# Patient Record
Sex: Male | Born: 1955 | Race: White | Hispanic: No | Marital: Married | Smoking: Current every day smoker
Health system: Southern US, Community
[De-identification: ages and names within clinical notes are randomized; demographics above are authoritative.]

## PROBLEM LIST (undated history)

## (undated) DIAGNOSIS — Z86010 Personal history of colonic polyps: Secondary | ICD-10-CM

## (undated) DIAGNOSIS — G4700 Insomnia, unspecified: Secondary | ICD-10-CM

## (undated) DIAGNOSIS — N138 Other obstructive and reflux uropathy: Secondary | ICD-10-CM

## (undated) DIAGNOSIS — R011 Cardiac murmur, unspecified: Secondary | ICD-10-CM

## (undated) DIAGNOSIS — R9389 Abnormal findings on diagnostic imaging of other specified body structures: Secondary | ICD-10-CM

## (undated) DIAGNOSIS — G8929 Other chronic pain: Secondary | ICD-10-CM

## (undated) DIAGNOSIS — E538 Deficiency of other specified B group vitamins: Secondary | ICD-10-CM

## (undated) DIAGNOSIS — R030 Elevated blood-pressure reading, without diagnosis of hypertension: Secondary | ICD-10-CM

## (undated) DIAGNOSIS — M47816 Spondylosis without myelopathy or radiculopathy, lumbar region: Secondary | ICD-10-CM

## (undated) DIAGNOSIS — R7303 Prediabetes: Secondary | ICD-10-CM

## (undated) DIAGNOSIS — Z720 Tobacco use: Secondary | ICD-10-CM

## (undated) DIAGNOSIS — K5792 Diverticulitis of intestine, part unspecified, without perforation or abscess without bleeding: Secondary | ICD-10-CM

## (undated) DIAGNOSIS — I77810 Thoracic aortic ectasia: Secondary | ICD-10-CM

## (undated) DIAGNOSIS — M1990 Unspecified osteoarthritis, unspecified site: Secondary | ICD-10-CM

## (undated) DIAGNOSIS — T7840XA Allergy, unspecified, initial encounter: Secondary | ICD-10-CM

## (undated) DIAGNOSIS — C4490 Unspecified malignant neoplasm of skin, unspecified: Secondary | ICD-10-CM

## (undated) DIAGNOSIS — E782 Mixed hyperlipidemia: Secondary | ICD-10-CM

## (undated) DIAGNOSIS — I350 Nonrheumatic aortic (valve) stenosis: Secondary | ICD-10-CM

## (undated) DIAGNOSIS — Q231 Congenital insufficiency of aortic valve: Secondary | ICD-10-CM

## (undated) DIAGNOSIS — J449 Chronic obstructive pulmonary disease, unspecified: Secondary | ICD-10-CM

## (undated) DIAGNOSIS — E7800 Pure hypercholesterolemia, unspecified: Secondary | ICD-10-CM

## (undated) DIAGNOSIS — N529 Male erectile dysfunction, unspecified: Secondary | ICD-10-CM

## (undated) DIAGNOSIS — Q2381 Bicuspid aortic valve: Secondary | ICD-10-CM

## (undated) HISTORY — DX: Spondylosis without myelopathy or radiculopathy, lumbar region: M47.816

## (undated) HISTORY — DX: Thoracic aortic ectasia: I77.810

## (undated) HISTORY — DX: Other obstructive and reflux uropathy: N13.8

## (undated) HISTORY — DX: Prediabetes: R73.03

## (undated) HISTORY — DX: Benign prostatic hyperplasia with lower urinary tract symptoms: N13.8

## (undated) HISTORY — DX: Bicuspid aortic valve: Q23.81

## (undated) HISTORY — PX: TONSILLECTOMY: SUR1361

## (undated) HISTORY — DX: Cardiac murmur, unspecified: R01.1

## (undated) HISTORY — DX: Diverticulitis of intestine, part unspecified, without perforation or abscess without bleeding: K57.92

## (undated) HISTORY — DX: Male erectile dysfunction, unspecified: N52.9

## (undated) HISTORY — DX: Abnormal findings on diagnostic imaging of other specified body structures: R93.89

## (undated) HISTORY — DX: Pure hypercholesterolemia, unspecified: E78.00

## (undated) HISTORY — DX: Tobacco use: Z72.0

## (undated) HISTORY — DX: Mixed hyperlipidemia: E78.2

## (undated) HISTORY — DX: Elevated blood-pressure reading, without diagnosis of hypertension: R03.0

## (undated) HISTORY — DX: Nonrheumatic aortic (valve) stenosis: I35.0

## (undated) HISTORY — DX: Other chronic pain: G89.29

## (undated) HISTORY — DX: Allergy, unspecified, initial encounter: T78.40XA

## (undated) HISTORY — DX: Deficiency of other specified B group vitamins: E53.8

## (undated) HISTORY — DX: Personal history of colonic polyps: Z86.010

## (undated) HISTORY — DX: Unspecified osteoarthritis, unspecified site: M19.90

## (undated) HISTORY — PX: HEMORROIDECTOMY: SUR656

## (undated) HISTORY — DX: Chronic obstructive pulmonary disease, unspecified: J44.9

## (undated) HISTORY — DX: Congenital insufficiency of aortic valve: Q23.1

## (undated) HISTORY — PX: RESECTION DISTAL CLAVICAL: SHX5053

## (undated) HISTORY — DX: Unspecified malignant neoplasm of skin, unspecified: C44.90

## (undated) HISTORY — DX: Insomnia, unspecified: G47.00

---

## 1999-01-25 ENCOUNTER — Encounter: Payer: Self-pay | Admitting: Gastroenterology

## 1999-01-25 ENCOUNTER — Ambulatory Visit (HOSPITAL_COMMUNITY): Admission: RE | Admit: 1999-01-25 | Discharge: 1999-01-25 | Payer: Self-pay | Admitting: Gastroenterology

## 1999-04-12 ENCOUNTER — Ambulatory Visit (HOSPITAL_COMMUNITY): Admission: RE | Admit: 1999-04-12 | Discharge: 1999-04-12 | Payer: Self-pay | Admitting: Gastroenterology

## 1999-07-26 ENCOUNTER — Ambulatory Visit (HOSPITAL_COMMUNITY): Admission: RE | Admit: 1999-07-26 | Discharge: 1999-07-26 | Payer: Self-pay | Admitting: *Deleted

## 2001-02-05 ENCOUNTER — Ambulatory Visit (HOSPITAL_COMMUNITY): Admission: RE | Admit: 2001-02-05 | Discharge: 2001-02-05 | Payer: Self-pay | Admitting: Internal Medicine

## 2001-02-05 ENCOUNTER — Encounter: Payer: Self-pay | Admitting: Internal Medicine

## 2002-02-11 ENCOUNTER — Encounter: Payer: Self-pay | Admitting: Internal Medicine

## 2002-02-11 ENCOUNTER — Ambulatory Visit (HOSPITAL_COMMUNITY): Admission: RE | Admit: 2002-02-11 | Discharge: 2002-02-11 | Payer: Self-pay | Admitting: Internal Medicine

## 2003-02-17 ENCOUNTER — Encounter: Payer: Self-pay | Admitting: Internal Medicine

## 2003-02-17 ENCOUNTER — Ambulatory Visit (HOSPITAL_COMMUNITY): Admission: RE | Admit: 2003-02-17 | Discharge: 2003-02-17 | Payer: Self-pay | Admitting: Internal Medicine

## 2003-02-27 ENCOUNTER — Ambulatory Visit (HOSPITAL_BASED_OUTPATIENT_CLINIC_OR_DEPARTMENT_OTHER): Admission: RE | Admit: 2003-02-27 | Discharge: 2003-02-27 | Payer: Self-pay | Admitting: General Surgery

## 2003-02-27 ENCOUNTER — Emergency Department (HOSPITAL_COMMUNITY): Admission: EM | Admit: 2003-02-27 | Discharge: 2003-02-27 | Payer: Self-pay | Admitting: Emergency Medicine

## 2003-02-27 ENCOUNTER — Encounter (INDEPENDENT_AMBULATORY_CARE_PROVIDER_SITE_OTHER): Payer: Self-pay | Admitting: Specialist

## 2004-03-01 ENCOUNTER — Ambulatory Visit (HOSPITAL_COMMUNITY): Admission: RE | Admit: 2004-03-01 | Discharge: 2004-03-01 | Payer: Self-pay | Admitting: Internal Medicine

## 2005-03-16 ENCOUNTER — Ambulatory Visit (HOSPITAL_COMMUNITY): Admission: RE | Admit: 2005-03-16 | Discharge: 2005-03-16 | Payer: Self-pay | Admitting: Internal Medicine

## 2005-12-19 LAB — HM COLONOSCOPY

## 2006-12-06 ENCOUNTER — Encounter: Admission: RE | Admit: 2006-12-06 | Discharge: 2006-12-06 | Payer: Self-pay | Admitting: Internal Medicine

## 2007-08-21 ENCOUNTER — Ambulatory Visit: Payer: Self-pay

## 2007-08-21 ENCOUNTER — Ambulatory Visit: Payer: Self-pay | Admitting: Cardiology

## 2008-04-04 ENCOUNTER — Encounter: Payer: Self-pay | Admitting: Cardiology

## 2010-03-11 ENCOUNTER — Encounter: Admission: RE | Admit: 2010-03-11 | Discharge: 2010-03-11 | Payer: Self-pay | Admitting: Gastroenterology

## 2010-04-22 ENCOUNTER — Encounter: Admission: RE | Admit: 2010-04-22 | Discharge: 2010-04-22 | Payer: Self-pay | Admitting: Gastroenterology

## 2010-05-10 ENCOUNTER — Ambulatory Visit (HOSPITAL_COMMUNITY): Admission: RE | Admit: 2010-05-10 | Discharge: 2010-05-10 | Payer: Self-pay | Admitting: Surgery

## 2010-12-03 ENCOUNTER — Ambulatory Visit (HOSPITAL_COMMUNITY)
Admission: RE | Admit: 2010-12-03 | Discharge: 2010-12-03 | Payer: Self-pay | Source: Home / Self Care | Attending: Internal Medicine | Admitting: Internal Medicine

## 2010-12-19 HISTORY — PX: COLON SURGERY: SHX602

## 2010-12-23 LAB — PROTIME-INR
INR: 0.96 (ref 0.00–1.49)
Prothrombin Time: 13 seconds (ref 11.6–15.2)

## 2010-12-23 LAB — URINALYSIS, ROUTINE W REFLEX MICROSCOPIC
Bilirubin Urine: NEGATIVE
Hemoglobin, Urine: NEGATIVE
Ketones, ur: NEGATIVE mg/dL
Nitrite: NEGATIVE
Protein, ur: NEGATIVE mg/dL
Specific Gravity, Urine: 1.027 (ref 1.005–1.030)
Urine Glucose, Fasting: NEGATIVE mg/dL
Urobilinogen, UA: 0.2 mg/dL (ref 0.0–1.0)
pH: 5.5 (ref 5.0–8.0)

## 2010-12-23 LAB — CBC
HCT: 46.6 % (ref 39.0–52.0)
Hemoglobin: 16.3 g/dL (ref 13.0–17.0)
MCH: 32.7 pg (ref 26.0–34.0)
MCHC: 35 g/dL (ref 30.0–36.0)
MCV: 93.6 fL (ref 78.0–100.0)
Platelets: 168 10*3/uL (ref 150–400)
RBC: 4.98 MIL/uL (ref 4.22–5.81)
RDW: 12.4 % (ref 11.5–15.5)
WBC: 5.3 10*3/uL (ref 4.0–10.5)

## 2010-12-23 LAB — COMPREHENSIVE METABOLIC PANEL
ALT: 23 U/L (ref 0–53)
AST: 20 U/L (ref 0–37)
Albumin: 3.9 g/dL (ref 3.5–5.2)
Alkaline Phosphatase: 61 U/L (ref 39–117)
BUN: 15 mg/dL (ref 6–23)
CO2: 30 mEq/L (ref 19–32)
Calcium: 9.7 mg/dL (ref 8.4–10.5)
Chloride: 105 mEq/L (ref 96–112)
Creatinine, Ser: 1.41 mg/dL (ref 0.4–1.5)
GFR calc Af Amer: >60 mL/min (ref >60)
GFR calc non Af Amer: 52 mL/min — ABNORMAL LOW (ref >60)
Glucose, Bld: 106 mg/dL — ABNORMAL HIGH (ref 70–99)
Potassium: 4.7 mEq/L (ref 3.5–5.1)
Sodium: 140 mEq/L (ref 135–145)
Total Bilirubin: 0.6 mg/dL (ref 0.3–1.2)
Total Protein: 6.7 g/dL (ref 6.0–8.3)

## 2010-12-23 LAB — DIFFERENTIAL
Basophils Absolute: 0 10*3/uL (ref 0.0–0.1)
Basophils Relative: 0 % (ref 0–1)
Eosinophils Absolute: 0.2 10*3/uL (ref 0.0–0.7)
Eosinophils Relative: 3 % (ref 0–5)
Lymphocytes Relative: 19 % (ref 12–46)
Lymphs Abs: 1 10*3/uL (ref 0.7–4.0)
Monocytes Absolute: 0.7 10*3/uL (ref 0.1–1.0)
Monocytes Relative: 13 % — ABNORMAL HIGH (ref 3–12)
Neutro Abs: 3.4 10*3/uL (ref 1.7–7.7)
Neutrophils Relative %: 64 % (ref 43–77)

## 2010-12-23 LAB — URINE MICROSCOPIC-ADD ON

## 2010-12-30 ENCOUNTER — Inpatient Hospital Stay (HOSPITAL_COMMUNITY)
Admission: RE | Admit: 2010-12-30 | Discharge: 2011-01-04 | Payer: Self-pay | Source: Home / Self Care | Attending: Surgery | Admitting: Surgery

## 2010-12-30 ENCOUNTER — Encounter (INDEPENDENT_AMBULATORY_CARE_PROVIDER_SITE_OTHER): Payer: Self-pay | Admitting: Surgery

## 2011-01-03 LAB — CBC
HCT: 42.9 % (ref 39.0–52.0)
Hemoglobin: 14.8 g/dL (ref 13.0–17.0)
MCH: 31.9 pg (ref 26.0–34.0)
MCHC: 34.5 g/dL (ref 30.0–36.0)
MCV: 92.5 fL (ref 78.0–100.0)
Platelets: 156 10*3/uL (ref 150–400)
RBC: 4.64 MIL/uL (ref 4.22–5.81)
RDW: 12.2 % (ref 11.5–15.5)
WBC: 9.7 10*3/uL (ref 4.0–10.5)

## 2011-01-03 LAB — BASIC METABOLIC PANEL
BUN: 15 mg/dL (ref 6–23)
CO2: 27 mEq/L (ref 19–32)
Calcium: 8.6 mg/dL (ref 8.4–10.5)
Chloride: 105 mEq/L (ref 96–112)
Creatinine, Ser: 1.31 mg/dL (ref 0.4–1.5)
GFR calc Af Amer: >60 mL/min (ref >60)
GFR calc non Af Amer: 57 mL/min — ABNORMAL LOW (ref >60)
Glucose, Bld: 163 mg/dL — ABNORMAL HIGH (ref 70–99)
Potassium: 4.6 mEq/L (ref 3.5–5.1)
Sodium: 138 mEq/L (ref 135–145)

## 2011-01-03 LAB — TYPE AND SCREEN
ABO/RH(D): O POS
Antibody Screen: NEGATIVE

## 2011-01-03 LAB — ABO/RH: ABO/RH(D): O POS

## 2011-01-09 ENCOUNTER — Encounter: Payer: Self-pay | Admitting: Gastroenterology

## 2011-01-09 NOTE — Op Note (Signed)
[name], [name] NO.:  [id number]  MEDICAL RECORD NO.:  [id number]          PATIENT TYPE:  INP  LOCATION:  0001                         FACILITY:  [facility]  PHYSICIAN:  Velora Heckler, MD      DATE OF BIRTH:  04-21-1956  DATE OF PROCEDURE:  12/30/2010 DATE OF DISCHARGE:                              OPERATIVE REPORT   PREOPERATIVE DIAGNOSES: 1. Sigmoid diverticular disease. 2. Abdominal pain.  POSTOPERATIVE DIAGNOSES: 1. Sigmoid diverticular disease. 2. Abdominal pain.  PROCEDURE:  Sigmoid colectomy.  SURGEON:  Velora Heckler, M.D.  ASSISTANT:  Karie Soda, M.D.  ANESTHESIA:  General per Dr. Eilene Ghazi.  ESTIMATED BLOOD LOSS:  Minimal.  PREPARATION:  Chlor-prep.  COMPLICATIONS:  None.  INDICATIONS:  Patient is a 55 year old white male referred by Dr. Ritta Slot for chronic intermittent sigmoid diverticular disease.  Patient has had multiple episodes of diverticulitis treated successfully with antibiotics and bowel rest.  He has had no complications.  However, patient now presents for sigmoid colectomy for management of sigmoid diverticular disease.  BODY OF REPORT:  Procedure is done in OR #1 at the [facility] - .  Patient is brought to the operating room, placed in a supine position on the operating room table.  Following administration of general anesthesia, the patient is positioned and then prepped and draped in the usual strict aseptic fashion.  After ascertaining that an adequate level of anesthesia had been achieved, a midline abdominal incision was made with a #10 blade.  Dissection is carried down through subcutaneous tissues.  Hemostasis was obtained with electrocautery. Fascia is incised in the midline and the peritoneal cavity is entered cautiously.  Abdomen is explored.  There is a focal segment of diverticular disease in the distal half of the sigmoid colon extending to near the peritoneal reflection.  This  portion of the bowel is adhered and tethered at the pelvic brim.  It appears to be narrowed and partially obstructive.  There does not appear to be any complications such as perforation.  Remainder of the colon appears grossly normal. Rectum appears normal.  Small bowel is normal.  Exploration in the upper abdomen shows a normal stomach, and an orogastric tube is positioned within the stomach.  Antrum and pylorus appear grossly normal.  Spleen is normal.  Liver appears normal.  There are a few small clear cysts measuring less than 1 cm palpable in the right hepatic lobe. Gallbladder appears normal.  There are a few adhesions of lesser omentum to the undersurface of the gallbladder.  There are no stones on palpation.  Balfour retractor is placed for exposure.  Sigmoid colon is mobilized from its lateral peritoneal attachments using the electrocautery. Descending colon is mobilized along the lateral white line up to the splenic flexure.  Splenic flexure is not mobilized.  A point at the junction of the descending colon and proximal sigmoid colon is selected and transected with a GIA stapler.  Mesentery is divided using the Ethicon Enseal.  Dissection is carried distally to the pelvic peritoneal reflection.  Stay sutures are placed on either side of the proximal rectum.  Remaining mesentery is divided.  Bowel is then divided between bowel clamps and the specimen is passed off the field and submitted to pathology for review.  Next, the bowel is prepared for end-to-end anastomosis.  Previous staple line in the distal descending colon is resected.  An end-to-end anastomosis is created with a single layer of interrupted 3-0 silk sutures.  There is no tension on the anastomosis.  Tissues appear healthy and viable.  Good hemostasis is noted.  All bowel clamps are removed.  Mesenteric defect is closed with interrupted 3-0 silk sutures.  Abdomen is irrigated copiously with warm saline.  All  packs are removed. Retractors are removed.  Midline incision is closed with interrupted #1 Vicryl simple sutures.  Subcutaneous tissues are irrigated.  Skin is closed with stainless steel staples.  Wound is washed and dried and sterile dressings are applied.  The patient is awakened from anesthesia and brought to the recovery room.  The patient tolerated the procedure well.     Velora Heckler, MD     TMG/MEDQ  D:  12/30/2010  T:  12/30/2010  Job:  [id number]  cc:   Lovenia Kim, D.O. Fax: [phone]  Griffith Citron, M.D. Fax: [phone]  Velora Heckler, MD [phone] [address] [place] [address]  Electronically Signed by Darnell Level MD on 01/09/2011 10:01:22 AM

## 2011-01-13 LAB — SURGICAL PCR SCREEN
MRSA, PCR: NEGATIVE
Staphylococcus aureus: NEGATIVE

## 2011-01-17 NOTE — Discharge Summary (Signed)
Anthony Paul, Anthony Paul NO.:  [id number]  MEDICAL RECORD NO.:  [id number]          PATIENT TYPE:  INP  LOCATION:  1528                         FACILITY:  [facility]  PHYSICIAN:  Ardeth Sportsman, MD     DATE OF BIRTH:  July 01, 1956  DATE OF ADMISSION:  12/30/2010 DATE OF DISCHARGE:  01/04/2011                              DISCHARGE SUMMARY   PRIMARY CARE PHYSICIAN:  Lovenia Kim, D.O.  GASTROENTEROLOGIST:  Griffith Citron, M.D.  SURGEON:  Velora Heckler, M.D.  PRINCIPAL DIAGNOSIS:  Sigmoid diverticulosis with recurrent bouts of diverticulitis.  PRINCIPAL PROCEDURE PERFORMED:  Open sigmoid colectomy on December 30, 2010.  Pathology shows sigmoid diverticular disease with no malignancy and benign pericolonic lymph nodes.  OTHER DIAGNOSES: 1. Lichen planus. 2. Question of chronic obstructive pulmonary disease, on inhaler,     stable disease. 3. Gastroesophageal reflux disease. 4. Insomnia.  DISCHARGE MEDICATIONS: 1. Spiriva 18 mcg daily. 2. Enteric-coated aspirin 81 mg daily. 3. Ice q.2h. p.r.n. pain. 4. Heat q.2h. p.r.n. pain. 5. Tylenol, ibuprofen, or Aleve over-the-counter p.r.n. pain. 6. Vicodin 5/500 one to two p.o. q.4h. p.r.n. pain.  HOSPITAL COURSE:  Anthony Paul is a 55 year old male with recurrent bouts of diverticulitis for the past few years.  He has had extensive workup that rules out other etiologies for his abdominal pain.  He underwent open colectomy after seeing Dr. Gerrit Friends on December 30, 2010.  Postoperatively, he was placed on anti-ileus protocol.  He was started on liquid diet.  He began to ambulate.  He had difficulty with initial narcotic but was switched over to Dilaudid which worked better for him. He began to have flatus.  He had a small bowel movement on postop day #3.  He was advanced on his diet.  By the time of discharge, he was walking well on the hallways.  He had good pain control on oral medications.  He is tolerating  solid food pretty well.  He had had consistent flatus.  He denied any nausea or vomiting.  He was sleeping better.  He was more comfortable.  He wished to go home and I think that was reasonable on postop day #5 with the following instructions: 1. He is to return to clinic to see Dr. Gerrit Friends in about 2 weeks. 2. He should call if he has worsening fevers, chills, sweats, nausea,     vomiting, uncontrolled pain, diarrhea, etc. 3. He should call if he has any drainage from his incision or other     concerns.  By the time of discharge, his staples have been     gradually completely removed to fully Steri-Strips. 4. He should resume his home medications as noted above. 5. He should walk an hour a day for good physical activity and take     fiber regularly for good bowel regimen to avoid extremes of     constipation or diarrhea. 6. He should call if he has further issues.     Ardeth Sportsman, MD     SCG/MEDQ  D:  01/04/2011  T:  01/04/2011  Job:  [id number]  cc:  Lovenia Kim, D.O. Fax: [phone]  Griffith Citron, M.D. Fax: [phone]  Electronically Signed by Karie Soda MD on 01/17/2011 12:33:34 PM

## 2011-05-03 NOTE — Procedures (Signed)
[facility]                              EXERCISE TREADMILL   NAME:Yeatts, CLARY MEEKER                       MRN:          [id number]  DATE:08/21/2007                            DOB:          1956-08-25    INDICATION FOR STUDY:  786.05.   Baseline blood pressure was 127/79 with a pulse of 89 and regular.  Baseline ECG was normal.   The patient exercised on Bruce protocol for a total of 12 minutes.  MET  level achieved was 11.7.  Peak heart rate was 162 which is 96% of  predicted maximum heart rate.   Blood pressure increased to 180/81.  He developed shortness of breath  and fatigue.  There were no EKG changes.   ASSESSMENT:  1. Negative adequate exercise treadmill.  2. Relatively good exercise tolerance.  3. Normal blood pressure response to exercise.     Thomas C. Daleen Squibb, MD, [facility]  Electronically Signed    TCW/MedQ  DD: 08/21/2007  DT: 08/21/2007  Job #: [id number]   cc:   Lovenia Kim, D.O.

## 2011-05-06 NOTE — Op Note (Signed)
NAME:  Anthony Paul, Anthony Paul NO.:  [id number]   MEDICAL RECORD NO.:  [id number]                   PATIENT TYPE:  AMB   LOCATION:  DSC                                  FACILITY:  [facility]   PHYSICIAN:  Anselm Pancoast. Zachery Dakins, M.D.          DATE OF BIRTH:  1956/04/24   DATE OF PROCEDURE:  02/27/2003  DATE OF DISCHARGE:                                 OPERATIVE REPORT   PREOPERATIVE DIAGNOSIS:  Internal and external hemorrhoids.   PROCEDURE:  Internal and external hemorrhoidectomy.   ANESTHESIA:  General anesthesia, lithotomy position.   SURGEON:  Anselm Pancoast. Zachery Dakins, M.D.   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  He  said he has had hemorrhoids for numerous years.  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 Arlyce Dice, M.D., and had a  colonoscopy and rubber band of one hemorrhoid, but Dr. Arlyce Dice advised him  that it was not improving things and he has waited until this time before  surgical repair.  On exam he has large internal and external hemorrhoids  that prolapse with straining.  He is otherwise healthy, and I have  recommended a formal internal and external hemorrhoidectomy be performed.  The patient was in agreement.  He had a bottle of magnesium citrate  preoperatively, and he was also given 1 g of Kefzol immediately  preoperatively.   DESCRIPTION OF PROCEDURE:  The patient was taken to the operative suite,  induction of general anesthesia, placed up in the stirrups, and then the  anal area was prepped with Betadine surgical scrub and solution and draped  in a sterile manner.  I prepped the internal area also.  The perianal area  was infiltrated with a mixture of 0.5% Marcaine with Xylocaine at 20 mL  total used in all quadrants.  Next the two  largest hemorrhoids were the  right anterior and the left lateral.  It appeared the rubber band had been  placed on the right posterolateral, and I basically elevated using the  bullet retractor, elevated the external hemorrhoid, the pedicle was  dissected from the internal sphincter, I finally clamped it with a Buie  clamp, and then sutured this with a couple of U-stitches with 2-0 chromic  and then start at the apex, running over the clamp, removing the clamp,  pulling the sutures, tying and then closing the external portion with the  running 2-0 chromic.  Next the right posterolateral was done, but I just  elevated this and removed the hemorrhoid and sutured the area, since the  hemorrhoid was nothing nearly as large as the first area.  Next the left  lateral, and it was the largest complex, was treated in a similar  manner,  elevated it from the sphincter under clamp with a Buie clamp, and then  sutured it with running 2-0 chromic.  After the first three or four  stitches, U-stitches had been done for hemostasis.  You could admit two  fingers at the completion of surgery, and I inspected all suture lines and  they were no evidence of bleeding.  I then took Xylocaine ointment within a  sponge and sort of placed it in the anal canal, so hopefully it will give  him some continued relief for the first five or six hours and then he can  remove  the packing this evening.  The patient tolerated the procedure, was  extubated and taken to the recovery room in a stable postop condition.  We  will see him back in the office in a week, and he should be able to return  to work in approximately 10 days.                                               Anselm Pancoast. Zachery Dakins, M.D.    WJW/MEDQ  D:  02/27/2003  T:  02/27/2003  Job:  [id number]   cc:   Lovenia Kim, D.O.  [address]  [place]  [place] [address]  Fax: [phone]

## 2011-05-23 ENCOUNTER — Other Ambulatory Visit: Payer: Self-pay | Admitting: Internal Medicine

## 2011-05-23 DIAGNOSIS — R195 Other fecal abnormalities: Secondary | ICD-10-CM

## 2011-05-27 ENCOUNTER — Other Ambulatory Visit: Payer: Self-pay

## 2011-05-30 ENCOUNTER — Ambulatory Visit
Admission: RE | Admit: 2011-05-30 | Discharge: 2011-05-30 | Disposition: A | Discharge status: Home / Self Care | Payer: BC Managed Care – PPO | Source: Ambulatory Visit | Attending: Internal Medicine | Admitting: Internal Medicine

## 2011-05-30 DIAGNOSIS — R195 Other fecal abnormalities: Secondary | ICD-10-CM

## 2011-05-30 MED ORDER — IOHEXOL 300 MG/ML  SOLN
125.0000 mL | Freq: Once | INTRAMUSCULAR | Status: AC | PRN
  Administered 2011-05-30: 125 mL via INTRAVENOUS

## 2011-07-18 ENCOUNTER — Ambulatory Visit (INDEPENDENT_AMBULATORY_CARE_PROVIDER_SITE_OTHER): Payer: BC Managed Care – PPO | Admitting: Critical Care Medicine

## 2011-07-18 ENCOUNTER — Encounter: Payer: Self-pay | Admitting: Critical Care Medicine

## 2011-07-18 ENCOUNTER — Ambulatory Visit (INDEPENDENT_AMBULATORY_CARE_PROVIDER_SITE_OTHER)
Admission: RE | Admit: 2011-07-18 | Discharge: 2011-07-18 | Disposition: A | Discharge status: Home / Self Care | Payer: BC Managed Care – PPO | Source: Ambulatory Visit | Attending: Critical Care Medicine | Admitting: Critical Care Medicine

## 2011-07-18 DIAGNOSIS — J449 Chronic obstructive pulmonary disease, unspecified: Secondary | ICD-10-CM

## 2011-07-18 DIAGNOSIS — K5732 Diverticulitis of large intestine without perforation or abscess without bleeding: Secondary | ICD-10-CM

## 2011-07-18 DIAGNOSIS — K5792 Diverticulitis of intestine, part unspecified, without perforation or abscess without bleeding: Secondary | ICD-10-CM

## 2011-07-18 NOTE — Progress Notes (Signed)
Subjective:    Patient ID: Anthony Paul, male    DOB: [birth year], 55 y.o.   MRN: [id number]  Shortness of Breath This is a chronic problem. The problem occurs daily (worse as day goes on). The problem has been gradually worsening. Associated symptoms include abdominal pain. Pertinent negatives include no chest pain, ear pain, fever, headaches, leg swelling, neck pain, orthopnea, PND, rash, rhinorrhea, sore throat, sputum production, vomiting or wheezing. The symptoms are aggravated by any activity and eating. He has tried ipratropium inhalers for the symptoms. The treatment provided no relief (spiriva has not helped). His past medical history is significant for COPD. There is no history of allergies, bronchiolitis, CAD, DVT, a heart failure, PE or pneumonia.   55 y.o.WM C/o lower abd pain d/t diverticulitis.  Lower chest . Had surgery in 1/12.  Still with pain in abdomen.  Notes some DOE, Ok walking distances.  Pt is not very active.    Past Medical History  Diagnosis Date  . Diverticulitis   . COPD (chronic obstructive pulmonary disease)      Family History  Problem Relation Age of Onset  . Heart disease Father      History   Social History  . Marital Status: Married    Spouse Name: N/A    Number of Children: 3  . Years of Education: N/A   Occupational History  . [employer]   Social History Main Topics  . Smoking status: Current Some Day Smoker -- 0.5 packs/day    Types: Cigarettes  . Smokeless tobacco: Former Neurosurgeon    Types: Chew    Quit date: 12/19/2006   Comment: Started smoking at age 80  . Alcohol Use: Yes     1-2 drinks per day  . Drug Use: No  . Sexually Active: Not on file   Other Topics Concern  . Not on file   Social History Narrative  . No narrative on file     No Known Allergies   No outpatient prescriptions prior to visit.      Review of Systems  Constitutional: Negative for fever, chills, diaphoresis, activity change, appetite change, fatigue  and unexpected weight change.  HENT: Positive for congestion and mouth sores. Negative for hearing loss, ear pain, nosebleeds, sore throat, facial swelling, rhinorrhea, sneezing, trouble swallowing, neck pain, neck stiffness, dental problem, voice change, postnasal drip, sinus pressure, tinnitus and ear discharge.   Eyes: Positive for itching. Negative for photophobia, discharge and visual disturbance.  Respiratory: Positive for shortness of breath. Negative for apnea, cough, sputum production, choking, chest tightness, wheezing and stridor.   Cardiovascular: Negative for chest pain, palpitations, orthopnea, leg swelling and PND.  Gastrointestinal: Positive for abdominal pain and abdominal distention. Negative for nausea, vomiting, constipation and blood in stool.  Genitourinary: Positive for flank pain. Negative for dysuria, urgency, frequency, hematuria, decreased urine volume and difficulty urinating.  Musculoskeletal: Negative for myalgias, back pain, joint swelling, arthralgias and gait problem.  Skin: Negative for color change, pallor and rash.  Neurological: Negative for dizziness, tremors, seizures, syncope, speech difficulty, weakness, light-headedness, numbness and headaches.  Hematological: Negative for adenopathy. Does not bruise/bleed easily.  Psychiatric/Behavioral: Negative for confusion, sleep disturbance and agitation. The patient is nervous/anxious.        Objective:   Physical Exam  Filed Vitals:   07/18/11 1432  BP: 126/80  Pulse: 80  Temp: 98.5 F (36.9 C)  TempSrc: Oral  Height: 6\' 1"  (1.854 m)  Weight: 221 lb (100.245 kg)  SpO2: 96%    Gen: Pleasant, well-nourished, in no distress,  normal affect  ENT: No lesions,  mouth clear,  oropharynx clear, no postnasal drip  Neck: No JVD, no TMG, no carotid bruits  Lungs: No use of accessory muscles, no dullness to percussion, distant BS  Cardiovascular: RRR, heart sounds normal, no murmur or gallops, no peripheral  edema  Abdomen: soft and NT, no HSM,  BS normal  Musculoskeletal: No deformities, no cyanosis or clubbing  Neuro: alert, non focal  Skin: Warm, no lesions or rashes       Assessment & Plan:   COPD (chronic obstructive pulmonary disease) Mild Golds 0 Copd Plan Cont spiriva Note CXR shows copd changes Focus on smoking cessation

## 2011-07-18 NOTE — Patient Instructions (Signed)
Focus on smoking cessation,  Use Nicoderm CQ 21mg  daily then taper per program, may supplement with Nicorette minis 4mg  as needed Stay on Spiriva for now A Chest xray will be obtained.  We may decide to do a chest CT scan depending on the Chest xray results I will call with results Return 6 weeks

## 2011-07-20 ENCOUNTER — Telehealth: Payer: Self-pay | Admitting: Critical Care Medicine

## 2011-07-20 NOTE — Assessment & Plan Note (Addendum)
Mild Golds 0 Copd Plan Cont spiriva Note CXR shows copd changes Focus on smoking cessation

## 2011-07-20 NOTE — Progress Notes (Signed)
Quick Note:  Call the pt and tell him CXR ok, no acute findings ______

## 2011-07-20 NOTE — Telephone Encounter (Signed)
Spoke with pt and notified of recs per PW regarding cxr. Pt verbalized understanding.

## 2011-07-21 NOTE — Progress Notes (Signed)
Quick Note:  Per phone message from 07/20/11, pt aware of cxr results per PW. ______

## 2011-09-05 ENCOUNTER — Ambulatory Visit (INDEPENDENT_AMBULATORY_CARE_PROVIDER_SITE_OTHER): Payer: BC Managed Care – PPO | Admitting: Critical Care Medicine

## 2011-09-05 ENCOUNTER — Encounter: Payer: Self-pay | Admitting: Critical Care Medicine

## 2011-09-05 VITALS — BP 114/76 | HR 88 | Temp 98.2°F | Ht 73.0 in | Wt 222.2 lb

## 2011-09-05 DIAGNOSIS — J449 Chronic obstructive pulmonary disease, unspecified: Secondary | ICD-10-CM

## 2011-09-05 NOTE — Progress Notes (Signed)
Subjective:    Patient ID: Anthony Paul, male    DOB: 22-Dec-1955, 55 y.o.   MRN: [id number]  Shortness of Breath This is a chronic problem. The problem occurs daily (worse as day goes on). The problem has been gradually worsening. Associated symptoms include abdominal pain. Pertinent negatives include no chest pain, ear pain, fever, headaches, leg swelling, neck pain, orthopnea, PND, rash, rhinorrhea, sore throat, sputum production, vomiting or wheezing. The symptoms are aggravated by any activity and eating. He has tried ipratropium inhalers for the symptoms. The treatment provided no relief (spiriva has not helped). His past medical history is significant for COPD. There is no history of allergies, bronchiolitis, CAD, DVT, a heart failure, PE or pneumonia.   55 y.o.WM C/o lower abd pain d/t diverticulitis.  Lower chest . Had surgery in 1/12.  Still with pain in abdomen.  Notes some DOE, Ok walking distances.  Pt is not very active.    09/05/2011 No real change compared to 07/18/11.  Still smoking 1/2PPD.    Past Medical History  Diagnosis Date  . Diverticulitis   . COPD (chronic obstructive pulmonary disease)      Family History  Problem Relation Age of Onset  . Heart disease Father      History   Social History  . Marital Status: Married    Spouse Name: N/A    Number of Children: 3  . Years of Education: N/A   Occupational History  . [employer]   Social History Main Topics  . Smoking status: Current Some Day Smoker -- 0.5 packs/day    Types: Cigarettes  . Smokeless tobacco: Former Neurosurgeon    Types: Chew    Quit date: 12/19/2006   Comment: Started smoking at age 66  . Alcohol Use: Yes     1-2 drinks per day  . Drug Use: No  . Sexually Active: Not on file   Other Topics Concern  . Not on file   Social History Narrative  . No narrative on file     No Known Allergies   Outpatient Prescriptions Prior to Visit  Medication Sig Dispense Refill  . aspirin 81 MG tablet  Take 162 mg by mouth daily.        Marland Kitchen SPIRIVA HANDIHALER 18 MCG inhalation capsule Once daily          Review of Systems  Constitutional: Negative for fever, chills, diaphoresis, activity change, appetite change, fatigue and unexpected weight change.  HENT: Positive for congestion and mouth sores. Negative for hearing loss, ear pain, nosebleeds, sore throat, facial swelling, rhinorrhea, sneezing, trouble swallowing, neck pain, neck stiffness, dental problem, voice change, postnasal drip, sinus pressure, tinnitus and ear discharge.   Eyes: Positive for itching. Negative for photophobia, discharge and visual disturbance.  Respiratory: Positive for shortness of breath. Negative for apnea, cough, sputum production, choking, chest tightness, wheezing and stridor.   Cardiovascular: Negative for chest pain, palpitations, orthopnea, leg swelling and PND.  Gastrointestinal: Positive for abdominal pain and abdominal distention. Negative for nausea, vomiting, constipation and blood in stool.  Genitourinary: Positive for flank pain. Negative for dysuria, urgency, frequency, hematuria, decreased urine volume and difficulty urinating.  Musculoskeletal: Negative for myalgias, back pain, joint swelling, arthralgias and gait problem.  Skin: Negative for color change, pallor and rash.  Neurological: Negative for dizziness, tremors, seizures, syncope, speech difficulty, weakness, light-headedness, numbness and headaches.  Hematological: Negative for adenopathy. Does not bruise/bleed easily.  Psychiatric/Behavioral: Negative for confusion, sleep disturbance and agitation. The  patient is nervous/anxious.        Objective:   Physical Exam   Filed Vitals:   09/05/11 1341  BP: 114/76  Pulse: 88  Temp: 98.2 F (36.8 C)  TempSrc: Oral  Height: 6\' 1"  (1.854 m)  Weight: 222 lb 3.2 oz (100.789 kg)  SpO2: 98%    Gen: Pleasant, well-nourished, in no distress,  normal affect  ENT: No lesions,  mouth clear,   oropharynx clear, no postnasal drip  Neck: No JVD, no TMG, no carotid bruits  Lungs: No use of accessory muscles, no dullness to percussion, distant BS  Cardiovascular: RRR, heart sounds normal, no murmur or gallops, no peripheral edema  Abdomen: soft and NT, no HSM,  BS normal  Musculoskeletal: No deformities, no cyanosis or clubbing  Neuro: alert, non focal  Skin: Warm, no lesions or rashes       Assessment & Plan:   COPD (chronic obstructive pulmonary disease) 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

## 2011-09-05 NOTE — Assessment & Plan Note (Signed)
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

## 2011-09-05 NOTE — Patient Instructions (Signed)
Focus on smoking cessation , use Nicoderm CQ 21mg  patch and follow the step program, may use nicorette minis 2mg  as needed up to 4 daily on top of the patch Ok to stay off spiriva Return as needed

## 2012-05-07 ENCOUNTER — Ambulatory Visit (HOSPITAL_COMMUNITY)
Admission: RE | Admit: 2012-05-07 | Discharge: 2012-05-07 | Disposition: A | Discharge status: Home / Self Care | Payer: BC Managed Care – PPO | Source: Ambulatory Visit | Attending: Internal Medicine | Admitting: Internal Medicine

## 2012-05-07 ENCOUNTER — Other Ambulatory Visit (HOSPITAL_COMMUNITY): Payer: Self-pay | Admitting: Internal Medicine

## 2012-05-07 DIAGNOSIS — R05 Cough: Secondary | ICD-10-CM

## 2012-05-07 DIAGNOSIS — J449 Chronic obstructive pulmonary disease, unspecified: Secondary | ICD-10-CM | POA: Insufficient documentation

## 2012-05-07 DIAGNOSIS — R0602 Shortness of breath: Secondary | ICD-10-CM | POA: Insufficient documentation

## 2012-05-07 DIAGNOSIS — R059 Cough, unspecified: Secondary | ICD-10-CM | POA: Insufficient documentation

## 2012-05-07 DIAGNOSIS — J4489 Other specified chronic obstructive pulmonary disease: Secondary | ICD-10-CM | POA: Insufficient documentation

## 2012-05-07 DIAGNOSIS — R071 Chest pain on breathing: Secondary | ICD-10-CM | POA: Insufficient documentation

## 2012-05-22 ENCOUNTER — Other Ambulatory Visit: Payer: Self-pay | Admitting: Internal Medicine

## 2012-05-22 DIAGNOSIS — J449 Chronic obstructive pulmonary disease, unspecified: Secondary | ICD-10-CM

## 2012-05-28 ENCOUNTER — Ambulatory Visit
Admission: RE | Admit: 2012-05-28 | Discharge: 2012-05-28 | Disposition: A | Discharge status: Home / Self Care | Payer: BC Managed Care – PPO | Source: Ambulatory Visit | Attending: Internal Medicine | Admitting: Internal Medicine

## 2012-05-28 DIAGNOSIS — J449 Chronic obstructive pulmonary disease, unspecified: Secondary | ICD-10-CM

## 2012-05-28 MED ORDER — IOHEXOL 300 MG/ML  SOLN
75.0000 mL | Freq: Once | INTRAMUSCULAR | Status: AC | PRN
  Administered 2012-05-28: 75 mL via INTRAVENOUS

## 2012-06-07 ENCOUNTER — Other Ambulatory Visit (HOSPITAL_COMMUNITY): Payer: Self-pay | Admitting: Internal Medicine

## 2012-06-07 DIAGNOSIS — Q231 Congenital insufficiency of aortic valve: Secondary | ICD-10-CM

## 2012-06-07 DIAGNOSIS — Q2381 Bicuspid aortic valve: Secondary | ICD-10-CM

## 2012-06-11 ENCOUNTER — Ambulatory Visit (HOSPITAL_COMMUNITY): Payer: BC Managed Care – PPO | Attending: Cardiology

## 2012-06-11 DIAGNOSIS — I359 Nonrheumatic aortic valve disorder, unspecified: Secondary | ICD-10-CM | POA: Insufficient documentation

## 2012-06-11 DIAGNOSIS — Q2381 Bicuspid aortic valve: Secondary | ICD-10-CM

## 2012-06-11 DIAGNOSIS — Q231 Congenital insufficiency of aortic valve: Secondary | ICD-10-CM | POA: Insufficient documentation

## 2012-06-11 NOTE — Progress Notes (Signed)
Echocardiogram performed.  

## 2012-06-12 ENCOUNTER — Encounter (HOSPITAL_COMMUNITY): Payer: Self-pay | Admitting: Internal Medicine

## 2012-06-27 ENCOUNTER — Ambulatory Visit (INDEPENDENT_AMBULATORY_CARE_PROVIDER_SITE_OTHER): Payer: BC Managed Care – PPO | Admitting: Cardiovascular Disease

## 2012-06-27 ENCOUNTER — Encounter: Payer: Self-pay | Admitting: *Deleted

## 2012-06-27 VITALS — BP 130/70 | HR 82 | Resp 18 | Ht 73.0 in | Wt 221.8 lb

## 2012-06-27 DIAGNOSIS — E782 Mixed hyperlipidemia: Secondary | ICD-10-CM | POA: Insufficient documentation

## 2012-06-27 DIAGNOSIS — R030 Elevated blood-pressure reading, without diagnosis of hypertension: Secondary | ICD-10-CM | POA: Insufficient documentation

## 2012-06-27 DIAGNOSIS — R9389 Abnormal findings on diagnostic imaging of other specified body structures: Secondary | ICD-10-CM

## 2012-06-27 DIAGNOSIS — Z79899 Other long term (current) drug therapy: Secondary | ICD-10-CM

## 2012-06-27 DIAGNOSIS — R931 Abnormal findings on diagnostic imaging of heart and coronary circulation: Secondary | ICD-10-CM

## 2012-06-27 DIAGNOSIS — R0600 Dyspnea, unspecified: Secondary | ICD-10-CM

## 2012-06-27 DIAGNOSIS — I719 Aortic aneurysm of unspecified site, without rupture: Secondary | ICD-10-CM

## 2012-06-27 DIAGNOSIS — R0609 Other forms of dyspnea: Secondary | ICD-10-CM

## 2012-06-27 DIAGNOSIS — Q231 Congenital insufficiency of aortic valve: Secondary | ICD-10-CM | POA: Insufficient documentation

## 2012-06-27 LAB — BASIC METABOLIC PANEL
BUN: 22 mg/dL (ref 6–23)
Chloride: 104 mEq/L (ref 96–112)
Creatinine, Ser: 1.4 mg/dL (ref 0.4–1.5)
GFR: 57.81 mL/min — ABNORMAL LOW (ref >60.00)
Glucose, Bld: 126 mg/dL — ABNORMAL HIGH (ref 70–99)
Potassium: 3.5 mEq/L (ref 3.5–5.1)

## 2012-06-27 NOTE — Assessment & Plan Note (Signed)
Low sodium diet F/U primary

## 2012-06-27 NOTE — Assessment & Plan Note (Signed)
Long discussion with patient regarding diagnosis, natural history and prognosis.  Needs ETT to assess functional capacity and R/O CAD.  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

## 2012-06-27 NOTE — Patient Instructions (Signed)
Your physician wants you to follow-up in:  6 MONTHS WITH DR Haywood Filler will receive a reminder letter in the mail two months in advance. If you don't receive a letter, please call our office to schedule the follow-up appointment. Your physician recommends that you continue on your current medications as directed. Please refer to the Current Medication list given to you today. Your physician has requested that you have an exercise tolerance test. For further information please visit [email or web address] Please also follow instruction sheet, as given. DX ABN ECHO Your physician has requested that you have a cardiac MRI. Cardiac MRI uses a computer to create images of your heart as its beating, producing both still and moving pictures of your heart and major blood vessels. For further information please visit [email or web address]. Please follow the instruction sheet given to you today for more information. DX ABN ECHO Your physician recommends that you return for lab work in: TODAY BMET  DX PRIOR TO MRI

## 2012-06-27 NOTE — Progress Notes (Signed)
Patient ID: Anthony Paul, male   DOB: 28-Mar-1956, 56 y.o.   MRN: [id number] 56 yo referred by Dr Oneta Rack for murmur.  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  He indicates seeing Dr Maylon Cos and having echo but these records not available.  Had CT scan for some lower lung and abdominal pain and valve noted to be calcified.  Echo reviewed  EF 55-60%  Mild to moderate AR mild AS with mean gradient 18 mmHg and peak of 25 mmHg.  ? Bicuspid valve with dilated aortic root 4.0 cm.  Patient has mild exertional dyspnea.  Some muscular sounding chest pain. No palpitations or syncope. Sees dentist twice/yearr.  Previous smoker.  CRF elevated BP and cholesterol  ROS: Denies fever, malais, weight loss, blurry vision, decreased visual acuity, cough, sputum, SOB, hemoptysis, pleuritic pain, palpitaitons, heartburn, abdominal pain, melena, lower extremity edema, claudication, or rash.  All other systems reviewed and negative   General: Affect appropriate Healthy:  appears stated age HEENT: normal Neck supple with no adenopathy JVP normal no bruits no thyromegaly Lungs clear with no wheezing and good diaphragmatic motion Heart:  S1/S2 midl AS/AR  Murmur no ,rub, gallop or click PMI normal Abdomen: benighn, BS positve, no tenderness, no AAA no bruit.  No HSM or HJR Distal pulses intact with no bruits No edema Neuro non-focal Skin warm and dry No muscular weakness  Medications Current Outpatient Prescriptions  Medication Sig Dispense Refill  . aspirin 81 MG tablet Take 162 mg by mouth daily.        . clidinium-chlordiazePOXIDE (LIBRAX) 2.5-5 MG per capsule Take 1 capsule by mouth. 2-3 times daily        Allergies Review of patient's allergies indicates no known allergies.  Family History: Family History  Problem Relation Age of Onset  . Heart disease Father     Social History: History   Social History  . Marital Status: Married    Spouse Name: N/A   Number of Children: 3  . Years of Education: N/A   Occupational History  . DRIVER [employer]   Social History Main Topics  . Smoking status: Current Some Day Smoker -- 0.5 packs/day    Types: Cigarettes  . Smokeless tobacco: Former Neurosurgeon    Types: Chew    Quit date: 12/19/2006   Comment: Started smoking at age 89  . Alcohol Use: Yes     1-2 drinks per day  . Drug Use: No  . Sexually Active: Not on file   Other Topics Concern  . Not on file   Social History Narrative  . No narrative on file    Electrocardiogram:  NSR rate 82 normal ECG  Assessment and Plan

## 2012-06-27 NOTE — Addendum Note (Signed)
Addended by: Tonita Phoenix on: 06/27/2012 09:24 AM   Modules accepted: Orders

## 2012-06-27 NOTE — Assessment & Plan Note (Signed)
Likey related to recent bronchitis and previous smoking.  F/U ETT  AS AR not bad enough to contribute

## 2012-06-27 NOTE — Assessment & Plan Note (Signed)
Low cholesterol diet F/U primary Some literature to suggest decreased stenotic progression on statin

## 2012-06-29 ENCOUNTER — Encounter: Payer: Self-pay | Admitting: Cardiovascular Disease

## 2012-07-03 ENCOUNTER — Ambulatory Visit (HOSPITAL_COMMUNITY)
Admission: RE | Admit: 2012-07-03 | Discharge: 2012-07-03 | Disposition: A | Discharge status: Home / Self Care | Payer: BC Managed Care – PPO | Source: Ambulatory Visit | Attending: Cardiovascular Disease | Admitting: Cardiovascular Disease

## 2012-07-03 DIAGNOSIS — Q231 Congenital insufficiency of aortic valve: Secondary | ICD-10-CM

## 2012-07-03 DIAGNOSIS — I359 Nonrheumatic aortic valve disorder, unspecified: Secondary | ICD-10-CM

## 2012-07-03 DIAGNOSIS — R931 Abnormal findings on diagnostic imaging of heart and coronary circulation: Secondary | ICD-10-CM

## 2012-07-03 DIAGNOSIS — R9389 Abnormal findings on diagnostic imaging of other specified body structures: Secondary | ICD-10-CM | POA: Insufficient documentation

## 2012-07-03 MED ORDER — GADOBENATE DIMEGLUMINE 529 MG/ML IV SOLN
25.0000 mL | Freq: Once | INTRAVENOUS | Status: AC
  Administered 2012-07-03: 25 mL via INTRAVENOUS

## 2012-07-09 ENCOUNTER — Telehealth: Payer: Self-pay | Admitting: Cardiovascular Disease

## 2012-07-09 NOTE — Telephone Encounter (Signed)
Pt rtn call from friday °

## 2012-07-09 NOTE — Telephone Encounter (Signed)
SEE MRI MRA RESULT NOTE  PT AWARE  OF TEST RESULTS./CY

## 2012-07-23 ENCOUNTER — Encounter: Payer: Self-pay | Admitting: Physician Assistant

## 2012-07-23 ENCOUNTER — Ambulatory Visit (INDEPENDENT_AMBULATORY_CARE_PROVIDER_SITE_OTHER): Payer: BC Managed Care – PPO | Admitting: Physician Assistant

## 2012-07-23 DIAGNOSIS — R9389 Abnormal findings on diagnostic imaging of other specified body structures: Secondary | ICD-10-CM

## 2012-07-23 DIAGNOSIS — R931 Abnormal findings on diagnostic imaging of heart and coronary circulation: Secondary | ICD-10-CM

## 2012-07-23 NOTE — Procedures (Signed)
Exercise Treadmill Test  Pre-Exercise Testing Evaluation Rhythm: normal sinus  Rate: 74   PR:  .13 QRS:  .09  QT:  .38 QTc: .42     Test  Exercise Tolerance Test Ordering MD: Charlton Haws, MD  Interpreting MD: Tereso Newcomer , PA-C  Unique Test No: 1  Treadmill:  1  Indication for ETT: Abnormal ECHO  Contraindication to ETT: No   Stress Modality: exercise - treadmill  Cardiac Imaging Performed: non   Protocol: standard Bruce - maximal  Max BP: 166/73  Max MPHR (bpm):  163 85% MPR (bpm):  140  MPHR obtained (bpm):  148 % MPHR obtained:  90%  Reached 85% MPHR (min:sec):  8:00 Total Exercise Time (min-sec):  9:00  Workload in METS:  9.9 Borg Scale: 17  Reason ETT Terminated:  patient's desire to stop    ST Segment Analysis At Rest: normal ST segments - no evidence of significant ST depression With Exercise: no evidence of significant ST depression  Other Information Arrhythmia:  No Angina during ETT:  absent (0) Quality of ETT:  diagnostic  ETT Interpretation:  normal - no evidence of ischemia by ST analysis  Comments: Good exercise tolerance. No chest pain. Normal BP response to exercise. No ST-T changes to suggest ischemia.   Recommendations: Follow up with Dr. Charlton Haws as directed. Tereso Newcomer, PA-C  11:17 AM 07/23/2012

## 2013-01-11 ENCOUNTER — Telehealth: Payer: Self-pay | Admitting: Cardiovascular Disease

## 2013-01-11 NOTE — Telephone Encounter (Signed)
Pt calling back upset this has not been done, says his job depends on this paperwork, and needs it done asap

## 2013-01-11 NOTE — Telephone Encounter (Signed)
Or cell [phone] for the next hour, works nights will be going to sleep,ok to leave message, calling re paperwork re DOT physical that was to be faxed to [facility], left here Wednesday at front desk

## 2013-01-11 NOTE — Telephone Encounter (Signed)
SPOKE WITH PT  REVIEWED FORM THAT WAS DROPPED OFF AND DID  NOT SEE ANYWHERE FOR MD TO SIGN  DID HAVE CHECKED  REQUESTING ECHO  AND  EVAL  WITH DETAILS OF CURRENT SYMPTOMS  ID FAX  LAST OFFICE NOTE, ECHO,AND MRI  TO [facility] FAX NUMBER ON FORM PT AWARE   ABOVE SENT VIA FAX.  INSTRUCTED PT  THAT IF THIS WAS WHAT  WAS NOT NEEDED  TO CALL OFFICE BACK. PT AGREES./CY

## 2013-01-14 ENCOUNTER — Telehealth: Payer: Self-pay | Admitting: Cardiovascular Disease

## 2013-01-14 NOTE — Telephone Encounter (Signed)
New Problem    Status of Paperwork physical from [facility]. Pt would like to receive a call back today. Pt states information is urgent and current job is on the line.

## 2013-01-14 NOTE — Telephone Encounter (Signed)
PT CALLED THIS AM STATED [facility] DID NOT RECEIVE INFO FAXED ON Friday PT STATES HE WILL PICK UP INFO COPIES MADE AND LEFT AT FRONT DESK FOR PICK UP .Anthony Paul

## 2013-08-05 ENCOUNTER — Ambulatory Visit (INDEPENDENT_AMBULATORY_CARE_PROVIDER_SITE_OTHER): Payer: BC Managed Care – PPO | Admitting: Cardiovascular Disease

## 2013-08-05 ENCOUNTER — Encounter: Payer: Self-pay | Admitting: Cardiovascular Disease

## 2013-08-05 VITALS — BP 138/68 | HR 80 | Ht 73.0 in | Wt 227.0 lb

## 2013-08-05 DIAGNOSIS — R030 Elevated blood-pressure reading, without diagnosis of hypertension: Secondary | ICD-10-CM

## 2013-08-05 DIAGNOSIS — Q231 Congenital insufficiency of aortic valve: Secondary | ICD-10-CM

## 2013-08-05 NOTE — Patient Instructions (Signed)

## 2013-08-05 NOTE — Progress Notes (Signed)
Patient ID: Anthony Paul, male   DOB: 09/24/1956, 57 y.o.   MRN: [id number] 57 yo referred by Dr Oneta Rack for murmur. 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 He indicates seeing Dr Maylon Cos and having echo but these records not available. Had CT scan for some lower lung and abdominal pain and valve noted to be calcified. 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 ? Bicuspid valve with dilated aortic root 4.0 cm. Patient has mild exertional dyspnea. Some muscular sounding chest pain. No palpitations or syncope. Sees dentist twice/yearr. Previous smoker. 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  All other systems reviewed and negative  General: Affect appropriate Healthy:  appears stated age HEENT: normal Neck supple with no adenopathy JVP normal no bruits no thyromegaly Lungs clear with no wheezing and good diaphragmatic motion Heart:  S1/S2 AS/AR murmur, no rub, gallop or click PMI normal Abdomen: benighn, BS positve, no tenderness, no AAA no bruit.  No HSM or HJR Distal pulses intact with no bruits No edema Neuro non-focal Skin warm and dry No muscular weakness   Current Outpatient Prescriptions  Medication Sig Dispense Refill  . aspirin 81 MG tablet Take 81 mg by mouth daily.       . Cholecalciferol (VITAMIN D PO) Take 4 tablets by mouth daily.      . Omega-3 Fatty Acids (FISH OIL PO) Take 1 tablet by mouth daily.       No current facility-administered medications for this visit.    Allergies  Review of patient's  allergies indicates no known allergies.  Electrocardiogram: today NSR rate 80 normal   Assessment and Plan

## 2013-08-05 NOTE — Assessment & Plan Note (Signed)
No change in murmur No aneurysm or coarctation Normal ETT 2013  F/U echo

## 2013-08-05 NOTE — Assessment & Plan Note (Signed)
Cholesterol is at goal.  Continue current dose of statin and diet Rx.  No myalgias or side effects.  F/U  LFT's in 6 months. No results found for this basename: LDLCALC  Labs with primary            

## 2013-08-05 NOTE — Assessment & Plan Note (Signed)
Well controlled.  Continue current medications and low sodium Dash type diet.    

## 2013-08-13 ENCOUNTER — Other Ambulatory Visit: Payer: Self-pay | Admitting: Internal Medicine

## 2013-08-13 DIAGNOSIS — R918 Other nonspecific abnormal finding of lung field: Secondary | ICD-10-CM

## 2013-08-16 ENCOUNTER — Other Ambulatory Visit: Payer: BC Managed Care – PPO

## 2013-08-26 ENCOUNTER — Ambulatory Visit (HOSPITAL_COMMUNITY): Payer: BC Managed Care – PPO | Attending: Cardiology

## 2013-08-26 DIAGNOSIS — I10 Essential (primary) hypertension: Secondary | ICD-10-CM | POA: Insufficient documentation

## 2013-08-26 DIAGNOSIS — I080 Rheumatic disorders of both mitral and aortic valves: Secondary | ICD-10-CM | POA: Insufficient documentation

## 2013-08-26 DIAGNOSIS — I079 Rheumatic tricuspid valve disease, unspecified: Secondary | ICD-10-CM | POA: Insufficient documentation

## 2013-08-26 DIAGNOSIS — Z87891 Personal history of nicotine dependence: Secondary | ICD-10-CM | POA: Insufficient documentation

## 2013-08-26 DIAGNOSIS — I359 Nonrheumatic aortic valve disorder, unspecified: Secondary | ICD-10-CM

## 2013-08-26 DIAGNOSIS — I379 Nonrheumatic pulmonary valve disorder, unspecified: Secondary | ICD-10-CM | POA: Insufficient documentation

## 2013-08-26 DIAGNOSIS — Q231 Congenital insufficiency of aortic valve: Secondary | ICD-10-CM | POA: Insufficient documentation

## 2013-08-26 NOTE — Progress Notes (Signed)
Echocardiogram performed.  

## 2013-11-10 ENCOUNTER — Encounter: Payer: Self-pay | Admitting: Internal Medicine

## 2013-11-10 DIAGNOSIS — M1990 Unspecified osteoarthritis, unspecified site: Secondary | ICD-10-CM | POA: Insufficient documentation

## 2013-11-10 DIAGNOSIS — N529 Male erectile dysfunction, unspecified: Secondary | ICD-10-CM | POA: Insufficient documentation

## 2013-11-10 NOTE — Telephone Encounter (Signed)
This encounter was created in error - please disregard.

## 2013-11-11 ENCOUNTER — Ambulatory Visit: Payer: BC Managed Care – PPO | Admitting: Emergency Medicine

## 2013-11-11 ENCOUNTER — Encounter: Payer: Self-pay | Admitting: Emergency Medicine

## 2013-11-11 VITALS — BP 122/80 | HR 80 | Temp 98.2°F | Resp 18 | Ht 73.0 in | Wt 225.0 lb

## 2013-11-11 DIAGNOSIS — IMO0001 Reserved for inherently not codable concepts without codable children: Secondary | ICD-10-CM

## 2013-11-11 DIAGNOSIS — E782 Mixed hyperlipidemia: Secondary | ICD-10-CM

## 2013-11-11 DIAGNOSIS — R109 Unspecified abdominal pain: Secondary | ICD-10-CM

## 2013-11-11 DIAGNOSIS — R7309 Other abnormal glucose: Secondary | ICD-10-CM

## 2013-11-11 LAB — BASIC METABOLIC PANEL WITH GFR
BUN: 17 mg/dL (ref 6–23)
Calcium: 10.2 mg/dL (ref 8.4–10.5)
Creat: 1.33 mg/dL (ref 0.50–1.35)
GFR, Est African American: 68 mL/min
GFR, Est Non African American: 59 mL/min — ABNORMAL LOW
Glucose, Bld: 102 mg/dL — ABNORMAL HIGH (ref 70–99)
Potassium: 4.9 mEq/L (ref 3.5–5.3)

## 2013-11-11 LAB — HEPATIC FUNCTION PANEL
ALT: 19 U/L (ref 0–53)
Albumin: 4.6 g/dL (ref 3.5–5.2)
Total Protein: 6.8 g/dL (ref 6.0–8.3)

## 2013-11-11 LAB — CBC WITH DIFFERENTIAL/PLATELET
Basophils Absolute: 0 10*3/uL (ref 0.0–0.1)
Basophils Relative: 0 % (ref 0–1)
Eosinophils Absolute: 0.2 10*3/uL (ref 0.0–0.7)
HCT: 44.6 % (ref 39.0–52.0)
Hemoglobin: 15.4 g/dL (ref 13.0–17.0)
MCH: 31.8 pg (ref 26.0–34.0)
MCHC: 34.5 g/dL (ref 30.0–36.0)
Monocytes Absolute: 0.7 10*3/uL (ref 0.1–1.0)
Monocytes Relative: 12 % (ref 3–12)
Neutro Abs: 3.4 10*3/uL (ref 1.7–7.7)
RDW: 13.2 % (ref 11.5–15.5)

## 2013-11-11 LAB — LIPID PANEL
Cholesterol: 194 mg/dL (ref 0–200)
HDL: 31 mg/dL — ABNORMAL LOW (ref >39)
Triglycerides: 542 mg/dL — ABNORMAL HIGH (ref <150)

## 2013-11-11 LAB — CK: Total CK: 46 U/L (ref 7–232)

## 2013-11-11 NOTE — Patient Instructions (Signed)
Fat and Cholesterol Control Diet  Fat and cholesterol levels in your blood and organs are influenced by your diet. High levels of fat and cholesterol may lead to diseases of the heart, small and large blood vessels, gallbladder, liver, and pancreas.  CONTROLLING FAT AND CHOLESTEROL WITH DIET  Although exercise and lifestyle factors are important, your diet is key. That is because certain foods are known to raise cholesterol and others to lower it. The goal is to balance foods for their effect on cholesterol and more importantly, to replace saturated and trans fat with other types of fat, such as monounsaturated fat, polyunsaturated fat, and omega-3 fatty acids.  On average, a person should consume no more than 15 to 17 g of saturated fat daily. Saturated and trans fats are considered "bad" fats, and they will raise LDL cholesterol. Saturated fats are primarily found in animal products such as meats, butter, and cream. However, that does not mean you need to give up all your favorite foods. Today, there are good tasting, low-fat, low-cholesterol substitutes for most of the things you like to eat. Choose low-fat or nonfat alternatives. Choose round or loin cuts of red meat. These types of cuts are lowest in fat and cholesterol. Chicken (without the skin), fish, veal, and ground turkey breast are great choices. Eliminate fatty meats, such as hot dogs and salami. Even shellfish have little or no saturated fat. Have a 3 oz (85 g) portion when you eat lean meat, poultry, or fish.  Trans fats are also called "partially hydrogenated oils." They are oils that have been scientifically manipulated so that they are solid at room temperature resulting in a longer shelf life and improved taste and texture of foods in which they are added. Trans fats are found in stick margarine, some tub margarines, cookies, crackers, and baked goods.   When baking and cooking, oils are a great substitute for butter. The monounsaturated oils are  especially beneficial since it is believed they lower LDL and raise HDL. The oils you should avoid entirely are saturated tropical oils, such as coconut and palm.   Remember to eat a lot from food groups that are naturally free of saturated and trans fat, including fish, fruit, vegetables, beans, grains (barley, rice, couscous, bulgur wheat), and pasta (without cream sauces).   IDENTIFYING FOODS THAT LOWER FAT AND CHOLESTEROL   Soluble fiber may lower your cholesterol. This type of fiber is found in fruits such as apples, vegetables such as broccoli, potatoes, and carrots, legumes such as beans, peas, and lentils, and grains such as barley. Foods fortified with plant sterols (phytosterol) may also lower cholesterol. You should eat at least 2 g per day of these foods for a cholesterol lowering effect.   Read package labels to identify low-saturated fats, trans fat free, and low-fat foods at the supermarket. Select cheeses that have only 2 to 3 g saturated fat per ounce. Use a heart-healthy tub margarine that is free of trans fats or partially hydrogenated oil. When buying baked goods (cookies, crackers), avoid partially hydrogenated oils. Breads and muffins should be made from whole grains (whole-wheat or whole oat flour, instead of "flour" or "enriched flour"). Buy non-creamy canned soups with reduced salt and no added fats.   FOOD PREPARATION TECHNIQUES   Never deep-fry. If you must fry, either stir-fry, which uses very little fat, or use non-stick cooking sprays. When possible, broil, bake, or roast meats, and steam vegetables. Instead of putting butter or margarine on vegetables, use lemon   and herbs, applesauce, and cinnamon (for squash and sweet potatoes). Use nonfat yogurt, salsa, and low-fat dressings for salads.   LOW-SATURATED FAT / LOW-FAT FOOD SUBSTITUTES  Meats / Saturated Fat (g)  · Avoid: Steak, marbled (3 oz/85 g) / 11 g  · Choose: Steak, lean (3 oz/85 g) / 4 g  · Avoid: Hamburger (3 oz/85 g) / 7  g  · Choose: Hamburger, lean (3 oz/85 g) / 5 g  · Avoid: Ham (3 oz/85 g) / 6 g  · Choose: Ham, lean cut (3 oz/85 g) / 2.4 g  · Avoid: Chicken, with skin, dark meat (3 oz/85 g) / 4 g  · Choose: Chicken, skin removed, dark meat (3 oz/85 g) / 2 g  · Avoid: Chicken, with skin, light meat (3 oz/85 g) / 2.5 g  · Choose: Chicken, skin removed, light meat (3 oz/85 g) / 1 g  Dairy / Saturated Fat (g)  · Avoid: Whole milk (1 cup) / 5 g  · Choose: Low-fat milk, 2% (1 cup) / 3 g  · Choose: Low-fat milk, 1% (1 cup) / 1.5 g  · Choose: Skim milk (1 cup) / 0.3 g  · Avoid: Hard cheese (1 oz/28 g) / 6 g  · Choose: Skim milk cheese (1 oz/28 g) / 2 to 3 g  · Avoid: Cottage cheese, 4% fat (1 cup) / 6.5 g  · Choose: Low-fat cottage cheese, 1% fat (1 cup) / 1.5 g  · Avoid: Ice cream (1 cup) / 9 g  · Choose: Sherbet (1 cup) / 2.5 g  · Choose: Nonfat frozen yogurt (1 cup) / 0.3 g  · Choose: Frozen fruit bar / trace  · Avoid: Whipped cream (1 tbs) / 3.5 g  · Choose: Nondairy whipped topping (1 tbs) / 1 g  Condiments / Saturated Fat (g)  · Avoid: Mayonnaise (1 tbs) / 2 g  · Choose: Low-fat mayonnaise (1 tbs) / 1 g  · Avoid: Butter (1 tbs) / 7 g  · Choose: Extra light margarine (1 tbs) / 1 g  · Avoid: Coconut oil (1 tbs) / 11.8 g  · Choose: Olive oil (1 tbs) / 1.8 g  · Choose: Corn oil (1 tbs) / 1.7 g  · Choose: Safflower oil (1 tbs) / 1.2 g  · Choose: Sunflower oil (1 tbs) / 1.4 g  · Choose: Soybean oil (1 tbs) / 2.4 g  · Choose: Canola oil (1 tbs) / 1 g  Document Released: 12/05/2005 Document Revised: 04/01/2013 Document Reviewed: 05/26/2011  Exit[employer]® Patient Information ©2014 [employer].

## 2013-11-12 LAB — INSULIN, FASTING: Insulin fasting, serum: 16 u[IU]/mL (ref 3–28)

## 2013-11-12 NOTE — Progress Notes (Signed)
Subjective:    Patient ID: Anthony Paul, male    DOB: 1956/08/19, 57 y.o.   MRN: [id number]  HPI Comments: 57 yo male presents for labile HTN, cholesterol, pre-dm f/u. He has been eating healthy for the most part. Exercises regularly. He has noticed mild myalgias with out significant triggers. LAST LABS T192 TG 308 H 32 L 98 A1C 5.9 His BP has been good, he is not on any RX for HTN.  He still has upper abdomen discomfort without trigger. He has had CT chest and abdomen without specific cause of symptoms noted. He denies radiation of pain just notes it is uncomfortable on/ off. He denies any other symptoms associated with abdomen or chest.  Hypertension  Hyperlipidemia Associated symptoms include myalgias.    Current Outpatient Prescriptions on File Prior to Visit  Medication Sig Dispense Refill  . aspirin 81 MG tablet Take 81 mg by mouth daily.       . Cholecalciferol (VITAMIN D PO) Take 4 tablets by mouth daily.      . fenofibrate micronized (LOFIBRA) 134 MG capsule Take 134 mg by mouth daily before breakfast.      . Omega-3 Fatty Acids (FISH OIL PO) Take 1 tablet by mouth daily.      . sildenafil (VIAGRA) 100 MG tablet Take 100 mg by mouth daily as needed for erectile dysfunction.      . traZODone (DESYREL) 150 MG tablet Take by mouth at bedtime. Takes 1/2       No current facility-administered medications on file prior to visit.    Review of patient's allergies indicates no known allergies.  Past Medical History  Diagnosis Date  . Diverticulitis   . Skin cancer   . Hypercholesteremia   . COPD (chronic obstructive pulmonary disease)   . ED (erectile dysfunction)   . DJD (degenerative joint disease)   . Bicuspid aortic valve   . Elevated blood pressure reading without diagnosis of hypertension      Review of Systems  Gastrointestinal: Positive for abdominal pain.  Musculoskeletal: Positive for myalgias.  All other systems reviewed and are negative.    BP 122/80   Pulse 80  Temp(Src) 98.2 F (36.8 C) (Temporal)  Resp 18  Ht 6\' 1"  (1.854 m)  Wt 225 lb (102.059 kg)  BMI 29.69 kg/m2     Objective:   Physical Exam  Nursing note and vitals reviewed. Constitutional: He is oriented to person, place, and time. He appears well-developed and well-nourished.  HENT:  Head: Normocephalic and atraumatic.  Right Ear: External ear normal.  Left Ear: External ear normal.  Nose: Nose normal.  Mouth/Throat: Oropharynx is clear and moist.  Eyes: Pupils are equal, round, and reactive to light.  Neck: Normal range of motion. No thyromegaly present.  Cardiovascular: Normal rate, regular rhythm, normal heart sounds and intact distal pulses.   Pulmonary/Chest: Effort normal and breath sounds normal.  Abdominal: Soft. Bowel sounds are normal. He exhibits no distension and no mass. There is no tenderness. There is no rebound and no guarding.  Musculoskeletal: Normal range of motion.  Lymphadenopathy:    He has no cervical adenopathy.  Neurological: He is alert and oriented to person, place, and time.  Skin: Skin is warm and dry.  Psychiatric: Thought content normal.  Flat affect          Assessment & Plan:  1. 3 month F/U for labile HTN, Cholesterol, Pre-Dm, D. Deficient- check labs, improve diet and exercise. 2. Myalgias- check labs  needs more regular exercise and add stretching 3. Chronic abdomen pain with neg evaluations in past, check labs may need evaluation with GI vs GB HIDA scan vs concern for depression with flat affect. W/C if symptoms increase

## 2013-11-13 LAB — ALDOLASE: Aldolase: 5.4 U/L (ref <=8.1)

## 2013-11-15 ENCOUNTER — Ambulatory Visit (INDEPENDENT_AMBULATORY_CARE_PROVIDER_SITE_OTHER): Payer: BC Managed Care – PPO | Admitting: Critical Care Medicine

## 2013-11-15 ENCOUNTER — Encounter: Payer: Self-pay | Admitting: Critical Care Medicine

## 2013-11-15 VITALS — BP 126/84 | HR 85 | Temp 98.1°F | Ht 73.0 in | Wt 228.0 lb

## 2013-11-15 DIAGNOSIS — J449 Chronic obstructive pulmonary disease, unspecified: Secondary | ICD-10-CM

## 2013-11-15 DIAGNOSIS — J439 Emphysema, unspecified: Secondary | ICD-10-CM

## 2013-11-15 DIAGNOSIS — J438 Other emphysema: Secondary | ICD-10-CM

## 2013-11-15 DIAGNOSIS — Z23 Encounter for immunization: Secondary | ICD-10-CM

## 2013-11-15 NOTE — Assessment & Plan Note (Signed)
History of mild chronic obstructive lung disease with FEV1 100% of predicted in 2012 now with complaints of discomfort under the diaphragm and in the mid abdomen which may not be referable to the respiratory tract The patient's pulmonary exam is essentially normal however there is tenderness underneath both ribs and both the right and left upper quadrant however there is also tenderness in the right lower quadrant as well. Recent CT scan of the abdomen showed diverticulosis but no evidence of diverticulitis. CT scan of chest was also unrevealing. Plan Obtain another set of pulmonary function studies to assess lung function at this time The patient was recommended to seek gastroenterology evaluation of diverticulosis and abdominal pain No medication changes at this time were offered

## 2013-11-15 NOTE — Progress Notes (Signed)
Subjective:    Patient ID: Anthony Paul, male    DOB: 11/01/1956, 57 y.o.   MRN: [id number]  HPI 11/15/2013 Chief Complaint  Patient presents with  . Follow-up    last seen 09-05-2011. Pt c/o having pain under both rib cages. Pt c/o some SOB with activity that is worse then last visit.   Pt with pain under both ribs, seems worse. Abd pain and goes up into the ribs.  Right under rib cage , R>L worse Better with a deep breath. No back pain or neck pain.  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 May be sl more dyspneic. No smoking since 07/2013.  No cough or mucus. No bowel changes.  No heartburn symptoms. No n/v Note hx of diverticulitis and s/p resection  Review of Systems  Constitutional: Positive for fatigue. Negative for fever, chills and appetite change.  HENT: Negative.   Eyes: Negative.   Respiratory: Positive for cough, chest tightness and shortness of breath. Negative for choking and wheezing.   Cardiovascular: Positive for chest pain. Negative for palpitations and leg swelling.  Gastrointestinal: Positive for abdominal pain and abdominal distention. Negative for nausea, vomiting, diarrhea, constipation, blood in stool, anal bleeding and rectal pain.       Feels bloated and gassy  Endocrine: Negative.   Genitourinary: Negative.   Musculoskeletal: Negative.   Skin: Negative.   Allergic/Immunologic: Negative.   Neurological: Negative.   Hematological: Negative.   Psychiatric/Behavioral: Negative.        Objective:   Physical Exam Filed Vitals:   11/15/13 1018  BP: 126/84  Pulse: 85  Temp: 98.1 F (36.7 C)  TempSrc: Oral  Height: 6\' 1"  (1.854 m)  Weight: 228 lb (103.42 kg)  SpO2: 99%    Gen: Pleasant, well-nourished, in no distress,  normal affect  ENT: No lesions,  mouth clear,  oropharynx clear, no postnasal drip  Neck: No JVD, no TMG, no carotid bruits  Lungs: No use of accessory muscles, no dullness to percussion, clear without rales or rhonchi  Cardiovascular:  RRR, heart sounds normal, no murmur or gallops, no peripheral edema  Abdomen: soft mild tenderness without rebound guarding or fullness seen in both right and left upper quadrant and right lower quadrant, no HSM,  BS normal  Musculoskeletal: No deformities, no cyanosis or clubbing  Neuro: alert, non focal  Skin: Warm, no lesions or rashes  No results found.        Assessment & Plan:   COPD (chronic obstructive pulmonary disease) History of mild chronic obstructive lung disease with FEV1 100% of predicted in 2012 now with complaints of discomfort under the diaphragm and in the mid abdomen which may not be referable to the respiratory tract The patient's pulmonary exam is essentially normal however there is tenderness underneath both ribs and both the right and left upper quadrant however there is also tenderness in the right lower quadrant as well. Recent CT scan of the abdomen showed diverticulosis but no evidence of diverticulitis. CT scan of chest was also unrevealing. Plan Obtain another set of pulmonary function studies to assess lung function at this time The patient was recommended to seek gastroenterology evaluation of diverticulosis and abdominal pain No medication changes at this time were offered   Updated Medication List Outpatient Encounter Prescriptions as of 11/15/2013  Medication Sig  . aspirin 81 MG tablet Take 81 mg by mouth daily.   . Cholecalciferol (VITAMIN D PO) Take 4 tablets by mouth daily.  . fenofibrate micronized (  LOFIBRA) 134 MG capsule Take 134 mg by mouth daily before breakfast.  . Flaxseed, Linseed, (FLAXSEED OIL) 1200 MG CAPS Take 1 capsule by mouth daily.  . Omega-3 Fatty Acids (FISH OIL PO) Take 1 tablet by mouth daily.  . traZODone (DESYREL) 150 MG tablet Take by mouth at bedtime. Takes 1/2  . [DISCONTINUED] sildenafil (VIAGRA) 100 MG tablet Take 100 mg by mouth daily as needed for erectile dysfunction.

## 2013-11-15 NOTE — Patient Instructions (Signed)
Pulmonary function studies will be obtained Pneumovax and flu vaccine given Get your abdomen evaluated by Dr Kinnie Scales

## 2013-11-18 ENCOUNTER — Telehealth: Payer: Self-pay | Admitting: *Deleted

## 2013-11-18 NOTE — Telephone Encounter (Signed)
Spoke with patient about lab results and instructions.  Pt will work on diet and exercise to lower Trig and BS readings.

## 2013-11-18 NOTE — Telephone Encounter (Signed)
Message copied by Nicholaus Corolla A on Mon Nov 18, 2013 11:50 AM ------      Message from: Berenice Primas      Created: Sat Nov 16, 2013  9:33 PM       TG, A1c, BS elevated need better diet, decrease ETOH (if Drinks), decrease fried/ carbs/ sugars. Need increase H2o. Muscle enzymes normal, need to increase exercise. Recheck at 2/15 OV. ------

## 2013-11-25 ENCOUNTER — Telehealth: Payer: Self-pay | Admitting: Cardiovascular Disease

## 2013-11-25 NOTE — Telephone Encounter (Signed)
New Message  Pt called states that he is experiencing pain under rib cage and wants to be sure that it is heart related// made appt for 12/12 @ 11 am// Please call if sooner appt is needed//

## 2013-11-25 NOTE — Telephone Encounter (Signed)
Has COPD and history of diverticulitis Does not sound cardiac f/u primary and GI

## 2013-11-25 NOTE — Telephone Encounter (Signed)
SPOKE WITH   PT  IN GREAT  LENGTH HAS  C/O  ABDOMEN  AND   RIB  CAGE  PAIN  FOR  APPROX  YEAR  FORGOT TO MENTION  AT  LAST OFFICE   VISIT , PULMONARY  HAS  SEEN AND  REFERRED TO   GI  DR  MEDOFF  FEELS  MAYBE  GI  RELATED  BUT  ALSO  SUGGEST  TO  F/U WITH   CARDIOLOGISTS  NOTES  PAIN GETS  WORSE  AT  THE END  OF  SHIFT  AT  WORK   ALSO  MAY  PUSH ON AREA  WITH SOME  RELIEF  AND  CAN  RAISE  ARMS   UP  WITH  SOME  SIGNS OF  IMPROVEMENT  WILL FORWARD TO DR Eden Emms   FOR  REVIEW  .Anthony Paul

## 2013-11-27 NOTE — Telephone Encounter (Signed)
PT  AWARE./CY 

## 2013-11-29 ENCOUNTER — Ambulatory Visit: Payer: BC Managed Care – PPO | Admitting: Cardiovascular Disease

## 2013-12-13 ENCOUNTER — Telehealth: Payer: Self-pay | Admitting: Critical Care Medicine

## 2013-12-13 NOTE — Telephone Encounter (Signed)
I called and spoke with pt. He r/s his appt to 12/23/13 at 11 AM over at [facility]. Nothing further needed

## 2013-12-16 ENCOUNTER — Encounter (HOSPITAL_COMMUNITY): Payer: BC Managed Care – PPO

## 2013-12-19 DIAGNOSIS — Z860100 Personal history of colon polyps, unspecified: Secondary | ICD-10-CM

## 2013-12-19 HISTORY — PX: UPPER GI ENDOSCOPY: SHX6162

## 2013-12-19 HISTORY — PX: INGUINAL HERNIA REPAIR: SHX194

## 2013-12-19 HISTORY — DX: Personal history of colonic polyps: Z86.010

## 2013-12-19 HISTORY — DX: Personal history of colon polyps, unspecified: Z86.0100

## 2013-12-23 ENCOUNTER — Ambulatory Visit (HOSPITAL_COMMUNITY)
Admission: RE | Admit: 2013-12-23 | Discharge: 2013-12-23 | Disposition: A | Discharge status: Home / Self Care | Payer: BC Managed Care – PPO | Source: Ambulatory Visit | Attending: Critical Care Medicine | Admitting: Critical Care Medicine

## 2013-12-23 DIAGNOSIS — J439 Emphysema, unspecified: Secondary | ICD-10-CM

## 2013-12-23 DIAGNOSIS — R0609 Other forms of dyspnea: Secondary | ICD-10-CM | POA: Insufficient documentation

## 2013-12-23 DIAGNOSIS — Z87891 Personal history of nicotine dependence: Secondary | ICD-10-CM | POA: Insufficient documentation

## 2013-12-23 DIAGNOSIS — R0989 Other specified symptoms and signs involving the circulatory and respiratory systems: Principal | ICD-10-CM | POA: Insufficient documentation

## 2013-12-23 LAB — PULMONARY FUNCTION TEST
DL/VA % PRED: 62 %
DL/VA: 2.98 ml/min/mmHg/L
DLCO UNC % PRED: 72 %
DLCO UNC: 26.3 ml/min/mmHg
FEF 25-75 POST: 3.61 L/s
FEF 25-75 PRE: 2.92 L/s
FEF2575-%Change-Post: 23 %
FEF2575-%PRED-PRE: 88 %
FEF2575-%Pred-Post: 109 %
FEV1-%Change-Post: 5 %
FEV1-%PRED-PRE: 99 %
FEV1-%Pred-Post: 105 %
FEV1-POST: 4.27 L
FEV1-Pre: 4.04 L
FEV1FVC-%CHANGE-POST: 6 %
FEV1FVC-%Pred-Pre: 96 %
FEV6-%CHANGE-POST: 0 %
FEV6-%PRED-POST: 107 %
FEV6-%PRED-PRE: 106 %
FEV6-Post: 5.45 L
FEV6-Pre: 5.43 L
FEV6FVC-%Change-Post: 0 %
FEV6FVC-%PRED-POST: 103 %
FEV6FVC-%Pred-Pre: 102 %
FVC-%CHANGE-POST: 0 %
FVC-%PRED-POST: 103 %
FVC-%Pred-Pre: 103 %
FVC-Post: 5.48 L
FVC-Pre: 5.51 L
PRE FEV1/FVC RATIO: 73 %
Post FEV1/FVC ratio: 78 %
Post FEV6/FVC ratio: 99 %
Pre FEV6/FVC Ratio: 98 %
RV % pred: 131 %
RV: 3.13 L
TLC % pred: 117 %
TLC: 8.96 L

## 2013-12-23 MED ORDER — ALBUTEROL SULFATE (2.5 MG/3ML) 0.083% IN NEBU
2.5000 mg | INHALATION_SOLUTION | Freq: Once | RESPIRATORY_TRACT | Status: AC
  Administered 2013-12-23: 2.5 mg via RESPIRATORY_TRACT

## 2013-12-24 NOTE — Progress Notes (Signed)
Quick Note:  lmomtcb for pt ______ 

## 2013-12-25 NOTE — Progress Notes (Signed)
Quick Note:  lmomtcb ______ 

## 2013-12-26 ENCOUNTER — Telehealth: Payer: Self-pay | Admitting: Critical Care Medicine

## 2013-12-26 NOTE — Telephone Encounter (Signed)
Please call the pt to tell him his PFTs were completely normal No evidence for primary lung disease  I spoke with patient about results and he verbalized understanding and had no questions

## 2014-01-13 HISTORY — PX: COLONOSCOPY: SHX174

## 2014-01-13 LAB — HM COLONOSCOPY

## 2014-01-20 ENCOUNTER — Encounter: Payer: Self-pay | Admitting: Physician Assistant

## 2014-01-20 ENCOUNTER — Ambulatory Visit (INDEPENDENT_AMBULATORY_CARE_PROVIDER_SITE_OTHER): Payer: BC Managed Care – PPO | Admitting: Physician Assistant

## 2014-01-20 VITALS — BP 110/68 | HR 80 | Temp 98.6°F | Resp 16 | Ht 73.0 in | Wt 228.0 lb

## 2014-01-20 DIAGNOSIS — R0781 Pleurodynia: Secondary | ICD-10-CM

## 2014-01-20 DIAGNOSIS — Z0000 Encounter for general adult medical examination without abnormal findings: Secondary | ICD-10-CM

## 2014-01-20 DIAGNOSIS — I10 Essential (primary) hypertension: Secondary | ICD-10-CM

## 2014-01-20 DIAGNOSIS — N3000 Acute cystitis without hematuria: Secondary | ICD-10-CM

## 2014-01-20 DIAGNOSIS — Z125 Encounter for screening for malignant neoplasm of prostate: Secondary | ICD-10-CM

## 2014-01-20 DIAGNOSIS — E559 Vitamin D deficiency, unspecified: Secondary | ICD-10-CM

## 2014-01-20 DIAGNOSIS — M546 Pain in thoracic spine: Secondary | ICD-10-CM

## 2014-01-20 DIAGNOSIS — Z79899 Other long term (current) drug therapy: Secondary | ICD-10-CM

## 2014-01-20 LAB — CBC WITH DIFFERENTIAL/PLATELET
BASOS ABS: 0 10*3/uL (ref 0.0–0.1)
BASOS PCT: 0 % (ref 0–1)
Eosinophils Absolute: 0.1 10*3/uL (ref 0.0–0.7)
Eosinophils Relative: 2 % (ref 0–5)
HEMATOCRIT: 44 % (ref 39.0–52.0)
Hemoglobin: 15.1 g/dL (ref 13.0–17.0)
LYMPHS PCT: 14 % (ref 12–46)
Lymphs Abs: 0.8 10*3/uL (ref 0.7–4.0)
MCH: 31.7 pg (ref 26.0–34.0)
MCHC: 34.3 g/dL (ref 30.0–36.0)
MCV: 92.4 fL (ref 78.0–100.0)
Monocytes Absolute: 0.9 10*3/uL (ref 0.1–1.0)
Monocytes Relative: 16 % — ABNORMAL HIGH (ref 3–12)
NEUTROS ABS: 3.9 10*3/uL (ref 1.7–7.7)
NEUTROS PCT: 68 % (ref 43–77)
PLATELETS: 189 10*3/uL (ref 150–400)
RBC: 4.76 MIL/uL (ref 4.22–5.81)
RDW: 13.6 % (ref 11.5–15.5)
WBC: 5.7 10*3/uL (ref 4.0–10.5)

## 2014-01-20 LAB — BASIC METABOLIC PANEL WITH GFR
BUN: 17 mg/dL (ref 6–23)
CHLORIDE: 105 meq/L (ref 96–112)
CO2: 25 mEq/L (ref 19–32)
Calcium: 9.3 mg/dL (ref 8.4–10.5)
Creat: 1.24 mg/dL (ref 0.50–1.35)
GFR, EST AFRICAN AMERICAN: 74 mL/min
GFR, EST NON AFRICAN AMERICAN: 64 mL/min
Glucose, Bld: 98 mg/dL (ref 70–99)
POTASSIUM: 4.6 meq/L (ref 3.5–5.3)
SODIUM: 141 meq/L (ref 135–145)

## 2014-01-20 LAB — LIPID PANEL
CHOL/HDL RATIO: 4.8 ratio
Cholesterol: 163 mg/dL (ref 0–200)
HDL: 34 mg/dL — AB (ref >39)
LDL CALC: 93 mg/dL (ref 0–99)
TRIGLYCERIDES: 181 mg/dL — AB (ref <150)
VLDL: 36 mg/dL (ref 0–40)

## 2014-01-20 LAB — HEMOGLOBIN A1C
Hgb A1c MFr Bld: 5.7 % — ABNORMAL HIGH (ref <5.7)
Mean Plasma Glucose: 117 mg/dL — ABNORMAL HIGH (ref <117)

## 2014-01-20 LAB — IRON AND TIBC
%SAT: 28 % (ref 20–55)
IRON: 88 ug/dL (ref 42–165)
TIBC: 311 ug/dL (ref 215–435)
UIBC: 223 ug/dL (ref 125–400)

## 2014-01-20 LAB — MAGNESIUM: MAGNESIUM: 1.9 mg/dL (ref 1.5–2.5)

## 2014-01-20 LAB — FERRITIN: Ferritin: 325 ng/mL — ABNORMAL HIGH (ref 22–322)

## 2014-01-20 LAB — HEPATIC FUNCTION PANEL
ALK PHOS: 62 U/L (ref 39–117)
ALT: 17 U/L (ref 0–53)
AST: 17 U/L (ref 0–37)
Albumin: 4.4 g/dL (ref 3.5–5.2)
BILIRUBIN INDIRECT: 0.4 mg/dL (ref 0.2–1.2)
BILIRUBIN TOTAL: 0.5 mg/dL (ref 0.2–1.2)
Bilirubin, Direct: 0.1 mg/dL (ref 0.0–0.3)
TOTAL PROTEIN: 6.3 g/dL (ref 6.0–8.3)

## 2014-01-20 LAB — TSH: TSH: 2.623 u[IU]/mL (ref 0.350–4.500)

## 2014-01-20 LAB — VITAMIN B12: VITAMIN B 12: 249 pg/mL (ref 211–911)

## 2014-01-20 MED ORDER — PREDNISONE 20 MG PO TABS: ORAL_TABLET | ORAL | Status: DC

## 2014-01-20 NOTE — Patient Instructions (Signed)
   Bad carbs also include fruit juice, alcohol, and sweet tea. These are empty calories that do not signal to your brain that you are full.   Please remember the good carbs are still carbs which convert into sugar. So please measure them out no more than 1/2-1 cup of rice, oatmeal, pasta, and beans.  Veggies are however free foods! Pile them on.   I like lean protein at every meal such as chicken, [place], pork chops, cottage cheese, etc. Just do not fry these meats and please center your meal around vegetable, the meats should be a side dish.   No all fruit is created equal. Please see the list below, the fruit at the bottom is higher in sugars than the fruit at the top   Triglycerides, TG, TRIG This is a test to check your risk of developing heart disease. It is often done as part of a lipid profile during a regular medical exam or if you are being treated for high triglycerides. This test measures the amount of triglycerides in your blood. Triglycerides are the body's storage form for fat. Most triglycerides are found in fat tissue. Some triglycerides circulate in the blood to provide fuel for muscles to work. Extra triglycerides are found in the blood after eating a meal when fat is being sent from the gut to fat tissue for storage. The test for triglycerides should be done when you are fasting and no extra triglycerides from a recent meal are present.  SAMPLE COLLECTION The test for triglycerides uses a blood sample. Most often, the blood sample is collected using a needle to collect blood from a vein. Sometimes triglycerides are measured using a drop of blood collected by puncturing the skin on a finger. Testing should be done when you are fasting. For 12 to 14 hours before the test, only water is permitted. In addition, alcohol should not be consumed for the 24 hours just before the test. If you are diabetic and your blood sugar is out of control, triglycerides will be very high. NORMAL  FINDINGS  Adult/elderly  Male: 40-160 mg/dL or 0.45-1.81 mmol/L (SI units)  Male: 35-135 mg/dL or 0.40-1.52 mmol/L (SI units)  0-59 years  Male: 30-86 mg/dL  Male: 32-99 mg/dL  6-59 years  Male: 31-108 mg/dL  Male: 35-114 mg/dL  12-59 years  Male: 36-138 mg/dL  Male: 41-138 mg/dL  16-59 years  Male: 40-163 mg/dL  Male: 40-128 mg/dL Ranges for normal findings may vary among different laboratories and hospitals. You should always check with your doctor after having lab work or other tests done to discuss the meaning of your test results and whether your values are considered within normal limits. MEANING OF TEST  Your caregiver will go over the test results with you and discuss the importance and meaning of your results, as well as treatment options and the need for additional tests if necessary. OBTAINING THE TEST RESULTS It is your responsibility to obtain your test results. Ask the lab or department performing the test when and how you will get your results. Document Released: 01/07/2005 Document Revised: 02/27/2012 Document Reviewed: 11/16/2008 [facility] Patient Information 2014 [place], [place].

## 2014-01-20 NOTE — Progress Notes (Signed)
Complete Physical HPI Patient presents for complete physical.   Patient's blood pressure has been controlled at home. Patient denies chest pain, shortness of breath, dizziness. BP: 110/68 mmHg  Patient's cholesterol is diet controlled. They are on fenofibrate and denies myalgias. The patient's cholesterol last visit was  Lab Results  Component Value Date   CHOL 194 11/11/2013   HDL 31* 11/11/2013   TRIG 542* 11/11/2013   CHOLHDL 6.3 11/11/2013   The patient has been working on diet and exercise for prediabetes, denies changes in vision, polys, and paresthesias. Last A1C in office was  Hemoglobin A1C  Date Value Range Status  11/11/2013 5.7* <5.7 % Final   Patient has had rib/anterior chest pain for 3-4 years that is getting progressively worse. He has seen a pulmonologist, gastroenterologist and cardiologist that have not found a cause for the pain. 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    Current Medications:  Current Outpatient Prescriptions on File Prior to Visit  Medication Sig Dispense Refill  . aspirin 81 MG tablet Take 81 mg by mouth daily.       . Cholecalciferol (VITAMIN D PO) Take 4 tablets by mouth daily.      . fenofibrate micronized (LOFIBRA) 134 MG capsule Take 134 mg by mouth daily before breakfast.      . Flaxseed, Linseed, (FLAXSEED OIL) 1200 MG CAPS Take 1 capsule by mouth daily.      . Omega-3 Fatty Acids (FISH OIL PO) Take 1 tablet by mouth daily.      . traZODone (DESYREL) 150 MG tablet Take by mouth at bedtime. Takes 1/2       No current facility-administered medications on file prior to visit.   Health Maintenance:  Tetanus: 2006 Pneumovax: 2014 Flu vaccine: 10/2013 Zostavax: N/A DEXA: N/A Colonoscopy: 2007 --> 12/2013 Dr. Earlean Shawl EGD: 2015 Allergies: No  Known Allergies Medical History:  Past Medical History  Diagnosis Date  . Diverticulitis   . Skin cancer   . Hypercholesteremia   . COPD (chronic obstructive pulmonary disease)   . ED (erectile dysfunction)   . DJD (degenerative joint disease)   . 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 ? Bicuspid valve with dilated aortic root 4.0 cm.  . Elevated blood pressure reading without diagnosis of hypertension    Surgical History:  Past Surgical History  Procedure Laterality Date  . Colon surgery  Jan 2012  . Tonsillectomy    . Hemorroidectomy    . Shoulder surgery     Family History:  Family History  Problem Relation Age of Onset  . Heart disease Father   . Benign prostatic hyperplasia Father    Social History:  History   Social History  . Marital Status: Married    Spouse Name: N/A    Number of Children: 3  . Years of Education: N/A   Occupational History  . DRIVER [employer]   Social History Main Topics  . Smoking status: Former Smoker -- 0.50 packs/day    Types: Cigarettes    Quit date: 07/19/2013  . Smokeless tobacco: Former Systems developer    Types: [place] date: 12/19/2006  Comment: Started smoking at age 13  . Alcohol Use: Yes     Comment: 1-2 drinks per day  . Drug Use: No  . Sexual Activity: Not on file   Other Topics Concern  . Not on file   Social History Narrative  . No narrative on file   ROS Constitutional: Denies weight loss/gain, headaches, insomnia, fatigue, night sweats, and change in appetite. Eyes: Denies redness, blurred vision, diplopia, discharge, itchy, watery eyes.  ENT: Denies discharge, congestion, post nasal drip, sore throat, earache, hearing loss, dental pain, Tinnitus, Vertigo, Sinus pain, snoring.  Cardio: Johnsie Cancel)  Stress test 2013, echo 2014 Denies chest pain, palpitations, irregular heartbeat, dyspnea, diaphoresis, orthopnea, PND, claudication,  edema Respiratory: Joya Gaskins) CT AB/Chest diverticulosis, PFTs 2014 COPD  denies cough, dyspnea, pleurisy, hoarseness, wheezing.  Gastrointestinal: (Medoff)  + GERD better with med, AB cramps and bloating Denies dysphagia, pain, nausea, vomiting,  diarrhea, constipation, hematemesis, melena, hematochezia, hemorrhoids Genitourinary: + ED Denies dysuria, frequency, urgency, nocturia, hesitancy, discharge, hematuria, flank pain Musculoskeletal: + back pain occ , chest wall/ rib pain Denies arthralgia, myalgia, stiffness, Jt. Swelling, pain, Skin: Denies pruritis, rash, hives,  acne, eczema, changing in skin lesion Neuro: Denies Weakness, tremor, incoordination, spasms, paresthesia, pain Psychiatric: Denies confusion, memory loss, sensory loss Endocrine: Denies change in weight, skin, hair change, nocturia, and paresthesia, Diabetic Denies Polys, visual blurring, hyper /hypo glycemic episodes.  Heme/Lymph: Denies Excessive bleeding, bruising, enlarged lymph nodes  Physical Exam: Estimated body mass index is 30.09 kg/(m^2) as calculated from the following:   Height as of this encounter: 6\' 1"  (1.854 m).   Weight as of this encounter: 228 lb (103.42 kg). Filed Vitals:   01/20/14 0857  BP: 110/68  Pulse: 80  Temp: 98.6 F (37 C)  Resp: 16   General Appearance: Well nourished, in no apparent distress. Eyes: PERRLA, EOMs, conjunctiva no swelling or erythema, normal fundi and vessels. Sinuses: No Frontal/maxillary tenderness ENT/Mouth: Ext aud canals clear, normal light reflex with TMs without erythema, bulging. Good dentition. No erythema, swelling, or exudate on post pharynx. Tonsils not swollen or erythematous. Hearing normal.  Neck: Supple, thyroid normal. No bruits Respiratory: Respiratory effort normal, BS equal bilaterally without rales, rhonchi, wheezing or stridor. Cardio: RRR without murmurs, rubs or gallops. Brisk peripheral pulses without edema.  Chest: symmetric, with normal excursions  and percussion. States when elevating rib cage pain is better.  Abdomen: Soft, +BS. diffuse tenderness, no guarding, rebound, hernias, masses, or organomegaly. .  Lymphatics: Non tender without lymphadenopathy.  Genitourinary: defer Musculoskeletal: Full ROM all peripheral extremities,5/5 strength, and normal gait. Skin: Warm, dry without rashes, lesions, ecchymosis.  Neuro: Cranial nerves intact, reflexes equal bilaterally. Normal muscle tone, no cerebellar symptoms. Sensation intact.  Psych: Awake and oriented X 3, normal affect, Insight and Judgment appropriate.   EKG: WNL no changes.  Assessment and Plan: Diverticulitis s/p colon resection- recent colonoscopy with Dr. 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   COPD (chronic obstructive pulmonary disease)- questionable if accurate- PFTs normal  ED (erectile dysfunction)- viagra PRN  DJD (degenerative joint disease)- controlled  Bicuspid aortic valve- cont follow up with Dr. Johnsie Cancel  Elevated blood pressure reading without diagnosis of hypertension- monitor  Rib cage/thoracic back pain- several things have been ruled out, Dr. Earlean Shawl has suggested possible adhesions and doing explor. Lap however the nature of the pain seems very mechanical and I think he would benefit from being treated by PT and possible ortho evaluation ? Subluxation of ribs-  he would prefer just Pt for now and has felt better with Prednisone in the past- will give that as well.   Health Maintenance Discussed med's effects and SE's. Screening labs and tests as requested with regular follow-up as recommended.  [name] 9:07 AM

## 2014-01-21 LAB — URINE CULTURE
Colony Count: NO GROWTH
ORGANISM ID, BACTERIA: NO GROWTH

## 2014-01-21 LAB — URINALYSIS, ROUTINE W REFLEX MICROSCOPIC
BILIRUBIN URINE: NEGATIVE
Glucose, UA: NEGATIVE mg/dL
Hgb urine dipstick: NEGATIVE
KETONES UR: NEGATIVE mg/dL
Leukocytes, UA: NEGATIVE
NITRITE: NEGATIVE
PH: 5 (ref 5.0–8.0)
Protein, ur: NEGATIVE mg/dL
SPECIFIC GRAVITY, URINE: 1.014 (ref 1.005–1.030)
UROBILINOGEN UA: 0.2 mg/dL (ref 0.0–1.0)

## 2014-01-21 LAB — MICROALBUMIN / CREATININE URINE RATIO
Creatinine, Urine: 63.1 mg/dL
Microalb Creat Ratio: 7.9 mg/g (ref 0.0–30.0)
Microalb, Ur: 0.5 mg/dL (ref 0.00–1.89)

## 2014-01-21 LAB — INSULIN, FASTING: INSULIN FASTING, SERUM: 14 u[IU]/mL (ref 3–28)

## 2014-01-21 LAB — VITAMIN D 25 HYDROXY (VIT D DEFICIENCY, FRACTURES): VIT D 25 HYDROXY: 76 ng/mL (ref 30–89)

## 2014-01-21 LAB — FOLATE RBC: RBC Folate: 453 ng/mL (ref >280)

## 2014-01-21 LAB — PSA: PSA: 0.57 ng/mL (ref <=4.00)

## 2014-01-27 ENCOUNTER — Other Ambulatory Visit: Payer: Self-pay | Admitting: *Deleted

## 2014-01-27 MED ORDER — FENOFIBRATE MICRONIZED 134 MG PO CAPS: 134.0000 mg | ORAL_CAPSULE | Freq: Every day | ORAL | Status: DC

## 2014-04-29 ENCOUNTER — Emergency Department (HOSPITAL_COMMUNITY): Payer: BC Managed Care – PPO

## 2014-04-29 ENCOUNTER — Encounter (HOSPITAL_COMMUNITY): Payer: Self-pay | Admitting: Emergency Medicine

## 2014-04-29 ENCOUNTER — Emergency Department (HOSPITAL_COMMUNITY)
Admission: EM | Admit: 2014-04-29 | Discharge: 2014-04-29 | Disposition: A | Discharge status: Home / Self Care | Payer: BC Managed Care – PPO | Attending: Emergency Medicine | Admitting: Emergency Medicine

## 2014-04-29 DIAGNOSIS — R011 Cardiac murmur, unspecified: Secondary | ICD-10-CM | POA: Insufficient documentation

## 2014-04-29 DIAGNOSIS — K5900 Constipation, unspecified: Secondary | ICD-10-CM | POA: Insufficient documentation

## 2014-04-29 DIAGNOSIS — Z85828 Personal history of other malignant neoplasm of skin: Secondary | ICD-10-CM | POA: Insufficient documentation

## 2014-04-29 DIAGNOSIS — E7800 Pure hypercholesterolemia, unspecified: Secondary | ICD-10-CM | POA: Insufficient documentation

## 2014-04-29 DIAGNOSIS — Z87891 Personal history of nicotine dependence: Secondary | ICD-10-CM | POA: Insufficient documentation

## 2014-04-29 DIAGNOSIS — M545 Low back pain, unspecified: Secondary | ICD-10-CM | POA: Insufficient documentation

## 2014-04-29 DIAGNOSIS — R0602 Shortness of breath: Secondary | ICD-10-CM | POA: Insufficient documentation

## 2014-04-29 DIAGNOSIS — Z8774 Personal history of (corrected) congenital malformations of heart and circulatory system: Secondary | ICD-10-CM | POA: Insufficient documentation

## 2014-04-29 DIAGNOSIS — R109 Unspecified abdominal pain: Secondary | ICD-10-CM

## 2014-04-29 DIAGNOSIS — IMO0002 Reserved for concepts with insufficient information to code with codable children: Secondary | ICD-10-CM | POA: Insufficient documentation

## 2014-04-29 DIAGNOSIS — Z7982 Long term (current) use of aspirin: Secondary | ICD-10-CM | POA: Insufficient documentation

## 2014-04-29 DIAGNOSIS — Z79899 Other long term (current) drug therapy: Secondary | ICD-10-CM | POA: Insufficient documentation

## 2014-04-29 DIAGNOSIS — M1990 Unspecified osteoarthritis, unspecified site: Secondary | ICD-10-CM | POA: Insufficient documentation

## 2014-04-29 DIAGNOSIS — Z87448 Personal history of other diseases of urinary system: Secondary | ICD-10-CM | POA: Insufficient documentation

## 2014-04-29 DIAGNOSIS — Z9889 Other specified postprocedural states: Secondary | ICD-10-CM | POA: Insufficient documentation

## 2014-04-29 LAB — URINALYSIS, ROUTINE W REFLEX MICROSCOPIC
BILIRUBIN URINE: NEGATIVE
Glucose, UA: NEGATIVE mg/dL
HGB URINE DIPSTICK: NEGATIVE
Ketones, ur: NEGATIVE mg/dL
Leukocytes, UA: NEGATIVE
Nitrite: NEGATIVE
PROTEIN: NEGATIVE mg/dL
Specific Gravity, Urine: 1.009 (ref 1.005–1.030)
Urobilinogen, UA: 0.2 mg/dL (ref 0.0–1.0)
pH: 6 (ref 5.0–8.0)

## 2014-04-29 LAB — CBC WITH DIFFERENTIAL/PLATELET
Basophils Absolute: 0 10*3/uL (ref 0.0–0.1)
Basophils Relative: 0 % (ref 0–1)
EOS ABS: 0.1 10*3/uL (ref 0.0–0.7)
Eosinophils Relative: 2 % (ref 0–5)
HCT: 42.6 % (ref 39.0–52.0)
Hemoglobin: 14.8 g/dL (ref 13.0–17.0)
LYMPHS PCT: 29 % (ref 12–46)
Lymphs Abs: 1.7 10*3/uL (ref 0.7–4.0)
MCH: 31.7 pg (ref 26.0–34.0)
MCHC: 34.7 g/dL (ref 30.0–36.0)
MCV: 91.2 fL (ref 78.0–100.0)
MONOS PCT: 11 % (ref 3–12)
Monocytes Absolute: 0.7 10*3/uL (ref 0.1–1.0)
NEUTROS ABS: 3.4 10*3/uL (ref 1.7–7.7)
Neutrophils Relative %: 57 % (ref 43–77)
PLATELETS: 165 10*3/uL (ref 150–400)
RBC: 4.67 MIL/uL (ref 4.22–5.81)
RDW: 13.2 % (ref 11.5–15.5)
WBC: 5.9 10*3/uL (ref 4.0–10.5)

## 2014-04-29 LAB — COMPREHENSIVE METABOLIC PANEL
ALK PHOS: 56 U/L (ref 39–117)
ALT: 17 U/L (ref 0–53)
AST: 20 U/L (ref 0–37)
Albumin: 3.9 g/dL (ref 3.5–5.2)
BILIRUBIN TOTAL: 0.4 mg/dL (ref 0.3–1.2)
BUN: 14 mg/dL (ref 6–23)
CHLORIDE: 103 meq/L (ref 96–112)
CO2: 24 meq/L (ref 19–32)
Calcium: 9.6 mg/dL (ref 8.4–10.5)
Creatinine, Ser: 1.34 mg/dL (ref 0.50–1.35)
GFR calc non Af Amer: 56 mL/min — ABNORMAL LOW (ref >90)
GFR, EST AFRICAN AMERICAN: 65 mL/min — AB (ref >90)
GLUCOSE: 101 mg/dL — AB (ref 70–99)
Potassium: 4.2 mEq/L (ref 3.7–5.3)
Sodium: 139 mEq/L (ref 137–147)
TOTAL PROTEIN: 6.2 g/dL (ref 6.0–8.3)

## 2014-04-29 LAB — LIPASE, BLOOD: LIPASE: 16 U/L (ref 11–59)

## 2014-04-29 LAB — PRO B NATRIURETIC PEPTIDE: Pro B Natriuretic peptide (BNP): 52.6 pg/mL (ref 0–125)

## 2014-04-29 MED ORDER — PEG 3350-KCL-NABCB-NACL-NASULF 236 G PO SOLR: 4000.0000 mL | Freq: Once | ORAL | Status: DC

## 2014-04-29 NOTE — ED Notes (Signed)
Pt reports constant aching pain to bilateral ribcage with breathing. Pt lung sounds clear. Pt denies chest pain but reports tenderness all over abdomen.

## 2014-04-29 NOTE — ED Notes (Signed)
Pt c/o SOB x a few months. 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 Denies chest pain states it feels like his lungs.

## 2014-04-29 NOTE — ED Notes (Signed)
Pt encouraged to void when able. 

## 2014-04-29 NOTE — Discharge Instructions (Signed)
Try miralax first 3scoops in 8oz of water daily for 2-3 days and do not have the desired effect use the golytely Bloating Bloating is the feeling of fullness in your belly. You may feel as though your pants are too tight. Often the cause of bloating is overeating, retaining fluids, or having gas in your bowel. It is also caused by swallowing air and eating foods that cause gas. Irritable bowel syndrome is one of the most common causes of bloating. Constipation is also a common cause. Sometimes more serious problems can cause bloating. SYMPTOMS  Usually there is a feeling of fullness, as though your abdomen is bulged out. There may be mild discomfort.  DIAGNOSIS  Usually no particular testing is necessary for most bloating. If the condition persists and seems to become worse, your caregiver may do additional testing.  TREATMENT   There is no direct treatment for bloating.  Do not put gas into the bowel. Avoid chewing gum and sucking on candy. These tend to make you swallow air. Swallowing air can also be a nervous habit. Try to avoid this.  Avoiding high residue diets will help. Eat foods with soluble fibers (examples include root vegetables, apples, or barley) and substitute dairy products with soy and rice products. This helps irritable bowel syndrome.  If constipation is the cause, then a high residue diet with more fiber will help.  Avoid carbonated beverages.  Over-the-counter preparations are available that help reduce gas. Your pharmacist can help you with this. SEEK MEDICAL CARE IF:   Bloating continues and seems to be getting worse.  You notice a weight gain.  You have a weight loss but the bloating is getting worse.  You have changes in your bowel habits or develop nausea or vomiting. SEEK IMMEDIATE MEDICAL CARE IF:   You develop shortness of breath or swelling in your legs.  You have an increase in abdominal pain or develop chest pain. Document Released: 10/05/2006  Document Revised: 02/27/2012 Document Reviewed: 11/23/2007 [facility] Patient Information 2014 [place].

## 2014-04-29 NOTE — ED Provider Notes (Signed)
CSN: [id number]     Arrival date & time 04/29/14  9924 History   First MD Initiated Contact with Patient 04/29/14 0734     Chief Complaint  Patient presents with  . Shortness of Breath     (Consider location/radiation/quality/duration/timing/severity/associated sxs/prior Treatment) HPI Comments: Pt states for the last 3-4 months he has had gradually worsening episodes of SOB and discomfort under bilateral ribs.  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  States also sometimes eating will make sx worse and cause him to feel bloated.  Lying down flat makes sx better.  Occasionally will have nausea with it but denies vomiting.  BM's have not changes but are anywhere from liquid to formed.  He denies worsening pain with deep breathing and states if he pushes up underneath bilateral ribs it gives him some relief.  He denies any swelling in the abd or weight gain.  His clothes fit the same.  He denies coughing, wheezing, swelling or fever but does have hx of bicuspid valve with echo in 2014 showing mod AR from AS. He has seen his PCP about this issue and had blood work done but states he was referred for massage therapy.  However sx are getting worse and have been really bad in the last few days.  Hx of 1-2 alcoholic beverages daily and quit smoking about 80months ago.  Hx of abnormal chest CT showing some blebs but normal PFT's.  Also has hx of colon resection 3 years ago for diverticulitis.  The history is provided by the patient.    Past Medical History  Diagnosis Date  . Diverticulitis   . Skin cancer   . Hypercholesteremia   . Abnormal CT scan, chest     Blebs on CT chest but NORMAL PFTs- no COPD  . ED (erectile dysfunction)   . DJD (degenerative joint disease)   . 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 ? Bicuspid valve with dilated aortic  root 4.0 cm.  . Elevated blood pressure reading without diagnosis of hypertension    Past Surgical History  Procedure Laterality Date  . Colon surgery  Jan 2012  . Tonsillectomy    . Hemorroidectomy    . Shoulder surgery    . Colonoscopy  12/2013    Dr. Earlean Shawl, due every 5-7 years  . Upper gi endoscopy  2015    Dr. Earlean Shawl   Family History  Problem Relation Age of Onset  . Heart disease Father   . Benign prostatic hyperplasia Father    History  Substance Use Topics  . Smoking status: Former Smoker -- 0.50 packs/day    Types: Cigarettes    Quit date: 07/19/2013  . Smokeless tobacco: Former Systems developer    Types: Chew    Quit date: 12/19/2006     Comment: Started smoking at age 55  . Alcohol Use: Yes     Comment: 1-2 drinks per day    Review of Systems    Allergies  Review of patient's allergies indicates no known allergies.  Home Medications   Prior to Admission medications   Medication Sig Start Date End Date Taking? Authorizing Provider  aspirin 81 MG tablet Take 81 mg by mouth daily.     Historical Provider, MD  Cholecalciferol (VITAMIN D PO) Take 4 tablets by  mouth daily.    Historical Provider, MD  fenofibrate micronized (LOFIBRA) 134 MG capsule Take 1 capsule (134 mg total) by mouth daily before breakfast. 01/27/14   [name], PA-C  Flaxseed, Linseed, (FLAXSEED OIL) 1200 MG CAPS Take 1 capsule by mouth daily.    Historical Provider, MD  Omega-3 Fatty Acids (FISH OIL PO) Take 1 tablet by mouth daily.    Historical Provider, MD  predniSONE (DELTASONE) 20 MG tablet Take one tablet three times daily with food for 3 days, take one tablet two times daily with food for 3 days, take one tablet daily for 5 days. 01/20/14   [name], PA-C  traZODone (DESYREL) 150 MG tablet Take by mouth at bedtime. Takes 1/2    Historical Provider, MD   BP 153/88  Pulse 79  Temp(Src) 97.8 F (36.6 C) (Oral)  Resp 20  SpO2 99% Physical Exam  Nursing note and vitals  reviewed. Constitutional: He is oriented to person, place, and time. He appears well-developed and well-nourished. No distress.  HENT:  Head: Normocephalic and atraumatic.  Mouth/Throat: Oropharynx is clear and moist.  Eyes: Conjunctivae and EOM are normal. Pupils are equal, round, and reactive to light.  Neck: Normal range of motion. Neck supple. No spinous process tenderness and no muscular tenderness present. No rigidity. Normal range of motion present.  Cardiovascular: Normal rate, regular rhythm and intact distal pulses.   Murmur heard.  Systolic murmur is present with a grade of 2/6  Pulmonary/Chest: Effort normal and breath sounds normal. No respiratory distress. He has no wheezes. He has no rales.  Abdominal: Soft. Bowel sounds are normal. He exhibits no distension and no ascites. There is tenderness in the right lower quadrant, epigastric area and left lower quadrant. There is no rebound, no guarding and no CVA tenderness.  When palpating under bilateral ribs pt states the pain is relieved.  Well healed lower midline surgical scar  Musculoskeletal: He exhibits no edema and no tenderness.       Lumbar back: He exhibits decreased range of motion and pain. He exhibits no swelling, no deformity and normal pulse.  Neurological: He is alert and oriented to person, place, and time. Coordination normal.  Reflex Scores:      Patellar reflexes are 1+ on the right side and 1+ on the left side. Skin: Skin is warm and dry. No rash noted. No erythema.  Psychiatric: He has a normal mood and affect. His behavior is normal.    ED Course  Procedures (including critical care time) Labs Review Labs Reviewed  COMPREHENSIVE METABOLIC PANEL - Abnormal; Notable for the following:    Glucose, Bld 101 (*)    GFR calc non Af Amer 56 (*)    GFR calc Af Amer 65 (*)    All other components within normal limits  CBC WITH DIFFERENTIAL  LIPASE, BLOOD  PRO B NATRIURETIC PEPTIDE  URINALYSIS, ROUTINE W REFLEX  MICROSCOPIC    Imaging Review Dg Abd Acute W/chest  04/29/2014   CLINICAL DATA:  Difficulty breathing; abdominal pain  EXAM: ACUTE ABDOMEN SERIES (ABDOMEN 2 VIEW & CHEST 1 VIEW)  COMPARISON:  Chest radiograph May 07, 2012; CT abdomen and pelvis March 11, 2010  FINDINGS: PA chest: There is no edema or consolidation. The heart size is normal. The pulmonary vascularity is stable and reflects a degree of underlying emphysematous change. No adenopathy.  Supine and upright abdomen: There is moderate stool throughout the colon. The bowel gas pattern is unremarkable. No obstruction or  free air is apparent. There are phleboliths in the pelvis.  IMPRESSION: The bowel gas pattern overall is unremarkable. There is moderate stool throughout the colon. There is a degree of underlying emphysematous change. No edema or consolidation.   Electronically Signed   By: Lowella Grip M.D.   On: 04/29/2014 08:28     EKG Interpretation   Date/Time:  Tuesday Apr 29 2014 07:20:18 EDT Ventricular Rate:  78 PR Interval:  132 QRS Duration: 97 QT Interval:  369 QTC Calculation: 420 R Axis:   72 Text Interpretation:  Sinus rhythm Baseline wander in lead(s) V4 V6 Normal  ECG No previous tracing Confirmed by Maryan Rued  MD, Loree Fee ([id number]) on  04/29/2014 7:42:10 AM      MDM   Final diagnoses:  Abdominal pain  SOB (shortness of breath)  Constipation    Patient with 3-4 months now of pain underneath bilateral ribs which causes him to feel short of breath. It is worse with sitting upright and balancing around in his truck when he works at night. Sometimes irritated by food. He feels better with lying flat. He denies any weight gain, swelling, chest pain. He denies cough, wheezing. He has a significant history of moderate aortic regurgitation from mild aortic stenosis from a bicuspid valve but no other cardiac history. He's been tobacco free for approximately one year but does drink one to 2 alcoholic beverages daily. He  also has a significant history of bowel resection from diverticulitis.  He states in the last 2-3 days symptoms have worsened. It is not more painful with taking a deep breath. On exam his lungs are clear vital signs are normal and EKG is within normal limits. He does have tenderness to palpation over his abdomen. Based on patient's history and exam feel most likely symptoms are related to abdominal process versus lung process. Low suspicion for cardiac etiology at this time.  CBC, CMP, BNP, UA, acute abdominal series pending.  9:44 AM Labs are all wnl.  AAS with moderate stool burden but o/w wnl.  Speaking with the pt he states he had abd CT about 6 months ago which was normal.  Feel that all emergent conditions have been ruled out.  Pt given miralax and golytely for stool burden and discussed in length outpt f/u.  Blanchie Dessert, MD 04/29/14 [phone]

## 2014-04-29 NOTE — ED Notes (Signed)
Pt transported to xray 

## 2014-05-05 ENCOUNTER — Ambulatory Visit: Payer: Self-pay | Admitting: Physician Assistant

## 2014-05-13 ENCOUNTER — Ambulatory Visit (INDEPENDENT_AMBULATORY_CARE_PROVIDER_SITE_OTHER): Payer: BC Managed Care – PPO | Admitting: Physician Assistant

## 2014-05-13 ENCOUNTER — Encounter: Payer: Self-pay | Admitting: Physician Assistant

## 2014-05-13 VITALS — BP 110/68 | HR 76 | Temp 98.4°F | Resp 16 | Ht 73.0 in | Wt 224.0 lb

## 2014-05-13 DIAGNOSIS — M1990 Unspecified osteoarthritis, unspecified site: Secondary | ICD-10-CM

## 2014-05-13 DIAGNOSIS — R7303 Prediabetes: Secondary | ICD-10-CM

## 2014-05-13 DIAGNOSIS — Q231 Congenital insufficiency of aortic valve: Secondary | ICD-10-CM

## 2014-05-13 DIAGNOSIS — E782 Mixed hyperlipidemia: Secondary | ICD-10-CM

## 2014-05-13 DIAGNOSIS — E7800 Pure hypercholesterolemia, unspecified: Secondary | ICD-10-CM

## 2014-05-13 DIAGNOSIS — R0781 Pleurodynia: Secondary | ICD-10-CM

## 2014-05-13 DIAGNOSIS — R7309 Other abnormal glucose: Secondary | ICD-10-CM

## 2014-05-13 DIAGNOSIS — E559 Vitamin D deficiency, unspecified: Secondary | ICD-10-CM

## 2014-05-13 DIAGNOSIS — Z79899 Other long term (current) drug therapy: Secondary | ICD-10-CM

## 2014-05-13 DIAGNOSIS — R079 Chest pain, unspecified: Secondary | ICD-10-CM

## 2014-05-13 LAB — CBC WITH DIFFERENTIAL/PLATELET
BASOS ABS: 0 10*3/uL (ref 0.0–0.1)
Basophils Relative: 0 % (ref 0–1)
Eosinophils Absolute: 0.1 10*3/uL (ref 0.0–0.7)
Eosinophils Relative: 2 % (ref 0–5)
HCT: 44.4 % (ref 39.0–52.0)
Hemoglobin: 15.2 g/dL (ref 13.0–17.0)
LYMPHS ABS: 1.1 10*3/uL (ref 0.7–4.0)
LYMPHS PCT: 17 % (ref 12–46)
MCH: 30.6 pg (ref 26.0–34.0)
MCHC: 34.2 g/dL (ref 30.0–36.0)
MCV: 89.5 fL (ref 78.0–100.0)
Monocytes Absolute: 0.9 10*3/uL (ref 0.1–1.0)
Monocytes Relative: 13 % — ABNORMAL HIGH (ref 3–12)
Neutro Abs: 4.5 10*3/uL (ref 1.7–7.7)
Neutrophils Relative %: 68 % (ref 43–77)
Platelets: 183 10*3/uL (ref 150–400)
RBC: 4.96 MIL/uL (ref 4.22–5.81)
RDW: 13.9 % (ref 11.5–15.5)
WBC: 6.6 10*3/uL (ref 4.0–10.5)

## 2014-05-13 LAB — HEMOGLOBIN A1C
Hgb A1c MFr Bld: 5.8 % — ABNORMAL HIGH (ref <5.7)
Mean Plasma Glucose: 120 mg/dL — ABNORMAL HIGH (ref <117)

## 2014-05-13 MED ORDER — CELECOXIB 200 MG PO CAPS: 200.0000 mg | ORAL_CAPSULE | Freq: Two times a day (BID) | ORAL | Status: DC

## 2014-05-13 NOTE — Progress Notes (Signed)
HPI 58 y.o. male  presents for 3 month follow up with hypertension, hyperlipidemia, prediabetes and vitamin D. His blood pressure has been controlled at home, today their BP is BP: 110/68 mmHg He does not workout. He denies chest pain, shortness of breath, dizziness.  He is on cholesterol medication and denies myalgias. His cholesterol is at goal. The cholesterol last visit was:   Lab Results  Component Value Date   CHOL 163 01/20/2014   HDL 34* 01/20/2014   LDLCALC 93 01/20/2014   TRIG 181* 01/20/2014   CHOLHDL 4.8 01/20/2014   He has been working on diet and exercise for prediabetes, and denies polydipsia, polyuria, visual disturbances and vomiting. Last A1C in the office was:  Lab Results  Component Value Date   HGBA1C 5.7* 01/20/2014   Patient is on Vitamin D supplement.   Patient has had rib/anterior chest pain for 3-4 years that is getting progressively worse. He has seen a pulmonologist, gastroenterologist and cardiologist and most recently was treated with PT. Nothing has helped and they have not found a cause for the pain. 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    Current Medications:  Current Outpatient Prescriptions on File Prior to Visit  Medication Sig Dispense Refill  . acetaminophen (TYLENOL) 500 MG tablet Take 1,500 mg by mouth every 6 (six) hours as needed for mild pain.      Marland Kitchen aspirin 81 MG tablet Take 81 mg by mouth daily.       . Cholecalciferol (VITAMIN D PO) Take 2 tablets by mouth daily.       . fenofibrate micronized (LOFIBRA) 134 MG capsule Take 1 capsule (134 mg total) by mouth daily before breakfast.  90 capsule  1  . Flaxseed, Linseed, (FLAXSEED OIL) 1200 MG CAPS Take 1 capsule by mouth daily.      . Omega-3 Fatty Acids (FISH OIL PO) Take 1 tablet by mouth daily.      .  traZODone (DESYREL) 150 MG tablet Take 150 mg by mouth at bedtime.        No current facility-administered medications on file prior to visit.   Medical History:  Past Medical History  Diagnosis Date  . Diverticulitis   . Skin cancer   . Hypercholesteremia   . Abnormal CT scan, chest     Blebs on CT chest but NORMAL PFTs- no COPD  . ED (erectile dysfunction)   . DJD (degenerative joint disease)   . 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 ? Bicuspid valve with dilated aortic root 4.0 cm.  . Elevated blood pressure reading without diagnosis of hypertension    Allergies: No Known Allergies   Review of Systems: [X]  = complains of  [ ]  = denies  General: Fatigue [ ]  Fever [ ]  Chills [ ]  Weakness [ ]   Insomnia [ ]  Eyes: Redness [ ]  Blurred vision [ ]  Diplopia [ ]   ENT: Congestion [ ]  Sinus Pain [ ]  Post Nasal Drip [ ]  Sore Throat [ ]  Earache [ ]   Cardiac: Chest pain/pressure Valu.Nieves ] SOB [ ]  Orthopnea [ ]   Palpitations [ ]   Paroxysmal nocturnal dyspnea[ ]  Claudication [ ]   Edema [ ]   Pulmonary: Cough [ ]  Wheezing[ ]   SOB [ ]   Snoring [ ]   GI: Nausea [ ]  Vomiting[ ]  Dysphagia[ ]  Heartburn[ ]  Abdominal pain [ ]  Constipation [ ] ; Diarrhea [ ] ; BRBPR [ ]  Melena[ ]  GU: Hematuria[ ]  Dysuria [ ]  Nocturia[ ]  Urgency [ ]   Hesitancy [ ]  Discharge [ ]  Neuro: Headaches[ ]  Vertigo[ ]  Paresthesias[ ]  Spasm [ ]  Speech changes [ ]  Incoordination [ ]   Ortho: Arthritis [ ]  Joint pain [ ]  Muscle pain [ ]  Joint swelling [ ]  Back Pain [ ]  Skin:  Rash [ ]   Pruritis [ ]  Change in skin lesion [ ]   Psych: Depression[ ]  Anxiety[ ]  Confusion [ ]  Memory loss [ ]   Heme/Lypmh: Bleeding [ ]  Bruising [ ]  Enlarged lymph nodes [ ]   Endocrine: Visual blurring [ ]  Paresthesia [ ]  Polyuria [ ]  Polydypsea [ ]    Heat/cold intolerance [ ]  Hypoglycemia [ ]   Family history- Review and unchanged Social history- Review and unchanged Physical  Exam: BP 110/68  Pulse 76  Temp(Src) 98.4 F (36.9 C)  Resp 16  Ht 6\' 1"  (1.854 m)  Wt 224 lb (101.606 kg)  BMI 29.56 kg/m2 Wt Readings from Last 3 Encounters:  05/13/14 224 lb (101.606 kg)  01/20/14 228 lb (103.42 kg)  11/15/13 228 lb (103.42 kg)   General Appearance: Well nourished, in no apparent distress. Eyes: PERRLA, EOMs, conjunctiva no swelling or erythema Sinuses: No Frontal/maxillary tenderness ENT/Mouth: Ext aud canals clear, TMs without erythema, bulging. No erythema, swelling, or exudate on post pharynx.  Tonsils not swollen or erythematous. Hearing normal.  Neck: Supple, thyroid normal.  Respiratory: Respiratory effort normal, BS equal bilaterally without rales, rhonchi, wheezing or stridor.  Cardio: RRR with no MRGs. Brisk peripheral pulses without edema.  Abdomen: Soft, + BS.  Non tender, no guarding, rebound, hernias, masses. Lymphatics: Non tender without lymphadenopathy.  Musculoskeletal: Full ROM, 5/5 strength, normal gait.  Skin: Warm, dry without rashes, lesions, ecchymosis.  Neuro: Cranial nerves intact. Normal muscle tone, no cerebellar symptoms. Sensation intact.  Psych: Awake and oriented X 3, normal affect, Insight and Judgment appropriate.   Assessment and Plan:  Hypertension: Continue medication, monitor blood pressure at home. Continue DASH diet. Cholesterol: Continue diet and exercise. Check cholesterol.  Pre-diabetes-Continue diet and exercise. Check A1C Vitamin D Def- check level and continue medications.  Anterior rib pain- several things negative- responds well to prednisone-nature of the pain seems very mechanical ? Subluxation of ribs, did not respond well to PT-    suggest trying Nexium and Celebrex and we can refer to  Continue diet and meds as discussed. Further disposition pending results of labs.  [name] 9:41 AM

## 2014-05-13 NOTE — Patient Instructions (Signed)

## 2014-05-14 LAB — TSH: TSH: 4.023 u[IU]/mL (ref 0.350–4.500)

## 2014-05-14 LAB — HEPATIC FUNCTION PANEL
ALT: 20 U/L (ref 0–53)
AST: 16 U/L (ref 0–37)
Albumin: 4.4 g/dL (ref 3.5–5.2)
Alkaline Phosphatase: 59 U/L (ref 39–117)
BILIRUBIN DIRECT: 0.1 mg/dL (ref 0.0–0.3)
BILIRUBIN INDIRECT: 0.5 mg/dL (ref 0.2–1.2)
BILIRUBIN TOTAL: 0.6 mg/dL (ref 0.2–1.2)
Total Protein: 6.3 g/dL (ref 6.0–8.3)

## 2014-05-14 LAB — BASIC METABOLIC PANEL WITH GFR
BUN: 17 mg/dL (ref 6–23)
CO2: 23 mEq/L (ref 19–32)
Calcium: 9.3 mg/dL (ref 8.4–10.5)
Chloride: 101 mEq/L (ref 96–112)
Creat: 1.2 mg/dL (ref 0.50–1.35)
GFR, EST AFRICAN AMERICAN: 76 mL/min
GFR, EST NON AFRICAN AMERICAN: 66 mL/min
Glucose, Bld: 104 mg/dL — ABNORMAL HIGH (ref 70–99)
POTASSIUM: 4.4 meq/L (ref 3.5–5.3)
Sodium: 134 mEq/L — ABNORMAL LOW (ref 135–145)

## 2014-05-14 LAB — SEDIMENTATION RATE: SED RATE: 1 mm/h (ref 0–16)

## 2014-05-14 LAB — LIPID PANEL
CHOLESTEROL: 183 mg/dL (ref 0–200)
HDL: 37 mg/dL — AB (ref >39)
LDL Cholesterol: 99 mg/dL (ref 0–99)
TRIGLYCERIDES: 237 mg/dL — AB (ref <150)
Total CHOL/HDL Ratio: 4.9 Ratio
VLDL: 47 mg/dL — AB (ref 0–40)

## 2014-05-14 LAB — MAGNESIUM: MAGNESIUM: 1.9 mg/dL (ref 1.5–2.5)

## 2014-05-14 LAB — INSULIN, FASTING: Insulin fasting, serum: 16 u[IU]/mL (ref 3–28)

## 2014-05-14 LAB — VITAMIN D 25 HYDROXY (VIT D DEFICIENCY, FRACTURES): Vit D, 25-Hydroxy: 82 ng/mL (ref 30–89)

## 2014-06-05 ENCOUNTER — Other Ambulatory Visit (HOSPITAL_COMMUNITY): Payer: Self-pay | Admitting: Internal Medicine

## 2014-08-11 ENCOUNTER — Ambulatory Visit (INDEPENDENT_AMBULATORY_CARE_PROVIDER_SITE_OTHER): Payer: BC Managed Care – PPO | Admitting: Cardiovascular Disease

## 2014-08-11 ENCOUNTER — Other Ambulatory Visit: Payer: Self-pay | Admitting: Physician Assistant

## 2014-08-11 ENCOUNTER — Encounter: Payer: Self-pay | Admitting: Cardiovascular Disease

## 2014-08-11 VITALS — BP 118/76 | HR 80 | Resp 12 | Ht 73.0 in | Wt 222.0 lb

## 2014-08-11 DIAGNOSIS — I350 Nonrheumatic aortic (valve) stenosis: Secondary | ICD-10-CM

## 2014-08-11 DIAGNOSIS — I359 Nonrheumatic aortic valve disorder, unspecified: Secondary | ICD-10-CM

## 2014-08-11 NOTE — Assessment & Plan Note (Signed)
No change in murmur f/u echo mild AS/moderate AR

## 2014-08-11 NOTE — Progress Notes (Signed)
Patient ID: Anthony Paul, male   DOB: [birth year], 58 y.o.   MRN: [id number] 58 yo referred by Dr Melford Aase for murmur. 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 He indicates seeing Dr Vidal Schwalbe and having echo but these records not available. Had CT scan for some lower lung and abdominal pain and valve noted to be calcified. 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 ? Bicuspid valve with dilated aortic root 4.0 cm. Patient has mild exertional dyspnea. Some muscular sounding chest pain. No palpitations or syncope. Sees dentist twice/yearr. Previous smoker. CRF elevated BP and cholesterol MRI with no coarctation and ascending aorta 3.9 cm  Doing well still driving for [employer] [place] Was asking about TAVR and I told him it wasn't for bicuspid valves  Echo 9/14 with mild AS and moderate AR gradients 16/27 mmHg    ROS: Denies fever, malais, weight loss, blurry vision, decreased visual acuity, cough, sputum, SOB, hemoptysis, pleuritic pain, palpitaitons, heartburn, abdominal pain, melena, lower extremity edema, claudication, or rash.  All other systems reviewed and negative  General: Affect appropriate Healthy:  appears stated age 16: normal Neck supple with no adenopathy JVP normal no bruits no thyromegaly Lungs clear with no wheezing and good diaphragmatic motion Heart:  S1/S2 AS/AR murmur, no rub, gallop or click PMI normal Abdomen: benighn, BS positve, no tenderness, no AAA no bruit.  No HSM or HJR Distal pulses intact with no bruits No edema Neuro non-focal Skin warm and dry No muscular weakness   Current Outpatient Prescriptions  Medication Sig Dispense Refill  . acetaminophen (TYLENOL) 500 MG tablet Take 1,500 mg by mouth every 6 (six) hours as needed for mild pain.      Marland Kitchen aspirin 81 MG tablet Take 81 mg by mouth daily.       . celecoxib (CELEBREX) 200 MG capsule Take 1 capsule (200 mg total) by  mouth 2 (two) times daily.  60 capsule  2  . Cholecalciferol (VITAMIN D PO) Take 2 tablets by mouth daily.       . fenofibrate micronized (LOFIBRA) 134 MG capsule Take 1 capsule (134 mg total) by mouth daily before breakfast.  90 capsule  1  . Flaxseed, Linseed, (FLAXSEED OIL) 1200 MG CAPS Take 1 capsule by mouth daily.      . Omega-3 Fatty Acids (FISH OIL PO) Take 1 tablet by mouth daily.      . traZODone (DESYREL) 150 MG tablet TAKE 1/2-1 TABLET BY MOUTH AT BEDTIME AS NEEDED FOR SLEEP  90 tablet  0   No current facility-administered medications for this visit.    Allergies  Review of patient's allergies indicates no known allergies.  Electrocardiogram: 5/15  SR rate 78 normal   Assessment and Plan

## 2014-08-11 NOTE — Assessment & Plan Note (Signed)
Cholesterol is at goal.  Continue current dose of statin and diet Rx.  No myalgias or side effects.  F/U  LFT's in 6 months. Lab Results  Component Value Date   LDLCALC 99 05/13/2014

## 2014-08-11 NOTE — Patient Instructions (Signed)
Your physician has requested that you have an echocardiogram. Echocardiography is a painless test that uses sound waves to create images of your heart. It provides your doctor with information about the size and shape of your heart and how well your heart's chambers and valves are working. This procedure takes approximately one hour. There are no restrictions for this procedure.  Your physician wants you to follow-up in: 1 year with Dr. Blima Singer will receive a reminder letter in the mail two months in advance. If you don't receive a letter, please call our office to schedule the follow-up appointment.  Your physician recommends that you continue on your current medications as directed. Please refer to the Current Medication list given to you today.

## 2014-08-11 NOTE — Assessment & Plan Note (Signed)
Well controlled.  Continue current medications and low sodium Dash type diet.    

## 2014-08-18 ENCOUNTER — Ambulatory Visit (HOSPITAL_COMMUNITY): Payer: BC Managed Care – PPO | Attending: Cardiovascular Disease

## 2014-08-18 DIAGNOSIS — I10 Essential (primary) hypertension: Secondary | ICD-10-CM | POA: Insufficient documentation

## 2014-08-18 DIAGNOSIS — M1990 Unspecified osteoarthritis, unspecified site: Secondary | ICD-10-CM | POA: Insufficient documentation

## 2014-08-18 DIAGNOSIS — K5732 Diverticulitis of large intestine without perforation or abscess without bleeding: Secondary | ICD-10-CM | POA: Diagnosis not present

## 2014-08-18 DIAGNOSIS — J449 Chronic obstructive pulmonary disease, unspecified: Secondary | ICD-10-CM | POA: Diagnosis not present

## 2014-08-18 DIAGNOSIS — J4489 Other specified chronic obstructive pulmonary disease: Secondary | ICD-10-CM | POA: Insufficient documentation

## 2014-08-18 DIAGNOSIS — Z87891 Personal history of nicotine dependence: Secondary | ICD-10-CM | POA: Insufficient documentation

## 2014-08-18 DIAGNOSIS — E785 Hyperlipidemia, unspecified: Secondary | ICD-10-CM | POA: Insufficient documentation

## 2014-08-18 DIAGNOSIS — R0609 Other forms of dyspnea: Secondary | ICD-10-CM | POA: Diagnosis not present

## 2014-08-18 DIAGNOSIS — I359 Nonrheumatic aortic valve disorder, unspecified: Secondary | ICD-10-CM

## 2014-08-18 DIAGNOSIS — I350 Nonrheumatic aortic (valve) stenosis: Secondary | ICD-10-CM

## 2014-08-18 DIAGNOSIS — R0989 Other specified symptoms and signs involving the circulatory and respiratory systems: Secondary | ICD-10-CM | POA: Insufficient documentation

## 2014-08-18 NOTE — Progress Notes (Signed)
2D Echo completed. 08/18/2014 

## 2014-08-20 ENCOUNTER — Telehealth: Payer: Self-pay | Admitting: Cardiovascular Disease

## 2014-08-20 NOTE — Telephone Encounter (Signed)
LMTCB ./CY 

## 2014-08-20 NOTE — Telephone Encounter (Signed)
New message     Want echo results.  He will be available until 9:30

## 2014-08-21 NOTE — Telephone Encounter (Signed)
Follow Up  Pt called for ECHO results. Please call

## 2014-08-21 NOTE — Telephone Encounter (Signed)
LMTCB ./CY 

## 2014-08-22 NOTE — Telephone Encounter (Signed)
PT AWARE OF ECHO RESULTS./CY 

## 2014-08-31 ENCOUNTER — Other Ambulatory Visit (HOSPITAL_COMMUNITY): Payer: Self-pay | Admitting: Physician Assistant

## 2014-09-06 ENCOUNTER — Other Ambulatory Visit: Payer: Self-pay | Admitting: Physician Assistant

## 2014-09-29 ENCOUNTER — Encounter: Payer: Self-pay | Admitting: Physician Assistant

## 2014-09-29 DIAGNOSIS — G894 Chronic pain syndrome: Secondary | ICD-10-CM

## 2014-11-05 ENCOUNTER — Other Ambulatory Visit: Payer: Self-pay | Admitting: Physician Assistant

## 2014-12-01 ENCOUNTER — Other Ambulatory Visit: Payer: Self-pay | Admitting: *Deleted

## 2014-12-01 MED ORDER — TRAZODONE HCL 150 MG PO TABS: ORAL_TABLET | ORAL | Status: DC

## 2015-01-20 ENCOUNTER — Encounter: Payer: Self-pay | Admitting: Physician Assistant

## 2015-03-08 DIAGNOSIS — Z79899 Other long term (current) drug therapy: Secondary | ICD-10-CM | POA: Insufficient documentation

## 2015-03-08 DIAGNOSIS — E559 Vitamin D deficiency, unspecified: Secondary | ICD-10-CM | POA: Insufficient documentation

## 2015-03-08 DIAGNOSIS — R7309 Other abnormal glucose: Secondary | ICD-10-CM | POA: Insufficient documentation

## 2015-03-08 DIAGNOSIS — R7303 Prediabetes: Secondary | ICD-10-CM

## 2015-03-08 NOTE — Progress Notes (Signed)
Patient ID: Anthony Paul, male   DOB: 1956-07-14, 59 y.o.   MRN: [id number] Annual Comprehensive Examination  This very nice 59 y.o. MWM presents for complete physical.  Patient has been followed for HTN, Prediabetes, Hyperlipidemia, and Vitamin D Deficiency.   Patient Patient has long hx/o query bilateral upper abdominal pains and has had extensive and exhaustive w/u by Dr Earlean Shawl predating back to 2009 and finally was referred to pain management and he's managed by Paralee Cancel, PA-C. In Jan 2011, pt had a sigmoid colectomy by Dr Harlow Asa and his afore mentioned pains had not improved. He now relates 4-6 weeks of bilateral lower abdominal pains Rt>Lt along the inguinal lines bilaterally. He denies htburn/dyspepsia/N/V/D/C/cramping or relationship of discomfort to food ingestion or evacuation, but does describe a positional element with squatting, kneeling or bending over. Patient has long hx/o labile HTN and has been monitored expectantly. Patient's BP's have been controlled at home.Today's BP: 122/84 mmHg.  Patient has hx/o a Bicuspid AoV w/predominant AR and is followed annually by [name]  with serial Echocardiograms. Patient denies any cardiac symptoms as chest pain, palpitations, shortness of breath, dizziness or ankle swelling.   Patient's hyperlipidemia is controlled with diet and medications. Patient denies myalgias or other medication SE's. Last lipids were Total Chol 183; HDL 37; LDL  99; Trig 237 on 05/13/2014.   Patient has prediabetes and patient denies reactive hypoglycemic symptoms, visual blurring, diabetic polys or paresthesias. Last A1c was 5.8% on 05/13/2014.   Finally, patient has history of Vitamin D Deficiency and last vitamin D was  82 on 05/13/2014.   Medication Sig  . acetaminophen (TYLENOL) 500 MG tablet Take 1,500 mg by mouth every 6 (six) hours as needed for mild pain.  Marland Kitchen aspirin 81 MG tablet Take 81 mg by mouth daily.   . celecoxib (CELEBREX) 200 MG capsule TAKE 1 CAPSULE BY  MOUTH 2 TIMES DAILY.  . fenofibrate micronized (LOFIBRA) 134 MG capsule TAKE 1 CAPSULE (134 MG TOTAL) BY MOUTH DAILY BEFORE BREAKFAST.  Marland Kitchen Flaxseed, Linseed, (FLAXSEED OIL) 1200 MG CAPS Take 1 capsule by mouth daily.  . Omega-3 Fatty Acids (FISH OIL PO) Take 1 tablet by mouth daily.  . Cholecalciferol (VITAMIN D PO) Take 2 tablets by mouth daily.   . traZODone (DESYREL) 150 MG tablet TAKE 1/2-1 TABLET BY MOUTH AT BEDTIME AS NEEDED FOR SLEEP   No Known Allergies   Past Medical History  Diagnosis Date  . Diverticulitis   . Skin cancer   . Hypercholesteremia   . Abnormal CT scan, chest     Blebs on CT chest but NORMAL PFTs- no COPD  . ED (erectile dysfunction)   . DJD (degenerative joint disease)   . 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 ? Bicuspid valve with dilated aortic root 4.0 cm.  . Elevated blood pressure reading without diagnosis of hypertension   . Aortic stenosis    Health Maintenance  Topic Date Due  . HIV Screening  01/25/1970  . INFLUENZA VACCINE  07/19/2014  . TETANUS/TDAP  12/19/2014  . ZOSTAVAX  01/25/2015  . COLONOSCOPY  12/20/2015   Immunization History  Administered Date(s) Administered  . Influenza,inj,Quad PF,36+ Mos 11/15/2013  . Influenza-Unspecified 12/19/2009  . PPD Test 03/09/2015  . Pneumococcal Conjugate-13 11/15/2013  . Pneumococcal-Unspecified 12/20/1995  . Td 12/19/2004  . Tdap 03/09/2015   Past Surgical History  Procedure Laterality Date  . Colon surgery  Jan 2012  . Tonsillectomy    . Hemorroidectomy    . Shoulder surgery    . Colonoscopy  12/2013    Dr. Earlean Shawl, due every 5-7 years  . Upper gi endoscopy  2015    Dr. Earlean Shawl   Family History  Problem Relation Age of Onset  . Heart disease Father   . Benign prostatic hyperplasia Father    History   Social History  . Marital Status: Married    Spouse Name: N/A  . Number of Children: 3  . Years  of Education: N/A   Occupational History  . DRIVER [employer]   Social History Main Topics  . Smoking status: Former Smoker -- 0.50 packs/day    Types: Cigarettes    Quit date: 07/19/2013  . Smokeless tobacco: Former Systems developer    Types: Chew    Quit date: 12/19/2006     Comment: Started smoking at age 56  . Alcohol Use: 3.0 oz/week    5 Standard drinks or equivalent per week     Comment: 1-2 drinks per day  . Drug Use: No  . Sexual Activity: Active    ROS Constitutional: Denies fever, chills, weight loss/gain, headaches, insomnia, fatigue, night sweats or change in appetite. Eyes: Denies redness, blurred vision, diplopia, discharge, itchy or watery eyes.  ENT: Denies discharge, congestion, post nasal drip, epistaxis, sore throat, earache, hearing loss, dental pain, Tinnitus, Vertigo, Sinus pain or snoring.  Cardio: Denies chest pain, palpitations, irregular heartbeat, syncope, dyspnea, diaphoresis, orthopnea, PND, claudication or edema Respiratory: denies cough, dyspnea, DOE, pleurisy, hoarseness, laryngitis or wheezing.  Gastrointestinal: Denies dysphagia, heartburn, reflux, water brash, pain, cramps, nausea, vomiting, bloating, diarrhea, constipation, hematemesis, melena, hematochezia, jaundice or hemorrhoids Genitourinary: Denies dysuria, frequency, urgency, nocturia, hesitancy, discharge, hematuria or flank pain Musculoskeletal: Denies arthralgia, myalgia, stiffness, Jt. Swelling, pain, limp or strain/sprain. Denies Falls. Skin: Denies puritis, rash, hives, warts, acne, eczema or change in skin lesion Neuro: No weakness, tremor, incoordination, spasms, paresthesia or pain Psychiatric: Denies confusion, memory loss or sensory loss. Denies Depression. Endocrine: Denies change in weight, skin, hair change, nocturia, and paresthesia, diabetic polys, visual blurring or hyper / hypo glycemic episodes.  Heme/Lymph: No excessive bleeding, bruising or enlarged lymph nodes.  Physical Exam  BP  122/84   Pulse 80  Temp 97.3 F   Resp 16  Ht 6' 2.75"   Wt 218 lb 11.2 oz     BMI 27.51   General Appearance: Well nourished, in no apparent distress. Eyes: PERRLA, EOMs, conjunctiva no swelling or erythema, normal fundi and vessels. Sinuses: No frontal/maxillary tenderness ENT/Mouth: EACs patent / TMs  nl. Nares clear without erythema, swelling, mucoid exudates. Oral hygiene is good. No erythema, swelling, or exudate. Tongue normal, non-obstructing. Tonsils not swollen or erythematous. Hearing normal.  Neck: Supple, thyroid normal. No bruits, nodes or JVD. Respiratory: Respiratory effort normal.  BS equal and clear bilateral without rales, rhonci, wheezing or stridor. Cardio: Heart sounds are normal with regular rate and rhythm and no murmurs, rubs or gallops. Peripheral pulses are normal and equal bilaterally without edema. No aortic or femoral bruits. Chest: symmetric with normal excursions and percussion.  Abdomen: Flat, soft, with bowl sounds. No hernias, masses, or organomegaly. Patient is tender to superficial pressure over the whole abdomen with out guarding or rebound evident and he's also tender along the inguinal lines bilaterally.  Lymphatics: Non tender without lymphadenopathy.  Genitourinary: No hernias. Inguinal rings intact w/o tenderness. Testes  nl. DRE - prostate nl for age - smooth & firm w/o nodules. Testes unremarkable bilaterally. Musculoskeletal: Full ROM all peripheral extremities, joint stability, 5/5 strength, and normal gait. Skin: Warm and dry without rashes, lesions, cyanosis, clubbing or  ecchymosis.  Neuro: Cranial nerves intact, reflexes equal bilaterally. Normal muscle tone, no cerebellar symptoms. Sensation intact.  Pysch: Awake and oriented X 3 with normal affect, insight and judgment appropriate.   Assessment and Plan   1. Borderline systolic HTN  - Microalbumin / creatinine urine ratio - EKG 12-Lead - [place], RETROPERITNL ABD,  LTD  2.  Prediabetes  - Hemoglobin A1c - Insulin, random  3. Mixed hyperlipidemia  - Lipid panel  4. Vitamin D deficiency  - Vit D  25 hydroxy   5. Bicuspid aortic valve   6.  COPD   7. Osteoarthritis   8. Screening for rectal cancer  - POC Hemoccult Bld/Stl (3-Cd Home Screen); Future  9. Prostate cancer screening  - PSA  10. Other fatigue  - Vitamin B12 - Testosterone - Iron and TIBC - TSH  11. Medication management  - Urine Microscopic - CBC with Differential/Platelet - BASIC METABOLIC PANEL WITH GFR - Hepatic function panel - Magnesium  12. Prostatitis, unspecified prostatitis type  - Urine culture  13. Need for prophylactic vaccination with combined diphtheria-tetanus-pertussis (DTP) vaccine  - Tdap vaccine greater than or equal to 7yo IM  14. Screening examination for pulmonary tuberculosis  - PPD  15. 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  Discussed med effects and SE's. Routine screening labs and tests as requested with regular follow-up as recommended.

## 2015-03-08 NOTE — Patient Instructions (Signed)

## 2015-03-09 ENCOUNTER — Ambulatory Visit (INDEPENDENT_AMBULATORY_CARE_PROVIDER_SITE_OTHER): Payer: BLUE CROSS/BLUE SHIELD | Admitting: Internal Medicine

## 2015-03-09 ENCOUNTER — Encounter: Payer: Self-pay | Admitting: Internal Medicine

## 2015-03-09 VITALS — BP 122/84 | HR 80 | Temp 97.3°F | Resp 16 | Ht 74.75 in | Wt 218.7 lb

## 2015-03-09 DIAGNOSIS — M1990 Unspecified osteoarthritis, unspecified site: Secondary | ICD-10-CM

## 2015-03-09 DIAGNOSIS — Z79899 Other long term (current) drug therapy: Secondary | ICD-10-CM

## 2015-03-09 DIAGNOSIS — Z1212 Encounter for screening for malignant neoplasm of rectum: Secondary | ICD-10-CM

## 2015-03-09 DIAGNOSIS — Z125 Encounter for screening for malignant neoplasm of prostate: Secondary | ICD-10-CM

## 2015-03-09 DIAGNOSIS — Z23 Encounter for immunization: Secondary | ICD-10-CM

## 2015-03-09 DIAGNOSIS — Q231 Congenital insufficiency of aortic valve: Secondary | ICD-10-CM

## 2015-03-09 DIAGNOSIS — R5383 Other fatigue: Secondary | ICD-10-CM

## 2015-03-09 DIAGNOSIS — E782 Mixed hyperlipidemia: Secondary | ICD-10-CM

## 2015-03-09 DIAGNOSIS — R7309 Other abnormal glucose: Secondary | ICD-10-CM

## 2015-03-09 DIAGNOSIS — N419 Inflammatory disease of prostate, unspecified: Secondary | ICD-10-CM

## 2015-03-09 DIAGNOSIS — Z111 Encounter for screening for respiratory tuberculosis: Secondary | ICD-10-CM

## 2015-03-09 DIAGNOSIS — J449 Chronic obstructive pulmonary disease, unspecified: Secondary | ICD-10-CM

## 2015-03-09 DIAGNOSIS — R030 Elevated blood-pressure reading, without diagnosis of hypertension: Secondary | ICD-10-CM

## 2015-03-09 DIAGNOSIS — R7303 Prediabetes: Secondary | ICD-10-CM

## 2015-03-09 DIAGNOSIS — E559 Vitamin D deficiency, unspecified: Secondary | ICD-10-CM

## 2015-03-09 LAB — CBC WITH DIFFERENTIAL/PLATELET
BASOS PCT: 0 % (ref 0–1)
Basophils Absolute: 0 10*3/uL (ref 0.0–0.1)
Eosinophils Absolute: 0.1 10*3/uL (ref 0.0–0.7)
Eosinophils Relative: 3 % (ref 0–5)
HEMATOCRIT: 44.8 % (ref 39.0–52.0)
Hemoglobin: 15.3 g/dL (ref 13.0–17.0)
Lymphocytes Relative: 24 % (ref 12–46)
Lymphs Abs: 1.2 10*3/uL (ref 0.7–4.0)
MCH: 31.2 pg (ref 26.0–34.0)
MCHC: 34.2 g/dL (ref 30.0–36.0)
MCV: 91.4 fL (ref 78.0–100.0)
MONOS PCT: 12 % (ref 3–12)
MPV: 11.3 fL (ref 8.6–12.4)
Monocytes Absolute: 0.6 10*3/uL (ref 0.1–1.0)
NEUTROS ABS: 3 10*3/uL (ref 1.7–7.7)
NEUTROS PCT: 61 % (ref 43–77)
Platelets: 176 10*3/uL (ref 150–400)
RBC: 4.9 MIL/uL (ref 4.22–5.81)
RDW: 13.6 % (ref 11.5–15.5)
WBC: 4.9 10*3/uL (ref 4.0–10.5)

## 2015-03-09 LAB — TSH: TSH: 3.253 u[IU]/mL (ref 0.350–4.500)

## 2015-03-09 LAB — VITAMIN B12: Vitamin B-12: 221 pg/mL (ref 211–911)

## 2015-03-09 LAB — HEMOGLOBIN A1C
Hgb A1c MFr Bld: 5.8 % — ABNORMAL HIGH (ref <5.7)
Mean Plasma Glucose: 120 mg/dL — ABNORMAL HIGH (ref <117)

## 2015-03-09 LAB — BASIC METABOLIC PANEL WITH GFR
BUN: 16 mg/dL (ref 6–23)
CHLORIDE: 106 meq/L (ref 96–112)
CO2: 24 meq/L (ref 19–32)
Calcium: 9.7 mg/dL (ref 8.4–10.5)
Creat: 1.39 mg/dL — ABNORMAL HIGH (ref 0.50–1.35)
GFR, EST NON AFRICAN AMERICAN: 55 mL/min — AB
GFR, Est African American: 63 mL/min
Glucose, Bld: 102 mg/dL — ABNORMAL HIGH (ref 70–99)
POTASSIUM: 4.7 meq/L (ref 3.5–5.3)
SODIUM: 139 meq/L (ref 135–145)

## 2015-03-09 LAB — IRON AND TIBC
%SAT: 33 % (ref 20–55)
Iron: 106 ug/dL (ref 42–165)
TIBC: 325 ug/dL (ref 215–435)
UIBC: 219 ug/dL (ref 125–400)

## 2015-03-09 LAB — HEPATIC FUNCTION PANEL
ALBUMIN: 4.3 g/dL (ref 3.5–5.2)
ALK PHOS: 54 U/L (ref 39–117)
ALT: 14 U/L (ref 0–53)
AST: 13 U/L (ref 0–37)
BILIRUBIN DIRECT: 0.1 mg/dL (ref 0.0–0.3)
BILIRUBIN INDIRECT: 0.3 mg/dL (ref 0.2–1.2)
Total Bilirubin: 0.4 mg/dL (ref 0.2–1.2)
Total Protein: 6.3 g/dL (ref 6.0–8.3)

## 2015-03-09 LAB — LIPID PANEL
Cholesterol: 164 mg/dL (ref 0–200)
HDL: 33 mg/dL — ABNORMAL LOW (ref >=40)
LDL CALC: 109 mg/dL — AB (ref 0–99)
TRIGLYCERIDES: 109 mg/dL (ref <150)
Total CHOL/HDL Ratio: 5 Ratio
VLDL: 22 mg/dL (ref 0–40)

## 2015-03-09 LAB — MAGNESIUM: MAGNESIUM: 1.9 mg/dL (ref 1.5–2.5)

## 2015-03-10 LAB — URINALYSIS, MICROSCOPIC ONLY
Bacteria, UA: NONE SEEN
CRYSTALS: NONE SEEN
Casts: NONE SEEN
Squamous Epithelial / LPF: NONE SEEN

## 2015-03-10 LAB — MICROALBUMIN / CREATININE URINE RATIO
Creatinine, Urine: 58.1 mg/dL
Microalb, Ur: <0.2 mg/dL (ref <2.0)

## 2015-03-10 LAB — PSA: PSA: 0.58 ng/mL (ref <=4.00)

## 2015-03-10 LAB — URINE CULTURE
COLONY COUNT: NO GROWTH
ORGANISM ID, BACTERIA: NO GROWTH

## 2015-03-10 LAB — VITAMIN D 25 HYDROXY (VIT D DEFICIENCY, FRACTURES): VIT D 25 HYDROXY: 63 ng/mL (ref 30–100)

## 2015-03-10 LAB — TESTOSTERONE: Testosterone: 611 ng/dL (ref 300–890)

## 2015-03-10 LAB — INSULIN, RANDOM: Insulin: 6.5 u[IU]/mL (ref 2.0–19.6)

## 2015-04-07 ENCOUNTER — Ambulatory Visit (INDEPENDENT_AMBULATORY_CARE_PROVIDER_SITE_OTHER): Payer: BLUE CROSS/BLUE SHIELD | Admitting: Physician Assistant

## 2015-04-07 ENCOUNTER — Encounter: Payer: Self-pay | Admitting: Physician Assistant

## 2015-04-07 VITALS — BP 126/70 | HR 88 | Temp 98.2°F | Resp 16 | Ht 74.75 in | Wt 214.0 lb

## 2015-04-07 DIAGNOSIS — K4090 Unilateral inguinal hernia, without obstruction or gangrene, not specified as recurrent: Secondary | ICD-10-CM

## 2015-04-07 NOTE — Patient Instructions (Signed)

## 2015-04-07 NOTE — Progress Notes (Signed)
   Subjective:    Patient ID: Anthony Paul, male    DOB: 04/13/1956, 59 y.o.   MRN: [id number]  HPI 59 y.o. WM with right inguinal/groin pain x 2-3 month. He states that he had a CPE a month ago, mentioned it and labs were normal. It is getting worse. Worse with sitting for a long time, pulling from his right testicle when he bends over/leans over, better with lying down.  Has had increase gas the last 6 weeks. Has weak stream and occ with BM will have clear liquid that looks like an ejaculate. Does not recall an injury. No bulge in that area. Denies swelling, tenderness in right testicle, nausea, decrease appetite, fever, chills, diarrhea, constipation.   Review of Systems  Constitutional: Negative.  Negative for fever, chills, appetite change and fatigue.  HENT: Negative.   Respiratory: Negative.   Cardiovascular: Negative.   Gastrointestinal: Positive for abdominal pain (right suprapubic). Negative for nausea, diarrhea, constipation, blood in stool, abdominal distention and anal bleeding.  Genitourinary: Positive for discharge. Negative for dysuria, urgency, frequency, hematuria, flank pain, decreased urine volume, penile swelling, scrotal swelling, enuresis, difficulty urinating, genital sores, penile pain and testicular pain.  Musculoskeletal: Negative.   Skin: Negative.   Neurological: Negative.   Psychiatric/Behavioral: Negative.        Objective:   Physical Exam  Constitutional: He is oriented to person, place, and time. He appears well-developed and well-nourished. No distress.  Neck: Normal range of motion. Neck supple.  Cardiovascular: Normal rate, regular rhythm and normal heart sounds.   Pulmonary/Chest: Effort normal and breath sounds normal.  Abdominal: Soft. Bowel sounds are normal. He exhibits no distension. There is tenderness. There is no rigidity, no rebound and no guarding. A hernia is present. Hernia confirmed negative in the right inguinal area.    Genitourinary:  Testes normal and penis normal. Right testis shows no swelling and no tenderness.  Musculoskeletal: Normal range of motion.  Lymphadenopathy:       Right: No inguinal adenopathy present.  Neurological: He is alert and oriented to person, place, and time.  Skin: Skin is warm and dry. No rash noted.       Assessment & Plan:  Direct right Inguinal hernia, not strangulated- Will refer to general surgery for evaluation due to discomfort Go to Er if any worsening pain, nausea, vomiting, constipation.

## 2015-06-29 ENCOUNTER — Ambulatory Visit (INDEPENDENT_AMBULATORY_CARE_PROVIDER_SITE_OTHER): Payer: BLUE CROSS/BLUE SHIELD | Admitting: Internal Medicine

## 2015-06-29 ENCOUNTER — Encounter: Payer: Self-pay | Admitting: Internal Medicine

## 2015-06-29 VITALS — BP 132/70 | HR 78 | Temp 98.2°F | Resp 18 | Ht 74.75 in | Wt 215.0 lb

## 2015-06-29 DIAGNOSIS — Q231 Congenital insufficiency of aortic valve: Secondary | ICD-10-CM

## 2015-06-29 DIAGNOSIS — E559 Vitamin D deficiency, unspecified: Secondary | ICD-10-CM

## 2015-06-29 DIAGNOSIS — Z79899 Other long term (current) drug therapy: Secondary | ICD-10-CM

## 2015-06-29 DIAGNOSIS — E782 Mixed hyperlipidemia: Secondary | ICD-10-CM

## 2015-06-29 DIAGNOSIS — R7309 Other abnormal glucose: Secondary | ICD-10-CM

## 2015-06-29 DIAGNOSIS — E663 Overweight: Secondary | ICD-10-CM

## 2015-06-29 DIAGNOSIS — R7303 Prediabetes: Secondary | ICD-10-CM

## 2015-06-29 LAB — CBC WITH DIFFERENTIAL/PLATELET
BASOS ABS: 0 10*3/uL (ref 0.0–0.1)
BASOS PCT: 0 % (ref 0–1)
EOS PCT: 4 % (ref 0–5)
Eosinophils Absolute: 0.3 10*3/uL (ref 0.0–0.7)
HCT: 45.7 % (ref 39.0–52.0)
Hemoglobin: 15.3 g/dL (ref 13.0–17.0)
LYMPHS ABS: 1.9 10*3/uL (ref 0.7–4.0)
LYMPHS PCT: 29 % (ref 12–46)
MCH: 31.4 pg (ref 26.0–34.0)
MCHC: 33.5 g/dL (ref 30.0–36.0)
MCV: 93.6 fL (ref 78.0–100.0)
MONO ABS: 0.8 10*3/uL (ref 0.1–1.0)
MPV: 11.2 fL (ref 8.6–12.4)
Monocytes Relative: 12 % (ref 3–12)
Neutro Abs: 3.5 10*3/uL (ref 1.7–7.7)
Neutrophils Relative %: 55 % (ref 43–77)
Platelets: 188 10*3/uL (ref 150–400)
RBC: 4.88 MIL/uL (ref 4.22–5.81)
RDW: 13.9 % (ref 11.5–15.5)
WBC: 6.4 10*3/uL (ref 4.0–10.5)

## 2015-06-29 LAB — HEMOGLOBIN A1C
Hgb A1c MFr Bld: 5.6 % (ref <5.7)
MEAN PLASMA GLUCOSE: 114 mg/dL (ref <117)

## 2015-06-29 LAB — LIPID PANEL
CHOL/HDL RATIO: 5.1 ratio
CHOLESTEROL: 148 mg/dL (ref 0–200)
HDL: 29 mg/dL — AB (ref >=40)
LDL Cholesterol: 86 mg/dL (ref 0–99)
Triglycerides: 164 mg/dL — ABNORMAL HIGH (ref <150)
VLDL: 33 mg/dL (ref 0–40)

## 2015-06-29 LAB — BASIC METABOLIC PANEL WITH GFR
BUN: 17 mg/dL (ref 6–23)
CALCIUM: 9.5 mg/dL (ref 8.4–10.5)
CO2: 24 mEq/L (ref 19–32)
Chloride: 105 mEq/L (ref 96–112)
Creat: 1.25 mg/dL (ref 0.50–1.35)
GFR, EST AFRICAN AMERICAN: 72 mL/min
GFR, Est Non African American: 62 mL/min
Glucose, Bld: 88 mg/dL (ref 70–99)
POTASSIUM: 4.4 meq/L (ref 3.5–5.3)
SODIUM: 139 meq/L (ref 135–145)

## 2015-06-29 LAB — HEPATIC FUNCTION PANEL
ALT: 15 U/L (ref 0–53)
AST: 14 U/L (ref 0–37)
Albumin: 4.3 g/dL (ref 3.5–5.2)
Alkaline Phosphatase: 53 U/L (ref 39–117)
Bilirubin, Direct: 0.2 mg/dL (ref 0.0–0.3)
Indirect Bilirubin: 0.4 mg/dL (ref 0.2–1.2)
Total Bilirubin: 0.6 mg/dL (ref 0.2–1.2)
Total Protein: 6.3 g/dL (ref 6.0–8.3)

## 2015-06-29 LAB — MAGNESIUM: Magnesium: 1.9 mg/dL (ref 1.5–2.5)

## 2015-06-29 NOTE — Patient Instructions (Signed)
Ways to cut 100 calories  1. Eat your eggs with hot sauce OR salsa instead of cheese.  Eggs are great for breakfast, but many people consider eggs and cheese to be BFFs. Instead of cheese-1 oz. of cheddar has 114 calories-top your eggs with hot sauce, which contains no calories and helps with satiety and metabolism. Salsa is also a great option!!  2. Top your toast, waffles or pancakes with mashed berries instead of jelly or syrup. Half a cup of berries-fresh, frozen or thawed-has about 40 calories, compared with 2 tbsp. of maple syrup or jelly, which both have about 100 calories. The berries will also give you a good punch of fiber, which helps keep you full and satisfied and won't spike blood sugar quickly like the jelly or syrup. 3. Swap the non-fat latte for black coffee with a splash of half-and-half. Contrary to its name, that non-fat latte has 130 calories and a startling 19g of carbohydrates per 16 oz. serving. Replacing that 'light' drinkable dessert with a black coffee with a splash of half-and-half saves you more than 100 calories per 16 oz. serving. 4. Sprinkle salads with freeze-dried raspberries instead of dried cranberries. If you want a sweet addition to your nutritious salad, stay away from dried cranberries. They have a whopping 130 calories per  cup and 30g carbohydrates. Instead, sprinkle freeze-dried raspberries guilt-free and save more than 100 calories per  cup serving, adding 3g of belly-filling fiber. 5. Go for mustard in place of mayo on your sandwich. Mustard can add really nice flavor to any sandwich, and there are tons of varieties, from spicy to honey. A serving of mayo is 95 calories, versus 10 calories in a serving of mustard. 6. Choose a DIY salad dressing instead of the store-bought kind. Mix Dijon or whole grain mustard with low-fat Kefir or red wine vinegar and garlic. 7. Use hummus as a spread instead of a dip. Use hummus as a spread on a high-fiber cracker or  tortilla with a sandwich and save on calories without sacrificing taste. 8. Pick just one salad "accessory." Salad isn't automatically a calorie winner. It's easy to over-accessorize with toppings. Instead of topping your salad with nuts, avocado and cranberries (all three will clock in at 313 calories), just pick one. The next day, choose a different accessory, which will also keep your salad interesting. You don't wear all your jewelry every day, right? 9. Ditch the white pasta in favor of spaghetti squash. One cup of cooked spaghetti squash has about 40 calories, compared with traditional spaghetti, which comes with more than 200. Spaghetti squash is also nutrient-dense. It's a good source of fiber and Vitamins A and C, and it can be eaten just like you would eat pasta-with a great tomato sauce and [place] meatballs or with pesto, tofu and spinach, for example. 10. Dress up your chili, soups and stews with non-fat [place] yogurt instead of sour cream. Just a 'dollop' of sour cream can set you back 115 calories and a whopping 12g of fat-seven of which are of the artery-clogging variety. Added bonus: [place] yogurt is packed with muscle-building protein, calcium and B Vitamins. 11. Mash cauliflower instead of mashed potatoes. One cup of traditional mashed potatoes-in all their creamy goodness-has more than 200 calories, compared to mashed cauliflower, which you can typically eat for less than 100 calories per 1 cup serving. Cauliflower is a great source of the antioxidant indole-3-carbinol (I3C), which may help reduce the risk of some cancers, like breast  cancer. 12. Ditch the ice cream sundae in favor of a Greek yogurt parfait. Instead of a cup of ice cream or fro-yo for dessert, try 1 cup of nonfat Greek yogurt topped with fresh berries and a sprinkle of cacao nibs. Both toppings are packed with antioxidants, which can help reduce cellular inflammation and oxidative damage. And the comparison is a  no-brainer: One cup of ice cream has about 275 calories; one cup of frozen yogurt has about 230; and a cup of Greek yogurt has just 130, plus twice the protein, so you're less likely to return to the freezer for a second helping. 13. Put olive oil in a spray container instead of using it directly from the bottle. Each tablespoon of olive oil is 120 calories and 15g of fat. Use a mister instead of pouring it straight into the pan or onto a salad. This allows for portion control and will save you more than 100 calories. 14. When baking, substitute canned pumpkin for butter or oil. Canned pumpkin-not pumpkin pie mix-is loaded with Vitamin A, which is important for skin and eye health, as well as immunity. And the comparisons are pretty crazy:  cup of canned pumpkin has about 40 calories, compared to butter or oil, which has more than 800 calories. Yes, 800 calories. Applesauce and mashed banana can also serve as good substitutions for butter or oil, usually in a 1:1 ratio. 15. Top casseroles with high-fiber cereal instead of breadcrumbs. Breadcrumbs are typically made with white bread, while breakfast cereals contain 5-9g of fiber per serving. Not only will you save more than 150 calories per  cup serving, the swap will also keep you more full and you'll get a metabolism boost from the added fiber. 16. Snack on pistachios instead of macadamia nuts. Believe it or not, you get the same amount of calories from 35 pistachios (100 calories) as you would from only five macadamia nuts. 17. Chow down on kale chips rather than potato chips. This is my favorite 'don't knock it 'till you try it' swap. Kale chips are so easy to make at home, and you can spice them up with a little grated parmesan or chili powder. Plus, they're a mere fraction of the calories of potato chips, but with the same crunch factor we crave so often. 18. Add seltzer and some fruit slices to your cocktail instead of soda or fruit juice. One  cup of soda or fruit juice can pack on as much as 140 calories. Instead, use seltzer and fruit slices. The fruit provides valuable phytochemicals, such as flavonoids and anthocyanins, which help to combat cancer and stave off the aging process.  We want weight loss that will last so you should lose 1-2 pounds a week.  THAT IS IT! Please pick THREE things a month to change. Once it is a habit check off the item. Then pick another three items off the list to become habits.  If you are already doing a habit on the list GREAT!  Cross that item off! o Don't drink your calories. Ie, alcohol, soda, fruit juice, and sweet tea.  o Drink more water. Drink a glass when you feel hungry or before each meal.  o Eat breakfast - Complex carb and protein (likeDannon light and fit yogurt, oatmeal, fruit, eggs, turkey bacon). o Measure your cereal.  Eat no more than one cup a day. (ie Kashi) o Eat an apple a day. o Add a vegetable a day. o Try a new vegetable   a month. o Use Pam! Stop using oil or butter to cook. o Don't finish your plate or use smaller plates. o Share your dessert. o Eat sugar free Jello for dessert or frozen grapes. o Don't eat 2-3 hours before bed. o Switch to whole wheat bread, pasta, and brown rice. o Make healthier choices when you eat out. No fries! o Pick baked chicken, NOT fried. o Don't forget to SLOW DOWN when you eat. It is not going anywhere.  o Take the stairs. o Park far away in the parking lot o Lift soup cans (or weights) for 10 minutes while watching TV. o Walk at work for 10 minutes during break. o Walk outside 1 time a week with your friend, kids, dog, or significant other. o Start a walking group at church. o Walk the mall as much as you can tolerate.  o Keep a food diary. o Weigh yourself daily. o Walk for 15 minutes 3 days per week. o Cook at home more often and eat out less.  If life happens and you go back to old habits, it is okay.  Just start over. You can do  it!   If you experience chest pain, get short of breath, or tired during the exercise, please stop immediately and inform your doctor.  

## 2015-06-29 NOTE — Progress Notes (Signed)
Patient ID: Anthony Paul, male   DOB: June 04, 1956, 59 y.o.   MRN: [id number] Assessment and Plan:  Hypertension:  -cont ASA -followup with cards in September for aortic bicuspid valve.  -monitor blood pressure at home.  -Continue DASH diet.   -Reminder to go to the ER if any CP, SOB, nausea, dizziness, severe HA, changes vision/speech, left arm numbness and tingling, and jaw pain.  Cholesterol: -Continue diet and exercise.  -Check cholesterol.   Pre-diabetes: -Continue diet and exercise. -appropriate diet changes discussed  -Check A1C  Vitamin D Def: -check level -continue medications.   Continue diet and meds as discussed. Further disposition pending results of labs.  HPI 59 y.o. male  presents for 3 month follow up with hypertension, hyperlipidemia, prediabetes and vitamin D.   His blood pressure has been controlled at home, today their BP is BP: 132/70 mmHg.   He does not workout. He denies chest pain, shortness of breath, dizziness.   He is on cholesterol medication and denies myalgias. His cholesterol is not at goal. The cholesterol last visit was:   Lab Results  Component Value Date   CHOL 164 03/09/2015   HDL 33* 03/09/2015   LDLCALC 109* 03/09/2015   TRIG 109 03/09/2015   CHOLHDL 5.0 03/09/2015     He has been working on diet and exercise for prediabetes, and denies foot ulcerations, hyperglycemia, hypoglycemia , increased appetite, nausea, paresthesia of the feet, polydipsia, polyuria, visual disturbances, vomiting and weight loss. Last A1C in the office was:  Lab Results  Component Value Date   HGBA1C 5.8* 03/09/2015    Patient is on Vitamin D supplement.  Lab Results  Component Value Date   VD25OH 53 03/09/2015     Patient does report that he went to general surgery and he was diagnosed with a hernia.  He has not scheduled a surgery date as of yet.    Current Medications:  Current Outpatient Prescriptions on File Prior to Visit  Medication Sig Dispense  Refill  . acetaminophen (TYLENOL) 500 MG tablet Take 1,500 mg by mouth every 6 (six) hours as needed for mild pain.    Marland Kitchen aspirin 81 MG tablet Take 81 mg by mouth daily.     . baclofen (LIORESAL) 10 MG tablet   3  . Cholecalciferol (VITAMIN D) 2000 UNITS CAPS Take by mouth.    . fenofibrate micronized (LOFIBRA) 134 MG capsule TAKE 1 CAPSULE (134 MG TOTAL) BY MOUTH DAILY BEFORE BREAKFAST. 90 capsule 1  . Flaxseed, Linseed, (FLAXSEED OIL) 1200 MG CAPS Take 1 capsule by mouth daily.    Marland Kitchen HYDROCODONE-ACETAMINOPHEN PO Take by mouth daily. RX from pain management    . Omega-3 Fatty Acids (FISH OIL PO) Take 1 tablet by mouth daily.    . traZODone (DESYREL) 150 MG tablet Take 150 mg by mouth.     No current facility-administered medications on file prior to visit.    Medical History:  Past Medical History  Diagnosis Date  . Diverticulitis   . Skin cancer   . Hypercholesteremia   . Abnormal CT scan, chest     Blebs on CT chest but NORMAL PFTs- no COPD  . ED (erectile dysfunction)   . DJD (degenerative joint disease)   . 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 ? Bicuspid valve with dilated aortic root 4.0 cm.  . Elevated blood pressure  reading without diagnosis of hypertension   . Aortic stenosis     Allergies: No Known Allergies   Review of Systems:  Review of Systems  Constitutional: Negative for fever, chills and malaise/fatigue.  HENT: Negative for congestion, ear pain and sore throat.   Eyes: Negative.   Respiratory: Negative for cough, shortness of breath and wheezing.   Cardiovascular: Negative for chest pain, palpitations and leg swelling.  Gastrointestinal: Negative for heartburn, diarrhea, constipation, blood in stool and melena.  Genitourinary: Negative.   Skin: Negative.   Neurological: Negative for dizziness, tingling, sensory change and headaches.  Psychiatric/Behavioral: Negative for  depression. The patient is not nervous/anxious and does not have insomnia.     Family history- Review and unchanged  Social history- Review and unchanged  Physical Exam: BP 132/70 mmHg  Pulse 78  Temp(Src) 98.2 F (36.8 C) (Temporal)  Resp 18  Ht 6' 2.75" (1.899 m)  Wt 215 lb (97.523 kg)  BMI 27.04 kg/m2 Wt Readings from Last 3 Encounters:  06/29/15 215 lb (97.523 kg)  04/07/15 214 lb (97.07 kg)  03/09/15 218 lb 11.2 oz (99.202 kg)    General Appearance: Well nourished well developed, in no apparent distress. Eyes: PERRLA, EOMs, conjunctiva no swelling or erythema ENT/Mouth: Ear canals normal without obstruction, swelling, erythma, discharge.  TMs normal bilaterally.  Oropharynx moist, clear, without exudate, or postoropharyngeal swelling. Neck: Supple, thyroid normal,no cervical adenopathy  Respiratory: Respiratory effort normal, Breath sounds clear A&P without rhonchi, wheeze, or rale.  No retractions, no accessory usage. Cardio: RRR with no MRGs. Brisk peripheral pulses without edema.  Abdomen: Soft, + BS,  Mild generalized tenderness to palpation, no guarding, rebound, masses. Musculoskeletal: Full ROM, 5/5 strength, Normal gait Skin: Warm, dry without rashes, lesions, ecchymosis.  Neuro: Awake and oriented X 3, Cranial nerves intact. Normal muscle tone, no cerebellar symptoms. Psych: Normal affect, Insight and Judgment appropriate.    [name], PA-C 8:59 AM [facility]

## 2015-06-30 LAB — INSULIN, RANDOM: INSULIN: 5.5 u[IU]/mL (ref 2.0–19.6)

## 2015-06-30 LAB — VITAMIN D 25 HYDROXY (VIT D DEFICIENCY, FRACTURES): Vit D, 25-Hydroxy: 64 ng/mL (ref 30–100)

## 2015-09-04 NOTE — Progress Notes (Signed)
Patient ID: Anthony Paul, male   DOB: Apr 11, 1956, 59 y.o.   MRN: [id number]   59 y.o. referred by Dr Melford Aase for murmur in 2014  . 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 He indicates seeing Dr Vidal Schwalbe and having echo but these records not available. Had CT scan for some lower lung and abdominal pain and valve noted to be calcified. 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 ? Bicuspid valve with dilated aortic root 4.0 cm. Patient has mild exertional dyspnea. Some muscular sounding chest pain. No palpitations or syncope. Sees dentist twice/yearr. Previous smoker. CRF elevated BP and cholesterol MRI with no coarctation and ascending aorta 3.9 cm  Doing well still driving for [employer] to [place] Was asking about TAVR and I told him it wasn't for bicuspid valves  Echo 9/14 with mild AS and moderate AR gradients 16/27 mmHg Echo 8/15 normal EF gradients 12/25 mmHg mild AR   Having chronic stomach pain  Not related to food and not like IBS or iscemic colitis  ROS: Denies fever, malais, weight loss, blurry vision, decreased visual acuity, cough, sputum, SOB, hemoptysis, pleuritic pain, palpitaitons, heartburn, abdominal pain, melena, lower extremity edema, claudication, or rash.  All other systems reviewed and negative  General: Affect appropriate Healthy:  appears stated age 59: normal Neck supple with no adenopathy JVP normal no bruits no thyromegaly Lungs clear with no wheezing and good diaphragmatic motion Heart:  S1/S2 AS/AR murmur, no rub, gallop or click PMI normal Abdomen: benighn, BS positve, no tenderness, no AAA no bruit.  No HSM or HJR Distal pulses intact with no bruits No edema Neuro non-focal Skin warm and dry No muscular weakness   Current Outpatient Prescriptions  Medication Sig Dispense Refill  . acetaminophen (TYLENOL) 500 MG tablet Take 1,500 mg by mouth every 6 (six) hours as  needed for mild pain.    Marland Kitchen aspirin 81 MG tablet Take 81 mg by mouth daily.     . baclofen (LIORESAL) 10 MG tablet Take 10 mg by mouth 2 (two) times daily.   3  . Cholecalciferol (VITAMIN D) 2000 UNITS CAPS Take 1 capsule by mouth daily.     . fenofibrate micronized (LOFIBRA) 134 MG capsule TAKE 1 CAPSULE (134 MG TOTAL) BY MOUTH DAILY BEFORE BREAKFAST. 90 capsule 1  . Flaxseed, Linseed, (FLAXSEED OIL) 1200 MG CAPS Take 1 capsule by mouth daily.    Marland Kitchen HYDROcodone-acetaminophen (NORCO/VICODIN) 5-325 MG per tablet Take 1 tablet by mouth 3 (three) times daily as needed. For pain  0  . Omega-3 Fatty Acids (FISH OIL PO) Take 1 tablet by mouth daily.    . traZODone (DESYREL) 150 MG tablet Take 150 mg by mouth at bedtime.      No current facility-administered medications for this visit.    Allergies  Morphine  Electrocardiogram: 5/15  SR rate 78 normal   Assessment and Plan  Bicuspid Aortic Valve: mild by exam f/u echo no need for SBE GI:  Seeing pain clinic CT with no pathology low risk for SMA disease  F/u GI Elevated Triglycerides:  Continue fenobibrate  Labs with primary

## 2015-09-07 ENCOUNTER — Ambulatory Visit (INDEPENDENT_AMBULATORY_CARE_PROVIDER_SITE_OTHER): Payer: BLUE CROSS/BLUE SHIELD | Admitting: Cardiovascular Disease

## 2015-09-07 ENCOUNTER — Encounter: Payer: Self-pay | Admitting: Cardiovascular Disease

## 2015-09-07 VITALS — BP 126/60 | HR 91 | Ht 73.0 in | Wt 216.0 lb

## 2015-09-07 DIAGNOSIS — Q231 Congenital insufficiency of aortic valve: Secondary | ICD-10-CM

## 2015-09-07 DIAGNOSIS — I350 Nonrheumatic aortic (valve) stenosis: Secondary | ICD-10-CM | POA: Diagnosis not present

## 2015-09-07 NOTE — Patient Instructions (Signed)
Medication Instructions: Your physician recommends that you continue on your current medications as directed. Please refer to the Current Medication list given to you today.   Labwork: NONE  Testing/Procedures: Your physician has requested that you have an echocardiogram. Echocardiography is a painless test that uses sound waves to create images of your heart. It provides your doctor with information about the size and shape of your heart and how well your heart's chambers and valves are working. This procedure takes approximately one hour. There are no restrictions for this procedure.   Follow-Up: Your physician wants you to follow-up in: [place] will receive a reminder letter in the mail two months in advance. If you don't receive a letter, please call our office to schedule the follow-up appointment.   Any Other Special Instructions Will Be Listed Below (If Applicable).

## 2015-09-21 ENCOUNTER — Ambulatory Visit (HOSPITAL_COMMUNITY): Payer: BLUE CROSS/BLUE SHIELD | Attending: Cardiology

## 2015-09-21 ENCOUNTER — Other Ambulatory Visit: Payer: Self-pay

## 2015-09-21 DIAGNOSIS — I340 Nonrheumatic mitral (valve) insufficiency: Secondary | ICD-10-CM | POA: Diagnosis not present

## 2015-09-21 DIAGNOSIS — E785 Hyperlipidemia, unspecified: Secondary | ICD-10-CM | POA: Insufficient documentation

## 2015-09-21 DIAGNOSIS — Q231 Congenital insufficiency of aortic valve: Secondary | ICD-10-CM | POA: Insufficient documentation

## 2015-09-21 DIAGNOSIS — I352 Nonrheumatic aortic (valve) stenosis with insufficiency: Secondary | ICD-10-CM | POA: Insufficient documentation

## 2015-09-21 DIAGNOSIS — I10 Essential (primary) hypertension: Secondary | ICD-10-CM | POA: Diagnosis not present

## 2015-09-21 DIAGNOSIS — I517 Cardiomegaly: Secondary | ICD-10-CM | POA: Diagnosis not present

## 2015-09-21 DIAGNOSIS — Z87891 Personal history of nicotine dependence: Secondary | ICD-10-CM | POA: Diagnosis not present

## 2015-09-21 DIAGNOSIS — I071 Rheumatic tricuspid insufficiency: Secondary | ICD-10-CM | POA: Diagnosis not present

## 2015-09-24 ENCOUNTER — Telehealth: Payer: Self-pay | Admitting: Cardiovascular Disease

## 2015-09-24 NOTE — Telephone Encounter (Signed)
PT AWARE OF ECHO RESULTS./CY 

## 2015-09-24 NOTE — Telephone Encounter (Signed)
F/u  Pt returning Anthony Paul's phone call.

## 2015-10-05 ENCOUNTER — Encounter: Payer: Self-pay | Admitting: Internal Medicine

## 2015-10-05 ENCOUNTER — Ambulatory Visit (INDEPENDENT_AMBULATORY_CARE_PROVIDER_SITE_OTHER): Payer: BLUE CROSS/BLUE SHIELD | Admitting: Internal Medicine

## 2015-10-05 VITALS — BP 116/72 | HR 76 | Temp 97.5°F | Resp 16 | Ht 74.75 in | Wt 220.6 lb

## 2015-10-05 DIAGNOSIS — Z79899 Other long term (current) drug therapy: Secondary | ICD-10-CM

## 2015-10-05 DIAGNOSIS — E782 Mixed hyperlipidemia: Secondary | ICD-10-CM

## 2015-10-05 DIAGNOSIS — E559 Vitamin D deficiency, unspecified: Secondary | ICD-10-CM

## 2015-10-05 DIAGNOSIS — R7303 Prediabetes: Secondary | ICD-10-CM | POA: Diagnosis not present

## 2015-10-05 DIAGNOSIS — R030 Elevated blood-pressure reading, without diagnosis of hypertension: Secondary | ICD-10-CM

## 2015-10-05 DIAGNOSIS — Z23 Encounter for immunization: Secondary | ICD-10-CM

## 2015-10-05 LAB — HEPATIC FUNCTION PANEL
ALBUMIN: 4.2 g/dL (ref 3.6–5.1)
ALK PHOS: 51 U/L (ref 40–115)
ALT: 20 U/L (ref 9–46)
AST: 14 U/L (ref 10–35)
BILIRUBIN DIRECT: 0.1 mg/dL (ref <=0.2)
BILIRUBIN TOTAL: 0.5 mg/dL (ref 0.2–1.2)
Indirect Bilirubin: 0.4 mg/dL (ref 0.2–1.2)
Total Protein: 6.5 g/dL (ref 6.1–8.1)

## 2015-10-05 LAB — BASIC METABOLIC PANEL WITH GFR
BUN: 16 mg/dL (ref 7–25)
CHLORIDE: 104 mmol/L (ref 98–110)
CO2: 25 mmol/L (ref 20–31)
Calcium: 9.4 mg/dL (ref 8.6–10.3)
Creat: 1.26 mg/dL — ABNORMAL HIGH (ref 0.70–1.25)
GFR, Est African American: 71 mL/min (ref >=60)
GFR, Est Non African American: 62 mL/min (ref >=60)
Glucose, Bld: 108 mg/dL — ABNORMAL HIGH (ref 65–99)
POTASSIUM: 4.1 mmol/L (ref 3.5–5.3)
Sodium: 139 mmol/L (ref 135–146)

## 2015-10-05 LAB — CBC WITH DIFFERENTIAL/PLATELET
BASOS ABS: 0 10*3/uL (ref 0.0–0.1)
BASOS PCT: 0 % (ref 0–1)
Eosinophils Absolute: 0.2 10*3/uL (ref 0.0–0.7)
Eosinophils Relative: 3 % (ref 0–5)
HEMATOCRIT: 45.5 % (ref 39.0–52.0)
Hemoglobin: 15.3 g/dL (ref 13.0–17.0)
Lymphocytes Relative: 22 % (ref 12–46)
Lymphs Abs: 1.5 10*3/uL (ref 0.7–4.0)
MCH: 31.2 pg (ref 26.0–34.0)
MCHC: 33.6 g/dL (ref 30.0–36.0)
MCV: 92.9 fL (ref 78.0–100.0)
MPV: 10.3 fL (ref 8.6–12.4)
Monocytes Absolute: 0.9 10*3/uL (ref 0.1–1.0)
Monocytes Relative: 13 % — ABNORMAL HIGH (ref 3–12)
NEUTROS ABS: 4.2 10*3/uL (ref 1.7–7.7)
NEUTROS PCT: 62 % (ref 43–77)
Platelets: 184 10*3/uL (ref 150–400)
RBC: 4.9 MIL/uL (ref 4.22–5.81)
RDW: 13.7 % (ref 11.5–15.5)
WBC: 6.7 10*3/uL (ref 4.0–10.5)

## 2015-10-05 LAB — LIPID PANEL
Cholesterol: 183 mg/dL (ref 125–200)
HDL: 37 mg/dL — ABNORMAL LOW (ref >=40)
LDL Cholesterol: 116 mg/dL (ref <130)
Total CHOL/HDL Ratio: 4.9 Ratio (ref <=5.0)
Triglycerides: 148 mg/dL (ref <150)
VLDL: 30 mg/dL (ref <30)

## 2015-10-05 LAB — HEMOGLOBIN A1C
HEMOGLOBIN A1C: 5.7 % — AB (ref <5.7)
MEAN PLASMA GLUCOSE: 117 mg/dL — AB (ref <117)

## 2015-10-05 NOTE — Progress Notes (Signed)
Patient ID: BURCH MARCHUK, male   DOB: 18-Apr-1956, 59 y.o.   MRN: [id number]  Assessment and Plan:  Hypertension:  -Continue medication,  -monitor blood pressure at home.  -Continue DASH diet.   -Reminder to go to the ER if any CP, SOB, nausea, dizziness, severe HA, changes vision/speech, left arm numbness and tingling, and jaw pain.  Cholesterol: -Continue diet and exercise.  -Check cholesterol.   Pre-diabetes: -Continue diet and exercise.  -Check A1C  Vitamin D Def: -check level -continue medications.   Continue diet and meds as discussed. Further disposition pending results of labs.  HPI 59 y.o. male  presents for 3 month follow up with hypertension, hyperlipidemia, prediabetes and vitamin D.   His blood pressure has been controlled at home, today their BP is BP: 116/72 mmHg.   He does workout. He denies chest pain, shortness of breath, dizziness.  He has a very physical job.     He is not on cholesterol medication and denies myalgias. His cholesterol is at goal. The cholesterol last visit was:   Lab Results  Component Value Date   CHOL 148 06/29/2015   HDL 29* 06/29/2015   LDLCALC 86 06/29/2015   TRIG 164* 06/29/2015   CHOLHDL 5.1 06/29/2015     He has been working on diet and exercise for prediabetes, and denies foot ulcerations, hyperglycemia, hypoglycemia , increased appetite, nausea, paresthesia of the feet, polydipsia, polyuria, visual disturbances, vomiting and weight loss. Last A1C in the office was:  Lab Results  Component Value Date   HGBA1C 5.6 06/29/2015    Patient is on Vitamin D supplement.  Lab Results  Component Value Date   VD25OH 64 06/29/2015   Patient reports that he did go to see Dr. Greer Pickerel for his possible hernia evaluation.  Per patients report the patient can wait to have surgery.  He reports that the hernia is not bothering him and he is not interested in having it fixed right now.  He does still see pain management for his stomach.   He reports that he does not take the pain medication all the time but he does take it on an as needed basis.    Patient also reports that he did go to see cards about his bicuspid aortic valve with stenosis.  He has been followed yearly with echos.    They will continue to follow with yearly echos.  No signficant change noted.  Per Dr. Johnsie Cancel disease is mild at this time.  He is also not a candidate for TAVR.  Current Medications:  Current Outpatient Prescriptions on File Prior to Visit  Medication Sig Dispense Refill  . acetaminophen (TYLENOL) 500 MG tablet Take 1,500 mg by mouth every 6 (six) hours as needed for mild pain.    Marland Kitchen aspirin 81 MG tablet Take 81 mg by mouth daily.     . Cholecalciferol (VITAMIN D) 2000 UNITS CAPS Take 1 capsule by mouth daily.     . fenofibrate micronized (LOFIBRA) 134 MG capsule TAKE 1 CAPSULE (134 MG TOTAL) BY MOUTH DAILY BEFORE BREAKFAST. 90 capsule 1  . Flaxseed, Linseed, (FLAXSEED OIL) 1200 MG CAPS Take 1 capsule by mouth daily.    Marland Kitchen HYDROcodone-acetaminophen (NORCO/VICODIN) 5-325 MG per tablet Take 1 tablet by mouth 3 (three) times daily as needed. For pain  0  . Omega-3 Fatty Acids (FISH OIL PO) Take 1 tablet by mouth daily.    . traZODone (DESYREL) 150 MG tablet Take 150 mg by mouth at  bedtime.      No current facility-administered medications on file prior to visit.    Medical History:  Past Medical History  Diagnosis Date  . Diverticulitis   . Skin cancer   . Hypercholesteremia   . Abnormal CT scan, chest     Blebs on CT chest but NORMAL PFTs- no COPD  . ED (erectile dysfunction)   . DJD (degenerative joint disease)   . 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 ? Bicuspid valve with dilated aortic root 4.0 cm.  . Elevated blood pressure reading without diagnosis of hypertension   . Aortic stenosis     Allergies:  Allergies  Allergen Reactions  . Morphine  Itching    Can take hydrocodone without issues     Review of Systems:  Review of Systems  Constitutional: Negative for fever, chills and malaise/fatigue.  HENT: Negative for congestion, ear pain and sore throat.   Eyes: Negative.   Respiratory: Negative for cough, shortness of breath and wheezing.   Cardiovascular: Negative for chest pain, palpitations and leg swelling.  Gastrointestinal: Negative for heartburn, abdominal pain, diarrhea, constipation, blood in stool and melena.  Genitourinary: Negative.   Skin: Negative.   Neurological: Negative for dizziness, sensory change, loss of consciousness and headaches.  Psychiatric/Behavioral: Negative for depression. The patient is not nervous/anxious and does not have insomnia.     Family history- Review and unchanged  Social history- Review and unchanged  Physical Exam: BP 116/72 mmHg  Pulse 76  Temp(Src) 97.5 F (36.4 C)  Resp 16  Ht 6' 2.75" (1.899 m)  Wt 220 lb 9.6 oz (100.064 kg)  BMI 27.75 kg/m2 Wt Readings from Last 3 Encounters:  10/05/15 220 lb 9.6 oz (100.064 kg)  09/07/15 216 lb (97.977 kg)  06/29/15 215 lb (97.523 kg)    General Appearance: Well nourished well developed, in no apparent distress. Eyes: PERRLA, EOMs, conjunctiva no swelling or erythema ENT/Mouth: Ear canals normal without obstruction, swelling, erythma, discharge.  TMs normal bilaterally.  Oropharynx moist, clear, without exudate, or postoropharyngeal swelling. Neck: Supple, thyroid normal,no cervical adenopathy  Respiratory: Respiratory effort normal, Breath sounds clear A&P without rhonchi, wheeze, or rale.  No retractions, no accessory usage. Cardio: RRR with no RGs. 2/6 whooshing murmur heard best of the 2nd right ICS.  Brisk peripheral pulses without edema.  Abdomen: Soft, + BS,  Generalized mild tenderness, no guarding, rebound, hernias, masses. Musculoskeletal: Full ROM, 5/5 strength, Normal gait Skin: Warm, dry without rashes, lesions,  ecchymosis.  Neuro: Awake and oriented X 3, Cranial nerves intact. Normal muscle tone, no cerebellar symptoms. Psych: Normal affect, Insight and Judgment appropriate.    [name], PA-C 9:26 AM [facility]

## 2015-10-07 ENCOUNTER — Other Ambulatory Visit: Payer: Self-pay | Admitting: Internal Medicine

## 2015-10-23 ENCOUNTER — Other Ambulatory Visit: Payer: Self-pay | Admitting: Internal Medicine

## 2016-01-21 ENCOUNTER — Encounter: Payer: Self-pay | Admitting: Physician Assistant

## 2016-02-16 ENCOUNTER — Encounter: Payer: Self-pay | Admitting: Internal Medicine

## 2016-02-16 ENCOUNTER — Ambulatory Visit (INDEPENDENT_AMBULATORY_CARE_PROVIDER_SITE_OTHER): Payer: BLUE CROSS/BLUE SHIELD | Admitting: Internal Medicine

## 2016-02-16 VITALS — BP 142/80 | HR 82 | Temp 98.0°F | Resp 18 | Ht 74.75 in | Wt 223.0 lb

## 2016-02-16 DIAGNOSIS — R350 Frequency of micturition: Secondary | ICD-10-CM

## 2016-02-16 LAB — URINALYSIS, ROUTINE W REFLEX MICROSCOPIC
BILIRUBIN URINE: NEGATIVE
GLUCOSE, UA: NEGATIVE
Hgb urine dipstick: NEGATIVE
Ketones, ur: NEGATIVE
Leukocytes, UA: NEGATIVE
Nitrite: NEGATIVE
Protein, ur: NEGATIVE
SPECIFIC GRAVITY, URINE: 1.022 (ref 1.001–1.035)
pH: 5.5 (ref 5.0–8.0)

## 2016-02-16 MED ORDER — CIPROFLOXACIN HCL 500 MG PO TABS: 500.0000 mg | ORAL_TABLET | Freq: Two times a day (BID) | ORAL | Status: AC

## 2016-02-16 NOTE — Patient Instructions (Signed)

## 2016-02-16 NOTE — Progress Notes (Signed)
Subjective:    Patient ID: Anthony Paul, male    DOB: 30-Sep-1956, 60 y.o.   MRN: [id number]  Abdominal Pain Associated symptoms include frequency. Pertinent negatives include no constipation, diarrhea, dysuria, fever, hematuria, nausea or vomiting.  Patient presents to the office for evaluation of abdominal pain in the suprapubic area which has been getting worse for the past couple weeks.  He reports that he chronically has abdominal pain which he has seen pain management for the in past.  He is no longer seeing pain management.  He reports that they weren't doing anything for him other than giving him medications.  He has been off the medication for 3 weeks.  He reports that he has 3 more tablets.  He has a known right sided hernia.  He has noticed that he has had to urinate more frequently.  He reports that no dysuria, no hematuria.  He has noticed some double voiding and has also noticed some issues with urgency.   He has not noticed any change in the hernia.  He does not feel like the pain is not similar to when his hernia is bothering, and it is all the way across it.      Review of Systems  Constitutional: Negative for fever, chills and fatigue.  Respiratory: Negative for cough, chest tightness and shortness of breath.   Gastrointestinal: Positive for abdominal pain. Negative for nausea, vomiting, diarrhea and constipation.  Genitourinary: Positive for frequency. Negative for dysuria, urgency, hematuria, flank pain, decreased urine volume, discharge, scrotal swelling, difficulty urinating and penile pain.  Neurological: Negative for dizziness, speech difficulty, weakness and light-headedness.       Objective:   Physical Exam  Constitutional: He is oriented to person, place, and time. He appears well-developed and well-nourished. No distress.  HENT:  Head: Normocephalic.  Mouth/Throat: Oropharynx is clear and moist. No oropharyngeal exudate.  Eyes: Conjunctivae are normal. No  scleral icterus.  Neck: Normal range of motion. Neck supple. No JVD present. No thyromegaly present.  Cardiovascular: Normal rate, regular rhythm and intact distal pulses.  Exam reveals no gallop and no friction rub.   Murmur heard. Pulmonary/Chest: Effort normal and breath sounds normal. No respiratory distress. He has no wheezes. He has no rales. He exhibits no tenderness.  Abdominal: Soft. Bowel sounds are normal. He exhibits no distension and no mass. There is tenderness. There is no rebound and no guarding.  Midline well healed surgical scar.  Musculoskeletal: Normal range of motion.  Lymphadenopathy:    He has no cervical adenopathy.  Neurological: He is alert and oriented to person, place, and time.  Skin: Skin is warm and dry. He is not diaphoretic.  Psychiatric: He has a normal mood and affect. His behavior is normal. Judgment and thought content normal.  Nursing note and vitals reviewed.   Filed Vitals:   02/16/16 0919  BP: 142/80  Pulse: 82  Temp: 98 F (36.7 C)  Resp: 18          Assessment & Plan:    1. Urinary frequency  - Urinalysis, Routine w reflex microscopic (not at [facility]) - Urine culture - ciprofloxacin (CIPRO) 500 MG tablet; Take 1 tablet (500 mg total) by mouth 2 (two) times daily.  Dispense: 42 tablet; Refill: 0  Patient has left the pain management clinic which may be reason for why he is having increased pain. Exam non-focal.  He did ask for pain medication and I expressly told him that he would not get  any chronic pain medication from my clinic.  He will likely need urine culture recheck in 1 month.

## 2016-02-18 LAB — URINE CULTURE
COLONY COUNT: NO GROWTH
Organism ID, Bacteria: NO GROWTH

## 2016-02-22 ENCOUNTER — Encounter: Payer: Self-pay | Admitting: Internal Medicine

## 2016-02-22 ENCOUNTER — Ambulatory Visit (INDEPENDENT_AMBULATORY_CARE_PROVIDER_SITE_OTHER): Payer: BLUE CROSS/BLUE SHIELD | Admitting: Internal Medicine

## 2016-02-22 VITALS — BP 112/64 | HR 76 | Temp 97.3°F | Resp 16 | Ht 74.75 in | Wt 225.4 lb

## 2016-02-22 DIAGNOSIS — R1084 Generalized abdominal pain: Secondary | ICD-10-CM

## 2016-02-22 DIAGNOSIS — Z79899 Other long term (current) drug therapy: Secondary | ICD-10-CM | POA: Diagnosis not present

## 2016-02-22 LAB — CBC WITH DIFFERENTIAL/PLATELET
BASOS PCT: 0 % (ref 0–1)
Basophils Absolute: 0 10*3/uL (ref 0.0–0.1)
Eosinophils Absolute: 0.1 10*3/uL (ref 0.0–0.7)
Eosinophils Relative: 2 % (ref 0–5)
HCT: 45.5 % (ref 39.0–52.0)
Hemoglobin: 15.2 g/dL (ref 13.0–17.0)
LYMPHS ABS: 1.3 10*3/uL (ref 0.7–4.0)
Lymphocytes Relative: 21 % (ref 12–46)
MCH: 30.2 pg (ref 26.0–34.0)
MCHC: 33.4 g/dL (ref 30.0–36.0)
MCV: 90.3 fL (ref 78.0–100.0)
MONO ABS: 0.7 10*3/uL (ref 0.1–1.0)
MONOS PCT: 11 % (ref 3–12)
MPV: 11.3 fL (ref 8.6–12.4)
NEUTROS ABS: 4 10*3/uL (ref 1.7–7.7)
Neutrophils Relative %: 66 % (ref 43–77)
Platelets: 205 10*3/uL (ref 150–400)
RBC: 5.04 MIL/uL (ref 4.22–5.81)
RDW: 13.7 % (ref 11.5–15.5)
WBC: 6 10*3/uL (ref 4.0–10.5)

## 2016-02-22 LAB — BASIC METABOLIC PANEL WITH GFR
BUN: 15 mg/dL (ref 7–25)
CALCIUM: 9.5 mg/dL (ref 8.6–10.3)
CO2: 26 mmol/L (ref 20–31)
Chloride: 103 mmol/L (ref 98–110)
Creat: 1.19 mg/dL (ref 0.70–1.25)
GFR, EST AFRICAN AMERICAN: 76 mL/min (ref >=60)
GFR, EST NON AFRICAN AMERICAN: 66 mL/min (ref >=60)
GLUCOSE: 88 mg/dL (ref 65–99)
POTASSIUM: 4.9 mmol/L (ref 3.5–5.3)
SODIUM: 141 mmol/L (ref 135–146)

## 2016-02-22 LAB — MAGNESIUM: Magnesium: 2 mg/dL (ref 1.5–2.5)

## 2016-02-22 LAB — HEPATIC FUNCTION PANEL
ALK PHOS: 60 U/L (ref 40–115)
ALT: 17 U/L (ref 9–46)
AST: 15 U/L (ref 10–35)
Albumin: 4.2 g/dL (ref 3.6–5.1)
BILIRUBIN DIRECT: 0.1 mg/dL (ref <=0.2)
BILIRUBIN INDIRECT: 0.3 mg/dL (ref 0.2–1.2)
BILIRUBIN TOTAL: 0.4 mg/dL (ref 0.2–1.2)
TOTAL PROTEIN: 6.4 g/dL (ref 6.1–8.1)

## 2016-02-22 LAB — AMYLASE: AMYLASE: 54 U/L (ref 0–105)

## 2016-02-22 MED ORDER — GABAPENTIN 100 MG PO CAPS
ORAL_CAPSULE | ORAL | Status: DC
Start: 2016-02-22 — End: 2016-03-14

## 2016-02-22 MED ORDER — DULOXETINE HCL 60 MG PO CPEP: ORAL_CAPSULE | ORAL | Status: DC

## 2016-02-22 NOTE — Addendum Note (Signed)
Addended by: Unk Pinto on: 02/22/2016 08:15 PM   Modules accepted: Orders

## 2016-02-22 NOTE — Progress Notes (Signed)
  Subjective:    Patient ID: Anthony Paul, male    DOB: 29-Aug-1956, 60 y.o.   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 Apparently patient has been using a heating pad over the abdominal area. Patient denies  N/V or other GI sx's. He has had thorough negative GI w/u's in the past.    Medication Sig  . acetaminophen  500 MG tablet Take 1,500 mg by mouth every 6 (six) hours as needed for mild pain.  Marland Kitchen aspirin 81 MG tablet Take 81 mg by mouth daily.   Marland Kitchen VITAMIN D 2000 UNITS CAPS Take 1 capsule by mouth daily.   . fenofibrate  134 MG capsule TAKE 1 CAPSULE (134 MG TOTAL) BY MOUTH DAILY BEFORE BREAKFAST.  Marland Kitchen FFLAXSEED OIL Take 1 capsule by mouth daily.  . Omega-3 FISH OIL Take 1 tablet by mouth daily.  . traZODone (DESYREL) 150 MG  TAKE 1/2-1 TABLET BY MOUTH AT BEDTIME AS NEEDED   Allergies  Allergen Reactions  . Morphine Itching    Can take hydrocodone without issues   Past Medical History  Diagnosis Date  . Diverticulitis   . Skin cancer   . Hypercholesteremia   . Abnormal CT scan, chest     Blebs on CT chest but NORMAL PFTs- no COPD  . ED (erectile dysfunction)   . DJD (degenerative joint disease)   . 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 ? Bicuspid valve with dilated aortic root 4.0 cm.  . Elevated blood pressure reading without diagnosis of hypertension   . Aortic stenosis    Review of Systems    10 point systems review negative except as above.    Objective:   Physical Exam  BP 112/64 mmHg  Pulse 76  Temp(Src) 97.3 F (36.3 C)  Resp 16  Ht 6' 2.75" (1.899 m)  Wt 225 lb 6.4 oz (102.241 kg)  BMI 28.35 kg/m2  HEENT - Eac's patent. TM's Nl. EOM's full. PERRLA. NasoOroPharynx  clear. Neck - supple. Nl Thyroid. Carotids 2+ & No bruits, nodes, JVD Chest - Clear equal BS w/o Rales, rhonchi, wheezes. Cor - Nl HS. RRR w/o sig MGR. PP 1(+). No edema. Abd -  Skin of abdomen appears to has slightly reddish-brown hyperpigmented appearance. Flat No palpable organomegaly or masses .BS nl.There is tenderness along the costal margins bilaterally. MS- FROM w/o deformities. Muscle power, tone and bulk Nl. Gait Nl. Neuro - No obvious Cr N abnormalities. Sensory, motor and Cerebellar functions appear Nl w/o focal abnormalities. Psyche - Mental status normal & appropriate.  No delusions, ideations or obvious mood abnormalities.    Assessment & Plan:   1. Generalized abdominal pain - felt musculoskeletal  - BASIC METABOLIC PANEL WITH GFR - Hepatic function panel - Magnesium - Amylase - CBC with Differential/Platelet - Celiac panel  - DULoxetine (CYMBALTA) 60 MG capsule; Take 1 capsule at bedtime for pain  Dispense: 30 capsule; Refill: 5 - gabapentin (NEURONTIN) 100 MG capsule; Take 1 capsule 3 x daily with meals for pain  Dispense: 24 capsule; Refill: 0 - has f/u OV in 2-3 weeks

## 2016-02-23 LAB — GLIA (IGA/G) + TTG IGA
GLIADIN IGA: 3 U (ref <20)
GLIADIN IGG: 2 U (ref <20)
TISSUE TRANSGLUTAMINASE AB, IGA: 1 U/mL (ref <4)

## 2016-02-25 ENCOUNTER — Encounter: Payer: Self-pay | Admitting: Internal Medicine

## 2016-03-13 ENCOUNTER — Encounter: Payer: Self-pay | Admitting: Internal Medicine

## 2016-03-13 NOTE — Patient Instructions (Signed)

## 2016-03-13 NOTE — Progress Notes (Addendum)
Patient ID: Anthony Paul, male   DOB: 1956-11-25, 60 y.o.   MRN: [id number]  Annual  Screening/Preventative Visit And Comprehensive Evaluation & Examination     This very nice 60 y.o. MWM presents for a Wellness/Preventative Visit & comprehensive evaluation and management of multiple medical co-morbidities.  Patient has been followed for labile HTN, Prediabetes, Hyperlipidemia and Vitamin D Deficiency.      Patient has a hx/o Chronic abdominal pain circa 2009 and has had an extensive negative w/u by Dr Earlean Shawl and had been referred to [facility]. More recently he c/o Bilateral lower abdominal pains along the Inguinal lines  (R>L)with pains exacerbated with positional changes as bending, squatting, etc. He had denied any associated upper or lower GI sx's as N/V/HtB, cramping/D or C. Recently he was empirically begun on Duloxetine & Gabapentin.      Patient has hx/o elevated BP's in the past & is followed expectantly.  Patient's BP has been controlled at home.Today's BP: 122/70 mmHg. Patient has a Bicuspid AoV w/predominant AoI and is followed annually by [name].  Patient denies any cardiac symptoms as chest pain, palpitations, shortness of breath, dizziness or ankle swelling.     Patient's hyperlipidemia is not controlled with diet and medications. Patient denies myalgias or other medication SE's. Last lipids were not at goal with Cholesterol 183; HDL 37*; LDL 116; Triglycerides 148 on 10/05/2015.      Patient has prediabetes since Jan 2013 with A1c 5.7% and patient denies reactive hypoglycemic symptoms, visual blurring, diabetic polys or paresthesias. Last A1c was still 5.7% on 10/05/2015.     Finally, patient has history of Vitamin D Deficiency of "2" in Jan 2013  and last vitamin D was 64 on 06/29/2015.  Medication Sig  . acetaminophen  500 MG tablet Take 1,500 mg by mouth every 6 (six) hours as needed for mild pain.  Marland Kitchen aspirin 81 MG Take 81 mg by mouth daily.   Marland Kitchen VITAMIN D 2000  UNITS  Take 1 capsule by mouth daily.   . DULoxetine  60 MG  Take 1 capsule at bedtime for pain  . fenofibrate 134 MG  TAKE 1 CAPSULE (134 MG TOTAL) BY MOUTH DAILY BEFORE BREAKFAST.  Marland Kitchen FLAXSEED OIL 1200 MG  Take 1 capsule by mouth daily.  Marland Kitchen gabapentin  100 MG  Take 1 capsule 3 x daily with meals for pain  . VICODIN 5-325 MG Take 1 tablet by mouth every 6 (six) hours as needed for moderate pain.  . Omega-3 FISH OIL  Take 1 tablet by mouth daily.  . traZODone 150 MG tablet TAKE 1/2-1 TABLET BY MOUTH AT BEDTIME AS NEEDED   Allergies  Allergen Reactions  . Morphine Itching    Can take hydrocodone without issues   Past Medical History  Diagnosis Date  . Diverticulitis   . Skin cancer   . Hypercholesteremia   . Abnormal CT scan, chest     Blebs on CT chest but NORMAL PFTs- no COPD  . ED (erectile dysfunction)   . DJD (degenerative joint disease)   . 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 ? Bicuspid valve with dilated aortic root 4.0 cm.  . Elevated blood pressure reading without diagnosis of hypertension   . Aortic stenosis    Health Maintenance  Topic Date Due  . Hepatitis C Screening  1956/05/26  . HIV  Screening  01/25/1970  . ZOSTAVAX  01/25/2015  . COLONOSCOPY  12/20/2015  . INFLUENZA VACCINE  07/19/2016  . TETANUS/TDAP  03/08/2025   Immunization History  Administered Date(s) Administered  . Influenza Split 10/05/2015  . Influenza,inj,Quad PF,36+ Mos 11/15/2013  . Influenza-Unspecified 12/19/2009  . PPD Test 03/09/2015, 03/14/2016  . Pneumococcal Conjugate-13 11/15/2013  . Pneumococcal-Unspecified 12/20/1995  . Td 12/19/2004  . Tdap 03/09/2015   Past Surgical History  Procedure Laterality Date  . Colon surgery  Jan 2012  . Tonsillectomy    . Hemorroidectomy    . Shoulder surgery    . Colonoscopy  12/2013    Dr. Earlean Shawl, due every 5-7 years  . Upper gi endoscopy  2015    Dr. Earlean Shawl    Family History  Problem Relation Age of Onset  . Heart disease Father   . Benign prostatic hyperplasia Father    Social History   Social History  . Marital Status: Married    Spouse Name: N/A  . Number of Children: 3  . Years of Education: N/A   Occupational History  . DRIVER [employer]   Social History Main Topics  . Smoking status: Former Smoker -- 0.50 packs/day    Types: Cigarettes    Quit date: 07/19/2013  . Smokeless tobacco: Former Systems developer    Types: Chew    Quit date: 12/19/2006     Comment: Started smoking at age 23  . Alcohol Use: 3.0 oz/week    5 Standard drinks or equivalent per week     Comment: 1-2 drinks per day  . Drug Use: No  . Sexual Activity: Not on file    ROS Constitutional: Denies fever, chills, weight loss/gain, headaches, insomnia,  night sweats or change in appetite. Does c/o fatigue. Eyes: Denies redness, blurred vision, diplopia, discharge, itchy or watery eyes.  ENT: Denies discharge, congestion, post nasal drip, epistaxis, sore throat, earache, hearing loss, dental pain, Tinnitus, Vertigo, Sinus pain or snoring.  Cardio: Denies chest pain, palpitations, irregular heartbeat, syncope, dyspnea, diaphoresis, orthopnea, PND, claudication or edema Respiratory: denies cough, dyspnea, DOE, pleurisy, hoarseness, laryngitis or wheezing.  Gastrointestinal: Denies dysphagia, heartburn, reflux, water brash, pain, cramps, nausea, vomiting, bloating, diarrhea, constipation, hematemesis, melena, hematochezia, jaundice or hemorrhoids Genitourinary: Denies dysuria, frequency, urgency, nocturia, hesitancy, discharge, hematuria or flank pain Musculoskeletal: Denies arthralgia, myalgia, stiffness, Jt. Swelling, pain, limp or strain/sprain. Denies Falls. Skin: Denies puritis, rash, hives, warts, acne, eczema or change in skin lesion Neuro: No weakness, tremor, incoordination, spasms, paresthesia or pain Psychiatric: Denies confusion, memory loss or sensory loss. Denies  Depression. Endocrine: Denies change in weight, skin, hair change, nocturia, and paresthesia, diabetic polys, visual blurring or hyper / hypo glycemic episodes.  Heme/Lymph: No excessive bleeding, bruising or enlarged lymph nodes.  Physical Exam  BP 122/70 mmHg  Pulse 72  Temp(Src) 97.5 F (36.4 C)  Resp 16  Ht 6' 2.75" (1.899 m)  Wt 221 lb 6.4 oz (100.426 kg)  BMI 27.85 kg/m2  General Appearance: Well nourished, in no apparent distress. Eyes: PERRLA, EOMs, conjunctiva no swelling or erythema, normal fundi and vessels. Sinuses: No frontal/maxillary tenderness ENT/Mouth: EACs patent / TMs  nl. Nares clear without erythema, swelling, mucoid exudates. Oral hygiene is good. No erythema, swelling, or exudate. Tongue normal, non-obstructing. Tonsils not swollen or erythematous. Hearing normal.  Neck: Supple, thyroid normal. No bruits, nodes or JVD. Respiratory: Respiratory effort normal.  BS equal and clear bilateral without rales, rhonci, wheezing or stridor. Cardio: Heart sounds are normal with regular  rate and rhythm and there is a Gr 2-3 AoS sys M along the RSB. Peripheral pulses are normal and equal bilaterally without edema. No aortic or femoral bruits. Chest: symmetric with normal excursions and percussion.  Abdomen: Soft, with Nl bowel sounds. Sl tender superficially w/o deep tenderness elicited, no guarding, rebound, hernias, masses, or organomegaly.  Lymphatics: Non tender without lymphadenopathy.  Genitourinary: No hernias.Testes nl. DRE - prostate nl for age - smooth & firm w/o nodules. Musculoskeletal: Full ROM all peripheral extremities, joint stability, 5/5 strength, and normal gait. Skin: Warm and dry without rashes, lesions, cyanosis, clubbing or  ecchymosis.  Neuro: Cranial nerves intact, reflexes equal bilaterally. Normal muscle tone, no cerebellar symptoms. Sensation intact.  Pysch: Alert and oriented X 3 with normal affect, insight and judgment appropriate.   Assessment  and Plan  1. Annual Preventative/Screening Exam   - Microalbumin / creatinine urine ratio - EKG 12-Lead - [place], RETROPERITNL ABD,  LTD - POC Hemoccult Bld/Stl  - Urinalysis, Routine w reflex microscopic  - Vitamin B12 - Iron and TIBC - PSA - Testosterone - CBC with Differential/Platelet - BASIC METABOLIC PANEL WITH GFR - Hepatic function panel - Magnesium - Lipid panel - TSH - Hemoglobin A1c - Insulin, random - VITAMIN D 25 Hydroxy  2. Borderline systolic HTN  - Microalbumin / creatinine urine ratio - EKG 12-Lead - [place], RETROPERITNL ABD,  LTD - TSH  3. Mixed hyperlipidemia  - Lipid panel - TSH  4. Prediabetes  - Hemoglobin A1c - Insulin, random  5. Vitamin D deficiency  - VITAMIN D 25 Hydroxy   6. Chronic obstructive pulmonary disease ([place])   7. Bicuspid aortic valve   8. Screening for rectal cancer  - POC Hemoccult Bld/Stl   9. Prostate cancer screening  - PSA  10. Other fatigue  - Vitamin B12 - Iron and TIBC - Testosterone - CBC with Differential/Platelet - TSH  11. Medication management  - Urinalysis, Routine w reflex microscopic  - CBC with Differential/Platelet - BASIC METABOLIC PANEL WITH GFR - Hepatic function panel - Magnesium   Continue prudent diet as discussed, weight control, BP monitoring, regular exercise, and medications as discussed.  Discussed med effects and SE's. Routine screening labs and tests as requested with regular follow-up as recommended. Over 40 minutes of exam, counseling, chart review and high complex critical decision making was performed

## 2016-03-14 ENCOUNTER — Ambulatory Visit (INDEPENDENT_AMBULATORY_CARE_PROVIDER_SITE_OTHER): Payer: BLUE CROSS/BLUE SHIELD | Admitting: Internal Medicine

## 2016-03-14 ENCOUNTER — Encounter: Payer: Self-pay | Admitting: Internal Medicine

## 2016-03-14 VITALS — BP 122/70 | HR 72 | Temp 97.5°F | Resp 16 | Ht 74.75 in | Wt 221.4 lb

## 2016-03-14 DIAGNOSIS — Z0001 Encounter for general adult medical examination with abnormal findings: Secondary | ICD-10-CM

## 2016-03-14 DIAGNOSIS — R1084 Generalized abdominal pain: Secondary | ICD-10-CM

## 2016-03-14 DIAGNOSIS — Z125 Encounter for screening for malignant neoplasm of prostate: Secondary | ICD-10-CM

## 2016-03-14 DIAGNOSIS — Z0000 Encounter for general adult medical examination without abnormal findings: Secondary | ICD-10-CM

## 2016-03-14 DIAGNOSIS — Q231 Congenital insufficiency of aortic valve: Secondary | ICD-10-CM

## 2016-03-14 DIAGNOSIS — Z1212 Encounter for screening for malignant neoplasm of rectum: Secondary | ICD-10-CM

## 2016-03-14 DIAGNOSIS — J449 Chronic obstructive pulmonary disease, unspecified: Secondary | ICD-10-CM

## 2016-03-14 DIAGNOSIS — Z79899 Other long term (current) drug therapy: Secondary | ICD-10-CM

## 2016-03-14 DIAGNOSIS — R5383 Other fatigue: Secondary | ICD-10-CM

## 2016-03-14 DIAGNOSIS — E782 Mixed hyperlipidemia: Secondary | ICD-10-CM

## 2016-03-14 DIAGNOSIS — E559 Vitamin D deficiency, unspecified: Secondary | ICD-10-CM | POA: Diagnosis not present

## 2016-03-14 DIAGNOSIS — R7303 Prediabetes: Secondary | ICD-10-CM

## 2016-03-14 DIAGNOSIS — R030 Elevated blood-pressure reading, without diagnosis of hypertension: Secondary | ICD-10-CM

## 2016-03-14 DIAGNOSIS — Z111 Encounter for screening for respiratory tuberculosis: Secondary | ICD-10-CM | POA: Diagnosis not present

## 2016-03-14 LAB — CBC WITH DIFFERENTIAL/PLATELET
BASOS ABS: 0 10*3/uL (ref 0.0–0.1)
BASOS PCT: 0 % (ref 0–1)
Eosinophils Absolute: 0.2 10*3/uL (ref 0.0–0.7)
Eosinophils Relative: 3 % (ref 0–5)
HCT: 43.6 % (ref 39.0–52.0)
Hemoglobin: 14.8 g/dL (ref 13.0–17.0)
LYMPHS PCT: 26 % (ref 12–46)
Lymphs Abs: 1.4 10*3/uL (ref 0.7–4.0)
MCH: 30.6 pg (ref 26.0–34.0)
MCHC: 33.9 g/dL (ref 30.0–36.0)
MCV: 90.1 fL (ref 78.0–100.0)
MONO ABS: 0.6 10*3/uL (ref 0.1–1.0)
MONOS PCT: 11 % (ref 3–12)
MPV: 10.7 fL (ref 8.6–12.4)
NEUTROS ABS: 3.3 10*3/uL (ref 1.7–7.7)
Neutrophils Relative %: 60 % (ref 43–77)
PLATELETS: 167 10*3/uL (ref 150–400)
RBC: 4.84 MIL/uL (ref 4.22–5.81)
RDW: 13.7 % (ref 11.5–15.5)
WBC: 5.5 10*3/uL (ref 4.0–10.5)

## 2016-03-14 LAB — MAGNESIUM: Magnesium: 1.8 mg/dL (ref 1.5–2.5)

## 2016-03-14 LAB — VITAMIN B12: Vitamin B-12: 207 pg/mL (ref 200–1100)

## 2016-03-14 LAB — BASIC METABOLIC PANEL WITH GFR
BUN: 16 mg/dL (ref 7–25)
CHLORIDE: 107 mmol/L (ref 98–110)
CO2: 22 mmol/L (ref 20–31)
Calcium: 8.9 mg/dL (ref 8.6–10.3)
Creat: 1.17 mg/dL (ref 0.70–1.25)
GFR, EST AFRICAN AMERICAN: 77 mL/min (ref >=60)
GFR, EST NON AFRICAN AMERICAN: 67 mL/min (ref >=60)
Glucose, Bld: 120 mg/dL — ABNORMAL HIGH (ref 65–99)
POTASSIUM: 4.3 mmol/L (ref 3.5–5.3)
SODIUM: 140 mmol/L (ref 135–146)

## 2016-03-14 LAB — LIPID PANEL
CHOL/HDL RATIO: 5 ratio (ref <=5.0)
Cholesterol: 160 mg/dL (ref 125–200)
HDL: 32 mg/dL — AB (ref >=40)
LDL CALC: 98 mg/dL (ref <130)
TRIGLYCERIDES: 148 mg/dL (ref <150)
VLDL: 30 mg/dL (ref <30)

## 2016-03-14 LAB — IRON AND TIBC
%SAT: 37 % (ref 15–60)
Iron: 106 ug/dL (ref 50–180)
TIBC: 289 ug/dL (ref 250–425)
UIBC: 183 ug/dL (ref 125–400)

## 2016-03-14 LAB — HEMOGLOBIN A1C
Hgb A1c MFr Bld: 5.9 % — ABNORMAL HIGH (ref <5.7)
Mean Plasma Glucose: 123 mg/dL

## 2016-03-14 LAB — HEPATIC FUNCTION PANEL
ALK PHOS: 58 U/L (ref 40–115)
ALT: 25 U/L (ref 9–46)
AST: 21 U/L (ref 10–35)
Albumin: 4.2 g/dL (ref 3.6–5.1)
BILIRUBIN DIRECT: 0.1 mg/dL (ref <=0.2)
BILIRUBIN TOTAL: 0.4 mg/dL (ref 0.2–1.2)
Indirect Bilirubin: 0.3 mg/dL (ref 0.2–1.2)
Total Protein: 6.2 g/dL (ref 6.1–8.1)

## 2016-03-14 LAB — TSH: TSH: 2.58 m[IU]/L (ref 0.40–4.50)

## 2016-03-14 MED ORDER — GABAPENTIN 100 MG PO CAPS: ORAL_CAPSULE | ORAL | Status: DC

## 2016-03-15 LAB — PSA: PSA: 0.85 ng/mL (ref <=4.00)

## 2016-03-15 LAB — URINALYSIS, ROUTINE W REFLEX MICROSCOPIC
Bilirubin Urine: NEGATIVE
Glucose, UA: NEGATIVE
HGB URINE DIPSTICK: NEGATIVE
Ketones, ur: NEGATIVE
LEUKOCYTES UA: NEGATIVE
NITRITE: NEGATIVE
PROTEIN: NEGATIVE
Specific Gravity, Urine: 1.019 (ref 1.001–1.035)
pH: 5 (ref 5.0–8.0)

## 2016-03-15 LAB — MICROALBUMIN / CREATININE URINE RATIO
Creatinine, Urine: 144 mg/dL (ref 20–370)
MICROALB UR: 0.3 mg/dL
MICROALB/CREAT RATIO: 2 ug/mg{creat} (ref <30)

## 2016-03-15 LAB — VITAMIN D 25 HYDROXY (VIT D DEFICIENCY, FRACTURES): VIT D 25 HYDROXY: 49 ng/mL (ref 30–100)

## 2016-03-15 LAB — TESTOSTERONE: TESTOSTERONE: 283 ng/dL (ref 250–827)

## 2016-03-15 LAB — INSULIN, RANDOM: INSULIN: 11.3 u[IU]/mL (ref 2.0–19.6)

## 2016-03-23 ENCOUNTER — Other Ambulatory Visit: Payer: Self-pay | Admitting: *Deleted

## 2016-03-23 DIAGNOSIS — R1084 Generalized abdominal pain: Secondary | ICD-10-CM

## 2016-03-23 MED ORDER — DULOXETINE HCL 60 MG PO CPEP: ORAL_CAPSULE | ORAL | Status: DC

## 2016-04-08 ENCOUNTER — Other Ambulatory Visit: Payer: Self-pay | Admitting: Internal Medicine

## 2016-06-20 ENCOUNTER — Ambulatory Visit: Payer: Self-pay | Admitting: Internal Medicine

## 2016-07-04 ENCOUNTER — Ambulatory Visit (INDEPENDENT_AMBULATORY_CARE_PROVIDER_SITE_OTHER): Payer: BLUE CROSS/BLUE SHIELD | Admitting: Internal Medicine

## 2016-07-04 ENCOUNTER — Encounter: Payer: Self-pay | Admitting: Internal Medicine

## 2016-07-04 VITALS — BP 110/62 | HR 76 | Temp 98.0°F | Resp 16 | Ht 74.75 in | Wt 222.0 lb

## 2016-07-04 DIAGNOSIS — R7303 Prediabetes: Secondary | ICD-10-CM | POA: Diagnosis not present

## 2016-07-04 DIAGNOSIS — H6593 Unspecified nonsuppurative otitis media, bilateral: Secondary | ICD-10-CM

## 2016-07-04 DIAGNOSIS — E785 Hyperlipidemia, unspecified: Secondary | ICD-10-CM

## 2016-07-04 DIAGNOSIS — G894 Chronic pain syndrome: Secondary | ICD-10-CM

## 2016-07-04 DIAGNOSIS — E782 Mixed hyperlipidemia: Secondary | ICD-10-CM | POA: Diagnosis not present

## 2016-07-04 DIAGNOSIS — Z79899 Other long term (current) drug therapy: Secondary | ICD-10-CM

## 2016-07-04 DIAGNOSIS — E559 Vitamin D deficiency, unspecified: Secondary | ICD-10-CM | POA: Diagnosis not present

## 2016-07-04 DIAGNOSIS — R030 Elevated blood-pressure reading, without diagnosis of hypertension: Secondary | ICD-10-CM | POA: Diagnosis not present

## 2016-07-04 LAB — HEPATIC FUNCTION PANEL
ALT: 15 U/L (ref 9–46)
AST: 15 U/L (ref 10–35)
Albumin: 4.2 g/dL (ref 3.6–5.1)
Alkaline Phosphatase: 53 U/L (ref 40–115)
BILIRUBIN DIRECT: 0.1 mg/dL (ref <=0.2)
BILIRUBIN INDIRECT: 0.3 mg/dL (ref 0.2–1.2)
Total Bilirubin: 0.4 mg/dL (ref 0.2–1.2)
Total Protein: 6.3 g/dL (ref 6.1–8.1)

## 2016-07-04 LAB — LIPID PANEL
Cholesterol: 164 mg/dL (ref 125–200)
HDL: 37 mg/dL — AB (ref >=40)
LDL CALC: 106 mg/dL (ref <130)
TRIGLYCERIDES: 104 mg/dL (ref <150)
Total CHOL/HDL Ratio: 4.4 Ratio (ref <=5.0)
VLDL: 21 mg/dL (ref <30)

## 2016-07-04 LAB — CBC WITH DIFFERENTIAL/PLATELET
Basophils Absolute: 0 cells/uL (ref 0–200)
Basophils Relative: 0 %
EOS PCT: 4 %
Eosinophils Absolute: 232 cells/uL (ref 15–500)
HCT: 45.4 % (ref 38.5–50.0)
Hemoglobin: 15.3 g/dL (ref 13.2–17.1)
LYMPHS ABS: 1334 {cells}/uL (ref 850–3900)
LYMPHS PCT: 23 %
MCH: 31.2 pg (ref 27.0–33.0)
MCHC: 33.7 g/dL (ref 32.0–36.0)
MCV: 92.5 fL (ref 80.0–100.0)
MPV: 10.6 fL (ref 7.5–12.5)
Monocytes Absolute: 696 cells/uL (ref 200–950)
Monocytes Relative: 12 %
NEUTROS PCT: 61 %
Neutro Abs: 3538 cells/uL (ref 1500–7800)
PLATELETS: 190 10*3/uL (ref 140–400)
RBC: 4.91 MIL/uL (ref 4.20–5.80)
RDW: 13.1 % (ref 11.0–15.0)
WBC: 5.8 10*3/uL (ref 3.8–10.8)

## 2016-07-04 LAB — BASIC METABOLIC PANEL WITH GFR
BUN: 15 mg/dL (ref 7–25)
CALCIUM: 9.3 mg/dL (ref 8.6–10.3)
CO2: 25 mmol/L (ref 20–31)
Chloride: 105 mmol/L (ref 98–110)
Creat: 1.43 mg/dL — ABNORMAL HIGH (ref 0.70–1.25)
GFR, EST AFRICAN AMERICAN: 61 mL/min (ref >=60)
GFR, EST NON AFRICAN AMERICAN: 52 mL/min — AB (ref >=60)
Glucose, Bld: 114 mg/dL — ABNORMAL HIGH (ref 65–99)
Potassium: 4.3 mmol/L (ref 3.5–5.3)
SODIUM: 141 mmol/L (ref 135–146)

## 2016-07-04 NOTE — Progress Notes (Signed)
Assessment and Plan:  Hypertension:  -Continue medication,  -monitor blood pressure at home.  -Continue DASH diet.   -Reminder to go to the ER if any CP, SOB, nausea, dizziness, severe HA, changes vision/speech, left arm numbness and tingling, and jaw pain.  Cholesterol: -Continue diet and exercise.  -Check cholesterol.   Pre-diabetes: -Continue diet and exercise.  -Check A1C  Vitamin D Def: -continue medications.   Chronic pain syndrome -seeing pain management  Middle ear effusions -zyrtec claritin or allegra daily -sudafed prn if ear pain  Continue diet and meds as discussed. Further disposition pending results of labs.  HPI 60 y.o. male  presents for 3 month follow up with hypertension, hyperlipidemia, prediabetes and vitamin D.   His blood pressure has been controlled at home, today their BP is BP: 110/62 mmHg.   He does workout. He denies chest pain, shortness of breath, dizziness.   He is on cholesterol medication and denies myalgias. His cholesterol is at goal. The cholesterol last visit was:   Lab Results  Component Value Date   CHOL 160 03/14/2016   HDL 32* 03/14/2016   LDLCALC 98 03/14/2016   TRIG 148 03/14/2016   CHOLHDL 5.0 03/14/2016     He has been working on diet and exercise for prediabetes, and denies foot ulcerations, hyperglycemia, hypoglycemia , increased appetite, nausea, paresthesia of the feet, polydipsia, polyuria, visual disturbances, vomiting and weight loss. Last A1C in the office was:  Lab Results  Component Value Date   HGBA1C 5.9* 03/14/2016    Patient is on Vitamin D supplement.  Lab Results  Component Value Date   VD25OH 49 03/14/2016     Patient reports that he is still seeing pain management for his abdominal.   He does report some ear fullness and popping.    Current Medications:  Current Outpatient Prescriptions on File Prior to Visit  Medication Sig Dispense Refill  . acetaminophen (TYLENOL) 500 MG tablet Take 1,500 mg  by mouth every 6 (six) hours as needed for mild pain.    Marland Kitchen aspirin 81 MG tablet Take 81 mg by mouth daily.     . Cholecalciferol (VITAMIN D) 2000 UNITS CAPS Take 1 capsule by mouth daily.     . DULoxetine (CYMBALTA) 60 MG capsule Take 1 capsule at bedtime for pain 90 capsule 1  . fenofibrate micronized (LOFIBRA) 134 MG capsule TAKE 1 CAPSULE (134 MG TOTAL) BY MOUTH DAILY BEFORE BREAKFAST. 90 capsule 1  . Flaxseed, Linseed, (FLAXSEED OIL) 1200 MG CAPS Take 1 capsule by mouth daily.    Marland Kitchen HYDROcodone-acetaminophen (NORCO/VICODIN) 5-325 MG tablet Take 1 tablet by mouth every 6 (six) hours as needed for moderate pain.    . Omega-3 Fatty Acids (FISH OIL PO) Take 1 tablet by mouth daily.    . traZODone (DESYREL) 150 MG tablet TAKE 1/2-1 TABLET BY MOUTH AT BEDTIME AS NEEDED 90 tablet 1  . gabapentin (NEURONTIN) 100 MG capsule Take 1 capsule 3 x daily with meals for pain 270 capsule 6   No current facility-administered medications on file prior to visit.    Medical History:  Past Medical History  Diagnosis Date  . Diverticulitis   . Skin cancer   . Hypercholesteremia   . Abnormal CT scan, chest     Blebs on CT chest but NORMAL PFTs- no COPD  . ED (erectile dysfunction)   . DJD (degenerative joint disease)   . Bicuspid aortic valve     Echo 2014  EF 55-60% Mild to  moderate AR mild AS with mean gradient 18 mmHg and peak of 25 mmHg. ? Bicuspid valve with dilated aortic root 4.0 cm.  . Elevated blood pressure reading without diagnosis of hypertension   . Aortic stenosis     Allergies:  Allergies  Allergen Reactions  . Morphine Itching    Can take hydrocodone without issues     Review of Systems:  Review of Systems  Constitutional: Negative for fever, chills and malaise/fatigue.  HENT: Positive for congestion and ear pain. Negative for sore throat.   Eyes: Negative.   Respiratory: Negative for cough, hemoptysis and shortness of breath.   Cardiovascular: Negative for chest pain,  palpitations and leg swelling.  Gastrointestinal: Negative for heartburn, abdominal pain, diarrhea, constipation, blood in stool and melena.  Genitourinary: Negative.   Skin: Negative.   Neurological: Negative for dizziness, sensory change, loss of consciousness and headaches.  Psychiatric/Behavioral: Negative for depression. The patient is not nervous/anxious and does not have insomnia.     Family history- Review and unchanged  Social history- Review and unchanged  Physical Exam: BP 110/62 mmHg  Pulse 76  Temp(Src) 98 F (36.7 C) (Temporal)  Resp 16  Ht 6' 2.75" (1.899 m)  Wt 222 lb (100.699 kg)  BMI 27.92 kg/m2 Wt Readings from Last 3 Encounters:  07/04/16 222 lb (100.699 kg)  03/14/16 221 lb 6.4 oz (100.426 kg)  02/22/16 225 lb 6.4 oz (102.241 kg)    General Appearance: Well nourished well developed, in no apparent distress. Eyes: PERRLA, EOMs, conjunctiva no swelling or erythema ENT/Mouth: Ear canals normal without obstruction, swelling, erythma, discharge.  TMs normal bilaterally.  Oropharynx moist, clear, without exudate, or postoropharyngeal swelling. Neck: Supple, thyroid normal,no cervical adenopathy  Respiratory: Respiratory effort normal, Breath sounds clear A&P without rhonchi, wheeze, or rale.  No retractions, no accessory usage. Cardio: RRR with no MRGs. Brisk peripheral pulses without edema.  Abdomen: Soft, + BS,  Non tender, no guarding, rebound, hernias, masses. Musculoskeletal: Full ROM, 5/5 strength, Normal gait Skin: Warm, dry without rashes, lesions, ecchymosis.  Neuro: Awake and oriented X 3, Cranial nerves intact. Normal muscle tone, no cerebellar symptoms. Psych: Normal affect, Insight and Judgment appropriate.    [name], PA-C 9:15 AM [facility]

## 2016-08-15 ENCOUNTER — Encounter: Payer: Self-pay | Admitting: Internal Medicine

## 2016-08-15 ENCOUNTER — Ambulatory Visit (INDEPENDENT_AMBULATORY_CARE_PROVIDER_SITE_OTHER): Payer: BLUE CROSS/BLUE SHIELD | Admitting: Internal Medicine

## 2016-08-15 VITALS — BP 118/66 | HR 76 | Temp 98.2°F | Resp 16 | Ht 74.75 in | Wt 220.0 lb

## 2016-08-15 DIAGNOSIS — R1031 Right lower quadrant pain: Secondary | ICD-10-CM | POA: Diagnosis not present

## 2016-08-15 DIAGNOSIS — R197 Diarrhea, unspecified: Secondary | ICD-10-CM

## 2016-08-15 MED ORDER — METRONIDAZOLE 500 MG PO TABS: 500.0000 mg | ORAL_TABLET | Freq: Two times a day (BID) | ORAL | 0 refills | Status: DC

## 2016-08-15 MED ORDER — CIPROFLOXACIN HCL 500 MG PO TABS: 500.0000 mg | ORAL_TABLET | Freq: Two times a day (BID) | ORAL | 0 refills | Status: AC

## 2016-08-15 MED ORDER — HYOSCYAMINE SULFATE 0.125 MG SL SUBL: SUBLINGUAL_TABLET | SUBLINGUAL | 0 refills | Status: DC

## 2016-08-15 NOTE — Progress Notes (Signed)
   Subjective:    Patient ID: Anthony Paul, male    DOB: 10-Jan-1956, 60 y.o.   MRN: [id number]  HPI  Patient presents to the office for evaluation of RLQ abdominal pain and diarrhea.  He reports that he developed diarrhea 5 days ago.   He has been having 3-4 loose stools per day with occasional episodes of watery stools.  No blood in his stool or black colored stools. He has tried immodium and this helped.  He has not had any diarrhea through the night.  He developed some RLQ pain yesterday and it maximized last night.  Today it is not nearly as severe.  He reports that the bowel movements do help relieve the pain.  The pain is an aching feeling.  He has no radiation.  He did not take his hydrocodone yesterday.  He reports that he has not taken any hydrocodone today.  He does get this from pain management.  No travel recently.  No recently abx use.  No recent abnormal foods.  Nobody else has been sick.  Patient does have a history of colectomy secondary to diverticulitis.  This does not feel like diverticulitis.     Review of Systems  Constitutional: Negative for chills, fatigue and fever.  Gastrointestinal: Positive for abdominal pain, diarrhea and nausea. Negative for blood in stool, constipation and vomiting.       Objective:   Physical Exam  Constitutional: He is oriented to person, place, and time. He appears well-developed and well-nourished. No distress.  HENT:  Head: Normocephalic.  Mouth/Throat: Oropharynx is clear and moist. No oropharyngeal exudate.  Eyes: Conjunctivae are normal. No scleral icterus.  Neck: Normal range of motion. Neck supple. No JVD present. No thyromegaly present.  Cardiovascular: Normal rate, regular rhythm and intact distal pulses.  Exam reveals no gallop.   Murmur heard. Pulmonary/Chest: Effort normal and breath sounds normal. No respiratory distress. He has no wheezes. He has no rales. He exhibits no tenderness.  Abdominal: Soft. Bowel sounds are normal. He  exhibits no distension and no mass. There is tenderness in the right lower quadrant, suprapubic area and left lower quadrant. There is no rigidity, no rebound, no guarding, no CVA tenderness, no tenderness at McBurney's point and negative Murphy's sign.  Well healed midline surgical scar  Musculoskeletal: Normal range of motion.  Lymphadenopathy:    He has no cervical adenopathy.  Neurological: He is alert and oriented to person, place, and time.  Skin: Skin is warm and dry. He is not diaphoretic.  Psychiatric: He has a normal mood and affect. His behavior is normal. Judgment and thought content normal.  Nursing note and vitals reviewed.   Vitals:   08/15/16 1007  BP: 118/66  Pulse: 76  Resp: 16  Temp: 98.2 F (36.8 C)          Assessment & Plan:    1. Right lower quadrant abdominal pain -negative tenderness at McBurneys -no red flag symptoms for peritonitis -viral gastroenteritis vs. Less likely  Diverticulitis -cipro and flagyl BID x 7 days  2. Diarrhea, unspecified type -levsin

## 2016-09-01 ENCOUNTER — Other Ambulatory Visit: Payer: Self-pay | Admitting: Physician Assistant

## 2016-09-06 ENCOUNTER — Encounter: Payer: Self-pay | Admitting: Cardiovascular Disease

## 2016-09-12 NOTE — Progress Notes (Signed)
Patient ID: Anthony Paul, male   DOB: January 31, 1956, 60 y.o.   MRN: [id number]   60 y.o. referred by Dr Melford Aase for murmur in 2014  . 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 He indicates seeing Dr Vidal Schwalbe and having echo but these records not available. Had CT scan for some lower lung and abdominal pain and valve noted to be calcified. 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 ? Bicuspid valve with dilated aortic root 4.0 cm. Patient has mild exertional dyspnea. Some muscular sounding chest pain. No palpitations or syncope. Sees dentist twice/yearr. Previous smoker. CRF elevated BP and cholesterol MRI with no coarctation and ascending aorta 3.9 cm  Doing well still driving for [employer] to [place] Was asking about TAVR and I told him it wasn't for bicuspid valves  Echo 9/14 with mild AS and moderate AR gradients 16/27 mmHg Echo 8/15 normal EF gradients 12/25 mmHg mild AR  Echo 09/21/15 normal EF mild  AS mean gradient 13 mmHg peak 27 mmHg moderate AR  Having chronic stomach pain  Not related to food and not like IBS or iscemic colitis  ROS: Denies fever, malais, weight loss, blurry vision, decreased visual acuity, cough, sputum, SOB, hemoptysis, pleuritic pain, palpitaitons, heartburn, abdominal pain, melena, lower extremity edema, claudication, or rash.  All other systems reviewed and negative  General: Affect appropriate Healthy:  appears stated age 60: normal Neck supple with no adenopathy JVP normal no bruits no thyromegaly Lungs clear with no wheezing and good diaphragmatic motion Heart:  S1/S2 AS/AR murmur, no rub, gallop or click PMI normal Abdomen: benighn, BS positve, no tenderness, no AAA no bruit.  No HSM or HJR Distal pulses intact with no bruits No edema Neuro non-focal Skin warm and dry No muscular weakness   Current Outpatient Prescriptions  Medication Sig Dispense Refill  .  acetaminophen (TYLENOL) 500 MG tablet Take 1,500 mg by mouth every 6 (six) hours as needed for mild pain.    Marland Kitchen aspirin 81 MG tablet Take 81 mg by mouth daily.     . Cholecalciferol (VITAMIN D) 2000 UNITS CAPS Take 1 capsule by mouth daily.     . fenofibrate micronized (LOFIBRA) 134 MG capsule TAKE 1 CAPSULE (134 MG TOTAL) BY MOUTH DAILY BEFORE BREAKFAST. 90 capsule 1  . Flaxseed, Linseed, (FLAXSEED OIL) 1200 MG CAPS Take 1 capsule by mouth daily.    Marland Kitchen HYDROcodone-acetaminophen (NORCO/VICODIN) 5-325 MG tablet Take 1 tablet by mouth every 6 (six) hours as needed for moderate pain.    . Omega-3 Fatty Acids (FISH OIL PO) Take 1 tablet by mouth daily.    . traZODone (DESYREL) 150 MG tablet TAKE 1/2-1 TABLET BY MOUTH AT BEDTIME AS NEEDED 90 tablet 1   No current facility-administered medications for this visit.     Allergies  Morphine  Electrocardiogram: 5/15  SR rate 78 normal   Assessment and Plan  Bicuspid Aortic Valve: mild AS moderate AR with progression of AR from 2015-2016 Will f/u echo now  GI:  Seeing pain clinic CT with no pathology low risk for SMA disease  F/u GI Elevated Triglycerides:  Continue fenobibrate  Labs with primary  CAD:  Given valve issues will order ETT to r/o CAD last ETT 2013 normal exercised 9 minutes and legs gave out  [employer]

## 2016-09-19 ENCOUNTER — Encounter: Payer: Self-pay | Admitting: Cardiovascular Disease

## 2016-09-19 ENCOUNTER — Ambulatory Visit (INDEPENDENT_AMBULATORY_CARE_PROVIDER_SITE_OTHER): Payer: BLUE CROSS/BLUE SHIELD | Admitting: Cardiovascular Disease

## 2016-09-19 VITALS — BP 104/60 | HR 54 | Ht 73.0 in | Wt 228.8 lb

## 2016-09-19 DIAGNOSIS — Q231 Congenital insufficiency of aortic valve: Secondary | ICD-10-CM | POA: Diagnosis not present

## 2016-09-19 NOTE — Patient Instructions (Addendum)
Medication Instructions:  Your physician recommends that you continue on your current medications as directed. Please refer to the Current Medication list given to you today.  Labwork: NONE  Testing/Procedures: Your physician has requested that you have an echocardiogram. Echocardiography is a painless test that uses sound waves to create images of your heart. It provides your doctor with information about the size and shape of your heart and how well your heart's chambers and valves are working. This procedure takes approximately one hour. There are no restrictions for this procedure.  Your physician has requested that you have an exercise tolerance test. For further information please visit [email or web address]. Please also follow instruction sheet, as given.  Follow-Up: Your physician wants you to follow-up in:12  months with Dr. Nishan. You will receive a reminder letter in the mail two months in advance. If you don't receive a letter, please call our office to schedule the follow-up appointment.   If you need a refill on your cardiac medications before your next appointment, please call your pharmacy.    

## 2016-09-26 ENCOUNTER — Ambulatory Visit (INDEPENDENT_AMBULATORY_CARE_PROVIDER_SITE_OTHER): Payer: BLUE CROSS/BLUE SHIELD | Admitting: Internal Medicine

## 2016-09-26 ENCOUNTER — Encounter: Payer: Self-pay | Admitting: Internal Medicine

## 2016-09-26 VITALS — BP 110/72 | HR 76 | Temp 97.0°F | Resp 16 | Ht 73.0 in | Wt 222.6 lb

## 2016-09-26 DIAGNOSIS — R7303 Prediabetes: Secondary | ICD-10-CM

## 2016-09-26 DIAGNOSIS — Z23 Encounter for immunization: Secondary | ICD-10-CM

## 2016-09-26 DIAGNOSIS — R030 Elevated blood-pressure reading, without diagnosis of hypertension: Secondary | ICD-10-CM | POA: Diagnosis not present

## 2016-09-26 DIAGNOSIS — Z79899 Other long term (current) drug therapy: Secondary | ICD-10-CM | POA: Diagnosis not present

## 2016-09-26 DIAGNOSIS — E559 Vitamin D deficiency, unspecified: Secondary | ICD-10-CM

## 2016-09-26 DIAGNOSIS — E782 Mixed hyperlipidemia: Secondary | ICD-10-CM | POA: Diagnosis not present

## 2016-09-26 LAB — HEPATIC FUNCTION PANEL
ALBUMIN: 4.2 g/dL (ref 3.6–5.1)
ALT: 21 U/L (ref 9–46)
AST: 17 U/L (ref 10–35)
Alkaline Phosphatase: 49 U/L (ref 40–115)
BILIRUBIN DIRECT: 0.2 mg/dL (ref <=0.2)
BILIRUBIN TOTAL: 0.7 mg/dL (ref 0.2–1.2)
Indirect Bilirubin: 0.5 mg/dL (ref 0.2–1.2)
Total Protein: 6.5 g/dL (ref 6.1–8.1)

## 2016-09-26 LAB — LIPID PANEL
CHOL/HDL RATIO: 5.9 ratio — AB (ref <=5.0)
CHOLESTEROL: 182 mg/dL (ref 125–200)
HDL: 31 mg/dL — ABNORMAL LOW (ref >=40)
LDL Cholesterol: 111 mg/dL (ref <130)
Triglycerides: 199 mg/dL — ABNORMAL HIGH (ref <150)
VLDL: 40 mg/dL — ABNORMAL HIGH (ref <30)

## 2016-09-26 LAB — CBC WITH DIFFERENTIAL/PLATELET
BASOS PCT: 0 %
Basophils Absolute: 0 cells/uL (ref 0–200)
EOS PCT: 3 %
Eosinophils Absolute: 201 cells/uL (ref 15–500)
HCT: 45 % (ref 38.5–50.0)
Hemoglobin: 15.5 g/dL (ref 13.2–17.1)
LYMPHS PCT: 23 %
Lymphs Abs: 1541 cells/uL (ref 850–3900)
MCH: 31.6 pg (ref 27.0–33.0)
MCHC: 34.4 g/dL (ref 32.0–36.0)
MCV: 91.6 fL (ref 80.0–100.0)
MONOS PCT: 11 %
MPV: 10.5 fL (ref 7.5–12.5)
Monocytes Absolute: 737 cells/uL (ref 200–950)
NEUTROS ABS: 4221 {cells}/uL (ref 1500–7800)
Neutrophils Relative %: 63 %
PLATELETS: 191 10*3/uL (ref 140–400)
RBC: 4.91 MIL/uL (ref 4.20–5.80)
RDW: 13.9 % (ref 11.0–15.0)
WBC: 6.7 10*3/uL (ref 3.8–10.8)

## 2016-09-26 LAB — BASIC METABOLIC PANEL WITH GFR
BUN: 15 mg/dL (ref 7–25)
CALCIUM: 9.5 mg/dL (ref 8.6–10.3)
CO2: 24 mmol/L (ref 20–31)
CREATININE: 1.3 mg/dL — AB (ref 0.70–1.25)
Chloride: 104 mmol/L (ref 98–110)
GFR, Est African American: 68 mL/min (ref >=60)
GFR, Est Non African American: 59 mL/min — ABNORMAL LOW (ref >=60)
Glucose, Bld: 100 mg/dL — ABNORMAL HIGH (ref 65–99)
Potassium: 4.3 mmol/L (ref 3.5–5.3)
SODIUM: 138 mmol/L (ref 135–146)

## 2016-09-26 LAB — TSH: TSH: 4.71 m[IU]/L — AB (ref 0.40–4.50)

## 2016-09-26 LAB — MAGNESIUM: MAGNESIUM: 1.9 mg/dL (ref 1.5–2.5)

## 2016-09-26 NOTE — Progress Notes (Signed)
Anthony Paul Unk Pinto, M.D.        Anthony Paul. Anthony Paul, P.A.-C       Anthony Paul, P.A.-C  [facility]                [address] Anthony Paul, Anthony Paul [id number] Telephone [phone] Telefax [phone] ______________________________________________________________________     This very nice 60 y.o. MWM who presents for 6 month follow up with hx/o labile  Hyperlipidemia, Pre-Diabetes and Vitamin D Deficiency. Patient also has a bicuspid AoV and is followed annually with 2D echo for mils AoS/AoR - last seen 1 week ago by Dr Anthony Paul.      Patient has hx/po elevated BP and is followed expectantly  & BP has been controlled at home. Today's BP is 110/72. Patient has had no complaints of any cardiac type chest pain, palpitations, dyspnea/orthopnea/PND, dizziness, claudication, or dependent edema.     Hyperlipidemia is controlled with diet & meds. Patient denies myalgias or other med SE's. Last Lipids were near goal: Lab Results  Component Value Date   CHOL 164 07/04/2016   HDL 37 (L) 07/04/2016   LDLCALC 106 07/04/2016   TRIG 104 07/04/2016   CHOLHDL 4.4 07/04/2016      Also, the patient has history of PreDiabetes and has had no symptoms of reactive hypoglycemia, diabetic polys, paresthesias or visual blurring.  Last A1c was near goal: Lab Results  Component Value Date   HGBA1C 5.9 (H) 03/14/2016      Further, the patient also has history of Vitamin D Deficiency and supplements vitamin D without any suspected side-effects. Last vitamin D was still sl low: Lab Results  Component Value Date   VD25OH 33 03/14/2016   Current Outpatient Prescriptions on File Prior to Visit  Medication Sig  . acetaminophen (TYLENOL) 500 MG tablet Take 1,500 mg by mouth every 6 (six) hours as needed for mild pain.  Marland Kitchen aspirin 81 MG tablet Take 81 mg by mouth daily.   . Cholecalciferol (VITAMIN D) 2000 UNITS CAPS Take 1  capsule by mouth daily.   . fenofibrate micronized (LOFIBRA) 134 MG capsule TAKE 1 CAPSULE (134 MG TOTAL) BY MOUTH DAILY BEFORE BREAKFAST.  Marland Kitchen Flaxseed, Linseed, (FLAXSEED OIL) 1200 MG CAPS Take 1 capsule by mouth daily.  Marland Kitchen HYDROcodone-acetaminophen (NORCO/VICODIN) 5-325 MG tablet Take 1 tablet by mouth every 6 (six) hours as needed for moderate pain.  . Omega-3 Fatty Acids (FISH OIL PO) Take 1 tablet by mouth daily.  . traZODone (DESYREL) 150 MG tablet TAKE 1/2-1 TABLET BY MOUTH AT BEDTIME AS NEEDED   No current facility-administered medications on file prior to visit.    Allergies  Allergen Reactions  . Morphine Itching    Can take hydrocodone without issues   PMHx:   Past Medical History:  Diagnosis Date  . Abnormal CT scan, chest    Blebs on CT chest but NORMAL PFTs- no COPD  . Aortic stenosis   . Bicuspid aortic valve    Echo 2014  EF 55-60% Mild to moderate AR mild AS with mean gradient 18 mmHg and peak of 25 mmHg. ? Bicuspid valve with dilated aortic root 4.0 cm.  . Diverticulitis   . DJD (degenerative joint disease)   . ED (erectile dysfunction)   . Elevated blood pressure reading without diagnosis of hypertension   . Hypercholesteremia   . Skin cancer  Immunization History  Administered Date(s) Administered  . Influenza Split 10/05/2015  . Influenza,inj,Quad PF,36+ Mos 11/15/2013  . Influenza-Unspecified 12/19/2009  . PPD Test 03/09/2015, 03/14/2016  . Pneumococcal Conjugate-13 11/15/2013  . Pneumococcal-Unspecified 12/20/1995  . Td 12/19/2004  . Tdap 03/09/2015   Past Surgical History:  Procedure Laterality Date  . COLON SURGERY  Jan 2012  . COLONOSCOPY  12/2013   Dr. Earlean Shawl, due every 5-7 years  . HEMORROIDECTOMY    . SHOULDER SURGERY    . TONSILLECTOMY    . UPPER GI ENDOSCOPY  2015   Dr. Earlean Shawl   FHx:    Reviewed / unchanged  SHx:    Reviewed / unchanged  Systems Review:  Constitutional: Denies fever, chills, wt changes, headaches, insomnia,  fatigue, night sweats, change in appetite. Eyes: Denies redness, blurred vision, diplopia, discharge, itchy, watery eyes.  ENT: Denies discharge, congestion, post nasal drip, epistaxis, sore throat, earache, hearing loss, dental pain, tinnitus, vertigo, sinus pain, snoring.  CV: Denies chest pain, palpitations, irregular heartbeat, syncope, dyspnea, diaphoresis, orthopnea, PND, claudication or edema. Respiratory: denies cough, dyspnea, DOE, pleurisy, hoarseness, laryngitis, wheezing.  Gastrointestinal: Denies dysphagia, odynophagia, heartburn, reflux, water brash, abdominal pain or cramps, nausea, vomiting, bloating, diarrhea, constipation, hematemesis, melena, hematochezia  or hemorrhoids. Genitourinary: Denies dysuria, frequency, urgency, nocturia, hesitancy, discharge, hematuria or flank pain. Musculoskeletal: Denies arthralgias, myalgias, stiffness, jt. swelling, pain, limping or strain/sprain.  Skin: Denies pruritus, rash, hives, warts, acne, eczema or change in skin lesion(s). Neuro: No weakness, tremor, incoordination, spasms, paresthesia or pain. Psychiatric: Denies confusion, memory loss or sensory loss. Endo: Denies change in weight, skin or hair change.  Heme/Lymph: No excessive bleeding, bruising or enlarged lymph nodes.  Physical Exam BP 110/72   Pulse 76   Temp 97 F (36.1 C)   Resp 16   Ht 6\' 1"  (1.854 m)   Wt 222 lb 9.6 oz (101 kg)   BMI 29.37 kg/m   Appears well nourished and in no distress.  Eyes: PERRLA, EOMs, conjunctiva no swelling or erythema. Sinuses: No frontal/maxillary tenderness ENT/Mouth: EAC's clear, TM's nl w/o erythema, bulging. Nares clear w/o erythema, swelling, exudates. Oropharynx clear without erythema or exudates. Oral hygiene is good. Tongue normal, non obstructing. Hearing intact.  Neck: Supple. Thyroid nl. Car 2+/2+ without bruits, nodes or JVD. Chest: Respirations nl with BS clear & equal w/o rales, rhonchi, wheezing or stridor.  Cor: Heart  sounds normal w/ regular rate and rhythm with soft A0S/AoR m & no gallops, clicks, or rubs. Peripheral pulses normal and equal  without edema.  Abdomen: Soft & bowel sounds normal. Non-tender w/o guarding, rebound, hernias, masses, or organomegaly.  Lymphatics: Unremarkable.  Musculoskeletal: Full ROM all peripheral extremities, joint stability, 5/5 strength, and normal gait.  Skin: Warm, dry without exposed rashes, lesions or ecchymosis apparent.  Neuro: Cranial nerves intact, reflexes equal bilaterally. Sensory-motor testing grossly intact. Tendon reflexes grossly intact.  Pysch: Alert & oriented x 3.  Insight and judgement nl & appropriate. No ideations.  Assessment and Plan:  1. Borderline systolic HTN  - Continue medication, monitor blood pressure at home. Continue DASH diet. Reminder to go to the ER if any CP, SOB, nausea, dizziness, severe HA, changes vision/speech, left arm numbness and tingling and jaw pain. - TSH  2. Mixed hyperlipidemia  - Continue diet/meds, exercise,& lifestyle modifications. Continue monitor periodic cholesterol/liver & renal functions  - Lipid panel - TSH  3. Prediabetes  - Continue diet, exercise, lifestyle modifications. Monitor appropriate labs. - Hemoglobin A1c -  Insulin, random  4. Vitamin D deficiency  - Continue supplementation. - VITAMIN D 25 Hydroxy   5. Medication management  - CBC with Differential/Platelet - BASIC METABOLIC PANEL WITH GFR - Hepatic function panel - Magnesium  6. Need for prophylactic vaccination and inoculation against influenza  - Flu Vaccine QUAD with presevative   Recommended regular exercise, BP monitoring, weight control, and discussed med and SE's. Recommended labs to assess and monitor clinical status. Further disposition pending results of labs. Over 30 minutes of exam, counseling, chart review was performed

## 2016-09-26 NOTE — Patient Instructions (Signed)

## 2016-09-27 LAB — HEMOGLOBIN A1C
HEMOGLOBIN A1C: 5.4 % (ref <5.7)
MEAN PLASMA GLUCOSE: 108 mg/dL

## 2016-09-27 LAB — VITAMIN D 25 HYDROXY (VIT D DEFICIENCY, FRACTURES): Vit D, 25-Hydroxy: 51 ng/mL (ref 30–100)

## 2016-09-27 LAB — INSULIN, RANDOM: INSULIN: 6.5 u[IU]/mL (ref 2.0–19.6)

## 2016-09-28 ENCOUNTER — Other Ambulatory Visit: Payer: Self-pay | Admitting: Internal Medicine

## 2016-10-10 ENCOUNTER — Other Ambulatory Visit: Payer: Self-pay

## 2016-10-10 ENCOUNTER — Ambulatory Visit (INDEPENDENT_AMBULATORY_CARE_PROVIDER_SITE_OTHER): Payer: BLUE CROSS/BLUE SHIELD

## 2016-10-10 ENCOUNTER — Ambulatory Visit (HOSPITAL_COMMUNITY): Payer: BLUE CROSS/BLUE SHIELD | Attending: Cardiovascular Disease

## 2016-10-10 ENCOUNTER — Other Ambulatory Visit: Payer: Self-pay | Admitting: Cardiovascular Disease

## 2016-10-10 DIAGNOSIS — I5030 Unspecified diastolic (congestive) heart failure: Secondary | ICD-10-CM | POA: Insufficient documentation

## 2016-10-10 DIAGNOSIS — Q231 Congenital insufficiency of aortic valve: Secondary | ICD-10-CM

## 2016-10-10 DIAGNOSIS — R0609 Other forms of dyspnea: Secondary | ICD-10-CM

## 2016-10-10 DIAGNOSIS — I700 Atherosclerosis of aorta: Secondary | ICD-10-CM | POA: Diagnosis not present

## 2016-10-10 DIAGNOSIS — I350 Nonrheumatic aortic (valve) stenosis: Secondary | ICD-10-CM | POA: Diagnosis not present

## 2016-10-10 DIAGNOSIS — R0789 Other chest pain: Secondary | ICD-10-CM

## 2016-10-10 DIAGNOSIS — R079 Chest pain, unspecified: Secondary | ICD-10-CM

## 2016-10-10 LAB — EXERCISE TOLERANCE TEST
CHL CUP RESTING HR STRESS: 68 {beats}/min
CHL RATE OF PERCEIVED EXERTION: 17
CSEPED: 9 min
CSEPEW: 10.1 METS
CSEPPHR: 142 {beats}/min
Exercise duration (sec): 0 s
MPHR: 159 {beats}/min
Percent HR: 89 %

## 2017-01-02 ENCOUNTER — Ambulatory Visit (INDEPENDENT_AMBULATORY_CARE_PROVIDER_SITE_OTHER): Payer: BLUE CROSS/BLUE SHIELD | Admitting: Physician Assistant

## 2017-01-02 ENCOUNTER — Encounter: Payer: Self-pay | Admitting: Physician Assistant

## 2017-01-02 VITALS — BP 120/70 | HR 71 | Temp 97.7°F | Resp 14 | Ht 73.0 in | Wt 230.0 lb

## 2017-01-02 DIAGNOSIS — R030 Elevated blood-pressure reading, without diagnosis of hypertension: Secondary | ICD-10-CM | POA: Diagnosis not present

## 2017-01-02 DIAGNOSIS — E782 Mixed hyperlipidemia: Secondary | ICD-10-CM

## 2017-01-02 DIAGNOSIS — E559 Vitamin D deficiency, unspecified: Secondary | ICD-10-CM | POA: Diagnosis not present

## 2017-01-02 DIAGNOSIS — Z79899 Other long term (current) drug therapy: Secondary | ICD-10-CM

## 2017-01-02 DIAGNOSIS — J449 Chronic obstructive pulmonary disease, unspecified: Secondary | ICD-10-CM

## 2017-01-02 LAB — CBC WITH DIFFERENTIAL/PLATELET
BASOS ABS: 0 {cells}/uL (ref 0–200)
Basophils Relative: 0 %
EOS ABS: 180 {cells}/uL (ref 15–500)
Eosinophils Relative: 3 %
HCT: 44.1 % (ref 38.5–50.0)
HEMOGLOBIN: 14.7 g/dL (ref 13.2–17.1)
LYMPHS ABS: 1440 {cells}/uL (ref 850–3900)
Lymphocytes Relative: 24 %
MCH: 31.1 pg (ref 27.0–33.0)
MCHC: 33.3 g/dL (ref 32.0–36.0)
MCV: 93.4 fL (ref 80.0–100.0)
MPV: 11 fL (ref 7.5–12.5)
Monocytes Absolute: 720 cells/uL (ref 200–950)
Monocytes Relative: 12 %
NEUTROS ABS: 3660 {cells}/uL (ref 1500–7800)
NEUTROS PCT: 61 %
Platelets: 201 10*3/uL (ref 140–400)
RBC: 4.72 MIL/uL (ref 4.20–5.80)
RDW: 13.1 % (ref 11.0–15.0)
WBC: 6 10*3/uL (ref 3.8–10.8)

## 2017-01-02 LAB — LIPID PANEL
CHOL/HDL RATIO: 5.5 ratio — AB (ref <5.0)
Cholesterol: 170 mg/dL (ref <200)
HDL: 31 mg/dL — AB (ref >40)
LDL CALC: 111 mg/dL — AB (ref <100)
Triglycerides: 141 mg/dL (ref <150)
VLDL: 28 mg/dL (ref <30)

## 2017-01-02 LAB — BASIC METABOLIC PANEL WITH GFR
BUN: 13 mg/dL (ref 7–25)
CO2: 25 mmol/L (ref 20–31)
Calcium: 9.7 mg/dL (ref 8.6–10.3)
Chloride: 106 mmol/L (ref 98–110)
Creat: 1.3 mg/dL — ABNORMAL HIGH (ref 0.70–1.25)
GFR, EST NON AFRICAN AMERICAN: 59 mL/min — AB (ref >=60)
GFR, Est African American: 68 mL/min (ref >=60)
Glucose, Bld: 96 mg/dL (ref 65–99)
POTASSIUM: 4.7 mmol/L (ref 3.5–5.3)
SODIUM: 139 mmol/L (ref 135–146)

## 2017-01-02 LAB — TSH: TSH: 2.85 m[IU]/L (ref 0.40–4.50)

## 2017-01-02 LAB — HEPATIC FUNCTION PANEL
ALK PHOS: 53 U/L (ref 40–115)
ALT: 16 U/L (ref 9–46)
AST: 15 U/L (ref 10–35)
Albumin: 4.2 g/dL (ref 3.6–5.1)
BILIRUBIN DIRECT: 0.1 mg/dL (ref <=0.2)
BILIRUBIN INDIRECT: 0.4 mg/dL (ref 0.2–1.2)
BILIRUBIN TOTAL: 0.5 mg/dL (ref 0.2–1.2)
Total Protein: 6.3 g/dL (ref 6.1–8.1)

## 2017-01-02 NOTE — Progress Notes (Signed)
Assessment and Plan:  Hypertension:  -Continue medication,  -monitor blood pressure at home.  -Continue DASH diet.   -Reminder to go to the ER if any CP, SOB, nausea, dizziness, severe HA, changes vision/speech, left arm numbness and tingling, and jaw pain.  Cholesterol: -Continue diet and exercise.  -Check cholesterol.   Vitamin D Def: -continue medications.   Continue diet and meds as discussed. Further disposition pending results of labs. Future Appointments Date Time Provider [place]  04/04/2017 9:00 AM Unk Pinto, MD [facility] None    HPI 61 y.o. male  presents for 3 month follow up with hypertension, hyperlipidemia, prediabetes and vitamin D.  His blood pressure has been controlled at home, today their BP is BP: 120/70.   He does not workout but he is retiring at the end of march.  He denies chest pain, shortness of breath, dizziness. Has bicuspid valve, follows with Cardio, sees pain management for rib pain.   He is on cholesterol medication and denies myalgias. His cholesterol is at goal. The cholesterol last visit was:   Lab Results  Component Value Date   CHOL 182 09/26/2016   HDL 31 (L) 09/26/2016   LDLCALC 111 09/26/2016   TRIG 199 (H) 09/26/2016   CHOLHDL 5.9 (H) 09/26/2016    He has been working on diet and exercise for prediabetes, and denies foot ulcerations, hyperglycemia, hypoglycemia , increased appetite, nausea, paresthesia of the feet, polydipsia, polyuria, visual disturbances, vomiting and weight loss. Last A1C in the office was:  Lab Results  Component Value Date   HGBA1C 5.4 09/26/2016   Patient is on Vitamin D supplement.  Lab Results  Component Value Date   VD25OH 51 09/26/2016   BMI is Body mass index is 30.34 kg/m., he is working on diet and exercise. Wt Readings from Last 3 Encounters:  01/02/17 230 lb (104.3 kg)  09/26/16 222 lb 9.6 oz (101 kg)  09/19/16 228 lb 12.8 oz (103.8 kg)    Current Medications:  Current  Outpatient Prescriptions on File Prior to Visit  Medication Sig Dispense Refill  . acetaminophen (TYLENOL) 500 MG tablet Take 1,500 mg by mouth every 6 (six) hours as needed for mild pain.    Marland Kitchen aspirin 81 MG tablet Take 81 mg by mouth daily.     . Cholecalciferol (VITAMIN D) 2000 UNITS CAPS Take 1 capsule by mouth daily.     . fenofibrate micronized (LOFIBRA) 134 MG capsule TAKE 1 CAPSULE (134 MG TOTAL) BY MOUTH DAILY BEFORE BREAKFAST. 90 capsule 1  . Flaxseed, Linseed, (FLAXSEED OIL) 1200 MG CAPS Take 1 capsule by mouth daily.    Marland Kitchen HYDROcodone-acetaminophen (NORCO/VICODIN) 5-325 MG tablet Take 1 tablet by mouth every 6 (six) hours as needed for moderate pain.    . Omega-3 Fatty Acids (FISH OIL PO) Take 1 tablet by mouth daily.    . traZODone (DESYREL) 150 MG tablet TAKE 1/2-1 TABLET BY MOUTH AT BEDTIME AS NEEDED 90 tablet 1   No current facility-administered medications on file prior to visit.     Medical History:  Past Medical History:  Diagnosis Date  . Abnormal CT scan, chest    Blebs on CT chest but NORMAL PFTs- no COPD  . Aortic stenosis   . Bicuspid aortic valve    Echo 2014  EF 55-60% Mild to moderate AR mild AS with mean gradient 18 mmHg and peak of 25 mmHg. ? Bicuspid valve with dilated aortic root 4.0 cm.  . Diverticulitis   . DJD (  degenerative joint disease)   . ED (erectile dysfunction)   . Elevated blood pressure reading without diagnosis of hypertension   . Hypercholesteremia   . Skin cancer     Allergies:  Allergies  Allergen Reactions  . Morphine Itching    Can take hydrocodone without issues     Review of Systems:  Review of Systems  Constitutional: Negative for chills, fever and malaise/fatigue.  HENT: Positive for congestion and ear pain. Negative for sore throat.   Eyes: Negative.   Respiratory: Negative for cough, hemoptysis and shortness of breath.   Cardiovascular: Negative for chest pain, palpitations and leg swelling.  Gastrointestinal: Negative  for abdominal pain, blood in stool, constipation, diarrhea, heartburn and melena.  Genitourinary: Negative.   Skin: Negative.   Neurological: Negative for dizziness, sensory change, loss of consciousness and headaches.  Psychiatric/Behavioral: Negative for depression. The patient is not nervous/anxious and does not have insomnia.     Family history- Review and unchanged  Social history- Review and unchanged  Physical Exam: BP 120/70   Pulse 71   Temp 97.7 F (36.5 C)   Resp 14   Ht 6\' 1"  (1.854 m)   Wt 230 lb (104.3 kg)   SpO2 99%   BMI 30.34 kg/m  Wt Readings from Last 3 Encounters:  01/02/17 230 lb (104.3 kg)  09/26/16 222 lb 9.6 oz (101 kg)  09/19/16 228 lb 12.8 oz (103.8 kg)    General Appearance: Well nourished well developed, in no apparent distress. Eyes: PERRLA, EOMs, conjunctiva no swelling or erythema ENT/Mouth: Ear canals normal without obstruction, swelling, erythma, discharge.  TMs normal bilaterally.  Oropharynx moist, clear, without exudate, or postoropharyngeal swelling. Neck: Supple, thyroid normal,no cervical adenopathy  Respiratory: Respiratory effort normal, Breath sounds clear A&P without rhonchi, wheeze, or rale.  No retractions, no accessory usage. Cardio: RRR with no MRGs. Brisk peripheral pulses without edema.  Abdomen: Soft, + BS,  Non tender, no guarding, rebound, hernias, masses. Musculoskeletal: Full ROM, 5/5 strength, Normal gait Skin: Warm, dry without rashes, lesions, ecchymosis.  Neuro: Awake and oriented X 3, Cranial nerves intact. Normal muscle tone, no cerebellar symptoms. Psych: Normal affect, Insight and Judgment appropriate.    [name], PA-C 10:52 AM [facility]

## 2017-01-03 LAB — MAGNESIUM: MAGNESIUM: 2 mg/dL (ref 1.5–2.5)

## 2017-01-03 LAB — VITAMIN D 25 HYDROXY (VIT D DEFICIENCY, FRACTURES): Vit D, 25-Hydroxy: 53 ng/mL (ref 30–100)

## 2017-02-25 ENCOUNTER — Other Ambulatory Visit: Payer: Self-pay | Admitting: Internal Medicine

## 2017-03-22 ENCOUNTER — Other Ambulatory Visit: Payer: Self-pay | Admitting: Physician Assistant

## 2017-04-04 ENCOUNTER — Encounter: Payer: Self-pay | Admitting: Internal Medicine

## 2017-04-05 NOTE — Progress Notes (Signed)
Subjective:     Anthony Paul is a 61 y.o. male who presents for evaluation of possible abscess. Has had for several year small bump on back of his head, last week has gotten tender and painful and much larger. No discharge, no warmth, no fever, chills.   Blood pressure 130/80, pulse 86, temperature 97.7 F (36.5 C), resp. rate 14, height 6\' 1"  (1.854 m), weight 224 lb (101.6 kg), SpO2 99 %.  Medications Current Outpatient Prescriptions on File Prior to Visit  Medication Sig  . acetaminophen (TYLENOL) 500 MG tablet Take 1,500 mg by mouth every 6 (six) hours as needed for mild pain.  Marland Kitchen aspirin 81 MG tablet Take 81 mg by mouth daily.   . Cholecalciferol (VITAMIN D) 2000 UNITS CAPS Take 1 capsule by mouth daily.   . fenofibrate micronized (LOFIBRA) 134 MG capsule TAKE ONE CAPSULE BY MOUTH EVERY DAY BEFORE BREAKFAST.  Marland Kitchen Flaxseed, Linseed, (FLAXSEED OIL) 1200 MG CAPS Take 1 capsule by mouth daily.  Marland Kitchen HYDROcodone-acetaminophen (NORCO/VICODIN) 5-325 MG tablet Take 1 tablet by mouth every 6 (six) hours as needed for moderate pain.  . Omega-3 Fatty Acids (FISH OIL PO) Take 1 tablet by mouth daily.  . traZODone (DESYREL) 150 MG tablet TAKE 1/2-1 TABLET BY MOUTH AT BEDTIME AS NEEDED   No current facility-administered medications on file prior to visit.     Problem list He has COPD (chronic obstructive pulmonary disease) ([place]); Bicuspid aortic valve; Mixed hyperlipidemia; Borderline systolic HTN; ED (erectile dysfunction); DJD (degenerative joint disease); Prediabetes; Vitamin D deficiency; and Medication management on his problem list.   Objective:    There is an area characterized by a soft mobile subQ mass, tenderness measuring 5 cm in greatest dimension. Location: posterior right scalp at hair line   Assessment:     Sebaceous Cyst more likely than abscess, will treat with ABX    Plan:    Apply hot compresses frequently to promote drainage. Reassured that this represents a benign  process. Oral antibiotics -- see med orders. Referral to derm for possible removal

## 2017-04-06 ENCOUNTER — Encounter: Payer: Self-pay | Admitting: Physician Assistant

## 2017-04-06 ENCOUNTER — Ambulatory Visit (INDEPENDENT_AMBULATORY_CARE_PROVIDER_SITE_OTHER): Payer: BLUE CROSS/BLUE SHIELD | Admitting: Physician Assistant

## 2017-04-06 VITALS — BP 130/80 | HR 86 | Temp 97.7°F | Resp 14 | Ht 73.0 in | Wt 224.0 lb

## 2017-04-06 DIAGNOSIS — L723 Sebaceous cyst: Secondary | ICD-10-CM

## 2017-04-06 MED ORDER — SULFAMETHOXAZOLE-TRIMETHOPRIM 800-160 MG PO TABS: 1.0000 | ORAL_TABLET | Freq: Two times a day (BID) | ORAL | 0 refills | Status: DC

## 2017-04-06 NOTE — Patient Instructions (Addendum)
Will refer to derm Do heating pad Finish ABX with food   Epidermal Cyst An epidermal cyst is sometimes called an epidermal inclusion cyst or an infundibular cyst. It is a sac made of skin tissue. The sac contains a substance called keratin. Keratin is a protein that is normally secreted through the hair follicles. When keratin becomes trapped in the top layer of skin (epidermis), it can form an epidermal cyst. Epidermal cysts are usually found on the face, neck, trunk, and genitals. These cysts are usually harmless (benign), and they may not cause symptoms unless they become infected. It is important not to pop epidermal cysts yourself. What are the causes? This condition may be caused by:  A blocked hair follicle.  A hair that curls and re-enters the skin instead of growing straight out of the skin (ingrown hair).  A blocked pore.  Irritated skin.  An injury to the skin.  Certain conditions that are passed along from parent to child (inherited).  Human papillomavirus (HPV). What increases the risk? The following factors may make you more likely to develop an epidermal cyst:  Having acne.  Being overweight.  Wearing tight clothing. What are the signs or symptoms? The only symptom of this condition may be a small, painless lump underneath the skin. When an epidermal cyst becomes infected, symptoms may include:  Redness.  Inflammation.  Tenderness.  Warmth.  Fever.  Keratin draining from the cyst. Keratin may look like a grayish-white, bad-smelling substance.  Pus draining from the cyst. How is this diagnosed? This condition is diagnosed with a physical exam. In some cases, you may have a sample of tissue (biopsy) taken from your cyst to be examined under a microscope or tested for bacteria. You may be referred to a health care provider who specializes in skin care (dermatologist). How is this treated? In many cases, epidermal cysts go away on their own without  treatment. If a cyst becomes infected, treatment may include:  Opening and draining the cyst. After draining, minor surgery to remove the rest of the cyst may be done.  Antibiotic medicine to help prevent infection.  Injections of medicines (steroids) that help to reduce inflammation.  Surgery to remove the cyst. Surgery may be done if:  The cyst becomes large.  The cyst bothers you.  There is a chance that the cyst could turn into cancer. Follow these instructions at home:  Take over-the-counter and prescription medicines only as told by your health care provider.  If you were prescribed an antibiotic, use it as told by your health care provider. Do not stop using the antibiotic even if you start to feel better.  Keep the area around your cyst clean and dry.  Wear loose, dry clothing.  Do not try to pop your cyst.  Avoid touching your cyst.  Check your cyst every day for signs of infection.  Keep all follow-up visits as told by your health care provider. This is important. How is this prevented?  Wear clean, dry, clothing.  Avoid wearing tight clothing.  Keep your skin clean and dry. Shower or take baths every day.  Wash your body with a benzoyl peroxide wash when you shower or bathe. Contact a health care provider if:  Your cyst develops symptoms of infection.  Your condition is not improving or is getting worse.  You develop a cyst that looks different from other cysts you have had.  You have a fever. Get help right away if:  Redness spreads from  the cyst into the surrounding area. This information is not intended to replace advice given to you by your health care provider. Make sure you discuss any questions you have with your health care provider. Document Released: 11/05/2004 Document Revised: 08/03/2016 Document Reviewed: 10/07/2015 Elsevier Interactive Patient Education  2017 [employer]   Skin Abscess A skin abscess is an infected area on or  under your skin that contains pus and other material. An abscess can happen almost anywhere on your body. Some abscesses break open (rupture) on their own. Most continue to get worse unless they are treated. The infection can spread deeper into the body and into your blood, which can make you feel sick. Treatment usually involves draining the abscess. Follow these instructions at home: Abscess Care   If you have an abscess that has not drained, place a warm, clean, wet washcloth over the abscess several times a day. Do this as told by your doctor.  Follow instructions from your doctor about how to take care of your abscess. Make sure you:  Cover the abscess with a bandage (dressing).  Change your bandage or gauze as told by your doctor.  Wash your hands with soap and water before you change the bandage or gauze. If you cannot use soap and water, use hand sanitizer.  Check your abscess every day for signs that the infection is getting worse. Check for:  More redness, swelling, or pain.  More fluid or blood.  Warmth.  More pus or a bad smell. Medicines    Take over-the-counter and prescription medicines only as told by your doctor.  If you were prescribed an antibiotic medicine, take it as told by your doctor. Do not stop taking the antibiotic even if you start to feel better. General instructions   To avoid spreading the infection:  Do not share personal care items, towels, or hot tubs with others.  Avoid making skin-to-skin contact with other people.  Keep all follow-up visits as told by your doctor. This is important. Contact a doctor if:  You have more redness, swelling, or pain around your abscess.  You have more fluid or blood coming from your abscess.  Your abscess feels warm when you touch it.  You have more pus or a bad smell coming from your abscess.  You have a fever.  Your muscles ache.  You have chills.  You feel sick. Get help right away if:  You  have very bad (severe) pain.  You see red streaks on your skin spreading away from the abscess. This information is not intended to replace advice given to you by your health care provider. Make sure you discuss any questions you have with your health care provider. Document Released: 05/23/2008 Document Revised: 07/31/2016 Document Reviewed: 10/14/2015 Elsevier Interactive Patient Education  2017 [employer].

## 2017-04-10 NOTE — Progress Notes (Signed)
Complete Physical  Assessment and Plan:  Bicuspid aortic valve Follow up with cardio q yearly, no symptoms  Borderline systolic HTN - continue medications, DASH diet, exercise and monitor at home. Call if greater than 130/80.  -     CBC with Differential/Platelet -     BASIC METABOLIC PANEL WITH GFR -     Hepatic function panel -     TSH -     Microalbumin / creatinine urine ratio -     Urinalysis, Routine w reflex microscopic  Chronic obstructive pulmonary disease, unspecified COPD type (HCC) Not smoking, avoid triggers, well controlled  Osteoarthritis, unspecified osteoarthritis type, unspecified site Follow ortho PRN  Erectile dysfunction, unspecified erectile dysfunction type Decrease weight  Mixed hyperlipidemia -continue medications, check lipids, decrease fatty foods, increase activity.  -     Lipid panel  Prediabetes Discussed general issues about diabetes pathophysiology and management., Educational material distributed., Suggested low cholesterol diet., Encouraged aerobic exercise., Discussed foot care., Reminded to get yearly retinal exam. -     Hemoglobin A1c  Vitamin D deficiency -     VITAMIN D 25 Hydroxy (Vit-D Deficiency, Fractures)  Medication management -     Magnesium  Encounter for general adult medical examination with abnormal findings Call to see if due for colonoscopy  Prostate cancer screening -     PSA  Discussed med's effects and SE's. Screening labs and tests as requested with regular follow-up as recommended. Over 40 minutes of exam, counseling, chart review and critical decision making was performed No future appointments.   HPI Patient presents for a complete physical.   His blood pressure has been controlled at home, today their BP is BP: 118/72 He does workout. He denies chest pain, shortness of breath, dizziness.  He is on cholesterol medication, fenofibrate and denies myalgias. His cholesterol is not at goal. The cholesterol  last visit was:   Lab Results  Component Value Date   CHOL 170 01/02/2017   HDL 31 (L) 01/02/2017   LDLCALC 111 (H) 01/02/2017   TRIG 141 01/02/2017   CHOLHDL 5.5 (H) 01/02/2017    Last A1C in the office was:  Lab Results  Component Value Date   HGBA1C 5.4 09/26/2016   Last GFR: Lab Results  Component Value Date   GFRNONAA 59 (L) 01/02/2017    Patient is on Vitamin D supplement.   Lab Results  Component Value Date   VD25OH 53 01/02/2017     Last PSA was: Lab Results  Component Value Date   PSA 0.85 03/14/2016   BMI is Body mass index is 29.71 kg/m., he is working on diet and exercise. Wt Readings from Last 3 Encounters:  04/11/17 225 lb 3.2 oz (102.2 kg)  04/06/17 224 lb (101.6 kg)  01/02/17 230 lb (104.3 kg)    Current Medications:  Current Outpatient Prescriptions on File Prior to Visit  Medication Sig Dispense Refill  . acetaminophen (TYLENOL) 500 MG tablet Take 1,500 mg by mouth every 6 (six) hours as needed for mild pain.    Marland Kitchen aspirin 81 MG tablet Take 81 mg by mouth daily.     . Cholecalciferol (VITAMIN D) 2000 UNITS CAPS Take 1 capsule by mouth daily.     . fenofibrate micronized (LOFIBRA) 134 MG capsule TAKE ONE CAPSULE BY MOUTH EVERY DAY BEFORE BREAKFAST. 90 capsule 1  . Flaxseed, Linseed, (FLAXSEED OIL) 1200 MG CAPS Take 1 capsule by mouth daily.    Marland Kitchen HYDROcodone-acetaminophen (NORCO/VICODIN) 5-325 MG tablet Take 1  tablet by mouth every 6 (six) hours as needed for moderate pain.    . Omega-3 Fatty Acids (FISH OIL PO) Take 1 tablet by mouth daily.    Marland Kitchen sulfamethoxazole-trimethoprim (BACTRIM DS,SEPTRA DS) 800-160 MG tablet Take 1 tablet by mouth 2 (two) times daily. 20 tablet 0  . traZODone (DESYREL) 150 MG tablet TAKE 1/2-1 TABLET BY MOUTH AT BEDTIME AS NEEDED 90 tablet 1   No current facility-administered medications on file prior to visit.    Allergies:  Allergies  Allergen Reactions  . Morphine Itching    Can take hydrocodone without issues    Health Maintenance:  Immunization History  Administered Date(s) Administered  . Influenza Split 10/05/2015  . Influenza,inj,Quad PF,36+ Mos 11/15/2013  . Influenza,inj,quad, With Preservative 09/26/2016  . Influenza-Unspecified 12/19/2009  . PPD Test 03/09/2015, 03/14/2016  . Pneumococcal Conjugate-13 11/15/2013  . Pneumococcal-Unspecified 12/20/1995  . Td 12/19/2004  . Tdap 03/09/2015   Tetanus: 2016 Pneumovax:1997 Prevnar 13: 2014 Flu vaccine:2017 Zostavax: DUE needs new one  DEXA: N/A Colonoscopy: 2007 and possibly done 2015, will call and check  EGD: N/A PFTs 2015 CT chest 2013 Echo 2017 Stress test 09/2016  Eye Exam: None Dentist: Dr. Jacquenette Shone  Patient Care Team: Unk Pinto, MD as PCP - General (Internal Medicine)  Medical History:  has COPD (chronic obstructive pulmonary disease) ([place]); Bicuspid aortic valve; Mixed hyperlipidemia; Borderline systolic HTN; ED (erectile dysfunction); DJD (degenerative joint disease); Prediabetes; Vitamin D deficiency; and Medication management on his problem list. Surgical History:  He  has a past surgical history that includes Colon surgery (Jan 2012); Tonsillectomy; Hemorroidectomy; Shoulder surgery; Colonoscopy (12/2013); and Upper gi endoscopy (2015). Family History:  His family history includes Benign prostatic hyperplasia in his father; Heart disease in his father. Social History:   reports that he quit smoking about 3 years ago. His smoking use included Cigarettes. He smoked 0.50 packs per day. He quit smokeless tobacco use about 10 years ago. His smokeless tobacco use included Chew. He reports that he drinks about 3.0 oz of alcohol per week . He reports that he does not use drugs.   Review of Systems:  Review of Systems  Constitutional: Negative.   HENT: Negative.   Eyes: Negative.   Respiratory: Negative.   Cardiovascular: Negative.   Gastrointestinal: Negative.   Genitourinary: Negative.   Musculoskeletal:  Negative.   Skin: Negative.     Physical Exam: Estimated body mass index is 29.71 kg/m as calculated from the following:   Height as of this encounter: 6\' 1"  (1.854 m).   Weight as of this encounter: 225 lb 3.2 oz (102.2 kg). BP 118/72   Pulse 72   Temp 97.7 F (36.5 C)   Resp 16   Ht 6\' 1"  (1.854 m)   Wt 225 lb 3.2 oz (102.2 kg)   SpO2 99%   BMI 29.71 kg/m  General Appearance: Well nourished, in no apparent distress.  Eyes: PERRLA, EOMs, conjunctiva no swelling or erythema, normal fundi and vessels.  Sinuses: No Frontal/maxillary tenderness  ENT/Mouth: Ext aud canals clear, normal light reflex with TMs without erythema, bulging. Good dentition. No erythema, swelling, or exudate on post pharynx. Tonsils not swollen or erythematous. Hearing normal.  Neck: Supple, thyroid normal. No bruits  Respiratory: Respiratory effort normal, BS equal bilaterally without rales, rhonchi, wheezing or stridor.  Cardio: RRR without murmurs, rubs or gallops. Brisk peripheral pulses without edema.  Chest: symmetric, with normal excursions and percussion.  Abdomen: Soft, nontender, no guarding, rebound, hernias, masses, or  organomegaly.  Lymphatics: Non tender without lymphadenopathy.  Genitourinary:  Musculoskeletal: Full ROM all peripheral extremities,5/5 strength, and normal gait.  Skin: Warm, dry without rashes, lesions, ecchymosis. Neuro: Cranial nerves intact, reflexes equal bilaterally. Normal muscle tone, no cerebellar symptoms. Sensation intact.  Psych: Awake and oriented X 3, normal affect, Insight and Judgment appropriate.   EKG: WNL no changes. AORTA SCAN: WNL  [name] 3:02 PM [facility]

## 2017-04-11 ENCOUNTER — Ambulatory Visit (INDEPENDENT_AMBULATORY_CARE_PROVIDER_SITE_OTHER): Payer: BLUE CROSS/BLUE SHIELD | Admitting: Physician Assistant

## 2017-04-11 ENCOUNTER — Encounter: Payer: Self-pay | Admitting: Physician Assistant

## 2017-04-11 VITALS — BP 118/72 | HR 72 | Temp 97.7°F | Resp 16 | Ht 73.0 in | Wt 225.2 lb

## 2017-04-11 DIAGNOSIS — E559 Vitamin D deficiency, unspecified: Secondary | ICD-10-CM | POA: Diagnosis not present

## 2017-04-11 DIAGNOSIS — J449 Chronic obstructive pulmonary disease, unspecified: Secondary | ICD-10-CM

## 2017-04-11 DIAGNOSIS — R7303 Prediabetes: Secondary | ICD-10-CM

## 2017-04-11 DIAGNOSIS — N529 Male erectile dysfunction, unspecified: Secondary | ICD-10-CM

## 2017-04-11 DIAGNOSIS — Q231 Congenital insufficiency of aortic valve: Secondary | ICD-10-CM

## 2017-04-11 DIAGNOSIS — R030 Elevated blood-pressure reading, without diagnosis of hypertension: Secondary | ICD-10-CM

## 2017-04-11 DIAGNOSIS — Z0000 Encounter for general adult medical examination without abnormal findings: Secondary | ICD-10-CM | POA: Diagnosis not present

## 2017-04-11 DIAGNOSIS — Z125 Encounter for screening for malignant neoplasm of prostate: Secondary | ICD-10-CM

## 2017-04-11 DIAGNOSIS — Z79899 Other long term (current) drug therapy: Secondary | ICD-10-CM | POA: Diagnosis not present

## 2017-04-11 DIAGNOSIS — E782 Mixed hyperlipidemia: Secondary | ICD-10-CM

## 2017-04-11 DIAGNOSIS — Z0001 Encounter for general adult medical examination with abnormal findings: Secondary | ICD-10-CM

## 2017-04-11 DIAGNOSIS — M1990 Unspecified osteoarthritis, unspecified site: Secondary | ICD-10-CM

## 2017-04-11 LAB — CBC WITH DIFFERENTIAL/PLATELET
Basophils Absolute: 64 cells/uL (ref 0–200)
Basophils Relative: 1 %
Eosinophils Absolute: 128 cells/uL (ref 15–500)
Eosinophils Relative: 2 %
HCT: 42.6 % (ref 38.5–50.0)
Hemoglobin: 14.3 g/dL (ref 13.2–17.1)
Lymphocytes Relative: 21 %
Lymphs Abs: 1344 cells/uL (ref 850–3900)
MCH: 31.4 pg (ref 27.0–33.0)
MCHC: 33.6 g/dL (ref 32.0–36.0)
MCV: 93.6 fL (ref 80.0–100.0)
MPV: 11.1 fL (ref 7.5–12.5)
Monocytes Absolute: 832 cells/uL (ref 200–950)
Monocytes Relative: 13 %
Neutro Abs: 4032 cells/uL (ref 1500–7800)
Neutrophils Relative %: 63 %
Platelets: 210 10*3/uL (ref 140–400)
RBC: 4.55 MIL/uL (ref 4.20–5.80)
RDW: 13.9 % (ref 11.0–15.0)
WBC: 6.4 10*3/uL (ref 3.8–10.8)

## 2017-04-11 LAB — PSA: PSA: 3.4 ng/mL (ref <=4.0)

## 2017-04-11 LAB — TSH: TSH: 2.45 mIU/L (ref 0.40–4.50)

## 2017-04-11 NOTE — Patient Instructions (Addendum)
Please call for Dr. Earlean Shawl office  Phone: [phone]

## 2017-04-12 ENCOUNTER — Other Ambulatory Visit: Payer: Self-pay | Admitting: Physician Assistant

## 2017-04-12 DIAGNOSIS — Z79899 Other long term (current) drug therapy: Secondary | ICD-10-CM

## 2017-04-12 LAB — MAGNESIUM: MAGNESIUM: 2 mg/dL (ref 1.5–2.5)

## 2017-04-12 LAB — HEMOGLOBIN A1C
Hgb A1c MFr Bld: 4.9 % (ref <5.7)
MEAN PLASMA GLUCOSE: 94 mg/dL

## 2017-04-12 LAB — BASIC METABOLIC PANEL WITH GFR
BUN: 17 mg/dL (ref 7–25)
CHLORIDE: 103 mmol/L (ref 98–110)
CO2: 22 mmol/L (ref 20–31)
Calcium: 9.8 mg/dL (ref 8.6–10.3)
Creat: 1.75 mg/dL — ABNORMAL HIGH (ref 0.70–1.25)
GFR, EST NON AFRICAN AMERICAN: 41 mL/min — AB (ref >=60)
GFR, Est African American: 47 mL/min — ABNORMAL LOW (ref >=60)
GLUCOSE: 87 mg/dL (ref 65–99)
POTASSIUM: 5.2 mmol/L (ref 3.5–5.3)
Sodium: 137 mmol/L (ref 135–146)

## 2017-04-12 LAB — MICROALBUMIN / CREATININE URINE RATIO
Creatinine, Urine: 141 mg/dL (ref 20–370)
MICROALB UR: 0.3 mg/dL
MICROALB/CREAT RATIO: 2 ug/mg{creat} (ref <30)

## 2017-04-12 LAB — HEPATIC FUNCTION PANEL
ALBUMIN: 4.3 g/dL (ref 3.6–5.1)
ALT: 15 U/L (ref 9–46)
AST: 17 U/L (ref 10–35)
Alkaline Phosphatase: 55 U/L (ref 40–115)
BILIRUBIN INDIRECT: 0.3 mg/dL (ref 0.2–1.2)
Bilirubin, Direct: 0.1 mg/dL (ref <=0.2)
TOTAL PROTEIN: 6.5 g/dL (ref 6.1–8.1)
Total Bilirubin: 0.4 mg/dL (ref 0.2–1.2)

## 2017-04-12 LAB — URINALYSIS, ROUTINE W REFLEX MICROSCOPIC
Bilirubin Urine: NEGATIVE
Glucose, UA: NEGATIVE
HGB URINE DIPSTICK: NEGATIVE
KETONES UR: NEGATIVE
Leukocytes, UA: NEGATIVE
NITRITE: NEGATIVE
PH: 5.5 (ref 5.0–8.0)
Protein, ur: NEGATIVE
Specific Gravity, Urine: 1.018 (ref 1.001–1.035)

## 2017-04-12 LAB — LIPID PANEL
Cholesterol: 185 mg/dL (ref <200)
HDL: 36 mg/dL — ABNORMAL LOW (ref >40)
LDL Cholesterol: 116 mg/dL — ABNORMAL HIGH (ref <100)
TRIGLYCERIDES: 165 mg/dL — AB (ref <150)
Total CHOL/HDL Ratio: 5.1 Ratio — ABNORMAL HIGH (ref <5.0)
VLDL: 33 mg/dL — AB (ref <30)

## 2017-04-12 LAB — VITAMIN D 25 HYDROXY (VIT D DEFICIENCY, FRACTURES): Vit D, 25-Hydroxy: 66 ng/mL (ref 30–100)

## 2017-05-12 ENCOUNTER — Other Ambulatory Visit: Payer: BLUE CROSS/BLUE SHIELD

## 2017-05-12 DIAGNOSIS — Z79899 Other long term (current) drug therapy: Secondary | ICD-10-CM

## 2017-05-13 LAB — BASIC METABOLIC PANEL WITH GFR
BUN: 14 mg/dL (ref 7–25)
CO2: 23 mmol/L (ref 20–31)
CREATININE: 1.18 mg/dL (ref 0.70–1.25)
Calcium: 9.2 mg/dL (ref 8.6–10.3)
Chloride: 103 mmol/L (ref 98–110)
GFR, Est African American: 76 mL/min (ref >=60)
GFR, Est Non African American: 66 mL/min (ref >=60)
Glucose, Bld: 110 mg/dL — ABNORMAL HIGH (ref 65–99)
Potassium: 4.5 mmol/L (ref 3.5–5.3)
Sodium: 136 mmol/L (ref 135–146)

## 2017-08-19 ENCOUNTER — Other Ambulatory Visit: Payer: Self-pay | Admitting: Internal Medicine

## 2017-09-09 ENCOUNTER — Other Ambulatory Visit: Payer: Self-pay | Admitting: Physician Assistant

## 2017-10-11 ENCOUNTER — Telehealth: Payer: Self-pay

## 2017-10-11 DIAGNOSIS — Q2381 Bicuspid aortic valve: Secondary | ICD-10-CM

## 2017-10-11 DIAGNOSIS — Q231 Congenital insufficiency of aortic valve: Secondary | ICD-10-CM

## 2017-10-11 NOTE — Telephone Encounter (Signed)
-----   Message from Michaelyn Barter, RN sent at 10/11/2016  5:15 PM EDT ----- Will need echo f/u in 1 year from 10/11/16

## 2017-10-11 NOTE — Telephone Encounter (Signed)
Left message for patient to call back. Patient has an appt on 10/20/17. Patient needs an echo before his appt. Patient needs to call back to confirm appt time.

## 2017-10-17 NOTE — Progress Notes (Signed)
Patient ID: Anthony Paul, male   DOB: 1956-12-17, 61 y.o.   MRN: [id number]   61 y.o. referred by Dr Melford Aase for murmur in 2014 Diagnosed with bicuspid AV  .  Patient has mild exertional dyspnea. Some muscular sounding chest pain. No palpitations or syncope. Sees dentist twice/yearr. Previous smoker. CRF elevated BP and cholesterol MRI with no coarctation and ascending aorta 3.9 cm  Doing well still driving for [employer]  More issues with chronic stomach pain   Echo 9/14 with mild AS and moderate AR gradients 16/27 mmHg Echo 8/15 normal EF gradients 12/25 mmHg mild AR  Echo 09/21/15 normal EF mild  AS mean gradient 13 mmHg peak 27 mmHg moderate AR Echo 10/10/16: normal EF mild AS mean gradient 16 mmHg peak 28 mmHg moderate AR Echo 10/20/17 normal EF mild /mod AS mean gradient 18 mmHg peak 36 mmHg moderate AR   Retired now [employer] 35th anniversary and retirement with cruise in [place] some gum  Work at dentist soon No cardiac symptoms    ROS: Denies fever, malais, weight loss, blurry vision, decreased visual acuity, cough, sputum, SOB, hemoptysis, pleuritic pain, palpitaitons, heartburn, abdominal pain, melena, lower extremity edema, claudication, or rash.  All other systems reviewed and negative  General: Affect appropriate Healthy:  appears stated age 61: normal Neck supple with no adenopathy JVP normal no bruits no thyromegaly Lungs clear with no wheezing and good diaphragmatic motion Heart:  S1/S2 AS/AR  murmur, no rub, gallop or click PMI normal Abdomen: benighn, BS positve, no tenderness, no AAA no bruit.  No HSM or HJR Distal pulses intact with no bruits No edema Neuro non-focal Skin warm and dry No muscular weakness    Current Outpatient Prescriptions  Medication Sig Dispense Refill  . acetaminophen (TYLENOL) 500 MG tablet Take 1,500 mg by mouth every 6 (six) hours as needed for mild pain.    Marland Kitchen aspirin 81 MG tablet Take 81 mg by mouth daily.     . Cholecalciferol  (VITAMIN D) 2000 UNITS CAPS Take 1 capsule by mouth daily.     Marland Kitchen EVZIO 2 MG/0.4ML SOAJ INJECT ONE 0.4ML INJECTION INTO OUTER THIGH AS NEEDED FOR SUSPECTED OPIOID OVERDOSE. CALL 911.  0  . fenofibrate micronized (LOFIBRA) 134 MG capsule TAKE ONE CAPSULE BY MOUTH EVERY DAY BEFORE BREAKFAST. 90 capsule 1  . Flaxseed, Linseed, (FLAXSEED OIL) 1200 MG CAPS Take 1 capsule by mouth daily.    Marland Kitchen HYDROcodone-acetaminophen (NORCO) 10-325 MG tablet Take 1 tablet by mouth 2 (two) times daily as needed.  0  . Omega-3 Fatty Acids (FISH OIL PO) Take 1 tablet by mouth daily.    . traZODone (DESYREL) 150 MG tablet TAKE 1/2-1 TABLET BY MOUTH AT BEDTIME AS NEEDED 90 tablet 1   No current facility-administered medications for this visit.     Allergies  Morphine  Electrocardiogram: 5/15  SR rate 78 normal 10/20/17 SR rate 69 normal   Assessment and Plan  Bicuspid Aortic Valve:  Mild to moderate  stenosis and moderate AR  F/u echo in a year  GI:  Seeing pain clinic CT with no pathology low risk for SMA disease  F/u GI Elevated Triglycerides:  Continue fenobibrate  Labs with primary  CAD:  Normal ETT 2013 and 10/10/16 follow given valve disease   Jenkins Rouge

## 2017-10-20 ENCOUNTER — Ambulatory Visit (HOSPITAL_COMMUNITY): Payer: BLUE CROSS/BLUE SHIELD | Attending: Cardiology

## 2017-10-20 ENCOUNTER — Other Ambulatory Visit: Payer: Self-pay

## 2017-10-20 ENCOUNTER — Encounter: Payer: Self-pay | Admitting: Cardiovascular Disease

## 2017-10-20 ENCOUNTER — Ambulatory Visit (INDEPENDENT_AMBULATORY_CARE_PROVIDER_SITE_OTHER): Payer: BLUE CROSS/BLUE SHIELD | Admitting: Cardiovascular Disease

## 2017-10-20 VITALS — BP 124/74 | HR 69 | Ht 73.0 in | Wt 234.0 lb

## 2017-10-20 DIAGNOSIS — J449 Chronic obstructive pulmonary disease, unspecified: Secondary | ICD-10-CM | POA: Diagnosis not present

## 2017-10-20 DIAGNOSIS — Z87891 Personal history of nicotine dependence: Secondary | ICD-10-CM | POA: Diagnosis not present

## 2017-10-20 DIAGNOSIS — I080 Rheumatic disorders of both mitral and aortic valves: Secondary | ICD-10-CM | POA: Insufficient documentation

## 2017-10-20 DIAGNOSIS — E785 Hyperlipidemia, unspecified: Secondary | ICD-10-CM | POA: Diagnosis not present

## 2017-10-20 DIAGNOSIS — Z8249 Family history of ischemic heart disease and other diseases of the circulatory system: Secondary | ICD-10-CM | POA: Diagnosis not present

## 2017-10-20 DIAGNOSIS — Q231 Congenital insufficiency of aortic valve: Secondary | ICD-10-CM | POA: Diagnosis not present

## 2017-10-20 NOTE — Patient Instructions (Addendum)

## 2017-10-24 ENCOUNTER — Encounter: Payer: Self-pay | Admitting: Physician Assistant

## 2017-10-24 ENCOUNTER — Ambulatory Visit: Payer: BLUE CROSS/BLUE SHIELD | Admitting: Physician Assistant

## 2017-10-24 VITALS — BP 122/80 | HR 75 | Temp 97.5°F | Resp 16 | Ht 73.0 in | Wt 231.6 lb

## 2017-10-24 DIAGNOSIS — Z79899 Other long term (current) drug therapy: Secondary | ICD-10-CM | POA: Diagnosis not present

## 2017-10-24 DIAGNOSIS — Z23 Encounter for immunization: Secondary | ICD-10-CM

## 2017-10-24 DIAGNOSIS — Q231 Congenital insufficiency of aortic valve: Secondary | ICD-10-CM | POA: Diagnosis not present

## 2017-10-24 DIAGNOSIS — E782 Mixed hyperlipidemia: Secondary | ICD-10-CM

## 2017-10-24 DIAGNOSIS — R030 Elevated blood-pressure reading, without diagnosis of hypertension: Secondary | ICD-10-CM

## 2017-10-24 DIAGNOSIS — J449 Chronic obstructive pulmonary disease, unspecified: Secondary | ICD-10-CM | POA: Diagnosis not present

## 2017-10-24 DIAGNOSIS — R1031 Right lower quadrant pain: Secondary | ICD-10-CM

## 2017-10-24 MED ORDER — DULOXETINE HCL 30 MG PO CPEP: 30.0000 mg | ORAL_CAPSULE | Freq: Every day | ORAL | 2 refills | Status: DC

## 2017-10-24 NOTE — Patient Instructions (Signed)
Add on Cymbalta 30mg  once daily to see if this helps with pain at all Try x 1 month, if no help can stop  Try to start working out daily Maybe take cooking classes and try to learn to cook/eat healthier.   Here is some information to help you keep your heart healthy: Move it! - Aim for 30 mins of activity every day. Take it slowly at first. Talk to [place] before starting any new exercise program.   Lose it.  -Body Mass Index (BMI) can indicate if you need to lose weight. A healthy range is 18.5-24.9. For a BMI calculator, go to [employer]  Waist Management -Excess abdominal fat is a risk factor for heart disease, diabetes, asthma, stroke and more. Ideal waist circumference is less than 35" for women and less than 40" for men.   Eat Right -focus on fruits, vegetables, whole grains, and meals you make yourself. Avoid foods with trans fat and high sugar/sodium content.   Snooze or Snore? - Loud snoring can be a sign of sleep apnea, a significant risk factor for high blood pressure, heart attach, stroke, and heart arrhythmias.  Kick the habit -Quit Smoking! Avoid second hand smoke. A single cigarette raises your blood pressure for 20 mins and increases the risk of heart attack and stroke for the next 24 hours.   Are Aspirin and Supplements right for you? -Add ENTERIC COATED low dose 81 mg Aspirin daily OR can do every other day if you have easy bruising to protect your heart and head. As well as to reduce risk of Colon Cancer by 20 %, Skin Cancer by 26 % , Melanoma by 46% and Pancreatic cancer by 60%  Say "No to Stress -There may be little you can do about problems that cause stress. However, techniques such as long walks, meditation, and exercise can help you manage it.   Start Now! - Make changes one at a time and set reasonable goals to increase your likelihood of success.

## 2017-10-24 NOTE — Progress Notes (Signed)
Assessment and Plan:   Bicuspid aortic valve Continue follow up cardio  Borderline systolic HTN - continue medications, DASH diet, exercise and monitor at home. Call if greater than 130/80.  -     CBC with Differential/Platelet -     BASIC METABOLIC PANEL WITH GFR -     Hepatic function panel -     TSH  Chronic obstructive pulmonary disease, unspecified COPD type ([place]) No triggers, well controlled symptoms, cont to monitor  Mixed hyperlipidemia -continue medications, check lipids, decrease fatty foods, increase activity.  -     Lipid panel  Medication management  Right lower quadrant abdominal pain -     DULoxetine (CYMBALTA) 30 MG capsule; Take 1 capsule (30 mg total) daily by mouth. - try to cut back on hydrocodone.     Continue diet and meds as discussed. Further disposition pending results of labs. Future Appointments  Date Time Provider [place]  10/22/2018  9:30 AM [facility]    HPI 61 y.o. male  presents for 3 month follow up with hypertension, hyperlipidemia, prediabetes and vitamin D.  His blood pressure has been controlled at home, today their BP is BP: 122/80.   He does not workout he is now retired.  He denies chest pain, shortness of breath, dizziness. Has bicuspid valve, follows with Cardio, sees pain management for rib pain, has tried lyrica/gabpentin without help.    He is on cholesterol medication and denies myalgias. His cholesterol is at goal. The cholesterol last visit was:   Lab Results  Component Value Date   CHOL 185 04/11/2017   HDL 36 (L) 04/11/2017   LDLCALC 116 (H) 04/11/2017   TRIG 165 (H) 04/11/2017   CHOLHDL 5.1 (H) 04/11/2017    He has been working on diet and exercise for prediabetes, and denies foot ulcerations, hyperglycemia, hypoglycemia , increased appetite, nausea, paresthesia of the feet, polydipsia, polyuria, visual disturbances, vomiting and weight loss. Last A1C in the office was:  Lab  Results  Component Value Date   HGBA1C 4.9 04/11/2017   Patient is on Vitamin D supplement.  Lab Results  Component Value Date   VD25OH 66 04/11/2017   BMI is Body mass index is 30.56 kg/m., he is working on diet and exercise. Wt Readings from Last 3 Encounters:  10/24/17 231 lb 9.6 oz (105.1 kg)  10/20/17 234 lb (106.1 kg)  04/11/17 225 lb 3.2 oz (102.2 kg)    Current Medications:  Current Outpatient Medications on File Prior to Visit  Medication Sig Dispense Refill  . acetaminophen (TYLENOL) 500 MG tablet Take 1,500 mg by mouth every 6 (six) hours as needed for mild pain.    Marland Kitchen aspirin 81 MG tablet Take 81 mg by mouth daily.     . Cholecalciferol (VITAMIN D) 2000 UNITS CAPS Take 1 capsule by mouth daily.     Marland Kitchen EVZIO 2 MG/0.4ML SOAJ INJECT ONE 0.4ML INJECTION INTO OUTER THIGH AS NEEDED FOR SUSPECTED OPIOID OVERDOSE. CALL 911.  0  . fenofibrate micronized (LOFIBRA) 134 MG capsule TAKE ONE CAPSULE BY MOUTH EVERY DAY BEFORE BREAKFAST. 90 capsule 1  . Flaxseed, Linseed, (FLAXSEED OIL) 1200 MG CAPS Take 1 capsule by mouth daily.    Marland Kitchen HYDROcodone-acetaminophen (NORCO) 10-325 MG tablet Take 1 tablet by mouth 2 (two) times daily as needed.  0  . Omega-3 Fatty Acids (FISH OIL PO) Take 1 tablet by mouth daily.    . traZODone (DESYREL) 150 MG tablet TAKE 1/2-1 TABLET  BY MOUTH AT BEDTIME AS NEEDED 90 tablet 1   No current facility-administered medications on file prior to visit.     Medical History:  Past Medical History:  Diagnosis Date  . Abnormal CT scan, chest    Blebs on CT chest but NORMAL PFTs- no COPD  . Aortic stenosis   . Bicuspid aortic valve    Echo 2014  EF 55-60% Mild to moderate AR mild AS with mean gradient 18 mmHg and peak of 25 mmHg. ? Bicuspid valve with dilated aortic root 4.0 cm.  . Diverticulitis   . DJD (degenerative joint disease)   . ED (erectile dysfunction)   . Elevated blood pressure reading without diagnosis of hypertension   . Hypercholesteremia   .  Skin cancer     Allergies:  Allergies  Allergen Reactions  . Morphine Itching    Can take hydrocodone without issues     Review of Systems:  Review of Systems  Constitutional: Negative for chills, fever and malaise/fatigue.  HENT: Positive for congestion and ear pain. Negative for sore throat.   Eyes: Negative.   Respiratory: Negative for cough, hemoptysis and shortness of breath.   Cardiovascular: Negative for chest pain, palpitations and leg swelling.  Gastrointestinal: Negative for abdominal pain, blood in stool, constipation, diarrhea, heartburn and melena.  Genitourinary: Negative.   Skin: Negative.   Neurological: Negative for dizziness, sensory change, loss of consciousness and headaches.  Psychiatric/Behavioral: Negative for depression. The patient is not nervous/anxious and does not have insomnia.     Family history- Review and unchanged  Social history- Review and unchanged  Physical Exam: BP 122/80   Pulse 75   Temp (!) 97.5 F (36.4 C)   Resp 16   Ht 6\' 1"  (1.854 m)   Wt 231 lb 9.6 oz (105.1 kg)   SpO2 97%   BMI 30.56 kg/m  Wt Readings from Last 3 Encounters:  10/24/17 231 lb 9.6 oz (105.1 kg)  10/20/17 234 lb (106.1 kg)  04/11/17 225 lb 3.2 oz (102.2 kg)    General Appearance: Well nourished well developed, in no apparent distress. Eyes: PERRLA, EOMs, conjunctiva no swelling or erythema ENT/Mouth: Ear canals normal without obstruction, swelling, erythma, discharge.  TMs normal bilaterally.  Oropharynx moist, clear, without exudate, or postoropharyngeal swelling. Neck: Supple, thyroid normal,no cervical adenopathy  Respiratory: Respiratory effort normal, Breath sounds clear A&P without rhonchi, wheeze, or rale.  No retractions, no accessory usage. Cardio: RRR with no MRGs. Brisk peripheral pulses without edema.  Abdomen: Soft, + BS,  Non tender, no guarding, rebound, hernias, masses. Musculoskeletal: Full ROM, 5/5 strength, Normal gait Skin: Warm, dry  without rashes, lesions, ecchymosis.  Neuro: Awake and oriented X 3, Cranial nerves intact. Normal muscle tone, no cerebellar symptoms. Psych: Normal affect, Insight and Judgment appropriate.    [name], PA-C 9:39 AM [facility]

## 2017-10-25 LAB — CBC WITH DIFFERENTIAL/PLATELET
BASOS PCT: 0.4 %
Basophils Absolute: 27 cells/uL (ref 0–200)
Eosinophils Absolute: 211 cells/uL (ref 15–500)
Eosinophils Relative: 3.1 %
HEMATOCRIT: 42.4 % (ref 38.5–50.0)
Hemoglobin: 15.1 g/dL (ref 13.2–17.1)
LYMPHS ABS: 1673 {cells}/uL (ref 850–3900)
MCH: 32.2 pg (ref 27.0–33.0)
MCHC: 35.6 g/dL (ref 32.0–36.0)
MCV: 90.4 fL (ref 80.0–100.0)
MPV: 11.4 fL (ref 7.5–12.5)
Monocytes Relative: 11.6 %
Neutro Abs: 4100 cells/uL (ref 1500–7800)
Neutrophils Relative %: 60.3 %
PLATELETS: 207 10*3/uL (ref 140–400)
RBC: 4.69 10*6/uL (ref 4.20–5.80)
RDW: 12.5 % (ref 11.0–15.0)
Total Lymphocyte: 24.6 %
WBC: 6.8 10*3/uL (ref 3.8–10.8)
WBCMIX: 789 {cells}/uL (ref 200–950)

## 2017-10-25 LAB — LIPID PANEL
Cholesterol: 188 mg/dL (ref <200)
HDL: 26 mg/dL — ABNORMAL LOW (ref >40)
LDL CHOLESTEROL (CALC): 117 mg/dL — AB
NON-HDL CHOLESTEROL (CALC): 162 mg/dL — AB (ref <130)
TRIGLYCERIDES: 291 mg/dL — AB (ref <150)
Total CHOL/HDL Ratio: 7.2 (calc) — ABNORMAL HIGH (ref <5.0)

## 2017-10-25 LAB — HEPATIC FUNCTION PANEL
AG RATIO: 1.7 (calc) (ref 1.0–2.5)
ALBUMIN MSPROF: 4 g/dL (ref 3.6–5.1)
ALT: 30 U/L (ref 9–46)
AST: 17 U/L (ref 10–35)
Alkaline phosphatase (APISO): 60 U/L (ref 40–115)
BILIRUBIN DIRECT: 0.1 mg/dL (ref 0.0–0.2)
BILIRUBIN TOTAL: 0.3 mg/dL (ref 0.2–1.2)
Globulin: 2.3 g/dL (calc) (ref 1.9–3.7)
Indirect Bilirubin: 0.2 mg/dL (calc) (ref 0.2–1.2)
Total Protein: 6.3 g/dL (ref 6.1–8.1)

## 2017-10-25 LAB — BASIC METABOLIC PANEL WITH GFR
BUN/Creatinine Ratio: 10 (calc) (ref 6–22)
BUN: 15 mg/dL (ref 7–25)
CALCIUM: 9.6 mg/dL (ref 8.6–10.3)
CHLORIDE: 105 mmol/L (ref 98–110)
CO2: 26 mmol/L (ref 20–32)
Creat: 1.43 mg/dL — ABNORMAL HIGH (ref 0.70–1.25)
GFR, EST AFRICAN AMERICAN: 60 mL/min/{1.73_m2} (ref >=60)
GFR, EST NON AFRICAN AMERICAN: 52 mL/min/{1.73_m2} — AB (ref >=60)
Glucose, Bld: 105 mg/dL — ABNORMAL HIGH (ref 65–99)
Potassium: 4.6 mmol/L (ref 3.5–5.3)
Sodium: 137 mmol/L (ref 135–146)

## 2017-10-25 LAB — TSH: TSH: 2.86 m[IU]/L (ref 0.40–4.50)

## 2017-11-20 ENCOUNTER — Other Ambulatory Visit: Payer: Self-pay

## 2017-11-20 DIAGNOSIS — R1031 Right lower quadrant pain: Secondary | ICD-10-CM

## 2017-11-20 MED ORDER — DULOXETINE HCL 30 MG PO CPEP: 30.0000 mg | ORAL_CAPSULE | Freq: Every day | ORAL | 0 refills | Status: DC

## 2018-02-13 ENCOUNTER — Other Ambulatory Visit: Payer: Self-pay | Admitting: Internal Medicine

## 2018-02-23 ENCOUNTER — Other Ambulatory Visit: Payer: Self-pay | Admitting: Physician Assistant

## 2018-02-23 DIAGNOSIS — R1031 Right lower quadrant pain: Secondary | ICD-10-CM

## 2018-03-06 ENCOUNTER — Other Ambulatory Visit: Payer: Self-pay | Admitting: Internal Medicine

## 2018-03-06 ENCOUNTER — Ambulatory Visit: Payer: BLUE CROSS/BLUE SHIELD | Admitting: Adult Health

## 2018-03-06 ENCOUNTER — Encounter: Payer: Self-pay | Admitting: Adult Health

## 2018-03-06 VITALS — BP 110/76 | HR 80 | Temp 97.5°F | Ht 73.0 in | Wt 237.0 lb

## 2018-03-06 DIAGNOSIS — Z1211 Encounter for screening for malignant neoplasm of colon: Secondary | ICD-10-CM | POA: Diagnosis not present

## 2018-03-06 DIAGNOSIS — R1011 Right upper quadrant pain: Secondary | ICD-10-CM

## 2018-03-06 MED ORDER — DICYCLOMINE HCL 10 MG PO CAPS: 10.0000 mg | ORAL_CAPSULE | Freq: Four times a day (QID) | ORAL | 0 refills | Status: DC

## 2018-03-06 NOTE — Progress Notes (Signed)
Assessment and Plan:  Anthony Paul was seen today for flank pain.  Diagnoses and all orders for this visit:  Continuous RUQ abdominal pain Ongoing pain; will refer back to GI to discuss need for ?overdue colonoscopy or other, will rule out infectious or other new acute process -     US Abdomen Complete; Future -     CBC with Differential/Platelet -     BASIC METABOLIC PANEL WITH GFR -     Hepatic function panel -     Urinalysis w microscopic + reflex cultur  Screening for colon cancer -     Ambulatory referral to Gastroenterology  Other orders -     dicyclomine (BENTYL) 10 MG capsule; Take 1 capsule (10 mg total) by mouth 4 (four) times daily for 14 days.  Further disposition pending results of labs. Discussed med's effects and SE's.   Over 15 minutes of exam, counseling, chart review, and critical decision making was performed.   Future Appointments  Date Time Provider [place]  04/30/2018  9:00 AM [name], PA-C [id number] None  10/22/2018  9:30 AM [facility]    ------------------------------------------------------------------------------------------------------------------   HPI BP 110/76   Pulse 80   Temp (!) 97.5 F (36.4 C)   Ht 6\' 1"  (1.854 m)   Wt 237 lb (107.5 kg)   SpO2 96%   BMI 31.27 kg/m   62 y.o.male presents for RUQ abdominal/under rib cage; he reports a sharp, constant, localized pain - non-radiating, 7/10 today, no alleviating/aggravating factors. Pain is limiting his ability to sleep.  he reports this began to become worse 1-2 weeks ago, but is ongoing for several years. He sees pain management and is prescribed norco for this issue, but cannot recall what active diagnosis for this is. Possibly a "nerve issue" - also prescribed cymbalta for this. He denies nausea/vomiting, fever, chills, weight loss, fatigue/malaise, dyspnea, chest pain, palpitations, hematochezia, melena, recent constipation, acid reflux.   He also  endorses intermittent diarrhea  + hx of partial colon resection post diverticulitis in 2012, Left inguinal hernia repair with mesh, last visible report for colonoscopy in 2007, ?also had in 2015 but unclear and patient cannot recall. No recent imaging within last 5 years to review.   Past Medical History:  Diagnosis Date  . Abnormal CT scan, chest    Blebs on CT chest but NORMAL PFTs- no COPD  . Aortic stenosis   . Bicuspid aortic valve    Echo 2014  EF 55-60% Mild to moderate AR mild AS with mean gradient 18 mmHg and peak of 25 mmHg. ? Bicuspid valve with dilated aortic root 4.0 cm.  . Diverticulitis   . DJD (degenerative joint disease)   . ED (erectile dysfunction)   . Elevated blood pressure reading without diagnosis of hypertension   . Hypercholesteremia   . Skin cancer      Allergies  Allergen Reactions  . Morphine Itching    Can take hydrocodone without issues    Current Outpatient Medications on File Prior to Visit  Medication Sig  . acetaminophen (TYLENOL) 500 MG tablet Take 1,500 mg by mouth every 6 (six) hours as needed for mild pain.  Marland Kitchen aspirin 81 MG tablet Take 81 mg by mouth daily.   . Cholecalciferol (VITAMIN D) 2000 UNITS CAPS Take 1 capsule by mouth daily.   Marland Kitchen EVZIO 2 MG/0.4ML SOAJ INJECT ONE 0.4ML INJECTION INTO OUTER THIGH AS NEEDED FOR SUSPECTED OPIOID OVERDOSE. CALL 911.  . fenofibrate micronized (  LOFIBRA) 134 MG capsule TAKE ONE CAPSULE BY MOUTH EVERY DAY BEFORE BREAKFAST.  Marland Kitchen Flaxseed, Linseed, (FLAXSEED OIL) 1200 MG CAPS Take 1 capsule by mouth daily.  Marland Kitchen HYDROcodone-acetaminophen (NORCO) 10-325 MG tablet Take 1 tablet by mouth 2 (two) times daily as needed.  . Omega-3 Fatty Acids (FISH OIL PO) Take 1 tablet by mouth daily.  . traZODone (DESYREL) 150 MG tablet TAKE 1/2-1 TABLET BY MOUTH AT BEDTIME AS NEEDED  . DULoxetine (CYMBALTA) 30 MG capsule TAKE 1 CAPSULE BY MOUTH EVERY DAY (Patient not taking: Reported on 03/06/2018)   No current facility-administered  medications on file prior to visit.     ROS: Review of Systems  Constitutional: Negative for chills, diaphoresis, fever, malaise/fatigue and weight loss.  HENT: Negative for hearing loss and tinnitus.   Eyes: Negative for blurred vision and double vision.  Respiratory: Negative for cough, shortness of breath and wheezing.   Cardiovascular: Negative for chest pain, palpitations, orthopnea, claudication and leg swelling.  Gastrointestinal: Positive for abdominal pain and diarrhea. Negative for blood in stool, constipation, heartburn, melena, nausea and vomiting.  Genitourinary: Negative.   Musculoskeletal: Negative for joint pain and myalgias.  Skin: Negative for rash.  Neurological: Negative for dizziness, tingling, sensory change, weakness and headaches.  Endo/Heme/Allergies: Negative for polydipsia.  Psychiatric/Behavioral: Negative.   All other systems reviewed and are negative.    Physical Exam:  BP 110/76   Pulse 80   Temp (!) 97.5 F (36.4 C)   Ht 6\' 1"  (1.854 m)   Wt 237 lb (107.5 kg)   SpO2 96%   BMI 31.27 kg/m   General Appearance: Well nourished, in no apparent distress. Eyes: PERRLA, EOMs, conjunctiva no swelling or erythema Sinuses: No Frontal/maxillary tenderness ENT/Mouth: Ext aud canals clear, TMs without erythema, bulging. No erythema, swelling, or exudate on post pharynx.  Tonsils not swollen or erythematous. Hearing normal.  Neck: Supple, thyroid normal.  Respiratory: Respiratory effort normal, BS equal bilaterally without rales, rhonchi, wheezing or stridor.  Cardio: RRR with no MRGs. Brisk peripheral pulses without edema.  Abdomen: Soft, + BS.  Tenderness to palpation of RUQ and LLQ, some guarding with LLQ deep palpation, rebound, hernias, masses. Murphey's negative.  Lymphatics: Non tender without lymphadenopathy.  Musculoskeletal: Full ROM, 5/5 strength, normal gait.  Skin: Warm, dry without rashes, lesions, ecchymosis.  Psych: Awake and oriented X 3,  normal affect, Insight and Judgment appropriate.     Izora Ribas, NP 3:12 PM [facility]

## 2018-03-06 NOTE — Patient Instructions (Signed)
Dicyclomine tablets or capsules What is this medicine? DICYCLOMINE (dye SYE kloe meen) is used to treat bowel problems including irritable bowel syndrome. This medicine may be used for other purposes; ask your health care provider or pharmacist if you have questions. COMMON BRAND NAME(S): Bentyl What should I tell my health care provider before I take this medicine? They need to know if you have any of these conditions: -difficulty passing urine -esophagus problems or heartburn -glaucoma -heart disease, or previous heart attack -myasthenia gravis -prostate trouble -stomach infection, or obstruction -ulcerative colitis -an unusual or allergic reaction to dicyclomine, other medicines, foods, dyes, or preservatives -pregnant or trying to get pregnant -breast-feeding How should I use this medicine? Take this medicine by mouth with a glass of water. Follow the directions on the prescription label. It is best to take this medicine on an empty stomach, 30 minutes to 1 hour before meals. Take your medicine at regular intervals. Do not take your medicine more often than directed. Talk to your pediatrician regarding the use of this medicine in children. Special care may be needed. While this drug may be prescribed for children as young as 10 months of age for selected conditions, precautions do apply. Patients over 50 years old may have a stronger reaction and need a smaller dose. Overdosage: If you think you have taken too much of this medicine contact a poison control center or emergency room at once. NOTE: This medicine is only for you. Do not share this medicine with others. What if I miss a dose? If you miss a dose, take it as soon as you can. If it is almost time for your next dose, take only that dose. Do not take double or extra doses. What may interact with this medicine? -amantadine -antacids -benztropine -digoxin -disopyramide -medicines for allergies, colds and breathing  difficulties -medicines for alzheimer's disease -medicines for anxiety or sleeping problems -medicines for depression or psychotic disturbances -medicines for diarrhea -medicines for pain -metoclopramide -tegaserod This list may not describe all possible interactions. Give your health care provider a list of all the medicines, herbs, non-prescription drugs, or dietary supplements you use. Also tell them if you smoke, drink alcohol, or use illegal drugs. Some items may interact with your medicine. What should I watch for while using this medicine? You may get drowsy, dizzy, or have blurred vision. Do not drive, use machinery, or do anything that needs mental alertness until you know how this medicine affects you. To reduce the risk of dizzy or fainting spells, do not sit or stand up quickly, especially if you are an older patient. Alcohol can make you more drowsy, avoid alcoholic drinks. Stay out of bright light and wear sunglasses if this medicine makes your eyes more sensitive to light. Avoid extreme heat (hot tubs, saunas). This medicine can cause you to sweat less than normal. Your body temperature could increase to dangerous levels, which may lead to heat stroke. Antacids can stop this medicine from working. If you get an upset stomach and want to take an antacid, make sure there is an interval of at least 1 to 2 hours before or after you take this medicine. Your mouth may get dry. Chewing sugarless gum or sucking hard candy, and drinking plenty of water may help. Contact your doctor if the problem does not go away or is severe. What side effects may I notice from receiving this medicine? Side effects that you should report to your doctor or health care professional as soon  as possible: -agitation, nervousness, confusion -difficulty swallowing -dizziness, drowsiness -fast or slow heartbeat -hallucinations -pain or difficulty passing urine Side effects that usually do not require medical  attention (report to your doctor or health care professional if they continue or are bothersome): -constipation -headache -nausea or vomiting -sexual difficulty This list may not describe all possible side effects. Call your doctor for medical advice about side effects. You may report side effects to FDA at 1-800-FDA-1088. Where should I keep my medicine? Keep out of the reach of children. Store at room temperature below 30 degrees C (86 degrees F). Protect from light. Throw away any unused medicine after the expiration date. NOTE: This sheet is a summary. It may not cover all possible information. If you have questions about this medicine, talk to your doctor, pharmacist, or health care provider.  2018 Elsevier/Gold Standard (2008-03-25 17:12:34)     Abdominal Pain, Adult Abdominal pain can be caused by many things. Often, abdominal pain is not serious and it gets better with no treatment or by being treated at home. However, sometimes abdominal pain is serious. Your health care provider will do a medical history and a physical exam to try to determine the cause of your abdominal pain. Follow these instructions at home:  Take over-the-counter and prescription medicines only as told by your health care provider. Do not take a laxative unless told by your health care provider.  Drink enough fluid to keep your urine clear or pale yellow.  Watch your condition for any changes.  Keep all follow-up visits as told by your health care provider. This is important. Contact a health care provider if:  Your abdominal pain changes or gets worse.  You are not hungry or you lose weight without trying.  You are constipated or have diarrhea for more than 2-3 days.  You have pain when you urinate or have a bowel movement.  Your abdominal pain wakes you up at night.  Your pain gets worse with meals, after eating, or with certain foods.  You are throwing up and cannot keep anything down.  You  have a fever. Get help right away if:  Your pain does not go away as soon as your health care provider told you to expect.  You cannot stop throwing up.  Your pain is only in areas of the abdomen, such as the right side or the left lower portion of the abdomen.  You have bloody or black stools, or stools that look like tar.  You have severe pain, cramping, or bloating in your abdomen.  You have signs of dehydration, such as: ? Dark urine, very little urine, or no urine. ? Cracked lips. ? Dry mouth. ? Sunken eyes. ? Sleepiness. ? Weakness. This information is not intended to replace advice given to you by your health care provider. Make sure you discuss any questions you have with your health care provider. Document Released: 09/14/2005 Document Revised: 06/24/2016 Document Reviewed: 05/18/2016 Elsevier Interactive Patient Education  [employer].

## 2018-03-07 ENCOUNTER — Encounter: Payer: Self-pay | Admitting: Physician Assistant

## 2018-03-07 ENCOUNTER — Encounter: Payer: Self-pay | Admitting: Adult Health

## 2018-03-07 LAB — HEPATIC FUNCTION PANEL
AG Ratio: 1.8 (calc) (ref 1.0–2.5)
ALBUMIN MSPROF: 4.4 g/dL (ref 3.6–5.1)
ALT: 20 U/L (ref 9–46)
AST: 19 U/L (ref 10–35)
Alkaline phosphatase (APISO): 55 U/L (ref 40–115)
BILIRUBIN DIRECT: 0.1 mg/dL (ref 0.0–0.2)
BILIRUBIN INDIRECT: 0.4 mg/dL (ref 0.2–1.2)
BILIRUBIN TOTAL: 0.5 mg/dL (ref 0.2–1.2)
GLOBULIN: 2.5 g/dL (ref 1.9–3.7)
Total Protein: 6.9 g/dL (ref 6.1–8.1)

## 2018-03-07 LAB — CBC WITH DIFFERENTIAL/PLATELET
BASOS PCT: 0.3 %
Basophils Absolute: 19 cells/uL (ref 0–200)
EOS ABS: 90 {cells}/uL (ref 15–500)
Eosinophils Relative: 1.4 %
HCT: 41.7 % (ref 38.5–50.0)
Hemoglobin: 14.9 g/dL (ref 13.2–17.1)
Lymphs Abs: 1517 cells/uL (ref 850–3900)
MCH: 31.8 pg (ref 27.0–33.0)
MCHC: 35.7 g/dL (ref 32.0–36.0)
MCV: 89.1 fL (ref 80.0–100.0)
MONOS PCT: 10.5 %
MPV: 10.9 fL (ref 7.5–12.5)
Neutro Abs: 4102 cells/uL (ref 1500–7800)
Neutrophils Relative %: 64.1 %
PLATELETS: 226 10*3/uL (ref 140–400)
RBC: 4.68 10*6/uL (ref 4.20–5.80)
RDW: 12.8 % (ref 11.0–15.0)
TOTAL LYMPHOCYTE: 23.7 %
WBC mixed population: 672 cells/uL (ref 200–950)
WBC: 6.4 10*3/uL (ref 3.8–10.8)

## 2018-03-07 LAB — BASIC METABOLIC PANEL WITH GFR
BUN/Creatinine Ratio: 12 (calc) (ref 6–22)
BUN: 17 mg/dL (ref 7–25)
CHLORIDE: 105 mmol/L (ref 98–110)
CO2: 25 mmol/L (ref 20–32)
Calcium: 9.7 mg/dL (ref 8.6–10.3)
Creat: 1.4 mg/dL — ABNORMAL HIGH (ref 0.70–1.25)
GFR, EST AFRICAN AMERICAN: 62 mL/min/{1.73_m2} (ref >=60)
GFR, EST NON AFRICAN AMERICAN: 53 mL/min/{1.73_m2} — AB (ref >=60)
Glucose, Bld: 82 mg/dL (ref 65–99)
POTASSIUM: 4.5 mmol/L (ref 3.5–5.3)
Sodium: 136 mmol/L (ref 135–146)

## 2018-03-07 LAB — NO CULTURE INDICATED

## 2018-03-07 LAB — URINALYSIS W MICROSCOPIC + REFLEX CULTURE
BACTERIA UA: NONE SEEN /HPF
Bilirubin Urine: NEGATIVE
GLUCOSE, UA: NEGATIVE
HGB URINE DIPSTICK: NEGATIVE
HYALINE CAST: NONE SEEN /LPF
Ketones, ur: NEGATIVE
Leukocyte Esterase: NEGATIVE
Nitrites, Initial: NEGATIVE
PROTEIN: NEGATIVE
RBC / HPF: NONE SEEN /HPF (ref 0–2)
SQUAMOUS EPITHELIAL / LPF: NONE SEEN /HPF (ref <=5)
Specific Gravity, Urine: 1.014 (ref 1.001–1.03)
WBC UA: NONE SEEN /HPF (ref 0–5)
pH: <=5 (ref 5.0–8.0)

## 2018-03-21 ENCOUNTER — Ambulatory Visit
Admission: RE | Admit: 2018-03-21 | Discharge: 2018-03-21 | Disposition: A | Discharge status: Home / Self Care | Payer: BLUE CROSS/BLUE SHIELD | Source: Ambulatory Visit | Attending: Adult Health | Admitting: Adult Health

## 2018-03-21 DIAGNOSIS — R1011 Right upper quadrant pain: Secondary | ICD-10-CM

## 2018-03-28 ENCOUNTER — Telehealth: Payer: Self-pay

## 2018-03-28 NOTE — Telephone Encounter (Signed)
Faxed request for medical records to Dr Acquanetta Sit in [facility] 03/28/18  LM

## 2018-04-05 ENCOUNTER — Telehealth: Payer: Self-pay | Admitting: Gastroenterology

## 2018-04-05 NOTE — Telephone Encounter (Signed)
Received referral for patient to be seen here for chronic RUQ pain. Patient saw Dr.Kaplan in [address] but then saw Dr.Medoff and a gi doc in [facility]. Patient states he was referred here and so he wants to come here and see Dr.Armbruster. Records from both places received and placed on Dr.Armbruster's desk for review.

## 2018-04-11 ENCOUNTER — Encounter: Payer: Self-pay | Admitting: Gastroenterology

## 2018-04-12 ENCOUNTER — Encounter: Payer: Self-pay | Admitting: Internal Medicine

## 2018-04-12 ENCOUNTER — Ambulatory Visit: Payer: BLUE CROSS/BLUE SHIELD | Admitting: Internal Medicine

## 2018-04-12 VITALS — BP 138/88 | HR 85 | Ht 73.0 in | Wt 234.0 lb

## 2018-04-12 DIAGNOSIS — R0789 Other chest pain: Secondary | ICD-10-CM

## 2018-04-12 DIAGNOSIS — J449 Chronic obstructive pulmonary disease, unspecified: Secondary | ICD-10-CM

## 2018-04-12 DIAGNOSIS — F17210 Nicotine dependence, cigarettes, uncomplicated: Secondary | ICD-10-CM | POA: Diagnosis not present

## 2018-04-12 MED ORDER — VARENICLINE TARTRATE 0.5 MG X 11 & 1 MG X 42 PO MISC: ORAL | 0 refills | Status: DC

## 2018-04-12 NOTE — Progress Notes (Signed)
Subjective:     Patient ID: Anthony Paul, male   DOB: 1956-04-01,    MRN: [id number]  HPI  1 yowm active smoker with GOLD 0 as of 12/2013 pfts with good activity tolerance on no inhalers with unexplained migratory R > L cp x "years"  self referred back to pulmonary clinic 04/12/2018    04/12/2018 1st [facility] office visit/ Cletis Clack   Chief Complaint  Patient presents with  . Consult    COPD - saw DR. Joya Gaskins , feels like it has progressively gotten worse more SOB , some cough   doe indolent onset x 2017 = MMRC1 = can walk nl pace, flat grade, can't hurry or go uphills or steps s sob   Not much cough Cp started ? > 6  y prior to OV indolent onset much better   early first thing in am /better p flatus/ does not typically wake him - continuously aware esp sitting or bending over= worst with neg u/s of GI , not worse with ex  Or cough  S/p partial colectomy for divercular dz around 2013 [place] maybe he had the cp then not sure    No obvious day to day or daytime variability or assoc excess/ purulent sputum or mucus plugs or hemoptysis  or chest tightness, subjective wheeze or overt sinus or hb symptoms. No unusual exposure hx or h/o childhood pna/ asthma or knowledge of premature birth.  Sleeping  Ok flat   without nocturnal  or early am exacerbation  of respiratory  c/o's or need for noct saba. Also denies any obvious fluctuation of symptoms with weather or environmental changes or other aggravating or alleviating factors except as outlined above   Current Allergies, Complete Past Medical History, Past Surgical History, Family History, and Social History were reviewed in [employer] record.  ROS  The following are not active complaints unless bolded Hoarseness, sore throat, dysphagia, dental problems, itching, sneezing,  nasal congestion or discharge of excess mucus or purulent secretions, ear ache,   fever, chills, sweats, unintended wt loss or wt gain,  classically pleuritic or exertional cp,  orthopnea pnd or arm/hand swelling  or leg swelling, presyncope, palpitations, abdominal pain, anorexia, nausea, vomiting, diarrhea  or change in bowel habits or change in bladder habits, change in stools or change in urine, dysuria, hematuria,  rash, arthralgias, visual complaints, headache, numbness, weakness or ataxia or problems with walking or coordination,  change in mood or  memory.        Current Meds  Medication Sig  . acetaminophen (TYLENOL) 500 MG tablet Take 1,500 mg by mouth every 6 (six) hours as needed for mild pain.  Marland Kitchen aspirin 81 MG tablet Take 81 mg by mouth daily.   . Cholecalciferol (VITAMIN D) 2000 UNITS CAPS Take 1 capsule by mouth daily.   . fenofibrate micronized (LOFIBRA) 134 MG capsule TAKE ONE CAPSULE BY MOUTH EVERY DAY BEFORE BREAKFAST.  Marland Kitchen Flaxseed, Linseed, (FLAXSEED OIL) 1200 MG CAPS Take 1 capsule by mouth daily.  Marland Kitchen HYDROcodone-acetaminophen (NORCO) 10-325 MG tablet Take 1 tablet by mouth 2 (two) times daily as needed.  . Omega-3 Fatty Acids (FISH OIL PO) Take 1 tablet by mouth daily.  . traZODone (DESYREL) 150 MG tablet TAKE 1/2-1 TABLET BY MOUTH AT BEDTIME AS NEEDED           Review of Systems     Objective:   Physical Exam    amb wm nad  Wt Readings  from Last 3 Encounters:  04/12/18 234 lb (106.1 kg)  03/06/18 237 lb (107.5 kg)  10/24/17 231 lb 9.6 oz (105.1 kg)     Vital signs reviewed - Note on arrival 02 sats  95% on  RA     HEENT: nl dentition, turbinates bilaterally, and oropharynx. Nl external ear canals without cough reflex   NECK :  without JVD/Nodes/TM/ nl carotid upstrokes bilaterally   LUNGS: no acc muscle use,  Nl contour chest which is clear to A and P bilaterally without cough on insp or exp maneuvers   CV:  RRR  no s3 or murmur or increase in P2, and no edema   ABD:  soft and nontender with nl inspiratory excursion in the supine position. No bruits or organomegaly appreciated,  bowel sounds nl  MS:  Nl gait/ ext warm without deformities, calf tenderness, cyanosis or clubbing No obvious joint restrictions   SKIN: warm and dry without lesions    NEURO:  alert, approp, nl sensorium with  no motor or cerebellar deficits apparent.           Assessment:

## 2018-04-12 NOTE — Patient Instructions (Addendum)
Set a quit date and take the chantix 7 days before you quit and follow the directions and see your primary doctor or me to refill it at the end of the trial if you feel it helps  Classic subdiaphragmatic pain pattern suggests ibs:  Stereotypical, migratory with a very limited distribution of pain locations, daytime, not usually exacerbated by exercise  or coughing, worse in sitting position, frequently associated with generalized abd bloating, not as likely to be present supine due to the dome effect of the diaphragm which  is  canceled in that position. Frequently these patients have had multiple negative GI workups and CT scans.  Treatment consists of avoiding foods that cause gas (especially boiled eggs, mexcican food but especially  beans and undercooked vegetables like  spinach and some salads)  and citrucel 1 heaping tsp twice daily with a large glass of water.  Pain should improve w/in 2 weeks and if not then consider further GI work up.     We will call you to schedule  the CT chest without contrast  And we will call you the results    We set up full pfts and call with those results

## 2018-04-13 ENCOUNTER — Encounter: Payer: Self-pay | Admitting: Internal Medicine

## 2018-04-13 DIAGNOSIS — F17210 Nicotine dependence, cigarettes, uncomplicated: Secondary | ICD-10-CM | POA: Insufficient documentation

## 2018-04-13 DIAGNOSIS — R0789 Other chest pain: Secondary | ICD-10-CM | POA: Insufficient documentation

## 2018-04-13 NOTE — Assessment & Plan Note (Signed)
 >   3 min Discussed the risks and costs (both direct and indirect)  of smoking relative to the benefits of quitting and patient unwilling to commit at this point to a specific quit date but wants chantix on hand for when he does.    Discussed in detail all the  indications, usual  risks and alternatives  relative to the benefits with patient who agrees to proceed with chantix trial with Follow up per Primary Care planned  At end of starter pack or can return here for refills if needed

## 2018-04-13 NOTE — Assessment & Plan Note (Signed)
PFT's  12/23/13   FEV1 4.27 (105 % ) ratio 78  p 5 % improvement from saba p ? prior to study with DLCO  72 % corrects to 62  % for alv volume     I reviewed the Fletcher curve with the patient that basically indicates  if you quit smoking when your best day FEV1 is still well preserved (as is clearly  the case here)  it is highly unlikely you will progress to severe disease and informed the patient there was  no medication on the market that has proven to alter the curve/ its downward trajectory  or the likelihood of progression of their disease(unlike other chronic medical conditions such as atheroclerosis where we do think we can change the natural hx with risk reducing meds)    Therefore stopping smoking and maintaining abstinence are  the most important aspects of care, not choice of inhalers or for that matter, doctors.   Treatment other than smoking cessation  is entirely directed by severity of symptoms and focused also on reducing exacerbations, not attempting to change the natural history of the disease.    No exac and symptoms of doe don't warrant aggressive rx but will rec repeat pfts and make additional recs then and for now focus on smoking cessation (see separate a/p)   Total time devoted to counseling  > 50 % of initial 60 min office visit:  review case with pt/ discussion of options/alternatives/ personally creating written customized instructions  in presence of pt  then going over those specific  Instructions directly with the pt including how to use all of the meds but in particular covering each new medication in detail and the difference between the maintenance= "automatic" meds and the prns using an action plan format for the latter (If this problem/symptom => do that organization reading Left to right).  Please see AVS from this visit for a full list of these instructions which I personally wrote for this pt and  are unique to this visit.

## 2018-04-13 NOTE — Assessment & Plan Note (Addendum)
Onset around 2013? - trial of ibs rx/ citrucel 04/12/2018 >>>    Classic subdiaphragmatic pain pattern suggests ibs:  Stereotypical, migratory with a very limited distribution of pain locations, daytime, not usually exacerbated by exercise  or coughing, worse in sitting position, frequently associated with generalized abd bloating, not as likely to be present supine due to the dome effect of the diaphragm which  is  canceled in that position. Frequently these patients have had multiple negative GI workups and CT scans.  Since he's not had a CT chest or even cxr x years I rec doing this to be complete.  Treatment consists of avoiding foods that cause gas (especially boiled eggs, mexcican food but especially  beans and undercooked vegetables like  spinach and some salads)  and citrucel 1 heaping tsp twice daily with a large glass of water.  Pain should improve w/in 2 weeks and if not then consider further GI work up.     F/u prn

## 2018-04-19 ENCOUNTER — Telehealth: Payer: Self-pay | Admitting: Internal Medicine

## 2018-04-19 NOTE — Telephone Encounter (Signed)
CT Chest done at [facility] 04/16/18 was reviewed by MW  Per MW- the scan was normal  Spoke with Beverlee Nims, pt's spouse and notified of results and she verbalized understanding and will inform the pt

## 2018-04-20 ENCOUNTER — Inpatient Hospital Stay: Admission: RE | Admit: 2018-04-20 | Payer: BLUE CROSS/BLUE SHIELD | Source: Ambulatory Visit

## 2018-04-23 ENCOUNTER — Other Ambulatory Visit: Payer: Self-pay | Admitting: Internal Medicine

## 2018-04-23 DIAGNOSIS — J449 Chronic obstructive pulmonary disease, unspecified: Secondary | ICD-10-CM

## 2018-04-25 ENCOUNTER — Ambulatory Visit (INDEPENDENT_AMBULATORY_CARE_PROVIDER_SITE_OTHER): Payer: BLUE CROSS/BLUE SHIELD | Admitting: Internal Medicine

## 2018-04-25 DIAGNOSIS — J449 Chronic obstructive pulmonary disease, unspecified: Secondary | ICD-10-CM | POA: Diagnosis not present

## 2018-04-25 LAB — PULMONARY FUNCTION TEST
DL/VA % pred: 72 %
DL/VA: 3.47 ml/min/mmHg/L
DLCO unc % pred: 75 %
DLCO unc: 27.62 ml/min/mmHg
FEF 25-75 Post: 4.25 L/sec
FEF 25-75 Pre: 3.68 L/sec
FEF2575-%CHANGE-POST: 15 %
FEF2575-%PRED-PRE: 118 %
FEF2575-%Pred-Post: 137 %
FEV1-%Change-Post: 3 %
FEV1-%PRED-POST: 109 %
FEV1-%PRED-PRE: 106 %
FEV1-POST: 4.26 L
FEV1-Pre: 4.13 L
FEV1FVC-%CHANGE-POST: 4 %
FEV1FVC-%Pred-Pre: 104 %
FEV6-%Change-Post: 0 %
FEV6-%PRED-POST: 105 %
FEV6-%PRED-PRE: 105 %
FEV6-Post: 5.22 L
FEV6-Pre: 5.21 L
FEV6FVC-%Change-Post: 1 %
FEV6FVC-%PRED-POST: 104 %
FEV6FVC-%PRED-PRE: 103 %
FVC-%CHANGE-POST: -1 %
FVC-%PRED-PRE: 102 %
FVC-%Pred-Post: 101 %
FVC-Post: 5.23 L
FVC-Pre: 5.3 L
POST FEV6/FVC RATIO: 100 %
PRE FEV1/FVC RATIO: 78 %
PRE FEV6/FVC RATIO: 98 %
Post FEV1/FVC ratio: 81 %
RV % PRED: 145 %
RV: 3.6 L
TLC % PRED: 112 %
TLC: 8.58 L

## 2018-04-25 NOTE — Progress Notes (Signed)
PFT completed today.Anthony Paul,CMA  

## 2018-04-25 NOTE — Progress Notes (Signed)
Complete Physical  Assessment and Plan:  Bicuspid aortic valve Has yearly following, recent echo, no symptoms at this time  Borderline systolic HTN - continue medications, DASH diet, exercise and monitor at home. Call if greater than 130/80.  -     CBC with Differential/Platelet -     COMPLETE METABOLIC PANEL WITH GFR -     TSH -     Urinalysis, Routine w reflex microscopic -     Microalbumin / creatinine urine ratio  COPD GOLD 0  Recent normal CT chest Continue follow up pulmonary  Mixed hyperlipidemia -continue medications, check lipids, decrease fatty foods, increase activity.  -     Lipid panel  Prediabetes Discussed disease progression and risks Discussed diet/exercise, weight management and risk modification  Medication management -     Magnesium  Vitamin D deficiency -     VITAMIN D 25 Hydroxy (Vit-D Deficiency, Fractures)  Cigarette smoker Smoking cessation-  instruction/counseling given, counseled patient on the dangers of tobacco use, advised patient to stop smoking, and reviewed strategies to maximize success, patient not ready to quit at this time.   Erectile dysfunction, unspecified erectile dysfunction type -     PSA  Osteoarthritis, unspecified osteoarthritis type, unspecified site Continue pain management Has failed cymbalta, lyrica, gabaptentin  Encounter for general adult medical examination with abnormal findings 1 year  BMI 30.0-30.9,adult  Overweight  - long discussion about weight loss, diet, and exercise -recommended diet heavy in fruits and veggies and low in animal meats, cheeses, and dairy products  Screening PSA (prostate specific antigen) -     PSA  Acute pain of left shoulder -     Ambulatory referral to Orthopedics - likely subacromial bursitis/OA with some rotator cuff tendonitis - will send for evaluation/possible injections  Discussed med's effects and SE's. Screening labs and tests as requested with regular follow-up as  recommended. Over 40 minutes of exam, counseling, chart review and critical decision making was performed Future Appointments  Date Time Provider [place]  06/07/2018  9:30 AM Armbruster, Carlota Raspberry, MD [facility]  10/22/2018  9:30 AM [facility] 3 [id number]  05/02/2019  9:00 AM [name], PA-C [facility] None     HPI Patient presents for a complete physical.   He sees pain management for rib pain, has tried lyrica/gabpentin without help, is on norco, no help with Cymbalta. Had recent normal AB [place] and was referred to GI for RUQ/rib pain.  He is right handed, complains of left shoulder pain off and on x 2-3 months, no injury, worse with pressure/sleeping on it.   His blood pressure has been controlled at home, today their BP is BP: 118/76   He has a bicuspid aortic valve, follows with cardiology.  He does workout. He denies chest pain, shortness of breath, dizziness.   BMI is Body mass index is 30.74 kg/m., he is working on diet and exercise. Wt Readings from Last 3 Encounters:  04/30/18 239 lb 6.4 oz (108.6 kg)  04/12/18 234 lb (106.1 kg)  03/06/18 237 lb (107.5 kg)   He is on cholesterol medication, fenofibrate and denies myalgias. His cholesterol is not at goal. The cholesterol last visit was:   Lab Results  Component Value Date   CHOL 188 10/24/2017   HDL 26 (L) 10/24/2017   LDLCALC 117 (H) 10/24/2017   TRIG 291 (H) 10/24/2017   CHOLHDL 7.2 (H) 10/24/2017    Last A1C in the office was:  Lab Results  Component  Value Date   HGBA1C 4.9 04/11/2017   Last GFR: Lab Results  Component Value Date   GFRNONAA 53 (L) 03/06/2018    Patient is on Vitamin D supplement.   Lab Results  Component Value Date   VD25OH 66 04/11/2017     Last PSA was: Lab Results  Component Value Date   PSA 3.4 04/11/2017     Current Medications:  Current Outpatient Medications on File Prior to Visit  Medication Sig Dispense Refill  . acetaminophen  (TYLENOL) 500 MG tablet Take 1,500 mg by mouth every 6 (six) hours as needed for mild pain.    Marland Kitchen aspirin 81 MG tablet Take 81 mg by mouth daily.     . Cholecalciferol (VITAMIN D) 2000 UNITS CAPS Take 1 capsule by mouth daily.     . fenofibrate micronized (LOFIBRA) 134 MG capsule TAKE ONE CAPSULE BY MOUTH EVERY DAY BEFORE BREAKFAST. 90 capsule 1  . Flaxseed, Linseed, (FLAXSEED OIL) 1200 MG CAPS Take 1 capsule by mouth daily.    Marland Kitchen HYDROcodone-acetaminophen (NORCO) 10-325 MG tablet Take 1 tablet by mouth 2 (two) times daily as needed.  0  . Omega-3 Fatty Acids (FISH OIL PO) Take 1 tablet by mouth daily.    . traZODone (DESYREL) 150 MG tablet TAKE 1/2-1 TABLET BY MOUTH AT BEDTIME AS NEEDED 90 tablet 1   No current facility-administered medications on file prior to visit.    Allergies:  Allergies  Allergen Reactions  . Morphine Itching    Can take hydrocodone without issues   Health Maintenance:  Immunization History  Administered Date(s) Administered  . Influenza Inj Mdck Quad With Preservative 10/24/2017  . Influenza Split 10/05/2015  . Influenza,inj,Quad PF,6+ Mos 11/15/2013  . Influenza,inj,quad, With Preservative 09/26/2016  . Influenza-Unspecified 12/19/2009  . PPD Test 03/09/2015, 03/14/2016  . Pneumococcal Conjugate-13 11/15/2013  . Pneumococcal-Unspecified 12/20/1995  . Td 12/19/2004  . Tdap 03/09/2015   Tetanus: 2016 Pneumovax:1997 Prevnar 13: 2014 Flu vaccine:2018 Zostavax: DUE needs new one  DEXA: N/A Colonoscopy:  12/2013 Medoff repeat 2020 seeing lebeuar now.  EGD: N/A PFTs  04/2018 CT chest 03/2018 at [facility]- plaque on aortic arch, normal chest CXR 2015 Echo 10/2017 Stress test 09/2016  Eye Exam: None Dentist: Dr. Jacquenette Shone  Patient Care Team: Unk Pinto, MD as PCP - General (Internal Medicine)  Medical History:  has COPD GOLD 0 ; Bicuspid aortic valve; Mixed hyperlipidemia; Borderline systolic HTN; ED (erectile dysfunction); DJD (degenerative joint  disease); Prediabetes; Vitamin D deficiency; Medication management; Chest pain, atypical most c/w ibs ; and Cigarette smoker on their problem list. Surgical History:  He  has a past surgical history that includes Colon surgery (Jan 2012); Tonsillectomy; Hemorroidectomy; Shoulder surgery; Colonoscopy (01/13/2014); Upper gi endoscopy (2015); and Inguinal hernia repair (Left, 2015). Family History:  His family history includes Benign prostatic hyperplasia in his father; Heart disease in his father. Social History:   reports that he has been smoking cigarettes.  He has been smoking about 0.50 packs per day. He quit smokeless tobacco use about 11 years ago. His smokeless tobacco use included chew. He reports that he drinks about 3.0 oz of alcohol per week. He reports that he does not use drugs.   Review of Systems:  Review of Systems  Constitutional: Negative.   HENT: Negative.   Eyes: Negative.   Respiratory: Negative.   Cardiovascular: Negative.   Gastrointestinal: Negative.   Genitourinary: Negative.   Musculoskeletal: Positive for myalgias.  Skin: Negative.     Physical Exam:  Estimated body mass index is 30.74 kg/m as calculated from the following:   Height as of this encounter: 6\' 2"  (1.88 m).   Weight as of this encounter: 239 lb 6.4 oz (108.6 kg). BP 118/76   Pulse 75   Temp 97.7 F (36.5 C)   Resp 16   Ht 6\' 2"  (1.88 m)   Wt 239 lb 6.4 oz (108.6 kg)   SpO2 98%   BMI 30.74 kg/m  General Appearance: Well nourished, in no apparent distress.  Eyes: PERRLA, EOMs, conjunctiva no swelling or erythema, normal fundi and vessels.  Sinuses: No Frontal/maxillary tenderness  ENT/Mouth: Ext aud canals clear, normal light reflex with TMs without erythema, bulging. Good dentition. No erythema, swelling, or exudate on post pharynx. Tonsils not swollen or erythematous. Hearing normal.  Neck: Supple, thyroid normal. No bruits  Respiratory: Respiratory effort normal, BS equal bilaterally  without rales, rhonchi, wheezing or stridor.  Cardio: RRR without murmurs, rubs or gallops. Brisk peripheral pulses without edema.  Chest: symmetric, with normal excursions and percussion.  Abdomen: Soft, nontender, no guarding, rebound, hernias, masses, or organomegaly.  Lymphatics: Non tender without lymphadenopathy.  Genitourinary: defer Musculoskeletal: Full ROM all peripheral extremities,5/5 strength, and normal gait.  Skin: Warm, dry without rashes, lesions, ecchymosis. Neuro: Cranial nerves intact, reflexes equal bilaterally. Normal muscle tone, no cerebellar symptoms. Sensation intact.  Psych: Awake and oriented X 3, normal affect, Insight and Judgment appropriate.   EKG: defer cardio  [name] 9:15 AM [facility]

## 2018-04-30 ENCOUNTER — Ambulatory Visit (INDEPENDENT_AMBULATORY_CARE_PROVIDER_SITE_OTHER): Payer: BLUE CROSS/BLUE SHIELD | Admitting: Physician Assistant

## 2018-04-30 ENCOUNTER — Telehealth: Payer: Self-pay | Admitting: Internal Medicine

## 2018-04-30 ENCOUNTER — Encounter: Payer: Self-pay | Admitting: Physician Assistant

## 2018-04-30 VITALS — BP 118/76 | HR 75 | Temp 97.7°F | Resp 16 | Ht 74.0 in | Wt 239.4 lb

## 2018-04-30 DIAGNOSIS — Z6830 Body mass index (BMI) 30.0-30.9, adult: Secondary | ICD-10-CM

## 2018-04-30 DIAGNOSIS — Z1389 Encounter for screening for other disorder: Secondary | ICD-10-CM

## 2018-04-30 DIAGNOSIS — E559 Vitamin D deficiency, unspecified: Secondary | ICD-10-CM

## 2018-04-30 DIAGNOSIS — Z79899 Other long term (current) drug therapy: Secondary | ICD-10-CM

## 2018-04-30 DIAGNOSIS — Z13220 Encounter for screening for lipoid disorders: Secondary | ICD-10-CM

## 2018-04-30 DIAGNOSIS — Z1329 Encounter for screening for other suspected endocrine disorder: Secondary | ICD-10-CM

## 2018-04-30 DIAGNOSIS — Z0001 Encounter for general adult medical examination with abnormal findings: Secondary | ICD-10-CM

## 2018-04-30 DIAGNOSIS — Z125 Encounter for screening for malignant neoplasm of prostate: Secondary | ICD-10-CM | POA: Diagnosis not present

## 2018-04-30 DIAGNOSIS — Z0000 Encounter for general adult medical examination without abnormal findings: Secondary | ICD-10-CM

## 2018-04-30 DIAGNOSIS — F17210 Nicotine dependence, cigarettes, uncomplicated: Secondary | ICD-10-CM

## 2018-04-30 DIAGNOSIS — Q231 Congenital insufficiency of aortic valve: Secondary | ICD-10-CM

## 2018-04-30 DIAGNOSIS — J449 Chronic obstructive pulmonary disease, unspecified: Secondary | ICD-10-CM

## 2018-04-30 DIAGNOSIS — M25512 Pain in left shoulder: Secondary | ICD-10-CM

## 2018-04-30 DIAGNOSIS — R7303 Prediabetes: Secondary | ICD-10-CM

## 2018-04-30 DIAGNOSIS — E782 Mixed hyperlipidemia: Secondary | ICD-10-CM

## 2018-04-30 DIAGNOSIS — M1990 Unspecified osteoarthritis, unspecified site: Secondary | ICD-10-CM

## 2018-04-30 DIAGNOSIS — R030 Elevated blood-pressure reading, without diagnosis of hypertension: Secondary | ICD-10-CM

## 2018-04-30 DIAGNOSIS — N529 Male erectile dysfunction, unspecified: Secondary | ICD-10-CM

## 2018-04-30 NOTE — Progress Notes (Signed)
LMTCB

## 2018-04-30 NOTE — Telephone Encounter (Signed)
6 weeks is fine

## 2018-04-30 NOTE — Telephone Encounter (Signed)
Notes recorded by Rosana Berger, CMA on 04/30/2018 at 9:30 AM EDT [facility] ------  Notes recorded by Tanda Rockers, MD on 04/30/2018 at 6:28 AM EDT Call patient : Study is unremarkable - no evidence of copd or asthma this point . Be sure patient has f/u ov so we can go over all the details of this study and get a plan together moving forward - ok to move up f/u if not feeling better and wants to be seen sooner.  Pt aware of results and would like to know how soon he should f/u with MD.   Dr. Melvyn Novas please advise. Thanks.

## 2018-04-30 NOTE — Patient Instructions (Addendum)
Check out  Mini habits for weight loss book  2 apps for tracking food is myfitness pal  loseit OR can take picture of your food  Intermittent fasting is more about strategy than starvation. It's meant to reset your body in different ways, hopefully with fitness and nutrition changes as a result.  Like any big switchover, though, results may vary when it comes down to the individual level. What works for your friends may not work for you, or vice versa. That's why it's helpful to play around with variations on intermittent fasting and healthy habits and find what works best for you.  WHAT IS INTERMITTENT FASTING AND WHY DO IT?  Intermittent fasting doesn't involve specific foods, but rather, a strict schedule regarding when you eat. Also called "time-restricted eating," the tactic has been praised for its contribution to weight loss, improved body composition, and decreased cravings. Preliminary research also suggests it may be beneficial for glucose tolerance, hormone regulation, better muscle mass and lower body fat.  Part of its appeal is the simplicity of the effort. Unlike some other trends, there's no calculations to intermittent fasting.  You simply eat within a certain block of time, usually a window of 8-10 hours. In the other big block of time - about 14-16 hours, including when you're asleep - you don't eat anything, not even snacks. You can drink water, coffee, tea or any other beverage that doesn't have calories.  For example, if you like having a late dinner, you might skip breakfast and have your first meal at noon and your last meal of the day at 8 p.m., and then not eat until noon again the next day.  IDEAS FOR GETTING STARTED  If you're new to the strategy, it may be helpful to eat within the typical circadian rhythm and keep eating within daylight hours. This can be especially beneficial if you're looking at intermittent fasting for weight-loss goals.  So first try only  eating between 12pm to 8pm.  Outside of this time you may have water, black coffee, and hot tea. You may not eat it drink anything that has carbs, sugars, OR artificial sugars like diet soda.   Like any major eating and fitness shift, it can take time to find the perfect fit, so don't be afraid to experiment with different options - including ditching intermittent fasting altogether if it's simply not for you. But if it is, you may be surprised by some of the benefits that come along with the strategy.  Are you an emotional eater? Do you eat more when you're feeling stressed? Do you eat when you're not hungry or when you're full? Do you eat to feel better (to calm and soothe yourself when you're sad, mad, bored, anxious, etc.)? Do you reward yourself with food? Do you regularly eat until you've stuffed yourself? Does food make you feel safe? Do you feel like food is a friend? Do you feel powerless or out of control around food?  If you answered yes to some of these questions than it is likely that you are an emotional eater. This is normally a learned behavior and can take time to first recognize the signs and second BREAK THE HABIT. But here is more information and tips to help.   The difference between emotional hunger and physical hunger Emotional hunger can be powerful, so it's easy to mistake it for physical hunger. But there are clues you can look for to help you tell physical and emotional hunger  apart.  Emotional hunger comes on suddenly. It hits you in an instant and feels overwhelming and urgent. Physical hunger, on the other hand, comes on more gradually. The urge to eat doesn't feel as dire or demand instant satisfaction (unless you haven't eaten for a very long time).  Emotional hunger craves specific comfort foods. When you're physically hungry, almost anything sounds good-including healthy stuff like vegetables. But emotional hunger craves junk food or sugary snacks that provide  an instant rush. You feel like you need cheesecake or pizza, and nothing else will do.  Emotional hunger often leads to mindless eating. Before you know it, you've eaten a whole bag of chips or an entire pint of ice cream without really paying attention or fully enjoying it. When you're eating in response to physical hunger, you're typically more aware of what you're doing.  Emotional hunger isn't satisfied once you're full. You keep wanting more and more, often eating until you're uncomfortably stuffed. Physical hunger, on the other hand, doesn't need to be stuffed. You feel satisfied when your stomach is full.  Emotional hunger isn't located in the stomach. Rather than a growling belly or a pang in your stomach, you feel your hunger as a craving you can't get out of your head. You're focused on specific textures, tastes, and smells.  Emotional hunger often leads to regret, guilt, or shame. When you eat to satisfy physical hunger, you're unlikely to feel guilty or ashamed because you're simply giving your body what it needs. If you feel guilty after you eat, it's likely because you know deep down that you're not eating for nutritional reasons.  Identify your emotional eating triggers What situations, places, or feelings make you reach for the comfort of food? Most emotional eating is linked to unpleasant feelings, but it can also be triggered by positive emotions, such as rewarding yourself for achieving a goal or celebrating a holiday or happy event. Common causes of emotional eating include:  Stuffing emotions - Eating can be a way to temporarily silence or "stuff down" uncomfortable emotions, including anger, fear, sadness, anxiety, loneliness, resentment, and shame. While you're numbing yourself with food, you can avoid the difficult emotions you'd rather not feel.  Boredom or feelings of emptiness - Do you ever eat simply to give yourself something to do, to relieve boredom, or as a way to fill  a void in your life? You feel unfulfilled and empty, and food is a way to occupy your mouth and your time. In the moment, it fills you up and distracts you from underlying feelings of purposelessness and dissatisfaction with your life.  Childhood habits - Think back to your childhood memories of food. Did your parents reward good behavior with ice cream, take you out for pizza when you got a good report card, or serve you sweets when you were feeling sad? These habits can often carry over into adulthood. Or your eating may be driven by nostalgia-for cherished memories of grilling burgers in the backyard with your dad or baking and eating cookies with your mom.  Social influences - Getting together with other people for a meal is a great way to relieve stress, but it can also lead to overeating. It's easy to overindulge simply because the food is there or because everyone else is eating. You may also overeat in social situations out of nervousness. Or perhaps your family or circle of friends encourages you to overeat, and it's easier to go along with the group.  Stress - Ever notice how stress makes you hungry? It's not just in your mind. When stress is chronic, as it so often is in our chaotic, fast-paced world, your body produces high levels of the stress hormone, cortisol. Cortisol triggers cravings for salty, sweet, and fried foods-foods that give you a burst of energy and pleasure. The more uncontrolled stress in your life, the more likely you are to turn to food for emotional relief.  Find other ways to feed your feelings If you don't know how to manage your emotions in a way that doesn't involve food, you won't be able to control your eating habits for very long. Diets so often fail because they offer logical nutritional advice which only works if you have conscious control over your eating habits. It doesn't work when emotions hijack the process, demanding an immediate payoff with food.  In order  to stop emotional eating, you have to find other ways to fulfill yourself emotionally. It's not enough to understand the cycle of emotional eating or even to understand your triggers, although that's a huge first step. You need alternatives to food that you can turn to for emotional fulfillment.  Alternatives to emotional eating If you're depressed or lonely, call someone who always makes you feel better, play with your dog or cat, or look at a favorite photo or cherished memento.  If you're anxious, expend your nervous energy by dancing to your favorite song, squeezing a stress ball, or taking a brisk walk.  If you're exhausted, treat yourself with a hot cup of tea, take a bath, light some scented candles, or wrap yourself in a warm blanket.  If you're bored, read a good book, watch a comedy show, explore the outdoors, or turn to an activity you enjoy (woodworking, playing the guitar, shooting hoops, scrapbooking, etc.).  What is mindful eating? Mindful eating is a practice that develops your awareness of eating habits and allows you to pause between your triggers and your actions. Most emotional eaters feel powerless over their food cravings. When the urge to eat hits, you feel an almost unbearable tension that demands to be fed, right now. Because you've tried to resist in the past and failed, you believe that your willpower just isn't up to snuff. But the truth is that you have more power over your cravings than you think.  Take 5 before you give in to a craving Emotional eating tends to be automatic and virtually mindless. Before you even realize what you're doing, you've reached for a tub of ice cream and polished off half of it. But if you can take a moment to pause and reflect when you're hit with a craving, you give yourself the opportunity to make a different decision.  Can you put off eating for five minutes? Or just start with one minute. Don't tell yourself you can't give in to the  craving; remember, the forbidden is extremely tempting. Just tell yourself to wait.  While you're waiting, check in with yourself. How are you feeling? What's going on emotionally? Even if you end up eating, you'll have a better understanding of why you did it. This can help you set yourself up for a different response next time.  How to practice mindful eating Eating while you're also doing other things-such as watching TV, driving, or playing with your phone-can prevent you from fully enjoying your food. Since your mind is elsewhere, you may not feel satisfied or continue eating even though you're no longer  hungry. Eating more mindfully can help focus your mind on your food and the pleasure of a meal and curb overeating.   Eat your meals in a calm place with no distractions, aside from any dining companions.  Try eating with your non-dominant hand or using chopsticks instead of a knife and fork. Eating in such a non-familiar way can slow down how fast you eat and ensure your mind stays focused on your food.  Allow yourself enough time not to have to rush your meal. Set a timer for 20 minutes and pace yourself so you spend at least that much time eating.  Take small bites and chew them well, taking time to notice the different flavors and textures of each mouthful.  Put your utensils down between bites. Take time to consider how you feel-hungry, satiated-before picking up your utensils again.  Try to stop eating before you are full.It takes time for the signal to reach your brain that you've had enough. Don't feel obligated to always clean your plate.  When you've finished your food, take a few moments to assess if you're really still hungry before opting for an extra serving or dessert.  Learn to accept your feelings-even the bad ones  While it may seem that the core problem is that you're powerless over food, emotional eating actually stems from feeling powerless over your emotions. You  don't feel capable of dealing with your feelings head on, so you avoid them with food.  Recommended reading  Mini Habits for weight loss  Healthy Eating: A guide to the new nutrition - [place] Report  10 Tips for Mindful Eating - How mindfulness can help you fully enjoy a meal and the experience of eating-with moderation and restraint. ([place])  Weight Loss: Gain Control of Emotional Eating - Tips to regain control of your eating habits. [facility])  Why Stress Causes People to Overeat -Tips on controlling stress eating. ([place])  Mindful Eating Meditations -Free online mindfulness meditations. ([employer] [facility] [employer])     [employer]  (928)395-4227 for more information or for a free program for smoking cessation help.   You can call QUIT SMART 1-800-QUIT-NOW for free nicotine patches or replacement therapy- if they are out- keep calling  [facility] Can call for smoking cessation classes, (912)453-9094  If you have a smart phone, please look up Smoke Free app, this will help you stay on track and give you information about money you have saved, life that you have gained back and a ton of more information.   We are giving you chantix for smoking cessation. You can do it! And we are here to help! You may have heard some scary side effects about chantix, the three most common I hear about are nausea, crazy dreams and depression.  However, I like for my patients to try to stay on 1/2 a tablet twice a day rather than one tablet twice a day as normally prescribed. This helps decrease the chances of side effects and helps save money by making a one month prescription last two months  Please start the prescription this way:  Start 1/2 tablet by mouth once daily after food with a full glass of water for 3 days Then do 1/2 tablet by mouth twice daily for 4 days. During this first week you can  smoke, but try to stop after this week.  At this point we have several options: 1)  continue on 1/2 tablet twice a day- which I encourage you to do. You can stay on this dose the rest of the time on the medication or if you still feel the need to smoke you can do one of the two options below. 2) do one tablet in the morning and 1/2 in the evening which helps decrease dreams. 3) do one tablet twice a day.   What if I miss a dose? If you miss a dose, take it as soon as you can. If it is almost time for your next dose, take only that dose. Do not take double or extra doses.  What should I watch for while using this medicine? Visit your doctor or health care professional for regular check ups. Ask for ongoing advice and encouragement from your doctor or healthcare professional, friends, and family to help you quit. If you smoke while on this medication, quit again  Your mouth may get dry. Chewing sugarless gum or hard candy, and drinking plenty of water may help. Contact your doctor if the problem does not go away or is severe.  You may get drowsy or dizzy. Do not drive, use machinery, or do anything that needs mental alertness until you know how this medicine affects you. Do not stand or sit up quickly, especially if you are an older patient.   The use of this medicine may increase the chance of suicidal thoughts or actions. Pay special attention to how you are responding while on this medicine. Any worsening of mood, or thoughts of suicide or dying should be reported to your health care professional right away.  ADVANTAGES OF QUITTING SMOKING  Within 20 minutes, blood pressure decreases. Your pulse is at normal level.  After 8 hours, carbon monoxide levels in the blood return to normal. Your oxygen level increases.  After 24 hours, the chance of having a heart attack starts to decrease. Your breath, hair, and body stop smelling like smoke.  After 48 hours, damaged nerve endings begin to  recover. Your sense of taste and smell improve.  After 72 hours, the body is virtually free of nicotine. Your bronchial tubes relax and breathing becomes easier.  After 2 to 12 weeks, lungs can hold more air. Exercise becomes easier and circulation improves.  After 1 year, the risk of coronary heart disease is cut in half.  After 5 years, the risk of stroke falls to the same as a nonsmoker.  After 10 years, the risk of lung cancer is cut in half and the risk of other cancers decreases significantly.  After 15 years, the risk of coronary heart disease drops, usually to the level of a nonsmoker.  You will have extra money to spend on things other than cigarettes.

## 2018-04-30 NOTE — Telephone Encounter (Signed)
LMTCB

## 2018-05-01 LAB — URINALYSIS, ROUTINE W REFLEX MICROSCOPIC
BILIRUBIN URINE: NEGATIVE
HGB URINE DIPSTICK: NEGATIVE
Ketones, ur: NEGATIVE
Leukocytes, UA: NEGATIVE
Nitrite: NEGATIVE
PROTEIN: NEGATIVE
Specific Gravity, Urine: 1.016 (ref 1.001–1.03)
pH: <=5 (ref 5.0–8.0)

## 2018-05-01 LAB — COMPLETE METABOLIC PANEL WITH GFR
AG Ratio: 2.1 (calc) (ref 1.0–2.5)
ALBUMIN MSPROF: 4.4 g/dL (ref 3.6–5.1)
ALT: 17 U/L (ref 9–46)
AST: 16 U/L (ref 10–35)
Alkaline phosphatase (APISO): 57 U/L (ref 40–115)
BILIRUBIN TOTAL: 0.5 mg/dL (ref 0.2–1.2)
BUN / CREAT RATIO: 13 (calc) (ref 6–22)
BUN: 16 mg/dL (ref 7–25)
CO2: 25 mmol/L (ref 20–32)
CREATININE: 1.28 mg/dL — AB (ref 0.70–1.25)
Calcium: 9.7 mg/dL (ref 8.6–10.3)
Chloride: 104 mmol/L (ref 98–110)
GFR, EST AFRICAN AMERICAN: 69 mL/min/{1.73_m2} (ref >=60)
GFR, Est Non African American: 59 mL/min/{1.73_m2} — ABNORMAL LOW (ref >=60)
GLOBULIN: 2.1 g/dL (ref 1.9–3.7)
Glucose, Bld: 76 mg/dL (ref 65–99)
Potassium: 4.6 mmol/L (ref 3.5–5.3)
SODIUM: 138 mmol/L (ref 135–146)
TOTAL PROTEIN: 6.5 g/dL (ref 6.1–8.1)

## 2018-05-01 LAB — VITAMIN D 25 HYDROXY (VIT D DEFICIENCY, FRACTURES): VIT D 25 HYDROXY: 66 ng/mL (ref 30–100)

## 2018-05-01 LAB — MICROALBUMIN / CREATININE URINE RATIO
CREATININE, URINE: 93 mg/dL (ref 20–320)
Microalb, Ur: <0.2 mg/dL

## 2018-05-01 LAB — CBC WITH DIFFERENTIAL/PLATELET
BASOS ABS: 27 {cells}/uL (ref 0–200)
BASOS PCT: 0.4 %
EOS ABS: 308 {cells}/uL (ref 15–500)
Eosinophils Relative: 4.6 %
HEMATOCRIT: 44.4 % (ref 38.5–50.0)
HEMOGLOBIN: 15.6 g/dL (ref 13.2–17.1)
LYMPHS ABS: 1441 {cells}/uL (ref 850–3900)
MCH: 31.6 pg (ref 27.0–33.0)
MCHC: 35.1 g/dL (ref 32.0–36.0)
MCV: 89.9 fL (ref 80.0–100.0)
MONOS PCT: 14 %
MPV: 11.2 fL (ref 7.5–12.5)
NEUTROS ABS: 3987 {cells}/uL (ref 1500–7800)
Neutrophils Relative %: 59.5 %
Platelets: 210 10*3/uL (ref 140–400)
RBC: 4.94 10*6/uL (ref 4.20–5.80)
RDW: 12.6 % (ref 11.0–15.0)
Total Lymphocyte: 21.5 %
WBC: 6.7 10*3/uL (ref 3.8–10.8)
WBCMIX: 938 {cells}/uL (ref 200–950)

## 2018-05-01 LAB — LIPID PANEL
Cholesterol: 207 mg/dL — ABNORMAL HIGH (ref <200)
HDL: 30 mg/dL — ABNORMAL LOW (ref >40)
LDL CHOLESTEROL (CALC): 134 mg/dL — AB
NON-HDL CHOLESTEROL (CALC): 177 mg/dL — AB (ref <130)
TRIGLYCERIDES: 282 mg/dL — AB (ref <150)
Total CHOL/HDL Ratio: 6.9 (calc) — ABNORMAL HIGH (ref <5.0)

## 2018-05-01 LAB — MAGNESIUM: Magnesium: 2.1 mg/dL (ref 1.5–2.5)

## 2018-05-01 LAB — PSA: PSA: 0.5 ng/mL (ref <=4.0)

## 2018-05-01 LAB — TSH: TSH: 3.35 mIU/L (ref 0.40–4.50)

## 2018-05-01 NOTE — Telephone Encounter (Signed)
Attempted to call pt. I did not receive an answer. I have left a message for pt to return our call.  

## 2018-05-02 NOTE — Telephone Encounter (Signed)
Attempted to call pt. I did not receive an answer. I have left a message for pt to return our call.  

## 2018-05-03 NOTE — Telephone Encounter (Signed)
We have attempted to contact the pt several times with no success or call back from the pt. Per triage protocol, message will be closed.  

## 2018-05-08 ENCOUNTER — Ambulatory Visit (INDEPENDENT_AMBULATORY_CARE_PROVIDER_SITE_OTHER): Payer: BLUE CROSS/BLUE SHIELD | Admitting: Orthopaedic Surgery

## 2018-05-08 ENCOUNTER — Encounter (INDEPENDENT_AMBULATORY_CARE_PROVIDER_SITE_OTHER): Payer: Self-pay | Admitting: Orthopaedic Surgery

## 2018-05-08 ENCOUNTER — Ambulatory Visit (INDEPENDENT_AMBULATORY_CARE_PROVIDER_SITE_OTHER): Payer: BLUE CROSS/BLUE SHIELD

## 2018-05-08 DIAGNOSIS — M25512 Pain in left shoulder: Secondary | ICD-10-CM

## 2018-05-08 MED ORDER — MELOXICAM 7.5 MG PO TABS: 7.5000 mg | ORAL_TABLET | Freq: Two times a day (BID) | ORAL | 2 refills | Status: DC | PRN

## 2018-05-08 NOTE — Progress Notes (Addendum)
Office Visit Note   Patient: Anthony Paul           Date of Birth: Apr 28, 1956           MRN: [id number] Visit Date: 05/08/2018              Requested by: [name], PA-C [address] [place], [place] [address] PCP: Unk Pinto, MD   Assessment & Plan: Visit Diagnoses:  1. Acute pain of left shoulder     Plan: Impression is left shoulder pain suspect biceps tendinosis superior labral degenerative tears.  Recommend intra-articular steroid injection and meloxicam as needed.  Reevaluate in 4 weeks to see his response from this.  If not better will need to get an MRI arthrogram to fully assess for this.  Follow-up in 4 weeks  Follow-Up Instructions: Return in about 1 month (around 06/05/2018).   Orders:  Orders Placed This Encounter  Procedures  . XR Shoulder Left  . Ambulatory referral to Physical Medicine Rehab   Meds ordered this encounter  Medications  . meloxicam (MOBIC) 7.5 MG tablet    Sig: Take 1 tablet (7.5 mg total) by mouth 2 (two) times daily as needed for pain.    Dispense:  30 tablet    Refill:  2      Procedures: No procedures performed   Clinical Data: No additional findings.   Subjective: Chief Complaint  Patient presents with  . Left Shoulder - Pain    Anthony Paul a 62 year old gentleman who comes in with intermittent sharp pains with raising his arm since May 13.  Denies any injuries.  He denies any numbness and tingling.  Denies any neck pain.  He takes hydrocodone at baseline for chronic abdominal pain.   Review of Systems  Constitutional: Negative.   All other systems reviewed and are negative.    Objective: Vital Signs: There were no vitals taken for this visit.  Physical Exam  Constitutional: He is oriented to person, place, and time. He appears well-developed and well-nourished.  HENT:  Head: Normocephalic and atraumatic.  Eyes: Pupils are equal, round, and reactive to light.  Neck: Neck supple.    Pulmonary/Chest: Effort normal.  Abdominal: Soft.  Musculoskeletal: Normal range of motion.  Neurological: He is alert and oriented to person, place, and time.  Skin: Skin is warm.  Psychiatric: He has a normal mood and affect. His behavior is normal. Judgment and thought content normal.  Nursing note and vitals reviewed.   Ortho Exam Shoulder exam is positive for O'Brien positive Speed positive crank positive impingement.  Rotator cuff is intact with very full strength with very mild pain.  Negative belly press.  Mildly positive cross adduction sign. Specialty Comments:  No specialty comments available.  Imaging: No results found. Left shoulder x-rays show bulky distal clavicle and AC joint arthropathy.  No other significant findings.  PMFS History: Patient Active Problem List   Diagnosis Date Noted  . Chest pain, atypical most c/w ibs  04/13/2018  . Cigarette smoker 04/13/2018  . Prediabetes 03/08/2015  . Vitamin D deficiency 03/08/2015  . Medication management 03/08/2015  . ED (erectile dysfunction)   . DJD (degenerative joint disease)   . Bicuspid aortic valve 06/27/2012  . Mixed hyperlipidemia 06/27/2012  . Borderline systolic HTN 25/36/6440  . COPD GOLD 0     Past Medical History:  Diagnosis Date  . Abnormal CT scan, chest    Blebs on CT chest but NORMAL PFTs- no COPD  .  Aortic stenosis   . Bicuspid aortic valve    Echo 2014  EF 55-60% Mild to moderate AR mild AS with mean gradient 18 mmHg and peak of 25 mmHg. ? Bicuspid valve with dilated aortic root 4.0 cm.  . Diverticulitis   . DJD (degenerative joint disease)   . ED (erectile dysfunction)   . Elevated blood pressure reading without diagnosis of hypertension   . Hypercholesteremia   . Skin cancer     Family History  Problem Relation Age of Onset  . Heart disease Father   . Benign prostatic hyperplasia Father     Past Surgical History:  Procedure Laterality Date  . COLON SURGERY  Jan 2012  .  COLONOSCOPY  01/13/2014   Dr. Earlean Shawl, due every 5-7 years  . HEMORROIDECTOMY    . INGUINAL HERNIA REPAIR Left 2015   Dr. Greer Pickerel  . SHOULDER SURGERY    . TONSILLECTOMY    . UPPER GI ENDOSCOPY  2015   Dr. Earlean Shawl   Social History   Occupational History  . Occupation: DRIVER    Employer: [employer]  Tobacco Use  . Smoking status: Current Every Day Smoker    Packs/day: 0.50    Types: Cigarettes    Last attempt to quit: 07/19/2013    Years since quitting: 4.8  . Smokeless tobacco: Former Systems developer    Types: Chew    Quit date: 12/19/2006  . Tobacco comment: Started smoking at age 45,maybe 1/2 ppd  Substance and Sexual Activity  . Alcohol use: Yes    Alcohol/week: 3.0 oz    Types: 5 Standard drinks or equivalent per week    Comment: 1-2 drinks per day  . Drug use: No  . Sexual activity: Not on file

## 2018-05-29 ENCOUNTER — Other Ambulatory Visit: Payer: Self-pay | Admitting: Internal Medicine

## 2018-05-30 ENCOUNTER — Encounter (INDEPENDENT_AMBULATORY_CARE_PROVIDER_SITE_OTHER): Payer: Self-pay | Admitting: Physical Medicine and Rehabilitation

## 2018-05-30 ENCOUNTER — Ambulatory Visit (INDEPENDENT_AMBULATORY_CARE_PROVIDER_SITE_OTHER): Payer: BLUE CROSS/BLUE SHIELD

## 2018-05-30 ENCOUNTER — Ambulatory Visit (INDEPENDENT_AMBULATORY_CARE_PROVIDER_SITE_OTHER): Payer: BLUE CROSS/BLUE SHIELD | Admitting: Physical Medicine and Rehabilitation

## 2018-05-30 DIAGNOSIS — M25512 Pain in left shoulder: Secondary | ICD-10-CM

## 2018-05-30 DIAGNOSIS — G8929 Other chronic pain: Secondary | ICD-10-CM | POA: Diagnosis not present

## 2018-05-30 NOTE — Progress Notes (Signed)
Anthony Paul - 62 y.o. male MRN [id number]  Date of birth: [birth year]  Office Visit Note: Visit Date: 05/30/2018 PCP: Unk Pinto, MD Referred by: Unk Pinto, MD  Subjective: Chief Complaint  Patient presents with  . Left Shoulder - Pain  . Left Arm - Pain   HPI: Anthony Paul  is a 62 year old gentleman with worsening left shoulder pain since the beginning of May.  He gets a lot of pain which is fairly sharp with movement including extension abduction and flexion and raising above his head.  Dr. Erlinda Hong request diagnostic note for therapeutic anesthetic arthrogram of the glenohumeral joint.   ROS Otherwise per HPI.  Assessment & Plan: Visit Diagnoses:  1. Chronic left shoulder pain     Plan: Findings:  Patient did have some relief during the anesthetic phase he had some increased range of motion but was still having some sharp pain at end ranges.    Meds & Orders: No orders of the defined types were placed in this encounter.   Orders Placed This Encounter  Procedures  . Large Joint Inj: L glenohumeral  . XR C-ARM NO REPORT    Follow-up: Return if symptoms worsen or fail to improve, for Dr. Erlinda Hong.   Procedures: Large Joint Inj: L glenohumeral on 05/30/2018 1:53 PM Indications: pain and diagnostic evaluation Details: 22 G 3.5 in needle, anteromedial approach  Arthrogram: Yes  Medications: 3 mL bupivacaine 0.5 %; 80 mg triamcinolone acetonide 40 MG/ML  Arthrogram demonstrated excellent flow of contrast throughout the joint surface without extravasation or obvious defect.  The patient had some relief of symptoms during the anesthetic phase of the injection with some increased range of motion but still with sharp pain at end ranges.  Procedure, treatment alternatives, risks and benefits explained, specific risks discussed. Consent was given by the patient. Immediately prior to procedure a time out was called to verify the correct patient, procedure, equipment, support staff  and site/side marked as required. Patient was prepped and draped in the usual sterile fashion.      No notes on file   Clinical History: No specialty comments available.   He reports that he has been smoking cigarettes.  He has been smoking about 0.50 packs per day. He quit smokeless tobacco use about 11 years ago. His smokeless tobacco use included chew. No results for input(s): HGBA1C, LABURIC in the last 8760 hours.  Objective:  VS:  HT:    WT:   BMI:     BP:   HR: bpm  TEMP: ( )  RESP:  Physical Exam  Ortho Exam Imaging: Xr C-arm No Report  Result Date: 05/30/2018 Please see Notes or Procedures tab for imaging impression.   Past Medical/Family/Surgical/Social History: Medications & Allergies reviewed per EMR, new medications updated. Patient Active Problem List   Diagnosis Date Noted  . Chest pain, atypical most c/w ibs  04/13/2018  . Cigarette smoker 04/13/2018  . Prediabetes 03/08/2015  . Vitamin D deficiency 03/08/2015  . Medication management 03/08/2015  . ED (erectile dysfunction)   . DJD (degenerative joint disease)   . Bicuspid aortic valve 06/27/2012  . Mixed hyperlipidemia 06/27/2012  . Borderline systolic HTN 42/35/3614  . COPD GOLD 0     Past Medical History:  Diagnosis Date  . Abnormal CT scan, chest    Blebs on CT chest but NORMAL PFTs- no COPD  . Aortic stenosis   . Bicuspid aortic valve    Echo 2014  EF 55-60% Mild to  moderate AR mild AS with mean gradient 18 mmHg and peak of 25 mmHg. ? Bicuspid valve with dilated aortic root 4.0 cm.  . diverticulosis   . DJD (degenerative joint disease)   . ED (erectile dysfunction)   . Elevated blood pressure reading without diagnosis of hypertension   . Hx of colonic polyp 2015  . Hypercholesteremia   . Skin cancer    Family History  Problem Relation Age of Onset  . Heart disease Father   . Benign prostatic hyperplasia Father   . Diabetes Mother    Past Surgical History:  Procedure Laterality Date    . COLON SURGERY  Jan 2012  . COLONOSCOPY  01/13/2014   Dr. Earlean Shawl, due every 5-7 years  . HEMORROIDECTOMY    . INGUINAL HERNIA REPAIR Left 2015   Dr. Greer Pickerel  . SHOULDER SURGERY    . TONSILLECTOMY    . UPPER GI ENDOSCOPY  2015   Dr. Earlean Shawl   Social History   Occupational History  . Occupation: DRIVER    Employer: [employer]  Tobacco Use  . Smoking status: Current Every Day Smoker    Packs/day: 0.50    Types: Cigarettes    Last attempt to quit: 07/19/2013    Years since quitting: 4.8  . Smokeless tobacco: Former Systems developer    Types: Chew    Quit date: 12/19/2006  . Tobacco comment: Started smoking at age 38,maybe 1/2 ppd  Substance and Sexual Activity  . Alcohol use: Yes    Alcohol/week: 3.0 oz    Types: 5 Standard drinks or equivalent per week    Comment: 1-2 drinks per day  . Drug use: No  . Sexual activity: Not on file

## 2018-05-30 NOTE — Patient Instructions (Signed)

## 2018-05-30 NOTE — Progress Notes (Signed)
 .  Numeric Pain Rating Scale and Functional Assessment Average Pain 6   In the last MONTH (on 0-10 scale) has pain interfered with the following?  1. General activity like being  able to carry out your everyday physical activities such as walking, climbing stairs, carrying groceries, or moving a chair?  Rating(4)    -Dye Allergies.  

## 2018-05-31 MED ORDER — TRIAMCINOLONE ACETONIDE 40 MG/ML IJ SUSP
80.0000 mg | INTRAMUSCULAR | Status: AC | PRN
  Administered 2018-05-30: 80 mg via INTRA_ARTICULAR

## 2018-05-31 MED ORDER — BUPIVACAINE HCL 0.5 % IJ SOLN
3.0000 mL | INTRAMUSCULAR | Status: AC | PRN
  Administered 2018-05-30: 3 mL via INTRA_ARTICULAR

## 2018-06-07 ENCOUNTER — Ambulatory Visit: Payer: BLUE CROSS/BLUE SHIELD | Admitting: Gastroenterology

## 2018-06-07 ENCOUNTER — Encounter: Payer: Self-pay | Admitting: Gastroenterology

## 2018-06-07 VITALS — BP 124/76 | HR 84 | Ht 73.0 in | Wt 230.4 lb

## 2018-06-07 DIAGNOSIS — Z86010 Personal history of colonic polyps: Secondary | ICD-10-CM | POA: Diagnosis not present

## 2018-06-07 DIAGNOSIS — R109 Unspecified abdominal pain: Secondary | ICD-10-CM

## 2018-06-07 DIAGNOSIS — G8929 Other chronic pain: Secondary | ICD-10-CM | POA: Diagnosis not present

## 2018-06-07 MED ORDER — DULOXETINE HCL 30 MG PO CPEP: ORAL_CAPSULE | ORAL | 1 refills | Status: DC

## 2018-06-07 NOTE — Patient Instructions (Signed)
If you are age 63 or older, your body mass index should be between 23-30. Your Body mass index is 30.39 kg/m. If this is out of the aforementioned range listed, please consider follow up with your Primary Care Provider.  If you are age 63 or younger, your body mass index should be between 19-25. Your Body mass index is 30.39 kg/m. If this is out of the aformentioned range listed, please consider follow up with your Primary Care Provider.   We have sent the following medications to your pharmacy for you to pick up at your convenience: Cymbalta: Take 1 tablet (30 mg) by mouth daily for 1 week, then take 2 tablets (60 mg) by mouth thereafter.  We have requested the pathology from your 2015 colonoscopy with Dr. Earlean Shawl.  We will contact you when we receive it and will let you know when you are due for a colonoscopy.  Thank you for entrusting me with your care and for choosing [facility], Dr. Lapel Cellar

## 2018-06-07 NOTE — Progress Notes (Signed)
HPI :  62 y/o male with a history of bicuspid aortic valve, diverticulosis, colon polyps, abdominal pain, referred by Unk Pinto, MD for further evaluation of chronic abdominal pain and to discuss next timing of his colonoscopy.   He reports ongoing abdominal pain for at least the past 5-6 years. The pain is underneath the bottom side of his lateral rib cage in his right upper quadrant and left upper quadrant. He states he has pain there all the time which never goes away. He denies any aggravating or alleviating factors. Eating does not change his pain. Having a bowel movement does not change his pain. He does have some occasional gas and bloating that bothers him. His bowel movements can be irregular sometimes but he has a bowel movement mostly every day. He denies any blood in stools. No rectal discomfort. He denies any back pain. He denies any weight loss. He denies any postprandial pain that radiates to his shoulder or back. He is taking hydrocodone chronically for his pain, takes roughly 30 mg a day which keeps his pain rated at about 5-10. He thinks he has tried gabapentin and Lyrica in the past without benefit. He is not sure of other regimens he's been on. He is followed by pain management but has never had a trigger point injection.  He's been evaluated by Dr. Earlie Raveling in the past. He had an upper endoscopy in 2015 for this pain which was normal. He had a CT scan in October 2015 which showed an anterior abdominal wall hernia, no other acute pathology, and small liver cysts. He had a follow-up MRI in November 2015 which again showed benign hepatic cysts and no concerning pathology. He had another CT scan in June 2016 which again showed stable liver cysts and no other acute pathology. He had an ultrasound of his abdomen in April of this year showing very small gallstones versus small gallbladder polyps. He had a CT scan of the chest this past April which did not show any acute  pathology.  He's had multiple polyps removed on his last colonoscopy in 2015, no pathology report available from that time. He did have a partial colectomy in 2002 due to diverticular disease.   Colonoscopy - Dr. Earlean Shawl - 01/13/2014 - 70mm cecal polyp, 64mm polyp, 19mm descending polyp, scattered diverticulosis,  EGD 2015 - Dr. Earlean Shawl - "normal"   Past Medical History:  Diagnosis Date  . Abnormal CT scan, chest    Blebs on CT chest but NORMAL PFTs- no COPD  . Aortic stenosis   . Bicuspid aortic valve    Echo 2014  EF 55-60% Mild to moderate AR mild AS with mean gradient 18 mmHg and peak of 25 mmHg. ? Bicuspid valve with dilated aortic root 4.0 cm.  . diverticulosis   . DJD (degenerative joint disease)   . ED (erectile dysfunction)   . Elevated blood pressure reading without diagnosis of hypertension   . Hx of colonic polyp 2015  . Hypercholesteremia   . Skin cancer      Past Surgical History:  Procedure Laterality Date  . COLON SURGERY  Jan 2012  . COLONOSCOPY  01/13/2014   Dr. Earlean Shawl, due every 5-7 years  . HEMORROIDECTOMY    . INGUINAL HERNIA REPAIR Left 2015   Dr. Greer Pickerel  . RESECTION DISTAL CLAVICAL Right   . TONSILLECTOMY    . UPPER GI ENDOSCOPY  2015   Dr. Earlean Shawl   Family History  Problem Relation Age of  Onset  . Heart disease Father   . Benign prostatic hyperplasia Father   . Diabetes Mother   . Kidney disease Sister   . Cancer Sister        in Spine   Social History   Tobacco Use  . Smoking status: Current Every Day Smoker    Packs/day: 0.50    Types: Cigarettes    Last attempt to quit: 07/19/2013    Years since quitting: 4.8  . Smokeless tobacco: Former Systems developer    Types: Chew    Quit date: 12/19/2006  . Tobacco comment: Started smoking at age 65,maybe 1/2 ppd  Substance Use Topics  . Alcohol use: Yes    Alcohol/week: 3.0 oz    Types: 5 Standard drinks or equivalent per week    Comment: 1-2 drinks per day  . Drug use: No   Current Outpatient  Medications  Medication Sig Dispense Refill  . acetaminophen (TYLENOL) 500 MG tablet Take 1,500 mg by mouth every 6 (six) hours as needed for mild pain.    Marland Kitchen aspirin 81 MG tablet Take 81 mg by mouth daily.     . Cholecalciferol (VITAMIN D) 2000 UNITS CAPS Take 1 capsule by mouth daily.     . fenofibrate micronized (LOFIBRA) 134 MG capsule TAKE ONE CAPSULE BY MOUTH EVERY DAY BEFORE BREAKFAST. 90 capsule 1  . Flaxseed, Linseed, (FLAXSEED OIL) 1200 MG CAPS Take 1 capsule by mouth daily.    Marland Kitchen HYDROcodone-acetaminophen (NORCO) 10-325 MG tablet Take 1 tablet by mouth 2 (two) times daily as needed.  0  . Omega-3 Fatty Acids (FISH OIL PO) Take 1 tablet by mouth daily.    . traZODone (DESYREL) 150 MG tablet TAKE 1/2-1 TABLET BY MOUTH AT BEDTIME AS NEEDED 90 tablet 0   No current facility-administered medications for this visit.    Allergies  Allergen Reactions  . Morphine Itching    Can take hydrocodone without issues     Review of Systems: All systems reviewed and negative except where noted in HPI.    Lab Results  Component Value Date   WBC 6.7 04/30/2018   HGB 15.6 04/30/2018   HCT 44.4 04/30/2018   MCV 89.9 04/30/2018   PLT 210 04/30/2018    Lab Results  Component Value Date   CREATININE 1.28 (H) 04/30/2018   BUN 16 04/30/2018   NA 138 04/30/2018   K 4.6 04/30/2018   CL 104 04/30/2018   CO2 25 04/30/2018    Lab Results  Component Value Date   ALT 17 04/30/2018   AST 16 04/30/2018   ALKPHOS 55 04/11/2017   BILITOT 0.5 04/30/2018     Physical Exam: BP 124/76 (BP Location: Left Arm, Patient Position: Sitting, Cuff Size: Normal)   Pulse 84   Ht 6\' 1"  (1.854 m) Comment: height measured without shoes  Wt 230 lb 6 oz (104.5 kg)   BMI 30.39 kg/m  Constitutional: Pleasant,well-developed, male in no acute distress. HEENT: Normocephalic and atraumatic. Conjunctivae are normal. No scleral icterus. Neck supple.  Cardiovascular: Normal rate, regular rhythm.   Pulmonary/chest: Effort normal and breath sounds normal. No wheezing, rales or rhonchi. Abdominal: Soft, nondistended, nontender. Negative Carnett sign - cannot reproduce symptoms. There are no masses palpable. No hepatomegaly. Extremities: no edema Lymphadenopathy: No cervical adenopathy noted. Neurological: Alert and oriented to person place and time. Skin: Skin is warm and dry. No rashes noted. Psychiatric: Normal mood and affect. Behavior is normal.   ASSESSMENT AND PLAN: 62 year old male with history  as outlined above, here for new patient assessment to discuss the following issues:  Chronic abdominal pain - extensive workup with multiple CT scans, MRI, endoscopy and colonoscopy, none of which have showed a clear etiology for his pain. While I cannot reproduce his pain on exam, I suspect he likely has musculoskeletal/neuropathic pain given the way he describes this. He does have very small gallstones versus very small gallbladder polyps on ultrasound, but his symptoms are not biliary colic and I doubt the source of his discomfort. I discussed abdominal wall pain with him and options for treatment. He has not had benefit with gabapentin or Lyrica in the past. I think a trial of Cymbalta is reasonable at this time for chronic pain management in hopes of weaning him off narcotics at some point. We'll try 30 mg once daily for a week and increase to 60 mg after that time if he tolerates it. Otherwise if no benefit with that we may consider setting him up for a trigger point injection to see if that helps. He wanted to hold off on a trigger point injection week course on Cymbalta for now. I reassured him that the hepatic cysts appeared to be small and stable and likely not causing his symptoms. He agreed with the plan.  History of colon polyps - he had one 10 mm polyp removed as well as 2 other polyps. I suspect he may be due for a surveillance colonoscopy at this time pending pathology shows these  are precancerous. We've reached out to Dr. Liliane Channel office to get pathology results. Once we have that back we'll schedule for colonoscopy pending this is needed. He agreed. If he doesn't hear back from [place] in a few weeks he should contact our office.   [place] Cellar, MD [facility]  CC: Unk Pinto, MD

## 2018-06-08 ENCOUNTER — Encounter (INDEPENDENT_AMBULATORY_CARE_PROVIDER_SITE_OTHER): Payer: Self-pay | Admitting: Physician Assistant

## 2018-06-08 ENCOUNTER — Ambulatory Visit (INDEPENDENT_AMBULATORY_CARE_PROVIDER_SITE_OTHER): Payer: BLUE CROSS/BLUE SHIELD | Admitting: Orthopaedic Surgery

## 2018-06-08 ENCOUNTER — Ambulatory Visit (INDEPENDENT_AMBULATORY_CARE_PROVIDER_SITE_OTHER): Payer: BLUE CROSS/BLUE SHIELD | Admitting: Physician Assistant

## 2018-06-08 DIAGNOSIS — M25512 Pain in left shoulder: Secondary | ICD-10-CM

## 2018-06-08 DIAGNOSIS — G8929 Other chronic pain: Secondary | ICD-10-CM | POA: Diagnosis not present

## 2018-06-08 NOTE — Progress Notes (Signed)
Patient is a pleasant 62 year old gentleman who presents to our clinic today for follow-up after left shoulder intra-articular cortisone injection by Dr. Ernestina Patches.  This was on 05/30/2018.  He had a questionable superior labral tear as well as biceps tendinosis when he was seen by Dr. Sherrian Divers on 05/08/2018.  He notes that the cortisone injection moderately helped, but he is still having pain with certain motions to include abduction and internal rotation.  He would like to give this another week or 2 before proceeding with an MRI.  If he is no better at that point, he will call and we will get an MRI arthrogram of the left shoulder.  He will otherwise follow-up with [place] as needed.

## 2018-06-12 ENCOUNTER — Encounter: Payer: Self-pay | Admitting: Internal Medicine

## 2018-06-12 ENCOUNTER — Ambulatory Visit: Payer: BLUE CROSS/BLUE SHIELD | Admitting: Internal Medicine

## 2018-06-12 VITALS — BP 118/74 | HR 80 | Ht 73.0 in | Wt 229.0 lb

## 2018-06-12 DIAGNOSIS — J449 Chronic obstructive pulmonary disease, unspecified: Secondary | ICD-10-CM

## 2018-06-12 DIAGNOSIS — F17210 Nicotine dependence, cigarettes, uncomplicated: Secondary | ICD-10-CM

## 2018-06-12 DIAGNOSIS — R0789 Other chest pain: Secondary | ICD-10-CM | POA: Diagnosis not present

## 2018-06-12 NOTE — Patient Instructions (Signed)
Although I don't endorse regular use of e cigs/ many pts find them helpful; however, I emphasized they should be considered a "one-way bridge" off all tobacco products.   Pulmonary follow up is as needed

## 2018-06-12 NOTE — Progress Notes (Signed)
Subjective:     Patient ID: Anthony Paul, male   DOB: Nov 18, 1956,    MRN: [id number]    Brief patient profile:  31 yowm active smoker with GOLD 0 as of 12/2013 pfts with good activity tolerance on no inhalers with unexplained migratory R > L cp x "years"  self referred back to pulmonary clinic 04/12/2018 - sees [facility] for primary     History of Present Illness  04/12/2018 1st [facility] office visit/ Salar Molden   Chief Complaint  Patient presents with  . Consult    COPD - saw DR. Joya Gaskins , feels like it has progressively gotten worse more SOB , some cough   doe indolent onset x 2017 = MMRC1 = can walk nl pace, flat grade, can't hurry or go uphills or steps s sob   Not much cough Cp started ? > 6  y prior to OV indolent onset much better   early first thing in am /better p flatus/ does not typically wake him - continuously aware esp sitting or bending over= worst with neg u/s of GI , not worse with ex  Or cough  S/p partial colectomy for divercular dz around 2013 [place] maybe he had the cp then not sure  rec Set a quit date and take the chantix 7 days before you quit and follow the directions and see your primary doctor or me to refill it at the end of the trial if you feel it helps Classic subdiaphragmatic pain pattern suggests ibs:  Marland Kitchen Treatment consists of avoiding foods that cause gas We will call you to schedule  the CT chest without contrast  And we will call you the results  We set up full pfts and call with those results      06/12/2018  f/u ov/Kandon Hosking re:  GOLD 0 copd/ still smking  Chief Complaint  Patient presents with  . Follow-up    Breathing is doing well and no longer coughing. No new co's.   Dyspnea:  Lots of steps if goes up too fast  Cough: no problem  SABA use: none 02: no   Prev eval by [facility] and GI doctor in [place] > f/u [facility]  [place] re chronic bilateral subdiaphragmatic pain ever since colon surgery, absent supine, better p flatus,  non-pleuritic, no worse with ex    No obvious day to day or daytime variability or assoc excess/ purulent sputum or mucus plugs or hemoptysis or cp or chest tightness, subjective wheeze or overt sinus or hb symptoms.   Sleeping flat  without nocturnal  or early am exacerbation  of respiratory  c/o's or need for noct saba. Also denies any obvious fluctuation of symptoms with weather or environmental changes or other aggravating or alleviating factors except as outlined above   No unusual exposure hx or h/o childhood pna/ asthma or knowledge of premature birth.  Current Allergies, Complete Past Medical History, Past Surgical History, Family History, and Social History were reviewed in [employer] record.  ROS  The following are not active complaints unless bolded Hoarseness, sore throat, dysphagia, dental problems, itching, sneezing,  nasal congestion or discharge of excess mucus or purulent secretions, ear ache,   fever, chills, sweats, unintended wt loss or wt gain, classically pleuritic or exertional cp,  orthopnea pnd or arm/hand swelling  or leg swelling, presyncope, palpitations, abdominal pain, anorexia, nausea, vomiting, diarrhea  or change in bowel habits or change in bladder habits, change in stools  or change in urine, dysuria, hematuria,  rash, arthralgias, visual complaints, headache, numbness, weakness or ataxia or problems with walking or coordination,  change in mood or  memory.        Current Meds  Medication Sig  . acetaminophen (TYLENOL) 500 MG tablet Take 1,500 mg by mouth every 6 (six) hours as needed for mild pain.  Marland Kitchen aspirin 81 MG tablet Take 81 mg by mouth daily.   . Cholecalciferol (VITAMIN D) 2000 UNITS CAPS Take 1 capsule by mouth daily.   . DULoxetine (CYMBALTA) 30 MG capsule Take 1 capsule (30 mg total) by mouth daily for 7 days, THEN 2 capsules (60 mg total) daily for 23 days.  . fenofibrate micronized (LOFIBRA) 134 MG capsule TAKE ONE CAPSULE BY  MOUTH EVERY DAY BEFORE BREAKFAST.  Marland Kitchen Flaxseed, Linseed, (FLAXSEED OIL) 1200 MG CAPS Take 1 capsule by mouth daily.  Marland Kitchen HYDROcodone-acetaminophen (NORCO) 10-325 MG tablet Take 1 tablet by mouth 2 (two) times daily as needed.  . Omega-3 Fatty Acids (FISH OIL PO) Take 1 tablet by mouth daily.  . traZODone (DESYREL) 150 MG tablet TAKE 1/2-1 TABLET BY MOUTH AT BEDTIME AS NEEDED                  Objective:   Physical Exam   amb somber wm nad   06/12/2018       229   04/12/18 234 lb (106.1 kg)  03/06/18 237 lb (107.5 kg)  10/24/17 231 lb 9.6 oz (105.1 kg)     Vital signs reviewed - Note on arrival 02 sats  98% on RA        HEENT: nl dentition, turbinates bilaterally, and oropharynx. Nl external ear canals without cough reflex   NECK :  without JVD/Nodes/TM/ nl carotid upstrokes bilaterally   LUNGS: no acc muscle use,  Nl contour chest which is clear to A and P bilaterally without cough on insp or exp maneuvers   CV:  RRR  no s3 or murmur or increase in P2, and no edema   ABD:  soft and nontender with nl inspiratory excursion in the supine position. No bruits or organomegaly appreciated, bowel sounds nl  MS:  Nl gait/ ext warm without deformities, calf tenderness, cyanosis or clubbing No obvious joint restrictions   SKIN: warm and dry without lesions    NEURO:  alert, approp, nl sensorium with  no motor or cerebellar deficits apparent.        Assessment:

## 2018-06-13 ENCOUNTER — Encounter: Payer: Self-pay | Admitting: Internal Medicine

## 2018-06-13 NOTE — Assessment & Plan Note (Signed)
4-5 min discussion re active cigarette smoking in addition to office E&M  Ask about tobacco use:   ongoing Advise quitting   > 3 min Discussed the risks and costs (both direct and indirect)  of smoking relative to the benefits of quitting but patient unwilling to commit at this point to a specific quit date.   Although I don't endorse regular use of e cigs/ many pts find them helpful; however, I emphasized they should be considered a "one-way bridge" off all tobacco products.  Assess willingness:  Not committed at this point Assist in quit attempt:  Per PCP when ready Arrange follow up:   Follow up per Primary Care planned

## 2018-06-13 NOTE — Assessment & Plan Note (Signed)
PFT's  12/23/13   FEV1 4.27 (105 % ) ratio 78  p 5 % improvement from saba p ? prior to study with DLCO  72 % corrects to 62  % for alv volume   - PFT's  04/25/18   FEV1 4.26 (109 % ) ratio 81  p 3 % improvement from saba p ? prior to study with DLCO  75 % corrects to 72 % for alv volume     No significant airflow obst , no tendency to aecopd so no need for meds or pulmonary f/u at this point, just work on smoking cessation

## 2018-06-13 NOTE — Assessment & Plan Note (Signed)
Onset around 2013? - trial of ibs rx/ [facility] 04/12/2018 > no better - CT chest  04/16/18 > nl   Reviewed CT in detail:  No nodules, no pleural thickening or effusion, no pleuritic quality to cp > no need for further pulmonary f/u > f/u [facility]

## 2018-06-19 ENCOUNTER — Telehealth: Payer: Self-pay | Admitting: Gastroenterology

## 2018-06-19 NOTE — Telephone Encounter (Signed)
Results received from prior colonoscopy:  Colonoscopy 12/2013 per Dr. Earlean Shawl - 3 polyps removed, largest 71mm in size - path shows sessile serrated polyps and an adenoma.   He is due for a colonoscopy at this time for surveillance, to be done at the [facility]. Almyra Free can you please contact him and help schedule this? Thanks

## 2018-06-29 ENCOUNTER — Other Ambulatory Visit: Payer: Self-pay | Admitting: Gastroenterology

## 2018-07-09 ENCOUNTER — Telehealth: Payer: Self-pay | Admitting: Gastroenterology

## 2018-07-09 NOTE — Telephone Encounter (Signed)
Spoke to patient and the Cymbalta is not helping he did increase to 60 mg daily with no relief. He currently sees a pain management doctor. He wanted to know about next suggestion.

## 2018-07-10 ENCOUNTER — Other Ambulatory Visit: Payer: Self-pay

## 2018-07-10 DIAGNOSIS — R109 Unspecified abdominal pain: Principal | ICD-10-CM

## 2018-07-10 DIAGNOSIS — G8929 Other chronic pain: Secondary | ICD-10-CM

## 2018-07-10 NOTE — Telephone Encounter (Signed)
Referral placed for Dr. Creig Hines to evaluate/treat chronic abdominal pain. Called and spoke to patient's wife to let her know that his office would contact them to schedule appointment.

## 2018-07-10 NOTE — Telephone Encounter (Signed)
Sorry to hear the medication has not helped. I think he may have neuropathic / abdominal wall pain based on my last evaluation. I think consideration for trigger point injection to the effected area is reasonable to see if that helps at all. If his current pain management physician offers that he can follow up with them. If not, we can refer him to Dr. Gardenia Phlegm of sports medicine for consideration of trigger point injection. I think it may be worth a try given he has failed multiple other regimens. Thanks

## 2018-07-31 NOTE — Progress Notes (Signed)
[facility] [address] [place], [place] [address] Phone: [phone] Subjective:    I'm seeing this patient by the request  of:  Unk Pinto, MD Armbruster MD  CC: Abdominal pain  CMK:[id number]  Anthony Paul is a 62 y.o. male coming in with complaint of abdominal pain. Pain in the upper quadrant. Does use hydrocodone to diminish pain. Has been in pain for 5 years. Worse in afternoons. Constant pain. No vomitting.     Patient states that this pain is been going on for 5 to 6 years.  Past medical history though is significant for diverticulitis that needed a surgery as well as a hernia repair, patient states that the pain seems more on the lateral rib cage bilaterally.  Seems to be than constant.  States he does feel that someone seems to be associated with bowels.  Has had bloating for quite some time.  Scheduled to have a colonoscopy in the near future.  Patient has been taking hydrocodone chronically for this pain 30 mg a day in total.  Has tried different medications including gabapentin and Lyrica without improvement.  Sent here for further evaluation of the possibility of trigger point injections.  Patient has been seen by pulmonology who felt that it was not secondary to any lung issues.   Abdominal ultrasound showed that there was several 1 to 2 mm echogenic foci of the gallbladder that was likely secondary to stones and small cyst of the liver. CT chest was this year as well and independently visualized by me.  Found to have atelectasis of the lower lobes bilaterally left greater than right.  Multiple small lesions in the liver consistent with cysts  Past Medical History:  Diagnosis Date  . Abnormal CT scan, chest    Blebs on CT chest but NORMAL PFTs- no COPD  . Aortic stenosis   . Bicuspid aortic valve    Echo 2014  EF 55-60% Mild to moderate AR mild AS with mean gradient 18 mmHg and peak of 25 mmHg. ? Bicuspid valve with dilated aortic root  4.0 cm.  . diverticulosis   . DJD (degenerative joint disease)   . ED (erectile dysfunction)   . Elevated blood pressure reading without diagnosis of hypertension   . Hx of colonic polyp 2015  . Hypercholesteremia   . Skin cancer    Past Surgical History:  Procedure Laterality Date  . COLON SURGERY  Jan 2012  . COLONOSCOPY  01/13/2014   Dr. Earlean Shawl, due every 5-7 years  . HEMORROIDECTOMY    . INGUINAL HERNIA REPAIR Left 2015   Dr. Greer Pickerel  . RESECTION DISTAL CLAVICAL Right   . TONSILLECTOMY    . UPPER GI ENDOSCOPY  2015   Dr. Earlean Shawl   Social History   Socioeconomic History  . Marital status: Married    Spouse name: Not on file  . Number of children: 3  . Years of education: Not on file  . Highest education level: Not on file  Occupational History  . Occupation: retired Building control surveyor: [employer]  Social Needs  . Financial resource strain: Not on file  . Food insecurity:    Worry: Not on file    Inability: Not on file  . Transportation needs:    Medical: Not on file    Non-medical: Not on file  Tobacco Use  . Smoking status: Current Every Day Smoker    Packs/day: 0.50    Types: Cigarettes  .  Smokeless tobacco: Former Systems developer    Types: Chew    Quit date: 12/19/2006  . Tobacco comment: Started smoking at age 20,maybe 1/2 ppd  Substance and Sexual Activity  . Alcohol use: Yes    Alcohol/week: 5.0 standard drinks    Types: 5 Standard drinks or equivalent per week    Comment: 1-2 drinks per day  . Drug use: No  . Sexual activity: Not on file  Lifestyle  . Physical activity:    Days per week: Not on file    Minutes per session: Not on file  . Stress: Not on file  Relationships  . Social connections:    Talks on phone: Not on file    Gets together: Not on file    Attends religious service: Not on file    Active member of club or organization: Not on file    Attends meetings of clubs or organizations: Not on file    Relationship status: Not on file  Other  Topics Concern  . Not on file  Social History Narrative  . Not on file   Allergies  Allergen Reactions  . Morphine Itching    Can take hydrocodone without issues   Family History  Problem Relation Age of Onset  . Heart disease Father   . Benign prostatic hyperplasia Father   . Diabetes Mother   . Kidney disease Sister   . Cancer Sister        in Spine     Past medical history, social, surgical and family history all reviewed in electronic medical record.  No pertanent information unless stated regarding to the chief complaint.   Review of Systems:Review of systems updated and as accurate as of 08/01/18  No headache, visual changes, nausea, vomiting, diarrhea, constipation, dizziness, skin rash, fevers, chills, night sweats, weight loss, swollen lymph nodes, body aches, joint swelling, chest pain, shortness of breath, mood changes.  Positive muscle aches, abdominal pain  Objective  Blood pressure 108/68, pulse 89, height 6\' 1"  (1.854 m), weight 232 lb (105.2 kg), SpO2 98 %. Systems examined below as of 08/01/18   General: No apparent distress alert and oriented x3 mood and affect normal, dressed appropriately.  HEENT: Pupils equal, extraocular movements intact  Respiratory: Patient's speak in full sentences and does not appear short of breath  Cardiovascular: No lower extremity edema, non tender, no erythema  Skin: Warm dry intact with no signs of infection or rash on extremities or on axial skeleton.  Abdomen: Soft with mild diffuse tenderness.  No rebounding or guarding.  No pain that seems to be more in the costal margin bilaterally Neuro: Cranial nerves II through XII are intact, neurovascularly intact in all extremities with 2+ DTRs and 2+ pulses.  Lymph: No lymphadenopathy of posterior or anterior cervical chain or axillae bilaterally.  Gait normal with good balance and coordination.  MSK:  Non tender with full range of motion and good stability and symmetric strength and  tone of shoulders, elbows, wrist, hip, knee and ankles bilaterally.   Limited musculoskeletal ultrasound was performed and interpreted by Lyndal Pulley  Ultrasound of patient's musculature around the costal margin did not show anything significant except for some very mild enlargement some nerves with some soft tissue but nothing specific.  No hernias appreciated in the upper quadrants. Impression: Nonspecific nerve irritation of the soft tissue near the costal margin where patient has discomfort but otherwise nothing unremarkable.     Impression and Recommendations:     This  case required medical decision making of moderate complexity.      Note: This dictation was prepared with Dragon dictation along with smaller phrase technology. Any transcriptional errors that result from this process are unintentional.

## 2018-08-01 ENCOUNTER — Encounter: Payer: Self-pay | Admitting: Family Medicine

## 2018-08-01 ENCOUNTER — Ambulatory Visit: Payer: Self-pay

## 2018-08-01 ENCOUNTER — Ambulatory Visit: Payer: BLUE CROSS/BLUE SHIELD | Admitting: Family Medicine

## 2018-08-01 VITALS — BP 108/68 | HR 89 | Ht 73.0 in | Wt 232.0 lb

## 2018-08-01 DIAGNOSIS — R109 Unspecified abdominal pain: Secondary | ICD-10-CM

## 2018-08-01 DIAGNOSIS — R1084 Generalized abdominal pain: Secondary | ICD-10-CM

## 2018-08-01 MED ORDER — MONTELUKAST SODIUM 10 MG PO TABS: 10.0000 mg | ORAL_TABLET | Freq: Every day | ORAL | 3 refills | Status: DC

## 2018-08-01 NOTE — Patient Instructions (Signed)
God to see you  I am sorry I was running late Singulair daily  Inhalation daily as well  Lets see what the colonoscopy shows as well  See me again in 4 weeks

## 2018-08-01 NOTE — Assessment & Plan Note (Addendum)
.    Nonspecific.  He did not see anything very specific to do the injections at this time but will consider trigger points if patient fails other conservative therapy.  Patient has actually possible irritation secondary to the mesh in the abdominal area and patient's previous surgeries.  Singular given today.  Patient given a trial of a inhaled steroid.  Patient is going to try this at this moment.  Encourage patient to follow-up with the colonoscopy.  Follow-up again in 4 weeks

## 2018-08-02 ENCOUNTER — Other Ambulatory Visit: Payer: Self-pay | Admitting: Internal Medicine

## 2018-08-06 ENCOUNTER — Ambulatory Visit (AMBULATORY_SURGERY_CENTER): Payer: Self-pay

## 2018-08-06 ENCOUNTER — Encounter: Payer: Self-pay | Admitting: Gastroenterology

## 2018-08-06 VITALS — Ht 74.0 in | Wt 233.8 lb

## 2018-08-06 DIAGNOSIS — Z86010 Personal history of colonic polyps: Secondary | ICD-10-CM

## 2018-08-06 MED ORDER — NA SULFATE-K SULFATE-MG SULF 17.5-3.13-1.6 GM/177ML PO SOLN: 1.0000 | Freq: Once | ORAL | 0 refills | Status: AC

## 2018-08-06 NOTE — Progress Notes (Signed)
Denies allergies to eggs or soy products. Denies complication of anesthesia or sedation. Denies use of weight loss medication. Denies use of O2.   Emmi instructions declined.   A 50.00 coupon given to patient.

## 2018-08-16 ENCOUNTER — Ambulatory Visit (AMBULATORY_SURGERY_CENTER): Payer: BLUE CROSS/BLUE SHIELD | Admitting: Gastroenterology

## 2018-08-16 ENCOUNTER — Encounter: Payer: Self-pay | Admitting: Gastroenterology

## 2018-08-16 VITALS — BP 101/81 | HR 63 | Temp 97.8°F | Resp 17 | Ht 74.0 in | Wt 233.0 lb

## 2018-08-16 DIAGNOSIS — D128 Benign neoplasm of rectum: Secondary | ICD-10-CM | POA: Diagnosis not present

## 2018-08-16 DIAGNOSIS — D123 Benign neoplasm of transverse colon: Secondary | ICD-10-CM

## 2018-08-16 DIAGNOSIS — D125 Benign neoplasm of sigmoid colon: Secondary | ICD-10-CM | POA: Diagnosis not present

## 2018-08-16 DIAGNOSIS — Z86010 Personal history of colonic polyps: Secondary | ICD-10-CM | POA: Diagnosis present

## 2018-08-16 DIAGNOSIS — D120 Benign neoplasm of cecum: Secondary | ICD-10-CM

## 2018-08-16 DIAGNOSIS — K635 Polyp of colon: Secondary | ICD-10-CM

## 2018-08-16 MED ORDER — SODIUM CHLORIDE 0.9 % IV SOLN: 500.0000 mL | Freq: Once | INTRAVENOUS | Status: DC

## 2018-08-16 NOTE — Progress Notes (Signed)
A/ox3 pleased with MAC, report to RN 

## 2018-08-16 NOTE — Patient Instructions (Signed)
Handout given on polyps  YOU HAD AN ENDOSCOPIC PROCEDURE TODAY AT THE  ENDOSCOPY CENTER:   Refer to the procedure report that was given to you for any specific questions about what was found during the examination.  If the procedure report does not answer your questions, please call your gastroenterologist to clarify.  If you requested that your care partner not be given the details of your procedure findings, then the procedure report has been included in a sealed envelope for you to review at your convenience later.  YOU SHOULD EXPECT: Some feelings of bloating in the abdomen. Passage of more gas than usual.  Walking can help get rid of the air that was put into your GI tract during the procedure and reduce the bloating. If you had a lower endoscopy (such as a colonoscopy or flexible sigmoidoscopy) you may notice spotting of blood in your stool or on the toilet paper. If you underwent a bowel prep for your procedure, you may not have a normal bowel movement for a few days.  Please Note:  You might notice some irritation and congestion in your nose or some drainage.  This is from the oxygen used during your procedure.  There is no need for concern and it should clear up in a day or so.  SYMPTOMS TO REPORT IMMEDIATELY:   Following lower endoscopy (colonoscopy or flexible sigmoidoscopy):  Excessive amounts of blood in the stool  Significant tenderness or worsening of abdominal pains  Swelling of the abdomen that is new, acute  Fever of 100F or higher    For urgent or emergent issues, a gastroenterologist can be reached at any hour by calling [phone].   DIET:  We do recommend a small meal at first, but then you may proceed to your regular diet.  Drink plenty of fluids but you should avoid alcoholic beverages for 24 hours.  ACTIVITY:  You should plan to take it easy for the rest of today and you should NOT DRIVE or use heavy machinery until tomorrow (because of the sedation  medicines used during the test).    FOLLOW UP: Our staff will call the number listed on your records the next business day following your procedure to check on you and address any questions or concerns that you may have regarding the information given to you following your procedure. If we do not reach you, we will leave a message.  However, if you are feeling well and you are not experiencing any problems, there is no need to return our call.  We will assume that you have returned to your regular daily activities without incident.  If any biopsies were taken you will be contacted by phone or by letter within the next 1-3 weeks.  Please call us at [phone] if you have not heard about the biopsies in 3 weeks.    SIGNATURES/CONFIDENTIALITY: You and/or your care partner have signed paperwork which will be entered into your electronic medical record.  These signatures attest to the fact that that the information above on your After Visit Summary has been reviewed and is understood.  Full responsibility of the confidentiality of this discharge information lies with you and/or your care-partner. 

## 2018-08-16 NOTE — Op Note (Signed)
[facility] Patient Name: [facility] Procedure Date: 08/16/2018 3:34 PM MRN: [id number] Endoscopist: Remo Lipps P. Havery Moros , MD Age: 62 Referring MD:  Date of Birth: Nov 17, 1956 Gender: Male Account #: [id number] Procedure:                Colonoscopy Indications:              High risk colon cancer surveillance: Personal                            history of colonic polyps Medicines:                Monitored Anesthesia Care Procedure:                Pre-Anesthesia Assessment:                           - Prior to the procedure, a History and Physical                            was performed, and patient medications and                            allergies were reviewed. The patient's tolerance of                            previous anesthesia was also reviewed. The risks                            and benefits of the procedure and the sedation                            options and risks were discussed with the patient.                            All questions were answered, and informed consent                            was obtained. Prior Anticoagulants: The patient has                            taken no previous anticoagulant or antiplatelet                            agents. ASA Grade Assessment: III - A patient with                            severe systemic disease. After reviewing the risks                            and benefits, the patient was deemed in                            satisfactory condition to undergo the procedure.  After obtaining informed consent, the colonoscope                            was passed under direct vision. Throughout the                            procedure, the patient's blood pressure, pulse, and                            oxygen saturations were monitored continuously. The                            Model PCF-H190DL [id number]) scope was introduced                            through the anus and advanced to  the the cecum,                            identified by appendiceal orifice and ileocecal                            valve. The colonoscopy was performed without                            difficulty. The patient tolerated the procedure                            well. The quality of the bowel preparation was                            fair. The ileocecal valve, appendiceal orifice, and                            rectum were photographed. Scope In: 3:47:50 PM Scope Out: 4:07:56 PM Scope Withdrawal Time: 0 hours 15 minutes 50 seconds  Total Procedure Duration: 0 hours 20 minutes 6 seconds  Findings:                 The perianal and digital rectal examinations were                            normal.                           A 4 mm polyp was found in the cecum. The polyp was                            sessile. The polyp was removed with a cold snare.                            Resection and retrieval were complete.                           Three sessile polyps were found in the transverse  colon. The polyps were 4 to 5 mm in size. These                            polyps were removed with a cold snare. Resection                            and retrieval were complete.                           Two sessile polyps were found in the sigmoid colon.                            The polyps were 3 to 5 mm in size. These polyps                            were removed with a cold snare. Resection and                            retrieval were complete.                           A 3 mm polyp was found in the rectum. The polyp was                            sessile. The polyp was removed with a cold snare.                            Resection and retrieval were complete.                           Multiple medium-mouthed diverticula were found in                            the transverse colon and left colon.                           Internal hemorrhoids were found during                             retroflexion. The hemorrhoids were small. There was                            scarring from prior hemorrhoidectomy                           A large amount of liquid stool was found in the                            entire colon, making visualization difficult,                            particularly in the right colon. Lavage of the area  was performed using copious amounts of sterile                            water, resulting in incomplete clearance with fair                            visualization. Adherent stool in the right colon                            was difficult to wash off the colon wall.                           The exam was otherwise without abnormality. Complications:            No immediate complications. Estimated blood loss:                            Minimal. Estimated Blood Loss:     Estimated blood loss was minimal. Impression:               - Preparation of the colon was fair.                           - One 4 mm polyp in the cecum, removed with a cold                            snare. Resected and retrieved.                           - Three 4 to 5 mm polyps in the transverse colon,                            removed with a cold snare. Resected and retrieved.                           - Two 3 to 5 mm polyps in the sigmoid colon,                            removed with a cold snare. Resected and retrieved.                           - One 3 mm polyp in the rectum, removed with a cold                            snare. Resected and retrieved.                           - Diverticulosis in the transverse colon and in the                            left colon.                           - Internal hemorrhoids.                           -  The examination was otherwise normal. Recommendation:           - Patient has a contact number available for                            emergencies. The signs and symptoms of potential                             delayed complications were discussed with the                            patient. Return to normal activities tomorrow.                            Written discharge instructions were provided to the                            patient.                           - Resume previous diet.                           - Continue present medications.                           - Await pathology results.                           - Repeat colonoscopy because the bowel preparation                            was suboptimal, within a year or so. Remo Lipps P. Shealeigh Dunstan, MD 08/16/2018 5:41:19 PM This report has been signed electronically.

## 2018-08-16 NOTE — Progress Notes (Signed)
Called to room to assist during endoscopic procedure.  Patient ID and intended procedure confirmed with present staff. Received instructions for my participation in the procedure from the performing physician.  

## 2018-08-21 ENCOUNTER — Telehealth: Payer: Self-pay

## 2018-08-21 NOTE — Telephone Encounter (Signed)
  Follow up Call-  Call back number 08/16/2018  Post procedure Call Back phone  # [phone]  Permission to leave phone message Yes  Some recent data might be hidden     Patient questions:  Do you have a fever, pain , or abdominal swelling? No. Pain Score  0 *  Have you tolerated food without any problems? Yes.    Have you been able to return to your normal activities? Yes.    Do you have any questions about your discharge instructions: Diet   No. Medications  No. Follow up visit  No.  Do you have questions or concerns about your Care? No.  Actions: * If pain score is 4 or above: No action needed, pain <4.

## 2018-08-21 NOTE — Telephone Encounter (Signed)
Left message on answering machine. 

## 2018-08-28 NOTE — Progress Notes (Signed)
[name] [address] [place], [place] [address] Phone: [phone] Subjective:    I Anthony Paul am serving as a Education administrator for Dr. Hulan Saas.   I'm seeing this patient by the request  of:    CC: Abdominal wall pain follow-up  [id number]  Anthony Paul is a 62 y.o. male coming in with complaint of abdominal wall pain. States that he feels the same as last visit. Patient has been treated with significant number of gastrointestinal pathology with no significant improvement.  Has had surgery previously multiple times.  Attempted to treat if there is any pulmonary aspect with some Combivent.  Did not notice any significant improvement.  Still has a dull aching pain on the costal margin bilaterally seems to be right greater than left.  Patient denies any swelling.  States that it is sometimes debilitating.     Past Medical History:  Diagnosis Date  . Abnormal CT scan, chest    Blebs on CT chest but NORMAL PFTs- no COPD  . Allergy   . Aortic stenosis   . Bicuspid aortic valve    Echo 2014  EF 55-60% Mild to moderate AR mild AS with mean gradient 18 mmHg and peak of 25 mmHg. ? Bicuspid valve with dilated aortic root 4.0 cm.  . diverticulosis   . DJD (degenerative joint disease)   . ED (erectile dysfunction)   . Elevated blood pressure reading without diagnosis of hypertension   . Heart murmur   . Hx of colonic polyp 2015  . Hypercholesteremia   . Skin cancer    Patient denies    Past Surgical History:  Procedure Laterality Date  . COLON SURGERY  Jan 2012  . COLONOSCOPY  01/13/2014   Dr. Earlean Shawl, due every 5-7 years  . HEMORROIDECTOMY    . INGUINAL HERNIA REPAIR Left 2015   Dr. Greer Pickerel  . RESECTION DISTAL CLAVICAL Right   . TONSILLECTOMY    . UPPER GI ENDOSCOPY  2015   Dr. Earlean Shawl   Social History   Socioeconomic History  . Marital status: Married    Spouse name: Not on file  . Number of children: 3  . Years of education: Not  on file  . Highest education level: Not on file  Occupational History  . Occupation: retired Building control surveyor: [employer]  Social Needs  . Financial resource strain: Not on file  . Food insecurity:    Worry: Not on file    Inability: Not on file  . Transportation needs:    Medical: Not on file    Non-medical: Not on file  Tobacco Use  . Smoking status: Current Every Day Smoker    Packs/day: 0.50    Types: Cigarettes  . Smokeless tobacco: Former Systems developer    Types: Chew    Quit date: 12/19/2006  . Tobacco comment: Started smoking at age 15,maybe 1/2 ppd  Substance and Sexual Activity  . Alcohol use: Yes    Alcohol/week: 5.0 standard drinks    Types: 5 Standard drinks or equivalent per week    Comment: 1-2 drinks per day  . Drug use: No  . Sexual activity: Not on file  Lifestyle  . Physical activity:    Days per week: Not on file    Minutes per session: Not on file  . Stress: Not on file  Relationships  . Social connections:    Talks on phone: Not on file    Gets  together: Not on file    Attends religious service: Not on file    Active member of club or organization: Not on file    Attends meetings of clubs or organizations: Not on file    Relationship status: Not on file  Other Topics Concern  . Not on file  Social History Narrative  . Not on file   Allergies  Allergen Reactions  . Morphine Itching    Can take hydrocodone without issues   Family History  Problem Relation Age of Onset  . Heart disease Father   . Benign prostatic hyperplasia Father   . Diabetes Mother   . Kidney disease Sister   . Cancer Sister        in Spine  . Colon cancer Neg Hx   . Esophageal cancer Neg Hx   . Stomach cancer Neg Hx   . Rectal cancer Neg Hx      Current Outpatient Medications (Cardiovascular):  .  fenofibrate micronized (LOFIBRA) 134 MG capsule, TAKE ONE CAPSULE BY MOUTH EVERY DAY BEFORE BREAKFAST.  Current Outpatient Medications (Respiratory):  .  montelukast  (SINGULAIR) 10 MG tablet, Take 1 tablet (10 mg total) by mouth at bedtime.  Current Outpatient Medications (Analgesics):  .  acetaminophen (TYLENOL) 500 MG tablet, Take 1,500 mg by mouth every 6 (six) hours as needed for mild pain. Marland Kitchen  aspirin 81 MG tablet, Take 81 mg by mouth daily.  Marland Kitchen  HYDROcodone-acetaminophen (NORCO) 10-325 MG tablet, Take 1 tablet by mouth 2 (two) times daily as needed.   Current Outpatient Medications (Other):  Marland Kitchen  Cholecalciferol (VITAMIN D) 2000 UNITS CAPS, Take 1 capsule by mouth daily.  .  Flaxseed, Linseed, (FLAXSEED OIL) 1200 MG CAPS, Take 1 capsule by mouth daily. .  Omega-3 Fatty Acids (FISH OIL PO), Take 1 tablet by mouth daily. .  traZODone (DESYREL) 150 MG tablet, TAKE 1/2-1 TABLET BY MOUTH AT BEDTIME AS NEEDED    Past medical history, social, surgical and family history all reviewed in electronic medical record.  No pertanent information unless stated regarding to the chief complaint.   Review of Systems:  No headache, visual changes, nausea, vomiting, diarrhea, constipation, dizziness, abdominal pain, skin rash, fevers, chills, night sweats, weight loss, swollen lymph nodes, body aches, joint swelling, muscle aches, chest pain, shortness of breath, mood changes.  Positive abdominal pain  Objective  Blood pressure 102/70, pulse 80, height 6\' 2"  (1.88 m), weight 230 lb (104.3 kg), SpO2 95 %.    General: No apparent distress alert and oriented x3 mood and affect normal, dressed appropriately.  HEENT: Pupils equal, extraocular movements intact  Respiratory: Patient's speak in full sentences and does not appear short of breath  Cardiovascular: No lower extremity edema, non tender, no erythema  Skin: Warm dry intact with no signs of infection or rash on extremities or on axial skeleton.  Abdomen: Soft tender to palpation mostly over the costochondral junction near the sternum.  Patient also has pain in the right side costal margin.  Do not palpate any  enlargement of the liver Neuro: Cranial nerves II through XII are intact, neurovascularly intact in all extremities with 2+ DTRs and 2+ pulses.  Lymph: No lymphadenopathy of posterior or anterior cervical chain or axillae bilaterally.  Gait normal with good balance and coordination.  MSK:  Non tender with full range of motion and good stability and symmetric strength and tone of shoulders, elbows, wrist, hip, knee and ankles bilaterally.   Procedure: Real-time Ultrasound Guided Injection  of abdominal wall trigger points Device: GE Logiq Q7 Ultrasound guided injection is preferred based studies that show increased duration, increased effect, greater accuracy, decreased procedural pain, increased response rate, and decreased cost with ultrasound guided versus blind injection.  Verbal informed consent obtained.  Time-out conducted.  Noted no overlying erythema, induration, or other signs of local infection.  Skin prepped in a sterile fashion.  Local anesthesia: Topical Ethyl chloride.  With sterile technique and under real time ultrasound guidance: With a 21-gauge 2 inch needle was injected and 4 distinct trigger points in the abdominal wall.  A total of 3 cc of 0.5% Marcaine and 1 cc of Kenalog 40 mg/mL used Completed without difficulty  Pain immediately resolved suggesting accurate placement of the medication.  Advised to call if fevers/chills, erythema, induration, drainage, or persistent bleeding.  Images permanently stored and available for review in the ultrasound unit.  Impression: Technically successful ultrasound guided injection.    Impression and Recommendations:     This case required medical decision making of moderate complexity. The above documentation has been reviewed and is accurate and complete Lyndal Pulley, DO       Note: This dictation was prepared with Dragon dictation along with smaller phrase technology. Any transcriptional errors that result from this process  are unintentional.

## 2018-08-29 ENCOUNTER — Encounter: Payer: Self-pay | Admitting: Family Medicine

## 2018-08-29 ENCOUNTER — Ambulatory Visit: Payer: BLUE CROSS/BLUE SHIELD | Admitting: Family Medicine

## 2018-08-29 ENCOUNTER — Ambulatory Visit: Payer: Self-pay

## 2018-08-29 VITALS — BP 102/70 | HR 80 | Ht 74.0 in | Wt 230.0 lb

## 2018-08-29 DIAGNOSIS — R109 Unspecified abdominal pain: Secondary | ICD-10-CM | POA: Diagnosis not present

## 2018-08-29 NOTE — Assessment & Plan Note (Signed)
Right-sided injected.  Patient tolerated the procedure well.  No significant change in medicine.  Has not made significant changes else.  No improvement overall low.  Has had significant work-up including EGD, colonoscopy, patient did not seem to respond well to the other medications.  Follow-up with me again in 4 weeks admitted to the contralateral side if improving.

## 2018-08-29 NOTE — Patient Instructions (Signed)
Good to see you  Ice is your friend Stay active Lets see how the injections do  See me again in 3-4 weeks and may do the next one.

## 2018-08-30 ENCOUNTER — Other Ambulatory Visit: Payer: Self-pay | Admitting: *Deleted

## 2018-08-30 MED ORDER — TRAZODONE HCL 150 MG PO TABS: ORAL_TABLET | ORAL | 0 refills | Status: DC

## 2018-09-24 NOTE — Progress Notes (Signed)
[facility] [place], [place] [address] Phone: [phone] Subjective:   [name], am serving as a scribe for Dr. Hulan Saas.  CC: Abdominal pain follow-up  [id number]  Anthony Paul is a 62 y.o. male coming in with complaint of abdominal pain. Had injection last visit. No better following the injection. No improvement. Still same pain.  Still having the discomfort at all time.  Never without at least discomfort.        Past Medical History:  Diagnosis Date  . Abnormal CT scan, chest    Blebs on CT chest but NORMAL PFTs- no COPD  . Allergy   . Aortic stenosis   . Bicuspid aortic valve    Echo 2014  EF 55-60% Mild to moderate AR mild AS with mean gradient 18 mmHg and peak of 25 mmHg. ? Bicuspid valve with dilated aortic root 4.0 cm.  . diverticulosis   . DJD (degenerative joint disease)   . ED (erectile dysfunction)   . Elevated blood pressure reading without diagnosis of hypertension   . Heart murmur   . Hx of colonic polyp 2015  . Hypercholesteremia   . Skin cancer    Patient denies    Past Surgical History:  Procedure Laterality Date  . COLON SURGERY  Jan 2012  . COLONOSCOPY  01/13/2014   Dr. Earlean Shawl, due every 5-7 years  . HEMORROIDECTOMY    . INGUINAL HERNIA REPAIR Left 2015   Dr. Greer Pickerel  . RESECTION DISTAL CLAVICAL Right   . TONSILLECTOMY    . UPPER GI ENDOSCOPY  2015   Dr. Earlean Shawl   Social History   Socioeconomic History  . Marital status: Married    Spouse name: Not on file  . Number of children: 3  . Years of education: Not on file  . Highest education level: Not on file  Occupational History  . Occupation: retired Building control surveyor: [employer]  Social Needs  . Financial resource strain: Not on file  . Food insecurity:    Worry: Not on file    Inability: Not on file  . Transportation needs:    Medical: Not on file    Non-medical: Not on file  Tobacco Use  . Smoking status: Current  Every Day Smoker    Packs/day: 0.50    Types: Cigarettes  . Smokeless tobacco: Former Systems developer    Types: Chew    Quit date: 12/19/2006  . Tobacco comment: Started smoking at age 54,maybe 1/2 ppd  Substance and Sexual Activity  . Alcohol use: Yes    Alcohol/week: 5.0 standard drinks    Types: 5 Standard drinks or equivalent per week    Comment: 1-2 drinks per day  . Drug use: No  . Sexual activity: Not on file  Lifestyle  . Physical activity:    Days per week: Not on file    Minutes per session: Not on file  . Stress: Not on file  Relationships  . Social connections:    Talks on phone: Not on file    Gets together: Not on file    Attends religious service: Not on file    Active member of club or organization: Not on file    Attends meetings of clubs or organizations: Not on file    Relationship status: Not on file  Other Topics Concern  . Not on file  Social History Narrative  . Not on file   Allergies  Allergen Reactions  . Morphine Itching    Can take hydrocodone without issues   Family History  Problem Relation Age of Onset  . Heart disease Father   . Benign prostatic hyperplasia Father   . Diabetes Mother   . Kidney disease Sister   . Cancer Sister        in Spine  . Colon cancer Neg Hx   . Esophageal cancer Neg Hx   . Stomach cancer Neg Hx   . Rectal cancer Neg Hx      Current Outpatient Medications (Cardiovascular):  .  fenofibrate micronized (LOFIBRA) 134 MG capsule, TAKE ONE CAPSULE BY MOUTH EVERY DAY BEFORE BREAKFAST.  Current Outpatient Medications (Respiratory):  .  montelukast (SINGULAIR) 10 MG tablet, Take 1 tablet (10 mg total) by mouth at bedtime.  Current Outpatient Medications (Analgesics):  .  acetaminophen (TYLENOL) 500 MG tablet, Take 1,500 mg by mouth every 6 (six) hours as needed for mild pain. Marland Kitchen  aspirin 81 MG tablet, Take 81 mg by mouth daily.    Current Outpatient Medications (Other):  Marland Kitchen  Cholecalciferol (VITAMIN D) 2000 UNITS CAPS,  Take 1 capsule by mouth daily.  .  Flaxseed, Linseed, (FLAXSEED OIL) 1200 MG CAPS, Take 1 capsule by mouth daily. .  Omega-3 Fatty Acids (FISH OIL PO), Take 1 tablet by mouth daily. .  traZODone (DESYREL) 150 MG tablet, TAKE 1/2-1 TABLET BY MOUTH AT BEDTIME AS NEEDED    Past medical history, social, surgical and family history all reviewed in electronic medical record.  No pertanent information unless stated regarding to the chief complaint.   Review of Systems:  No headache, visual changes, nausea, vomiting, diarrhea, constipation, dizziness, skin rash, fevers, chills, night sweats, weight loss, swollen lymph nodes, body aches, joint swelling, muscle aches, chest pain, shortness of breath, mood changes.  Positive abdominal pain  Objective  Blood pressure 104/64, pulse 76, height 6\' 2"  (1.88 m), weight 229 lb (103.9 kg), SpO2 98 %.    General: No apparent distress alert and oriented x3 mood and affect normal, dressed appropriately.  HEENT: Pupils equal, extraocular movements intact  Respiratory: Patient's speak in full sentences and does not appear short of breath  Cardiovascular: No lower extremity edema, non tender, no erythema  Skin: Warm dry intact with no signs of infection or rash on extremities or on axial skeleton.  Abdomen: Soft tender in the costal margin Neuro: Cranial nerves II through XII are intact, neurovascularly intact in all extremities with 2+ DTRs and 2+ pulses.  Lymph: No lymphadenopathy of posterior or anterior cervical chain or axillae bilaterally.  Gait normal with good balance and coordination.  MSK:  Non tender with full range of motion and good stability and symmetric strength and tone of shoulders, elbows, wrist, hip, knee and ankles bilaterally.       Impression and Recommendations:     This case required medical decision making of moderate complexity. The above documentation has been reviewed and is accurate and complete Lyndal Pulley, DO        Note: This dictation was prepared with Dragon dictation along with smaller phrase technology. Any transcriptional errors that result from this process are unintentional.

## 2018-09-26 ENCOUNTER — Ambulatory Visit: Payer: BLUE CROSS/BLUE SHIELD | Admitting: Family Medicine

## 2018-09-26 ENCOUNTER — Encounter: Payer: Self-pay | Admitting: Family Medicine

## 2018-09-26 DIAGNOSIS — R1084 Generalized abdominal pain: Secondary | ICD-10-CM | POA: Diagnosis not present

## 2018-09-26 NOTE — Assessment & Plan Note (Signed)
With patient not improving with the abdominal wall injections do not think that this is musculoskeletal in nature.  We discussed the possible for diaphragmatic irritation.  Trial of Symbicort to low-dose given today.  Warned of potential side effects.  Will try 1 inhalation twice a day and see if there be any improvement.  If continuing to have pain difficult to see which way to go.  Have reviewed patient's chart in the entirety including the most recent CT of the chest and abdomen.  Does have some liver cysts but do not seem large enough to be causing significant irritation though.  Patient has been worked up by GI and pulmonary as well.  Very difficult case overall.

## 2018-09-26 NOTE — Patient Instructions (Addendum)
I  Sorry not better  Try the inhaler 2 times a day for next month.   This is  A low dose and may need to try a little higher dose.  Send me a message in 3 weeks and tell me how it is doing.

## 2018-10-18 ENCOUNTER — Other Ambulatory Visit: Payer: Self-pay

## 2018-10-18 MED ORDER — FENOFIBRATE MICRONIZED 134 MG PO CAPS: ORAL_CAPSULE | ORAL | 1 refills | Status: DC

## 2018-10-22 ENCOUNTER — Other Ambulatory Visit: Payer: Self-pay

## 2018-10-22 ENCOUNTER — Telehealth: Payer: Self-pay

## 2018-10-22 ENCOUNTER — Ambulatory Visit (HOSPITAL_COMMUNITY): Payer: BLUE CROSS/BLUE SHIELD | Attending: Cardiovascular Disease

## 2018-10-22 DIAGNOSIS — Q2381 Bicuspid aortic valve: Secondary | ICD-10-CM

## 2018-10-22 DIAGNOSIS — I351 Nonrheumatic aortic (valve) insufficiency: Secondary | ICD-10-CM

## 2018-10-22 DIAGNOSIS — Q231 Congenital insufficiency of aortic valve: Secondary | ICD-10-CM | POA: Insufficient documentation

## 2018-10-22 DIAGNOSIS — I350 Nonrheumatic aortic (valve) stenosis: Secondary | ICD-10-CM

## 2018-10-22 NOTE — Telephone Encounter (Signed)
-----   Message from Josue Hector, MD sent at 10/22/2018  1:24 PM EST ----- EF normal mild AS and mild to moderate AR stable since 2018 f/u echo in 2 years unless new symptoms

## 2018-10-22 NOTE — Telephone Encounter (Signed)
Patient's wife aware of results. Per Dr. Johnsie Cancel, your ejection fraction is normal. You have mild aortic stenosis and mild to moderate aortic regurgitation, this is stable since 2018. We will have you repeat study in 2 years, unless you have any new symptoms. Will place order for echo. Patient's wife verbalized understanding.

## 2018-10-26 ENCOUNTER — Other Ambulatory Visit: Payer: Self-pay | Admitting: Family Medicine

## 2018-10-28 NOTE — Progress Notes (Signed)
Assessment and Plan:   Bicuspid aortic valve Continue follow up cardio  Borderline systolic HTN - continue medications, DASH diet, exercise and monitor at home. Call if greater than 130/80.  -     CBC with Differential/Platelet -     BASIC METABOLIC PANEL WITH GFR -     Hepatic function panel -     TSH  Chronic obstructive pulmonary disease, unspecified COPD type ([place]) No triggers, well controlled symptoms, cont to monitor Advised to stop smoking  Mixed hyperlipidemia -continue medications, check lipids, decrease fatty foods, increase activity.  -     Lipid panel  Medication management  Right lower quadrant abdominal pain Will do prednisone trial Strongly suggest stop smoking to see if this helps the pain Can try nortriptyline to see if it is nerve related, low dose up to 3 x a day Continue follow up Dr. Tamala Julian   Continue diet and meds as discussed. Further disposition pending results of labs. Future Appointments  Date Time Provider [place]  11/07/2018 10:30 AM Josue Hector, MD [facility]  05/02/2019  9:00 AM [name], PA-C [id number] None    HPI 62 y.o. male  presents for 3 month follow up with hypertension, hyperlipidemia, prediabetes and vitamin D.  His blood pressure has been controlled at home, today their BP is BP: 132/80.   He does not workout he is now retired.  He denies chest pain, shortness of breath, dizziness.  Has bicuspid valve, follows with Cardio, sees pain management for rib pain, has tried lyrica/gabpentin/cybalata without help, saw Dr. Hulan Saas and tried trail of symbicort for 3 weeks and states it did not help. Prednisone states it did help.    He has COPD, smokes, has quit for 5 years but started back.      He is on cholesterol medication and denies myalgias. His cholesterol is not at goal. The cholesterol last visit was:   Lab Results  Component Value Date   CHOL 207 (H) 04/30/2018   HDL 30 (L) 04/30/2018   LDLCALC 134 (H) 04/30/2018   TRIG 282 (H) 04/30/2018   CHOLHDL 6.9 (H) 04/30/2018    He has been working on diet and exercise for prediabetes, and denies foot ulcerations, hyperglycemia, hypoglycemia , increased appetite, nausea, paresthesia of the feet, polydipsia, polyuria, visual disturbances, vomiting and weight loss. Last A1C in the office was:  Lab Results  Component Value Date   HGBA1C 4.9 04/11/2017   Patient is on Vitamin D supplement.  Lab Results  Component Value Date   VD25OH 66 04/30/2018   BMI is Body mass index is 30.04 kg/m., he is working on diet and exercise. Wt Readings from Last 3 Encounters:  10/31/18 234 lb (106.1 kg)  09/26/18 229 lb (103.9 kg)  08/29/18 230 lb (104.3 kg)    Current Medications:  Current Outpatient Medications on File Prior to Visit  Medication Sig Dispense Refill  . acetaminophen (TYLENOL) 500 MG tablet Take 1,500 mg by mouth every 6 (six) hours as needed for mild pain.    Marland Kitchen aspirin 81 MG tablet Take 81 mg by mouth daily.     . Cholecalciferol (VITAMIN D) 2000 UNITS CAPS Take 1 capsule by mouth daily.     . fenofibrate micronized (LOFIBRA) 134 MG capsule TAKE ONE CAPSULE BY MOUTH EVERY DAY BEFORE BREAKFAST. 90 capsule 1  . Flaxseed, Linseed, (FLAXSEED OIL) 1200 MG CAPS Take 1 capsule by mouth daily.    . montelukast (SINGULAIR) 10 MG tablet TAKE  1 TABLET BY MOUTH EVERYDAY AT BEDTIME 90 tablet 0  . Omega-3 Fatty Acids (FISH OIL PO) Take 1 tablet by mouth daily.    . traZODone (DESYREL) 150 MG tablet TAKE 1/2-1 TABLET BY MOUTH AT BEDTIME AS NEEDED 90 tablet 0   No current facility-administered medications on file prior to visit.     Medical History:  Past Medical History:  Diagnosis Date  . Abnormal CT scan, chest    Blebs on CT chest but NORMAL PFTs- no COPD  . Allergy   . Aortic stenosis   . Bicuspid aortic valve    Echo 2014  EF 55-60% Mild to moderate AR mild AS with mean gradient 18 mmHg and peak of 25 mmHg. ? Bicuspid valve  with dilated aortic root 4.0 cm.  . diverticulosis   . DJD (degenerative joint disease)   . ED (erectile dysfunction)   . Elevated blood pressure reading without diagnosis of hypertension   . Heart murmur   . Hx of colonic polyp 2015  . Hypercholesteremia   . Skin cancer    Patient denies     Allergies:  Allergies  Allergen Reactions  . Morphine Itching    Can take hydrocodone without issues     Review of Systems:  Review of Systems  Constitutional: Negative for chills, fever and malaise/fatigue.  HENT: Negative for congestion, ear pain and sore throat.   Eyes: Negative.   Respiratory: Negative for cough, hemoptysis and shortness of breath.   Cardiovascular: Negative for chest pain, palpitations and leg swelling.  Gastrointestinal: Positive for abdominal pain. Negative for blood in stool, constipation, diarrhea, heartburn, melena, nausea and vomiting.  Genitourinary: Negative.   Skin: Negative.   Neurological: Negative for dizziness, sensory change, loss of consciousness and headaches.  Psychiatric/Behavioral: Negative for depression. The patient is not nervous/anxious and does not have insomnia.     Family history- Review and unchanged  Social history- Review and unchanged  Physical Exam: BP 132/80   Pulse 77   Temp (!) 97.5 F (36.4 C)   Ht 6\' 2"  (1.88 m)   Wt 234 lb (106.1 kg)   SpO2 99%   BMI 30.04 kg/m  Wt Readings from Last 3 Encounters:  10/31/18 234 lb (106.1 kg)  09/26/18 229 lb (103.9 kg)  08/29/18 230 lb (104.3 kg)    General Appearance: Well nourished well developed, in no apparent distress. Eyes: PERRLA, EOMs, conjunctiva no swelling or erythema ENT/Mouth: Ear canals normal without obstruction, swelling, erythma, discharge.  TMs normal bilaterally.  Oropharynx moist, clear, without exudate, or postoropharyngeal swelling. Neck: Supple, thyroid normal,no cervical adenopathy  Respiratory: Respiratory effort normal, Breath sounds clear A&P without  rhonchi, wheeze, or rale.  No retractions, no accessory usage. Cardio: RRR with no MRGs. Brisk peripheral pulses without edema.  Abdomen: Soft, + BS,  Non tender, no guarding, rebound, hernias, masses. Musculoskeletal: Full ROM, 5/5 strength, Normal gait Skin: Warm, dry without rashes, lesions, ecchymosis.  Neuro: Awake and oriented X 3, Cranial nerves intact. Normal muscle tone, no cerebellar symptoms. Psych: Normal affect, Insight and Judgment appropriate.    [name], PA-C 9:32 AM [facility]

## 2018-10-31 ENCOUNTER — Encounter: Payer: Self-pay | Admitting: Physician Assistant

## 2018-10-31 ENCOUNTER — Ambulatory Visit: Payer: BLUE CROSS/BLUE SHIELD | Admitting: Physician Assistant

## 2018-10-31 VITALS — BP 132/80 | HR 77 | Temp 97.5°F | Ht 74.0 in | Wt 234.0 lb

## 2018-10-31 DIAGNOSIS — J449 Chronic obstructive pulmonary disease, unspecified: Secondary | ICD-10-CM

## 2018-10-31 DIAGNOSIS — Z23 Encounter for immunization: Secondary | ICD-10-CM

## 2018-10-31 DIAGNOSIS — R030 Elevated blood-pressure reading, without diagnosis of hypertension: Secondary | ICD-10-CM

## 2018-10-31 DIAGNOSIS — Q231 Congenital insufficiency of aortic valve: Secondary | ICD-10-CM

## 2018-10-31 DIAGNOSIS — E782 Mixed hyperlipidemia: Secondary | ICD-10-CM | POA: Diagnosis not present

## 2018-10-31 DIAGNOSIS — R109 Unspecified abdominal pain: Secondary | ICD-10-CM | POA: Diagnosis not present

## 2018-10-31 LAB — LIPID PANEL
CHOLESTEROL: 228 mg/dL — AB (ref <200)
HDL: 34 mg/dL — AB (ref >40)
LDL CHOLESTEROL (CALC): 140 mg/dL — AB
Non-HDL Cholesterol (Calc): 194 mg/dL (calc) — ABNORMAL HIGH (ref <130)
TRIGLYCERIDES: 352 mg/dL — AB (ref <150)
Total CHOL/HDL Ratio: 6.7 (calc) — ABNORMAL HIGH (ref <5.0)

## 2018-10-31 LAB — CBC WITH DIFFERENTIAL/PLATELET
BASOS PCT: 0.5 %
Basophils Absolute: 31 cells/uL (ref 0–200)
EOS ABS: 122 {cells}/uL (ref 15–500)
Eosinophils Relative: 2 %
HCT: 42.2 % (ref 38.5–50.0)
Hemoglobin: 14.7 g/dL (ref 13.2–17.1)
Lymphs Abs: 1013 cells/uL (ref 850–3900)
MCH: 31.8 pg (ref 27.0–33.0)
MCHC: 34.8 g/dL (ref 32.0–36.0)
MCV: 91.3 fL (ref 80.0–100.0)
MPV: 11.2 fL (ref 7.5–12.5)
Monocytes Relative: 11.2 %
NEUTROS PCT: 69.7 %
Neutro Abs: 4252 cells/uL (ref 1500–7800)
PLATELETS: 202 10*3/uL (ref 140–400)
RBC: 4.62 10*6/uL (ref 4.20–5.80)
RDW: 13 % (ref 11.0–15.0)
TOTAL LYMPHOCYTE: 16.6 %
WBC: 6.1 10*3/uL (ref 3.8–10.8)
WBCMIX: 683 {cells}/uL (ref 200–950)

## 2018-10-31 LAB — COMPLETE METABOLIC PANEL WITH GFR
AG Ratio: 1.8 (calc) (ref 1.0–2.5)
ALBUMIN MSPROF: 4.4 g/dL (ref 3.6–5.1)
ALKALINE PHOSPHATASE (APISO): 61 U/L (ref 40–115)
ALT: 14 U/L (ref 9–46)
AST: 14 U/L (ref 10–35)
BUN: 13 mg/dL (ref 7–25)
CO2: 28 mmol/L (ref 20–32)
CREATININE: 1.24 mg/dL (ref 0.70–1.25)
Calcium: 10 mg/dL (ref 8.6–10.3)
Chloride: 105 mmol/L (ref 98–110)
GFR, Est African American: 71 mL/min/{1.73_m2} (ref >=60)
GFR, Est Non African American: 61 mL/min/{1.73_m2} (ref >=60)
GLUCOSE: 104 mg/dL — AB (ref 65–99)
Globulin: 2.5 g/dL (calc) (ref 1.9–3.7)
Potassium: 5.1 mmol/L (ref 3.5–5.3)
Sodium: 140 mmol/L (ref 135–146)
Total Bilirubin: 0.4 mg/dL (ref 0.2–1.2)
Total Protein: 6.9 g/dL (ref 6.1–8.1)

## 2018-10-31 LAB — TSH: TSH: 2.74 m[IU]/L (ref 0.40–4.50)

## 2018-10-31 MED ORDER — PREDNISONE 20 MG PO TABS: ORAL_TABLET | ORAL | 0 refills | Status: DC

## 2018-10-31 MED ORDER — NORTRIPTYLINE HCL 10 MG PO CAPS: 10.0000 mg | ORAL_CAPSULE | Freq: Three times a day (TID) | ORAL | 2 refills | Status: DC | PRN

## 2018-10-31 NOTE — Patient Instructions (Addendum)
Will do prednisone trial Strongly suggest stop smoking to see if this helps the pain, here is some info below Can try nortriptyline to see if it is nerve related, low dose up to 3 x a day   [place]  [phone] for more information or for a free program for smoking cessation help.   You can call QUIT SMART 1-800-QUIT-NOW for free nicotine patches or replacement therapy- if they are out- keep calling  [facility] Can call for smoking cessation classes, [phone]  If you have a smart phone, please look up Smoke Free app, this will help you stay on track and give you information about money you have saved, life that you have gained back and a ton of more information.   We are giving you chantix for smoking cessation. You can do it! And we are here to help! You may have heard some scary side effects about chantix, the three most common I hear about are nausea, crazy dreams and depression.  However, I like for my patients to try to stay on 1/2 a tablet twice a day rather than one tablet twice a day as normally prescribed. This helps decrease the chances of side effects and helps save money by making a one month prescription last two months  Please start the prescription this way:  Start 1/2 tablet by mouth once daily after food with a full glass of water for 3 days Then do 1/2 tablet by mouth twice daily for 4 days. During this first week you can smoke, but try to stop after this week.  At this point we have several options: 1) continue on 1/2 tablet twice a day- which I encourage you to do. You can stay on this dose the rest of the time on the medication or if you still feel the need to smoke you can do one of the two options below. 2) do one tablet in the morning and 1/2 in the evening which helps decrease dreams. 3) do one tablet twice a day.   What if I miss a dose? If you miss a dose, take it as soon as you can. If it is  almost time for your next dose, take only that dose. Do not take double or extra doses.  What should I watch for while using this medicine? Visit your doctor or health care professional for regular check ups. Ask for ongoing advice and encouragement from your doctor or healthcare professional, friends, and family to help you quit. If you smoke while on this medication, quit again  Your mouth may get dry. Chewing sugarless gum or hard candy, and drinking plenty of water may help. Contact your doctor if the problem does not go away or is severe.  You may get drowsy or dizzy. Do not drive, use machinery, or do anything that needs mental alertness until you know how this medicine affects you. Do not stand or sit up quickly, especially if you are an older patient.   The use of this medicine may increase the chance of suicidal thoughts or actions. Pay special attention to how you are responding while on this medicine. Any worsening of mood, or thoughts of suicide or dying should be reported to your health care professional right away.  ADVANTAGES OF QUITTING SMOKING  Within 20 minutes, blood pressure decreases. Your pulse is at normal level.  After 8 hours, carbon monoxide levels in the blood return to normal. Your oxygen  level increases.  After 24 hours, the chance of having a heart attack starts to decrease. Your breath, hair, and body stop smelling like smoke.  After 48 hours, damaged nerve endings begin to recover. Your sense of taste and smell improve.  After 72 hours, the body is virtually free of nicotine. Your bronchial tubes relax and breathing becomes easier.  After 2 to 12 weeks, lungs can hold more air. Exercise becomes easier and circulation improves.  After 1 year, the risk of coronary heart disease is cut in half.  After 5 years, the risk of stroke falls to the same as a nonsmoker.  After 10 years, the risk of lung cancer is cut in half and the risk of other cancers decreases  significantly.  After 15 years, the risk of coronary heart disease drops, usually to the level of a nonsmoker.  You will have extra money to spend on things other than cigarettes.     When it comes to diets, agreement about the perfect plan isn't easy to find, even among the experts. Experts at the [employer] developed an idea known as the Healthy Eating Plate. Just imagine a plate divided into logical, healthy portions.  The emphasis is on diet quality:  Load up on vegetables and fruits - one-half of your plate: Aim for color and variety, and remember that potatoes don't count.  Go for whole grains - one-quarter of your plate: Whole wheat, barley, wheat berries, quinoa, oats, brown rice, and foods made with them. If you want pasta, go with whole wheat pasta.  Protein power - one-quarter of your plate: Fish, chicken, beans, and nuts are all healthy, versatile protein sources. Limit red meat.  The diet, however, does go beyond the plate, offering a few other suggestions.  Use healthy plant oils, such as olive, canola, soy, corn, sunflower and peanut. Check the labels, and avoid partially hydrogenated oil, which have unhealthy trans fats.  If you're thirsty, drink water. Coffee and tea are good in moderation, but skip sugary drinks and limit milk and dairy products to one or two daily servings.  The type of carbohydrate in the diet is more important than the amount. Some sources of carbohydrates, such as vegetables, fruits, whole grains, and beans-are healthier than others.  Finally, stay active.

## 2018-10-31 NOTE — Addendum Note (Signed)
Addended by: Elsie Amis D on: 10/31/2018 10:05 AM   Modules accepted: Orders

## 2018-11-01 NOTE — Progress Notes (Signed)
Patient ID: Anthony Paul, male   DOB: 1956-01-20, 62 y.o.   MRN: [id number]   62 y.o. referred by Dr Melford Aase for murmur in 2014 Diagnosed with bicuspid AV .Sees dentist twice/yearr. Previous smoker. CRF elevated BP and cholesterol MRI  2013 with no coarctation and ascending aorta 3.9 cm   Echo 9/14 with mild AS and moderate AR gradients 16/27 mmHg Echo 8/15 normal EF gradients 12/25 mmHg mild AR  Echo 09/21/15 normal EF mild  AS mean gradient 13 mmHg peak 27 mmHg moderate AR Echo 10/10/16: normal EF mild AS mean gradient 16 mmHg peak 28 mmHg moderate AR Echo 10/20/17 normal EF mild /mod AS mean gradient 18 mmHg peak 36 mmHg moderate AR  Echo 10/22/18  Normal EF functionally bicuspid mild AS mild to moderate AR  Mean gradient 18 mmHg peak 35 mmHg   Retired Geophysicist/field seismologist for [employer]  Discussed need to re look at aorta which was mildly dilated 2013 at 3.9 cm by MRI  3 Daughters two in town and one in [place] with no dyspnea palpitations or syncope   ROS: Denies fever, malais, weight loss, blurry vision, decreased visual acuity, cough, sputum, SOB, hemoptysis, pleuritic pain, palpitaitons, heartburn, abdominal pain, melena, lower extremity edema, claudication, or rash.  All other systems reviewed and negative  General: BP 136/76   Pulse 89   Ht 6\' 2"  (1.88 m)   Wt 231 lb 4 oz (104.9 kg)   SpO2 98%   BMI 29.69 kg/m  Affect appropriate Healthy:  appears stated age 7: normal Neck supple with no adenopathy JVP normal no bruits no thyromegaly Lungs clear with no wheezing and good diaphragmatic motion Heart:  S1/S2  AS/AR  murmur, no rub, gallop or click PMI normal Abdomen: benighn, BS positve, no tenderness, no AAA no bruit.  No HSM or HJR Distal pulses intact with no bruits No edema Neuro non-focal Skin warm and dry No muscular weakness     Current Outpatient Medications  Medication Sig Dispense Refill  . acetaminophen (TYLENOL) 500 MG tablet Take 1,500 mg by mouth every 6  (six) hours as needed for mild pain.    Marland Kitchen aspirin 81 MG tablet Take 81 mg by mouth daily.     . Cholecalciferol (VITAMIN D) 2000 UNITS CAPS Take 1 capsule by mouth daily.     . fenofibrate micronized (LOFIBRA) 134 MG capsule TAKE ONE CAPSULE BY MOUTH EVERY DAY BEFORE BREAKFAST. 90 capsule 1  . Flaxseed, Linseed, (FLAXSEED OIL) 1200 MG CAPS Take 1 capsule by mouth daily.    Marland Kitchen HYDROcodone-acetaminophen (NORCO) 10-325 MG tablet Take 1 tablet by mouth every 6 (six) hours as needed.    . montelukast (SINGULAIR) 10 MG tablet TAKE 1 TABLET BY MOUTH EVERYDAY AT BEDTIME 90 tablet 0  . nortriptyline (PAMELOR) 10 MG capsule Take 1 capsule (10 mg total) by mouth 3 (three) times daily as needed (AB pain start 1 at night). 30 capsule 2  . Omega-3 Fatty Acids (FISH OIL PO) Take 1 tablet by mouth daily.    . predniSONE (DELTASONE) 20 MG tablet 3 tablets daily with food for 3 days, 2 tabs daily for 3 days, 1 tab a day for 5 days. 20 tablet 0  . traZODone (DESYREL) 150 MG tablet TAKE 1/2-1 TABLET BY MOUTH AT BEDTIME AS NEEDED 90 tablet 0  . metoprolol tartrate (LOPRESSOR) 50 MG tablet Take one tablet by mouth the night before your CT and take one tablet the day of your CT. 2 tablet  0   No current facility-administered medications for this visit.     Allergies  Morphine  Electrocardiogram: 5/15  SR rate 78 normal 10/20/17 SR rate 69 normal  11/07/18 SR rate 82 PVC   Assessment and Plan  Bicuspid Aortic Valve:  10/22/18 stable mild AS mean gradient 18 mmHg mild to moderate AR Had mild aortic root dilatation 3.9 back in 2013 will update with gated cardiac CT   GI:  Seeing pain clinic CT with no pathology low risk for SMA disease  F/u GI Elevated Triglycerides:  Continue fenobibrate  Labs with primary  CAD:  Normal ETT 2013 and 10/10/16  Will try to evaluated cors during cardiac CT needed To size aorta in setting of bicuspid valve   Jenkins Rouge

## 2018-11-07 ENCOUNTER — Encounter: Payer: Self-pay | Admitting: Cardiovascular Disease

## 2018-11-07 ENCOUNTER — Ambulatory Visit: Payer: BLUE CROSS/BLUE SHIELD | Admitting: Cardiovascular Disease

## 2018-11-07 VITALS — BP 136/76 | HR 89 | Ht 74.0 in | Wt 231.2 lb

## 2018-11-07 DIAGNOSIS — Q231 Congenital insufficiency of aortic valve: Secondary | ICD-10-CM | POA: Diagnosis not present

## 2018-11-07 DIAGNOSIS — I712 Thoracic aortic aneurysm, without rupture, unspecified: Secondary | ICD-10-CM

## 2018-11-07 MED ORDER — METOPROLOL TARTRATE 50 MG PO TABS: ORAL_TABLET | ORAL | 0 refills | Status: DC

## 2018-11-07 NOTE — Patient Instructions (Addendum)
Medication Instructions:   If you need a refill on your cardiac medications before your next appointment, please call your pharmacy.   Lab work:  If you have labs (blood work) drawn today and your tests are completely normal, you will receive your results only by: Marland Kitchen MyChart Message (if you have MyChart) OR . A paper copy in the mail If you have any lab test that is abnormal or we need to change your treatment, we will call you to review the results.  Testing/Procedures: Your physician has requested that you have cardiac CT. Cardiac computed tomography (CT) is a painless test that uses an x-ray machine to take clear, detailed pictures of your heart. For further information please visit [email or web address]. Please follow instruction sheet as given.  Follow-Up: At [facility], you and your health needs are our priority.  As part of our continuing mission to provide you with exceptional heart care, we have created designated Provider Care Teams.  These Care Teams include your primary Cardiologist (physician) and Advanced Practice Providers (APPs -  Physician Assistants and Nurse Practitioners) who all work together to provide you with the care you need, when you need it. You will need a follow up appointment in 1 years.  Please call our office 2 months in advance to schedule this appointment.  You may see No primary care provider on file. or one of the following Advanced Practice Providers on your designated Care Team:   Truitt Merle, NP Cecilie Kicks, NP Kathyrn Drown, NP   Please arrive at the [facility] main entrance of [facility] at xx:xx AM (30-45 minutes prior to test start time)  [facility] [place], [place] [address] [phone]  Proceed to the [facility] Radiology Department (First Floor).  Please follow these instructions carefully (unless otherwise directed):  On the Night Before the Test: . Be sure to Drink plenty of water. . Do  not consume any caffeinated/decaffeinated beverages or chocolate 12 hours prior to your test. . Do not take any antihistamines 12 hours prior to your test. . Take metoprolol 50 mg (Lopressor) the night before your test.  On the Day of the Test: . Drink plenty of water. Do not drink any water within one hour of the test. . Do not eat any food 4 hours prior to the test. . You may take your regular medications prior to the test.  . Take metoprolol 50 mg (Lopressor) two hours prior to test.      After the Test: . Drink plenty of water. . After receiving IV contrast, you may experience a mild flushed feeling. This is normal. . On occasion, you may experience a mild rash up to 24 hours after the test. This is not dangerous. If this occurs, you can take Benadryl 25 mg and increase your fluid intake. . If you experience trouble breathing, this can be serious. If it is severe call 911 IMMEDIATELY. If it is mild, please call our office.

## 2018-11-25 ENCOUNTER — Other Ambulatory Visit: Payer: Self-pay | Admitting: Internal Medicine

## 2018-12-10 ENCOUNTER — Telehealth (HOSPITAL_COMMUNITY): Payer: Self-pay | Admitting: Emergency Medicine

## 2018-12-10 NOTE — Telephone Encounter (Signed)
Reaching out to patient to offer assistance regarding upcoming cardiac imaging study; unable to reach patient, left a message on voicemail with name and call back number provided for further questions should they arise Marchia Bond RN Navigator Cardiac Imaging [phone]

## 2018-12-10 NOTE — Telephone Encounter (Signed)
Pt returning phone call; pt verbalizes understanding of appt date/time, parking situation and where to check in, pre-test NPO status and medications ordered, and verified current allergies; name and call back number provided for further questions should they arise Marchia Bond RN Navigator Cardiac Imaging [phone]

## 2018-12-11 ENCOUNTER — Ambulatory Visit (HOSPITAL_COMMUNITY): Admission: RE | Admit: 2018-12-11 | Payer: BLUE CROSS/BLUE SHIELD | Source: Ambulatory Visit

## 2018-12-11 ENCOUNTER — Ambulatory Visit (HOSPITAL_COMMUNITY)
Admission: RE | Admit: 2018-12-11 | Discharge: 2018-12-11 | Disposition: A | Discharge status: Home / Self Care | Payer: BLUE CROSS/BLUE SHIELD | Source: Ambulatory Visit | Attending: Cardiovascular Disease | Admitting: Cardiovascular Disease

## 2018-12-11 DIAGNOSIS — I712 Thoracic aortic aneurysm, without rupture, unspecified: Secondary | ICD-10-CM

## 2018-12-11 DIAGNOSIS — Q231 Congenital insufficiency of aortic valve: Secondary | ICD-10-CM | POA: Insufficient documentation

## 2018-12-11 MED ORDER — IOPAMIDOL (ISOVUE-370) INJECTION 76%
100.0000 mL | Freq: Once | INTRAVENOUS | Status: AC | PRN
  Administered 2018-12-11: 100 mL via INTRAVENOUS

## 2018-12-11 MED ORDER — NITROGLYCERIN 0.4 MG SL SUBL
0.8000 mg | SUBLINGUAL_TABLET | Freq: Once | SUBLINGUAL | Status: AC
  Administered 2018-12-11: 0.8 mg via SUBLINGUAL
  Filled 2018-12-11: qty 25

## 2018-12-11 MED ORDER — NITROGLYCERIN 0.4 MG SL SUBL
SUBLINGUAL_TABLET | SUBLINGUAL | Status: AC
  Filled 2018-12-11: qty 2

## 2019-02-13 ENCOUNTER — Encounter: Payer: Self-pay | Admitting: Physician Assistant

## 2019-02-13 ENCOUNTER — Ambulatory Visit: Payer: BLUE CROSS/BLUE SHIELD | Admitting: Physician Assistant

## 2019-02-13 ENCOUNTER — Other Ambulatory Visit (INDEPENDENT_AMBULATORY_CARE_PROVIDER_SITE_OTHER): Payer: BLUE CROSS/BLUE SHIELD

## 2019-02-13 VITALS — BP 138/90 | HR 92 | Ht 73.0 in | Wt 234.0 lb

## 2019-02-13 DIAGNOSIS — Z9049 Acquired absence of other specified parts of digestive tract: Secondary | ICD-10-CM

## 2019-02-13 DIAGNOSIS — G8929 Other chronic pain: Secondary | ICD-10-CM

## 2019-02-13 DIAGNOSIS — R195 Other fecal abnormalities: Secondary | ICD-10-CM

## 2019-02-13 DIAGNOSIS — R1011 Right upper quadrant pain: Secondary | ICD-10-CM

## 2019-02-13 DIAGNOSIS — Z8719 Personal history of other diseases of the digestive system: Secondary | ICD-10-CM

## 2019-02-13 DIAGNOSIS — R109 Unspecified abdominal pain: Secondary | ICD-10-CM

## 2019-02-13 DIAGNOSIS — R194 Change in bowel habit: Secondary | ICD-10-CM

## 2019-02-13 DIAGNOSIS — R143 Flatulence: Secondary | ICD-10-CM | POA: Diagnosis not present

## 2019-02-13 LAB — CBC WITH DIFFERENTIAL/PLATELET
Basophils Absolute: 0 10*3/uL (ref 0.0–0.1)
Basophils Relative: 0.4 % (ref 0.0–3.0)
Eosinophils Absolute: 0.1 10*3/uL (ref 0.0–0.7)
Eosinophils Relative: 1.7 % (ref 0.0–5.0)
HCT: 45.9 % (ref 39.0–52.0)
Hemoglobin: 15.9 g/dL (ref 13.0–17.0)
Lymphocytes Relative: 18.6 % (ref 12.0–46.0)
Lymphs Abs: 1.3 10*3/uL (ref 0.7–4.0)
MCHC: 34.6 g/dL (ref 30.0–36.0)
MCV: 92.4 fl (ref 78.0–100.0)
Monocytes Absolute: 0.8 10*3/uL (ref 0.1–1.0)
Monocytes Relative: 11 % (ref 3.0–12.0)
Neutro Abs: 5 10*3/uL (ref 1.4–7.7)
Neutrophils Relative %: 68.3 % (ref 43.0–77.0)
Platelets: 212 10*3/uL (ref 150.0–400.0)
RBC: 4.97 Mil/uL (ref 4.22–5.81)
RDW: 13.9 % (ref 11.5–15.5)
WBC: 7.2 10*3/uL (ref 4.0–10.5)

## 2019-02-13 LAB — COMPREHENSIVE METABOLIC PANEL
ALT: 17 U/L (ref 0–53)
AST: 15 U/L (ref 0–37)
Albumin: 4.3 g/dL (ref 3.5–5.2)
Alkaline Phosphatase: 58 U/L (ref 39–117)
BILIRUBIN TOTAL: 0.5 mg/dL (ref 0.2–1.2)
BUN: 17 mg/dL (ref 6–23)
CO2: 26 mEq/L (ref 19–32)
CREATININE: 1.4 mg/dL (ref 0.40–1.50)
Calcium: 9.8 mg/dL (ref 8.4–10.5)
Chloride: 105 mEq/L (ref 96–112)
GFR: 51.01 mL/min — ABNORMAL LOW (ref >60.00)
Glucose, Bld: 96 mg/dL (ref 70–99)
Potassium: 4.5 mEq/L (ref 3.5–5.1)
Sodium: 139 mEq/L (ref 135–145)
Total Protein: 6.9 g/dL (ref 6.0–8.3)

## 2019-02-13 LAB — SEDIMENTATION RATE: Sed Rate: 3 mm/hr (ref 0–20)

## 2019-02-13 NOTE — Patient Instructions (Addendum)
If you are age 64 or older, your body mass index should be between 23-30. Your Body mass index is 30.87 kg/m. If this is out of the aforementioned range listed, please consider follow up with your Primary Care Provider.  If you are age 45 or younger, your body mass index should be between 19-25. Your Body mass index is 30.87 kg/m. If this is out of the aformentioned range listed, please consider follow up with your Primary Care Provider.   Your provider has requested that you go to the basement level for lab work before leaving today. Press "B" on the elevator. The lab is located at the first door on the left as you exit the elevator.  You have been scheduled for a CT scan of the abdomen and pelvis at [facility].  Please contact us imaging to set up scan.  I have faxed order to facility here in [place].   Use Gas X or Phayzme before meals for gas.  We will call you with results.  Thank you for choosing me and [facility].   Amy Esterwood, PA-c

## 2019-02-13 NOTE — Progress Notes (Signed)
Subjective:    Patient ID: Anthony Paul, male    DOB: 1956-08-08, 63 y.o.   MRN: [id number]  HPI Anthony Paul is a pleasant 63 year old white male, known to Dr. Havery Moros who was last seen in our office in June 2019.  He has history of chronic abdominal pain of unclear etiology which is been present over the past 6 years.  There may be an abdominal wall component.  He is followed by pain management and on chronic narcotics.  He has had previous extensive GI evaluation. When he was last seen he was given a trial of Cymbalta Patient says that he tried the Cymbalta a couple of months and noticed no change in his symptoms and stopped it. Other medical issues include hypertension, COPD, degenerative joint disease.  Patient is status post right inguinal hernia repair and had a partial colectomy for complicated diverticulitis. He also has history of adenomatous and sessile serrated colon polyps.   Colonoscopy was done in August 2019 with removal of a total of 8 polyps, the largest 4 to 5 mm.  Also noted to have multiple diverticuli in the transverse and left colon prep was not good and it was recommended he have follow-up in 1 year. Patient comes in today with complaints of intermittent sharp right upper quadrant pain over the past couple of weeks.  He says this feels somewhat different and definitely more sharp and intense than his usual abdominal pain.  Appendectomy has been having a lot of gas and looser stools that he has not noted any melena or hematochezia.  No fever or chills.  He has had some intermittent nausea without vomiting.  He is concerned because the pain is persisting and quite sharp at times.  Review of Systems Pertinent positive and negative review of systems were noted in the above HPI section.  All other review of systems was otherwise negative.  Outpatient Encounter Medications as of 02/13/2019  Medication Sig  . acetaminophen (TYLENOL) 500 MG tablet Take 1,500 mg by mouth every 6 (six)  hours as needed for mild pain.  Marland Kitchen aspirin 81 MG tablet Take 81 mg by mouth daily.   . Cholecalciferol (VITAMIN D) 2000 UNITS CAPS Take 1 capsule by mouth daily.   . fenofibrate micronized (LOFIBRA) 134 MG capsule TAKE ONE CAPSULE BY MOUTH EVERY DAY BEFORE BREAKFAST.  Marland Kitchen Flaxseed, Linseed, (FLAXSEED OIL) 1200 MG CAPS Take 1 capsule by mouth daily.  Marland Kitchen HYDROcodone-acetaminophen (NORCO) 10-325 MG tablet Take 1 tablet by mouth every 6 (six) hours as needed.  . Omega-3 Fatty Acids (FISH OIL PO) Take 1 tablet by mouth daily.  . traZODone (DESYREL) 150 MG tablet TAKE 1/2 TO 1 TABLET BY MOUTH AT BEDTIME AS NEEDED  . [DISCONTINUED] metoprolol tartrate (LOPRESSOR) 50 MG tablet Take one tablet by mouth the night before your CT and take one tablet the day of your CT.  . [DISCONTINUED] montelukast (SINGULAIR) 10 MG tablet TAKE 1 TABLET BY MOUTH EVERYDAY AT BEDTIME  . [DISCONTINUED] nortriptyline (PAMELOR) 10 MG capsule Take 1 capsule (10 mg total) by mouth 3 (three) times daily as needed (AB pain start 1 at night).   No facility-administered encounter medications on file as of 02/13/2019.    Allergies  Allergen Reactions  . Morphine Itching    Can take hydrocodone without issues   Patient Active Problem List   Diagnosis Date Noted  . Trigger point of abdomen 08/29/2018  . Abdominal pain 08/01/2018  . Chronic left shoulder pain 06/08/2018  .  Chest pain, atypical most c/w ibs  04/13/2018  . Cigarette smoker 04/13/2018  . Prediabetes 03/08/2015  . Vitamin D deficiency 03/08/2015  . Medication management 03/08/2015  . ED (erectile dysfunction)   . DJD (degenerative joint disease)   . Bicuspid aortic valve 06/27/2012  . Mixed hyperlipidemia 06/27/2012  . Borderline systolic HTN 61/60/7371  . COPD GOLD 0     Social History   Socioeconomic History  . Marital status: Married    Spouse name: Not on file  . Number of children: 3  . Years of education: Not on file  . Highest education level: Not on  file  Occupational History  . Occupation: retired Building control surveyor: [employer]  Social Needs  . Financial resource strain: Not on file  . Food insecurity:    Worry: Not on file    Inability: Not on file  . Transportation needs:    Medical: Not on file    Non-medical: Not on file  Tobacco Use  . Smoking status: Current Every Day Smoker    Packs/day: 0.50    Types: Cigarettes  . Smokeless tobacco: Former Systems developer    Types: Chew    Quit date: 12/19/2006  . Tobacco comment: Started smoking at age 34,maybe 1/2 ppd  Substance and Sexual Activity  . Alcohol use: Yes    Alcohol/week: 5.0 standard drinks    Types: 5 Standard drinks or equivalent per week    Comment: 1-2 drinks per day  . Drug use: No  . Sexual activity: Yes    Partners: Female  Lifestyle  . Physical activity:    Days per week: Not on file    Minutes per session: Not on file  . Stress: Not on file  Relationships  . Social connections:    Talks on phone: Not on file    Gets together: Not on file    Attends religious service: Not on file    Active member of club or organization: Not on file    Attends meetings of clubs or organizations: Not on file    Relationship status: Not on file  . Intimate partner violence:    Fear of current or ex partner: Not on file    Emotionally abused: Not on file    Physically abused: Not on file    Forced sexual activity: Not on file  Other Topics Concern  . Not on file  Social History Narrative  . Not on file    Anthony Paul family history includes Benign prostatic hyperplasia in his father; Cancer in his sister; Diabetes in his mother; Heart disease in his father; Kidney disease in his sister.      Objective:    Vitals:   02/13/19 1028  BP: 138/90  Pulse: 92    Physical Exam; Well-developed older white male in no acute distress, pleasant, height 6 foot 1, weight 234, BMI 30.8.  HEENT; nontraumatic normocephalic EOMI PERRLA sclera anicteric, oral mucosa moist.   Cardiovascular ;regular rate and rhythm with S1-S2 no murmur rub or gallop, Pulmonary; clear bilaterally, Abdomen; soft, bowel sounds are present there is no palpable mass or hepatosplenomegaly, there is mild tenderness in the right upper quadrant and epigastrium, no guarding or rebound, no appreciable costal margin tenderness..  Rectal; exam not done, Extremities; no clubbing cyanosis or edema skin warm and dry, Neuropsych; alert and oriented, grossly nonfocal mood and affect appropriate      Assessment & Plan:   #32 63 year old white male with history  of chronic abdominal pain x6 years, unclear etiology He is followed by pain management and on chronic narcotics.  May have a component of abdominal wall pain.  Patient comes in today with new acute right upper quadrant pain which he describes as sharp and intermittent over the past couple of weeks.  This is been associated with a change in bowel habits with looser stools, some nausea without vomiting and increase in gassiness. Etiology is not clear, rule out possible gallbladder disease, transverse diverticulitis, or other acute intra-abdominal inflammatory process.  #2 status post partial colectomy for complicated diverticulitis #3.  Status post right inguinal hernia repair #4.  History of adenomatous and sessile serrated colon polyps-last colonoscopy August 2019 with multiple polyps removed, and poor prep.  Follow-up recommended at 1 year  #5 COPD #6.  Hypertension #7.  Mild aortic stenosis  Plan; Will check CBC with differential, c-Met, lipase, Schedule for CT scan of the abdomen and pelvis with contrast (patient requesting [facility] facility for insurance purposes) Further recommendations pending results of above. Patient will need to be scheduled for colonoscopy with Dr. Havery Moros around August 2020.  Rapheal Masso S Jihan Mellette PA-C 02/13/2019   Cc: Unk Pinto, MD

## 2019-02-14 NOTE — Progress Notes (Signed)
Agree with assessment and plan as outlined.  

## 2019-02-18 ENCOUNTER — Telehealth: Payer: Self-pay

## 2019-02-18 NOTE — Telephone Encounter (Signed)
Received confirmation of CT ab/pelvis from [facility] for 3-3 at 12:15pm.  Contact phone # for [facility]: [phone]  Fax: [phone]

## 2019-02-19 ENCOUNTER — Encounter: Payer: Self-pay | Admitting: Gastroenterology

## 2019-02-22 ENCOUNTER — Telehealth: Payer: Self-pay | Admitting: Gastroenterology

## 2019-02-22 NOTE — Telephone Encounter (Signed)
Pt inquired about results of CT.  

## 2019-02-22 NOTE — Telephone Encounter (Signed)
Anthony Paul has put the report on Dr Doyne Keel desk.  FYI Dr Havery Moros

## 2019-02-22 NOTE — Telephone Encounter (Signed)
I am just seeing the CT results now. Done on 02/19/19: No acute process. Benign small hepatic cysts, some diverticulosis noted without diverticulitis. There is no cause for his abdominal pain that is noted. He's had pain for a very long time with negative imaging in the past. His recent labs are normal. He's failed trial of gabapentin/ lyrica / cymbalta.  My best guess is that he has musculoskeletal / neuropathic pain which can be challenging to manage. I am happy to see him back in the office to discuss options, I did not see him last time he was in. Please reassure him nothing concerning on labs or imaging. Thanks

## 2019-02-22 NOTE — Telephone Encounter (Signed)
Jan I am helping Beth. Have you seen the CT results?

## 2019-02-22 NOTE — Telephone Encounter (Signed)
The pt was advised of Dr Doyne Keel recommendations and he will follow up with PCP.

## 2019-02-25 NOTE — Telephone Encounter (Signed)
CT of Abd and Pelvis from [facility] on 02-19-2019 scanned to chart on 02-25-19

## 2019-04-29 NOTE — Progress Notes (Signed)
Complete Physical  Assessment and Plan:  Bicuspid aortic valve Has yearly following, no symptoms at this time  Borderline systolic HTN - continue medications, DASH diet, exercise and monitor at home. Call if greater than 130/80.  -     CBC with Differential/Platelet -     COMPLETE METABOLIC PANEL WITH GFR -     TSH -     Urinalysis, Routine w reflex microscopic -     Microalbumin / creatinine urine ratio  COPD GOLD 0  Recent normal CT chest Continue follow up pulmonary WILL GIVE INHALERS-? IF RIB PAIN IS PLEURISY  Mixed hyperlipidemia -SWITCH FROM FENOFIBRATE TO CRESTOR - check lipids, decrease fatty foods, increase activity.  -     Lipid panel  Abnormal glucose Discussed disease progression and risks Discussed diet/exercise, weight management and risk modification  Medication management -     Magnesium  Vitamin D deficiency -     VITAMIN D 25 Hydroxy (Vit-D Deficiency, Fractures)  Cigarette smoker Smoking cessation-  instruction/counseling given, counseled patient on the dangers of tobacco use, advised patient to stop smoking, and reviewed strategies to maximize success, patient not ready to quit at this time.  DISCUSSED LOW DOSE CT, GREATER THAN 40 PACK YEAR SMOKING HISTORY, DECLINES AT THIS TIME, DISCUSS NEXT YEAR  Erectile dysfunction, unspecified erectile dysfunction type -     PSA  Osteoarthritis, unspecified osteoarthritis type, unspecified site Continue pain management Has failed cymbalta, lyrica, gabaptentin Failed trigger point Massage and pressure belt helps  Encounter for general adult medical examination with abnormal findings 1 year  BMI 30.0-30.9,adult  Overweight  - long discussion about weight loss, diet, and exercise -recommended diet heavy in fruits and veggies and low in animal meats, cheeses, and dairy products  Screening PSA (prostate specific antigen) -     PSA  Erectile dysfunction, unspecified erectile dysfunction type -      Testosterone  Encounter for hepatitis C screening test for low risk patient -     Hepatitis C antibody  Screening for HIV (human immunodeficiency virus) -     HIV Antibody (routine testing w rflx)  B12 deficiency -     Vitamin B12 Was 207 in 2017-? Contributing to pain- get on sublingual-? Needs hots  Screening, anemia, deficiency, iron -     Iron,Total/Total Iron Binding Cap   Discussed med's effects and SE's. Screening labs and tests as requested with regular follow-up as recommended. Over 40 minutes of exam, counseling, chart review and critical decision making was performed Future Appointments  Date Time Provider [place]  05/07/2020  9:00 AM [name], PA-C [id number] None     HPI Patient presents for a complete physical.   He sees pain management for bilateral rib pain, states pain is getting worse, has tried lyrica/gabpentin without help,no help with Cymbalta. Pain is better in the AM and worse in the evening. Has had trigger points without help. No worsening pain with exercise or excretion, just helped daughter build deck and it did not make the pain worse. Had recent normal AB [place], and GI visit. Pain is getting worse, switched medications recently.  He continues to smoke, started age 27, smoked a pack a day from 36 yr to 19 years old 40+ pack year history, but states he has decreased 10 cigs, is not on an inhaler. Has not symptoms at this time.   His blood pressure has been controlled at home, today their BP is BP: 136/82   He has a bicuspid aortic  valve, follows with cardiology.  He does workout. He denies chest pain, shortness of breath, dizziness.   BMI is Body mass index is 30.43 kg/m., he is working on diet and exercise. Wt Readings from Last 3 Encounters:  05/02/19 237 lb (107.5 kg)  02/13/19 234 lb (106.1 kg)  11/07/18 231 lb 4 oz (104.9 kg)   He is on cholesterol medication, fenofibrate and denies myalgias. His cholesterol is not at goal. The  cholesterol last visit was:   Lab Results  Component Value Date   CHOL 228 (H) 10/31/2018   HDL 34 (L) 10/31/2018   LDLCALC 140 (H) 10/31/2018   TRIG 352 (H) 10/31/2018   CHOLHDL 6.7 (H) 10/31/2018    Last A1C in the office was:  Lab Results  Component Value Date   HGBA1C 4.9 04/11/2017   Last GFR: Lab Results  Component Value Date   GFRNONAA 61 10/31/2018    Patient is on Vitamin D supplement.   Lab Results  Component Value Date   VD25OH 66 04/30/2018     Last PSA was: Lab Results  Component Value Date   PSA 0.5 04/30/2018     Current Medications:  Current Outpatient Medications on File Prior to Visit  Medication Sig Dispense Refill  . acetaminophen (TYLENOL) 500 MG tablet Take 1,500 mg by mouth every 6 (six) hours as needed for mild pain.    Marland Kitchen aspirin 81 MG tablet Take 81 mg by mouth daily.     . Cholecalciferol (VITAMIN D) 2000 UNITS CAPS Take 1 capsule by mouth daily.     . fenofibrate micronized (LOFIBRA) 134 MG capsule TAKE ONE CAPSULE BY MOUTH EVERY DAY BEFORE BREAKFAST. 90 capsule 1  . Flaxseed, Linseed, (FLAXSEED OIL) 1200 MG CAPS Take 1 capsule by mouth daily.    . Omega-3 Fatty Acids (FISH OIL PO) Take 1 tablet by mouth daily.    . OXYCODONE HCL PO Take by mouth.    . traZODone (DESYREL) 150 MG tablet TAKE 1/2 TO 1 TABLET BY MOUTH AT BEDTIME AS NEEDED 90 tablet 1   No current facility-administered medications on file prior to visit.    Allergies:  Allergies  Allergen Reactions  . Morphine Itching    Can take hydrocodone without issues   Health Maintenance:  Immunization History  Administered Date(s) Administered  . Influenza Inj Mdck Quad With Preservative 10/24/2017, 10/31/2018  . Influenza Split 10/05/2015  . Influenza,Quad,Nasal, Live 10/05/2015  . Influenza,inj,Quad PF,6+ Mos 11/15/2013  . Influenza,inj,quad, With Preservative 09/26/2016  . Influenza-Unspecified 12/19/2009  . PPD Test 03/09/2015, 03/14/2016  . Pneumococcal Conjugate-13  11/15/2013  . Pneumococcal-Unspecified 12/20/1995  . Td 12/19/2004  . Tdap 03/09/2015   Tetanus: 2016 Pneumovax:1997 Prevnar 13: 2014 Flu vaccine:2019 Zostavax: DUE needs new one  DEXA: N/A Colonoscopy:  07/2018 [facility] wants repeat 1 year, Dr. Havery Moros, on bASA EGD: N/A PFTs  04/2018 CT chest 03/2018 at [facility]- plaque on aortic arch, normal chest CXR 2015 Echo 10/2017 Stress test 09/2016  Eye Exam: None Dentist: Dr. Jacquenette Shone  Patient Care Team: Unk Pinto, MD as PCP - General (Internal Medicine)  Medical History:  has COPD GOLD 0 ; Bicuspid aortic valve; Mixed hyperlipidemia; Borderline systolic HTN; ED (erectile dysfunction); DJD (degenerative joint disease); Abnormal glucose; Vitamin D deficiency; Medication management; Chest pain, atypical most c/w ibs ; Cigarette smoker; Chronic left shoulder pain; Abdominal pain; and Trigger point of abdomen on their problem list. Surgical History:  He  has a past surgical history that includes Colon surgery (  Jan 2012); Tonsillectomy; Hemorroidectomy; Colonoscopy (01/13/2014); Upper gi endoscopy (2015); Inguinal hernia repair (Left, 2015); and Resection distal clavical (Right). Family History:  His family history includes Benign prostatic hyperplasia in his father; Cancer in his sister; Diabetes in his mother; Heart disease in his father; Kidney disease in his sister. Social History:   reports that he has been smoking cigarettes. He has been smoking about 0.50 packs per day. He quit smokeless tobacco use about 12 years ago.  His smokeless tobacco use included chew. He reports current alcohol use of about 5.0 standard drinks of alcohol per week. He reports that he does not use drugs.   Review of Systems:  Review of Systems  Constitutional: Negative.   HENT: Negative.   Eyes: Negative.   Respiratory: Negative.   Cardiovascular: Negative.   Gastrointestinal: Negative.   Genitourinary: Negative.   Musculoskeletal: Positive for  myalgias.  Skin: Negative.     Physical Exam: Estimated body mass index is 30.43 kg/m as calculated from the following:   Height as of this encounter: 6\' 2"  (1.88 m).   Weight as of this encounter: 237 lb (107.5 kg). BP 136/82   Pulse 85   Temp (!) 97.5 F (36.4 C)   Ht 6\' 2"  (1.88 m)   Wt 237 lb (107.5 kg)   SpO2 99%   BMI 30.43 kg/m  General Appearance: Well nourished, in no apparent distress.  Eyes: PERRLA, EOMs, conjunctiva no swelling or erythema, normal fundi and vessels.  Sinuses: No Frontal/maxillary tenderness  ENT/Mouth: Ext aud canals clear, normal light reflex with TMs without erythema, bulging. Good dentition. No erythema, swelling, or exudate on post pharynx. Tonsils not swollen or erythematous. Hearing normal.  Neck: Supple, thyroid normal. No bruits  Respiratory: Respiratory effort normal, BS equal bilaterally without rales, rhonchi, wheezing or stridor.  Cardio: RRR without murmurs, rubs or gallops. Brisk peripheral pulses without edema.  Chest: symmetric, with normal excursions and percussion.  Abdomen: Soft, general tenderness, no guarding, rebound, hernias, masses, or organomegaly.  Lymphatics: Non tender without lymphadenopathy.  Genitourinary: defer Musculoskeletal: Full ROM all peripheral extremities,5/5 strength, and normal gait.  Skin: Warm, dry without rashes, lesions, ecchymosis. Neuro: Cranial nerves intact, reflexes equal bilaterally. Normal muscle tone, no cerebellar symptoms. Sensation intact.  Psych: Awake and oriented X 3, normal affect, Insight and Judgment appropriate.   EKG: defer cardio  [name] 9:21 AM [facility]

## 2019-05-02 ENCOUNTER — Other Ambulatory Visit: Payer: Self-pay

## 2019-05-02 ENCOUNTER — Ambulatory Visit: Payer: BLUE CROSS/BLUE SHIELD | Admitting: Physician Assistant

## 2019-05-02 VITALS — BP 136/82 | HR 85 | Temp 97.5°F | Ht 74.0 in | Wt 237.0 lb

## 2019-05-02 DIAGNOSIS — Z0000 Encounter for general adult medical examination without abnormal findings: Secondary | ICD-10-CM | POA: Diagnosis not present

## 2019-05-02 DIAGNOSIS — M25512 Pain in left shoulder: Secondary | ICD-10-CM

## 2019-05-02 DIAGNOSIS — E559 Vitamin D deficiency, unspecified: Secondary | ICD-10-CM | POA: Diagnosis not present

## 2019-05-02 DIAGNOSIS — Z131 Encounter for screening for diabetes mellitus: Secondary | ICD-10-CM

## 2019-05-02 DIAGNOSIS — Z1389 Encounter for screening for other disorder: Secondary | ICD-10-CM | POA: Diagnosis not present

## 2019-05-02 DIAGNOSIS — Z114 Encounter for screening for human immunodeficiency virus [HIV]: Secondary | ICD-10-CM

## 2019-05-02 DIAGNOSIS — R030 Elevated blood-pressure reading, without diagnosis of hypertension: Secondary | ICD-10-CM

## 2019-05-02 DIAGNOSIS — R7309 Other abnormal glucose: Secondary | ICD-10-CM

## 2019-05-02 DIAGNOSIS — E782 Mixed hyperlipidemia: Secondary | ICD-10-CM

## 2019-05-02 DIAGNOSIS — Z13220 Encounter for screening for lipoid disorders: Secondary | ICD-10-CM

## 2019-05-02 DIAGNOSIS — M1990 Unspecified osteoarthritis, unspecified site: Secondary | ICD-10-CM

## 2019-05-02 DIAGNOSIS — Z79899 Other long term (current) drug therapy: Secondary | ICD-10-CM

## 2019-05-02 DIAGNOSIS — Z0001 Encounter for general adult medical examination with abnormal findings: Secondary | ICD-10-CM

## 2019-05-02 DIAGNOSIS — Z130 Encounter for screening for diseases of the blood and blood-forming organs and certain disorders involving the immune mechanism: Secondary | ICD-10-CM

## 2019-05-02 DIAGNOSIS — N401 Enlarged prostate with lower urinary tract symptoms: Secondary | ICD-10-CM

## 2019-05-02 DIAGNOSIS — N529 Male erectile dysfunction, unspecified: Secondary | ICD-10-CM | POA: Diagnosis not present

## 2019-05-02 DIAGNOSIS — G8929 Other chronic pain: Secondary | ICD-10-CM

## 2019-05-02 DIAGNOSIS — R350 Frequency of micturition: Secondary | ICD-10-CM | POA: Diagnosis not present

## 2019-05-02 DIAGNOSIS — Z125 Encounter for screening for malignant neoplasm of prostate: Secondary | ICD-10-CM | POA: Diagnosis not present

## 2019-05-02 DIAGNOSIS — J449 Chronic obstructive pulmonary disease, unspecified: Secondary | ICD-10-CM

## 2019-05-02 DIAGNOSIS — E538 Deficiency of other specified B group vitamins: Secondary | ICD-10-CM

## 2019-05-02 DIAGNOSIS — Q231 Congenital insufficiency of aortic valve: Secondary | ICD-10-CM

## 2019-05-02 DIAGNOSIS — F17210 Nicotine dependence, cigarettes, uncomplicated: Secondary | ICD-10-CM

## 2019-05-02 DIAGNOSIS — Z1159 Encounter for screening for other viral diseases: Secondary | ICD-10-CM

## 2019-05-02 MED ORDER — ROSUVASTATIN CALCIUM 5 MG PO TABS: 5.0000 mg | ORAL_TABLET | Freq: Every day | ORAL | 11 refills | Status: DC

## 2019-05-02 NOTE — Patient Instructions (Addendum)
INFORMATION ABOUT YOUR STEROID INHALER  Can do steroid inhaler, NEED TO DO DAILY, this is NOT a rescue inhaler so if you are acutely short of breath please use your albuterol or call 911.  Do 1 puff ONCE a day.  Do before you brush your teeth OR wash your mouth afterwards.  IF YOU DO NOT Clinton YOUR MOUTH OUT IT CAN CAUSE YEAST Can do 2 tsp vinegar with water and switch to help prevent yeast or help yeast in your mouth.   Go to the ER if any chest pain, shortness of breath, nausea, dizziness, severe HA, changes vision/speech  THIS IS VERY VERY VERY IMPORTANT B12 is low end of normal, add sublingual B12. The sublingual matters more than the dose, get any dose but make sure it melts in your mouth.   Will help with energy, memory/concentration, decrease nerve pain, and help with weight loss. B12 is water soluble vitamin so you can not over dose on it,.    Suggest doing trial of crestor 5mg  for cholesterol and can stop fenofibrate and see how your trigs/LDL is  Statin therapy  In addition to the known lipid-lowering effects, statins are now widely accepted to have anti-inflammatory and immunomodulatory effects. Adjunctive use of statins has proven beneficial in the context of a wide range of inflammatory diseases, including rheumatoid arthritis.  Research has shown that the salutary effect of statins on cholesterol may not be their only benefit. Statin therapy has shown promise for everything from fighting viral infections to protecting the eye from cataracts.  A 2005 study of more than 700 hospital patients being treated for pneumonia found that the death rate was more than twice as high among those who were not using statins. In 2006, a [place] study examined the rate of sepsis, a deadly blood infection, among patients who had been hospitalized for heart events. In the two years after their hospitalization, the statin users had a rate of sepsis 19% lower than that of the non-statin users. A 2009  review of 22 studies found that statins appeared to have a beneficial effect on the outcome of infection, but they couldn't come to a firm conclusion.  Your LDL could improve, ideally we want it under a 100.  Your LDL is the bad cholesterol that can lead to heart attack and stroke. To lower your number you can decrease your fatty foods, red meat, cheese, milk and increase fiber like whole grains and veggies. You can also add a fiber supplement like Citracel or Benefiber, these do not cause gas and bloating and are safe to use. Especially if you have a strong family history of heart disease or stroke or you have evidence of plaque on any imaging like a chest xray, we may discuss at your next office visit putting you on a medication to get your number below 100.  Lab Results  Component Value Date   CHOL 228 (H) 10/31/2018   HDL 34 (L) 10/31/2018   LDLCALC 140 (H) 10/31/2018   TRIG 352 (H) 10/31/2018   CHOLHDL 6.7 (H) 10/31/2018    Your trigs are not in range, 10/31/2018: Triglycerides 352. Triglycerides are simple sugars in blood that are converted into a storage form. I recommend you avoid fried/greasy foods, sweets/candy, white rice , white potatoes,  anything made from white flour, sweet tea, soda, fruit juices and avoid alcohol in excess. Sweet potatoes, brown/wild rice/Quinoa, Vegetarian, spinach, or wheat pasta, Multi-grain bread - like multi-grain flat bread or sandwich thins are okay.  This is elevated enough to cause acute pancreatitis which can put you in the hospital and kill you. VERY VERY important to be strict with diet.    Vitamin B12 Deficiency Vitamin B12 deficiency occurs when the body does not have enough vitamin B12. Vitamin B12 is an important vitamin. The body needs vitamin B12:  To make red blood cells.  To make DNA. This is the genetic material inside cells.  To help the nerves work properly so they can carry messages from the brain to the body. Vitamin B12 deficiency can  cause various health problems, such as a low red blood cell count (anemia) or nerve damage. What are the causes? This condition may be caused by:  Not eating enough foods that contain vitamin B12.  Not having enough stomach acid and digestive fluids to properly absorb vitamin B12 from the food that you eat.  Certain digestive system diseases that make it hard to absorb vitamin B12. These diseases include Crohn disease, chronic pancreatitis, and cystic fibrosis.  Pernicious anemia. This is a condition in which the body does not make enough of a protein (intrinsic factor), resulting in too few red blood cells.  Having a surgery in which part of the stomach or small intestine is removed.  Taking certain medicines that make it hard for the body to absorb vitamin B12. These medicines include: ? Heartburn medicine (antacids and proton pump inhibitors). ? An antibiotic medicine called neomycin. ? Some medicines that are used to treat diabetes, tuberculosis, gout, or high cholesterol. What increases the risk? The following factors may make you more likely to develop a B12 deficiency:  Being older than age 79.  Eating a vegetarian or vegan diet, especially while you are pregnant.  Eating a poor diet while you are pregnant.  Taking certain drugs.  Having alcoholism. What are the signs or symptoms? In some cases, there are no symptoms of this condition. If the condition leads to anemia or nerve damage, various symptoms can occur, such as:  Weakness.  Fatigue.  Loss of appetite.  Weight loss.  Numbness or tingling in your hands and feet.  Redness and burning of the tongue.  Confusion or memory problems.  Depression.  Sensory problems, such as color blindness, ringing in the ears, or loss of taste.  Diarrhea or constipation.  Trouble walking. If anemia is severe, symptoms can include:  Shortness of breath.  Dizziness.  Rapid heart rate (tachycardia).  How is this  diagnosed? This condition may be diagnosed with a blood test to measure the level of vitamin B12 in your blood. You may have other tests to help find the cause of your vitamin B12 deficiency. These tests may include:  A complete blood count (CBC). This is a group of tests that measure certain characteristics of blood cells.  A blood test to measure intrinsic factor.  An endoscopy. In this procedure, a thin tube with a camera on the end is used to look into your stomach or intestines. How is this treated? Treatment for this condition depends on the cause. Common treatment options include:  Changing your eating and drinking habits, such as: ? Eating more foods that contain vitamin B12. ? Drinking less alcohol or no alcohol.  Taking vitamin B12 supplements. Your health care provider will tell you which dosage is best for you.  Getting vitamin B12 injections. Follow these instructions at home:  Take supplements only as told by your health care provider. Follow the directions carefully.  Get any injections  that are prescribed by your health care provider.  Do not miss your appointments.  Eat lots of healthy foods that contain vitamin B12. Ask your health care provider if you should work with a dietitian. Foods that contain vitamin B12 include: ? Meat. ? Meat from birds (poultry). ? Fish. ? Eggs. ? Cereal and dairy products that are fortified. This means that vitamin B12 has been added to the food. Check the label on the package to see if the food is fortified.  Do not abuse alcohol.  Keep all follow-up visits as told by your health care provider. This is important. Contact a health care provider if:  Your symptoms come back. Get help right away if:  You develop shortness of breath.  You have chest pain.  You become dizzy or you lose consciousness. This information is not intended to replace advice given to you by your health care provider. Make sure you discuss any questions  you have with your health care provider. Document Released: 02/27/2012 Document Revised: 05/18/2016 Document Reviewed: 04/22/2015 Elsevier Interactive Patient Education  2019 [employer].  Ask insurance and pharmacy about shingrix - new vaccine   Can go to [email or web address] for more information  Shingrix Vaccination  Two vaccines are licensed and recommended to prevent shingles in the [place] Zoster vaccine live (ZVL, Zostavax) has been in use since 2006. Recombinant zoster vaccine (RZV, Shingrix), has been in use since 2017 and is recommended by ACIP as the preferred shingles vaccine.  What Everyone Should Know about Shingles Vaccine (Shingrix) One of the Recommended Vaccines by Disease Shingles vaccination is the only way to protect against shingles and postherpetic neuralgia (PHN), the most common complication from shingles. CDC recommends that healthy adults 50 years and older get two doses of the shingles vaccine called Shingrix (recombinant zoster vaccine), separated by 2 to 6 months, to prevent shingles and the complications from the disease. Your doctor or pharmacist can give you Shingrix as a shot in your upper arm. Shingrix provides strong protection against shingles and PHN. Two doses of Shingrix is more than 90% effective at preventing shingles and PHN. Protection stays above 85% for at least the first four years after you get vaccinated. Shingrix is the preferred vaccine, over Zostavax (zoster vaccine live), a shingles vaccine in use since 2006. Zostavax may still be used to prevent shingles in healthy adults 60 years and older. For example, you could use Zostavax if a person is allergic to Shingrix, prefers Zostavax, or requests immediate vaccination and Shingrix is unavailable. Who Should Get Shingrix? Healthy adults 50 years and older should get two doses of Shingrix, separated by 2 to 6 months. You should get Shingrix even if in  the past you . had shingles  . received Zostavax  . are not sure if you had chickenpox There is no maximum age for getting Shingrix. If you had shingles in the past, you can get Shingrix to help prevent future occurrences of the disease. There is no specific length of time that you need to wait after having shingles before you can receive Shingrix, but generally you should make sure the shingles rash has gone away before getting vaccinated. You can get Shingrix whether or not you remember having had chickenpox in the past. Studies show that more than 99% of Americans 40 years and older have had chickenpox, even if they don't remember having the disease. Chickenpox and shingles are related because they are caused by the same virus (varicella zoster virus). After a  person recovers from chickenpox, the virus stays dormant (inactive) in the body. It can reactivate years later and cause shingles. If you had Zostavax in the recent past, you should wait at least eight weeks before getting Shingrix. Talk to your healthcare provider to determine the best time to get Shingrix. Shingrix is available in [facility] and pharmacies. To find doctor's offices or pharmacies near you that offer the vaccine, visit HealthMap Vaccine FinderExternal. If you have questions about Shingrix, talk with your healthcare provider. Vaccine for Those 81 Years and Older  Shingrix reduces the risk of shingles and PHN by more than 90% in people 46 and older. CDC recommends the vaccine for healthy adults 42 and older.  Who Should Not Get Shingrix? You should not get Shingrix if you: . have ever had a severe allergic reaction to any component of the vaccine or after a dose of Shingrix  . tested negative for immunity to varicella zoster virus. If you test negative, you should get chickenpox vaccine.  . currently have shingles  . currently are pregnant or breastfeeding. Women who are pregnant or breastfeeding should wait to get  Shingrix.  Marland Kitchen receive specific antiviral drugs (acyclovir, famciclovir, or valacyclovir) 24 hours before vaccination (avoid use of these antiviral drugs for 14 days after vaccination)- zoster vaccine live only If you have a minor acute (starts suddenly) illness, such as a cold, you may get Shingrix. But if you have a moderate or severe acute illness, you should usually wait until you recover before getting the vaccine. This includes anyone with a temperature of 101.44F or higher. The side effects of the Shingrix are temporary, and usually last 2 to 3 days. While you may experience pain for a few days after getting Shingrix, the pain will be less severe than having shingles and the complications from the disease. How Well Does Shingrix Work? Two doses of Shingrix provides strong protection against shingles and postherpetic neuralgia (PHN), the most common complication of shingles. . In adults 1 to 64 years old who got two doses, Shingrix was 97% effective in preventing shingles; among adults 70 years and older, Shingrix was 91% effective.  . In adults 79 to 64 years old who got two doses, Shingrix was 91% effective in preventing PHN; among adults 70 years and older, Shingrix was 89% effective. Shingrix protection remained high (more than 85%) in people 70 years and older throughout the four years following vaccination. Since your risk of shingles and PHN increases as you get older, it is important to have strong protection against shingles in your older years. Top of Page  What Are the Possible Side Effects of Shingrix? Studies show that Shingrix is safe. The vaccine helps your body create a strong defense against shingles. As a result, you are likely to have temporary side effects from getting the shots. The side effects may affect your ability to do normal daily activities for 2 to 3 days. Most people got a sore arm with mild or moderate pain after getting Shingrix, and some also had redness and  swelling where they got the shot. Some people felt tired, had muscle pain, a headache, shivering, fever, stomach pain, or nausea. About 1 out of 6 people who got Shingrix experienced side effects that prevented them from doing regular activities. Symptoms went away on their own in about 2 to 3 days. Side effects were more common in younger people. You might have a reaction to the first or second dose of Shingrix, or both  doses. If you experience side effects, you may choose to take over-the-counter pain medicine such as ibuprofen or acetaminophen. If you experience side effects from Shingrix, you should report them to the Vaccine Adverse Event Reporting System (VAERS). Your doctor might file this report, or you can do it yourself through the VAERS websiteExternal, or by calling [phone]. If you have any questions about side effects from Shingrix, talk with your doctor. The shingles vaccine does not contain thimerosal (a preservative containing mercury). Top of Page  When Should I See a Doctor Because of the Side Effects I Experience From Shingrix? In clinical trials, Shingrix was not associated with serious adverse events. In fact, serious side effects from vaccines are extremely rare. For example, for every 1 million doses of a vaccine given, only one or two people may have a severe allergic reaction. Signs of an allergic reaction happen within minutes or hours after vaccination and include hives, swelling of the face and throat, difficulty breathing, a fast heartbeat, dizziness, or weakness. If you experience these or any other life-threatening symptoms, see a doctor right away. Shingrix causes a strong response in your immune system, so it may produce short-term side effects more intense than you are used to from other vaccines. These side effects can be uncomfortable, but they are expected and usually go away on their own in 2 or 3 days. Top of Page  How Can I Pay For Shingrix? There are several  ways shingles vaccine may be paid for: Medicare . Medicare Part D plans cover the shingles vaccine, but there may be a cost to you depending on your plan. There may be a copay for the vaccine, or you may need to pay in full then get reimbursed for a certain amount.  . Medicare Part B does not cover the shingles vaccine. Medicaid . Medicaid may or may not cover the vaccine. Contact your insurer to find out. Private health insurance . Many private health insurance plans will cover the vaccine, but there may be a cost to you depending on your plan. Contact your insurer to find out. Vaccine assistance programs . Some pharmaceutical companies provide vaccines to eligible adults who cannot afford them. You may want to check with the vaccine manufacturer, [employer], about Shingrix. If you do not currently have health insurance, learn more about affordable health coverage optionsExternal. To find doctor's offices or pharmacies near you that offer the vaccine, visit HealthMap Vaccine FinderExternal.

## 2019-05-03 LAB — COMPLETE METABOLIC PANEL WITH GFR
AG Ratio: 1.9 (calc) (ref 1.0–2.5)
ALT: 22 U/L (ref 9–46)
AST: 18 U/L (ref 10–35)
Albumin: 4.2 g/dL (ref 3.6–5.1)
Alkaline phosphatase (APISO): 52 U/L (ref 35–144)
BUN/Creatinine Ratio: 12 (calc) (ref 6–22)
BUN: 16 mg/dL (ref 7–25)
CO2: 28 mmol/L (ref 20–32)
Calcium: 9.7 mg/dL (ref 8.6–10.3)
Chloride: 104 mmol/L (ref 98–110)
Creat: 1.31 mg/dL — ABNORMAL HIGH (ref 0.70–1.25)
GFR, Est African American: 66 mL/min/{1.73_m2} (ref >=60)
GFR, Est Non African American: 57 mL/min/{1.73_m2} — ABNORMAL LOW (ref >=60)
Globulin: 2.2 g/dL (calc) (ref 1.9–3.7)
Glucose, Bld: 111 mg/dL — ABNORMAL HIGH (ref 65–99)
Potassium: 5 mmol/L (ref 3.5–5.3)
Sodium: 136 mmol/L (ref 135–146)
Total Bilirubin: 0.5 mg/dL (ref 0.2–1.2)
Total Protein: 6.4 g/dL (ref 6.1–8.1)

## 2019-05-03 LAB — CBC WITH DIFFERENTIAL/PLATELET
Absolute Monocytes: 676 cells/uL (ref 200–950)
Basophils Absolute: 19 cells/uL (ref 0–200)
Basophils Relative: 0.3 %
Eosinophils Absolute: 161 cells/uL (ref 15–500)
Eosinophils Relative: 2.6 %
HCT: 44.7 % (ref 38.5–50.0)
Hemoglobin: 15.3 g/dL (ref 13.2–17.1)
Lymphs Abs: 1048 cells/uL (ref 850–3900)
MCH: 32.1 pg (ref 27.0–33.0)
MCHC: 34.2 g/dL (ref 32.0–36.0)
MCV: 93.9 fL (ref 80.0–100.0)
MPV: 11.6 fL (ref 7.5–12.5)
Monocytes Relative: 10.9 %
Neutro Abs: 4297 cells/uL (ref 1500–7800)
Neutrophils Relative %: 69.3 %
Platelets: 199 10*3/uL (ref 140–400)
RBC: 4.76 10*6/uL (ref 4.20–5.80)
RDW: 12.1 % (ref 11.0–15.0)
Total Lymphocyte: 16.9 %
WBC: 6.2 10*3/uL (ref 3.8–10.8)

## 2019-05-03 LAB — URINALYSIS, ROUTINE W REFLEX MICROSCOPIC
Bilirubin Urine: NEGATIVE
Glucose, UA: NEGATIVE
Hgb urine dipstick: NEGATIVE
Ketones, ur: NEGATIVE
Leukocytes,Ua: NEGATIVE
Nitrite: NEGATIVE
Protein, ur: NEGATIVE
Specific Gravity, Urine: 1.02 (ref 1.001–1.03)
pH: <=5 (ref 5.0–8.0)

## 2019-05-03 LAB — VITAMIN D 25 HYDROXY (VIT D DEFICIENCY, FRACTURES): Vit D, 25-Hydroxy: 47 ng/mL (ref 30–100)

## 2019-05-03 LAB — HEMOGLOBIN A1C
Hgb A1c MFr Bld: 5.2 % of total Hgb (ref <5.7)
Mean Plasma Glucose: 103 (calc)
eAG (mmol/L): 5.7 (calc)

## 2019-05-03 LAB — LIPID PANEL
Cholesterol: 214 mg/dL — ABNORMAL HIGH (ref <200)
HDL: 36 mg/dL — ABNORMAL LOW (ref >=40)
LDL Cholesterol (Calc): 122 mg/dL (calc) — ABNORMAL HIGH
Non-HDL Cholesterol (Calc): 178 mg/dL (calc) — ABNORMAL HIGH (ref <130)
Total CHOL/HDL Ratio: 5.9 (calc) — ABNORMAL HIGH (ref <5.0)
Triglycerides: 392 mg/dL — ABNORMAL HIGH (ref <150)

## 2019-05-03 LAB — HIV ANTIBODY (ROUTINE TESTING W REFLEX): HIV 1&2 Ab, 4th Generation: NONREACTIVE

## 2019-05-03 LAB — MICROALBUMIN / CREATININE URINE RATIO
Creatinine, Urine: 138 mg/dL (ref 20–320)
Microalb, Ur: <0.2 mg/dL

## 2019-05-03 LAB — PSA: PSA: 0.5 ng/mL (ref <=4.0)

## 2019-05-03 LAB — IRON, TOTAL/TOTAL IRON BINDING CAP
%SAT: 43 % (calc) (ref 20–48)
Iron: 149 ug/dL (ref 50–180)
TIBC: 345 mcg/dL (calc) (ref 250–425)

## 2019-05-03 LAB — HEPATITIS C ANTIBODY
Hepatitis C Ab: NONREACTIVE
SIGNAL TO CUT-OFF: 0.02 (ref <1.00)

## 2019-05-03 LAB — MAGNESIUM: Magnesium: 2.1 mg/dL (ref 1.5–2.5)

## 2019-05-03 LAB — TESTOSTERONE: Testosterone: 305 ng/dL (ref 250–827)

## 2019-05-03 LAB — TSH: TSH: 3.59 mIU/L (ref 0.40–4.50)

## 2019-05-03 LAB — VITAMIN B12: Vitamin B-12: 203 pg/mL (ref 200–1100)

## 2019-06-04 ENCOUNTER — Other Ambulatory Visit: Payer: Self-pay

## 2019-06-04 ENCOUNTER — Other Ambulatory Visit: Payer: Self-pay | Admitting: Physician Assistant

## 2019-06-04 DIAGNOSIS — E782 Mixed hyperlipidemia: Secondary | ICD-10-CM | POA: Diagnosis not present

## 2019-06-04 DIAGNOSIS — R5383 Other fatigue: Secondary | ICD-10-CM

## 2019-06-04 DIAGNOSIS — E538 Deficiency of other specified B group vitamins: Secondary | ICD-10-CM

## 2019-06-04 DIAGNOSIS — Z79899 Other long term (current) drug therapy: Secondary | ICD-10-CM

## 2019-06-05 LAB — COMPLETE METABOLIC PANEL WITH GFR
AG Ratio: 1.9 (calc) (ref 1.0–2.5)
ALT: 33 U/L (ref 9–46)
AST: 24 U/L (ref 10–35)
Albumin: 4 g/dL (ref 3.6–5.1)
Alkaline phosphatase (APISO): 66 U/L (ref 35–144)
BUN: 16 mg/dL (ref 7–25)
CO2: 24 mmol/L (ref 20–32)
Calcium: 9.4 mg/dL (ref 8.6–10.3)
Chloride: 105 mmol/L (ref 98–110)
Creat: 1.23 mg/dL (ref 0.70–1.25)
GFR, Est African American: 71 mL/min/{1.73_m2} (ref >=60)
GFR, Est Non African American: 62 mL/min/{1.73_m2} (ref >=60)
Globulin: 2.1 g/dL (calc) (ref 1.9–3.7)
Glucose, Bld: 95 mg/dL (ref 65–99)
Potassium: 4.4 mmol/L (ref 3.5–5.3)
Sodium: 138 mmol/L (ref 135–146)
Total Bilirubin: 0.3 mg/dL (ref 0.2–1.2)
Total Protein: 6.1 g/dL (ref 6.1–8.1)

## 2019-06-05 LAB — VITAMIN B12: Vitamin B-12: 507 pg/mL (ref 200–1100)

## 2019-06-18 ENCOUNTER — Other Ambulatory Visit: Payer: Self-pay | Admitting: Internal Medicine

## 2019-07-15 ENCOUNTER — Encounter: Payer: Self-pay | Admitting: Gastroenterology

## 2019-08-05 NOTE — Progress Notes (Signed)
Assessment and Plan:   Bicuspid aortic valve Continue follow up cardio  Borderline systolic HTN - continue medications, DASH diet, exercise and monitor at home. Call if greater than 130/80.  -     CBC with Differential/Platelet -     BASIC METABOLIC PANEL WITH GFR -     Hepatic function panel -     TSH  Chronic obstructive pulmonary disease, unspecified COPD type ([place]) No triggers, well controlled symptoms, cont to monitor Advised to stop smoking  Mixed hyperlipidemia -continue medications, check lipids, decrease fatty foods, increase activity.  -     Lipid panel  Medication management     Continue diet and meds as discussed. Further disposition pending results of labs. Future Appointments  Date Time Provider [place]  05/07/2020  9:00 AM [name], PA-C [id number] None    HPI 63 y.o. male  presents for 3 month follow up with hypertension, hyperlipidemia, prediabetes and vitamin D.   His blood pressure has been controlled at home, today their BP is BP: 120/80.   He does not workout he is now retired.  He denies chest pain, shortness of breath, dizziness.  Has bicuspid valve, follows with Cardio, sees pain management for rib pain, switched from hydrocodone to oxycodone and movantik, has tried lyrica/gabpentin/cybalata/symbicort without help.  Prednisone states it did help. May try spinal stiumlator.    He has COPD, smokes, has quit for 5 years but started back, he is smoking 1/2 pack occ he will not smoke at all. Will smoke more at poker games. He is drinking some alcohol every day, couple of beers.     He is on cholesterol medication, crestor 5 mg and denies myalgias. His cholesterol is not at goal. The cholesterol last visit was:   Lab Results  Component Value Date   CHOL 214 (H) 05/02/2019   HDL 36 (L) 05/02/2019   LDLCALC 122 (H) 05/02/2019   TRIG 392 (H) 05/02/2019   CHOLHDL 5.9 (H) 05/02/2019    He has been working on diet and exercise for  prediabetes, and denies foot ulcerations, hyperglycemia, hypoglycemia , increased appetite, nausea, paresthesia of the feet, polydipsia, polyuria, visual disturbances, vomiting and weight loss. Last A1C in the office was:  Lab Results  Component Value Date   HGBA1C 5.2 05/02/2019   Patient is on Vitamin D supplement.  Lab Results  Component Value Date   VD25OH 47 05/02/2019   BMI is Body mass index is 30.33 kg/m., he is working on diet and exercise. Wt Readings from Last 3 Encounters:  08/06/19 236 lb 3.2 oz (107.1 kg)  05/02/19 237 lb (107.5 kg)  02/13/19 234 lb (106.1 kg)    Current Medications:  Current Outpatient Medications on File Prior to Visit  Medication Sig Dispense Refill  . acetaminophen (TYLENOL) 500 MG tablet Take 1,500 mg by mouth every 6 (six) hours as needed for mild pain.    Marland Kitchen aspirin 81 MG tablet Take 81 mg by mouth daily.     . Cholecalciferol (VITAMIN D) 2000 UNITS CAPS Take 1 capsule by mouth daily.     . Flaxseed, Linseed, (FLAXSEED OIL) 1200 MG CAPS Take 1 capsule by mouth daily.    . Omega-3 Fatty Acids (FISH OIL PO) Take 1 tablet by mouth daily.    . OXYCODONE HCL PO Take by mouth.    . rosuvastatin (CRESTOR) 5 MG tablet Take 1 tablet (5 mg total) by mouth at bedtime. 30 tablet 11  . traZODone (DESYREL) 150 MG  tablet Take 1/2 to 1 tablet 1 hour before Bedtime if needed for Sleep 90 tablet 1  . MOVANTIK 25 MG TABS tablet Take 25 mg by mouth daily.     No current facility-administered medications on file prior to visit.     Medical History:  Past Medical History:  Diagnosis Date  . Abnormal CT scan, chest    Blebs on CT chest but NORMAL PFTs- no COPD  . Allergy   . Aortic stenosis   . Bicuspid aortic valve    Echo 2014  EF 55-60% Mild to moderate AR mild AS with mean gradient 18 mmHg and peak of 25 mmHg. ? Bicuspid valve with dilated aortic root 4.0 cm.  . diverticulosis   . DJD (degenerative joint disease)   . ED (erectile dysfunction)   .  Elevated blood pressure reading without diagnosis of hypertension   . Heart murmur   . Hx of colonic polyp 2015  . Hypercholesteremia   . Skin cancer    Patient denies     Allergies:  Allergies  Allergen Reactions  . Morphine Itching    Can take hydrocodone without issues     Review of Systems:  Review of Systems  Constitutional: Negative for chills, fever and malaise/fatigue.  HENT: Negative for congestion, ear pain and sore throat.   Eyes: Negative.   Respiratory: Negative for cough, hemoptysis and shortness of breath.   Cardiovascular: Negative for chest pain, palpitations and leg swelling.  Gastrointestinal: Positive for abdominal pain. Negative for blood in stool, constipation, diarrhea, heartburn, melena, nausea and vomiting.  Genitourinary: Negative.   Skin: Negative.   Neurological: Negative for dizziness, sensory change, loss of consciousness and headaches.  Psychiatric/Behavioral: Negative for depression. The patient is not nervous/anxious and does not have insomnia.     Family history- Review and unchanged  Social history- Review and unchanged  Physical Exam: BP 120/80   Pulse 86   Temp (!) 97.5 F (36.4 C)   Ht 6\' 2"  (1.88 m)   Wt 236 lb 3.2 oz (107.1 kg)   SpO2 99%   BMI 30.33 kg/m  Wt Readings from Last 3 Encounters:  08/06/19 236 lb 3.2 oz (107.1 kg)  05/02/19 237 lb (107.5 kg)  02/13/19 234 lb (106.1 kg)    General Appearance: Well nourished well developed, in no apparent distress. Eyes: PERRLA, EOMs, conjunctiva no swelling or erythema ENT/Mouth: Ear canals normal without obstruction, swelling, erythma, discharge.  TMs normal bilaterally.  Oropharynx moist, clear, without exudate, or postoropharyngeal swelling. Neck: Supple, thyroid normal,no cervical adenopathy  Respiratory: Respiratory effort normal, Breath sounds clear A&P without rhonchi, wheeze, or rale.  No retractions, no accessory usage. Cardio: RRR with no MRGs. Brisk peripheral pulses  without edema.  Abdomen: Soft, + BS,  Non tender, no guarding, rebound, hernias, masses. Musculoskeletal: Full ROM, 5/5 strength, Normal gait Skin: Warm, dry without rashes, lesions, ecchymosis.  Neuro: Awake and oriented X 3, Cranial nerves intact. Normal muscle tone, no cerebellar symptoms. Psych: Normal affect, Insight and Judgment appropriate.    [name], PA-C 11:32 AM [facility]

## 2019-08-06 ENCOUNTER — Ambulatory Visit: Payer: BLUE CROSS/BLUE SHIELD | Admitting: Physician Assistant

## 2019-08-06 ENCOUNTER — Encounter: Payer: Self-pay | Admitting: Physician Assistant

## 2019-08-06 ENCOUNTER — Other Ambulatory Visit: Payer: Self-pay

## 2019-08-06 VITALS — BP 120/80 | HR 86 | Temp 97.5°F | Ht 74.0 in | Wt 236.2 lb

## 2019-08-06 DIAGNOSIS — R030 Elevated blood-pressure reading, without diagnosis of hypertension: Secondary | ICD-10-CM | POA: Diagnosis not present

## 2019-08-06 DIAGNOSIS — Z79899 Other long term (current) drug therapy: Secondary | ICD-10-CM

## 2019-08-06 DIAGNOSIS — E559 Vitamin D deficiency, unspecified: Secondary | ICD-10-CM | POA: Diagnosis not present

## 2019-08-06 DIAGNOSIS — N529 Male erectile dysfunction, unspecified: Secondary | ICD-10-CM

## 2019-08-06 DIAGNOSIS — Q231 Congenital insufficiency of aortic valve: Secondary | ICD-10-CM | POA: Diagnosis not present

## 2019-08-06 DIAGNOSIS — E538 Deficiency of other specified B group vitamins: Secondary | ICD-10-CM | POA: Diagnosis not present

## 2019-08-06 DIAGNOSIS — J449 Chronic obstructive pulmonary disease, unspecified: Secondary | ICD-10-CM

## 2019-08-06 DIAGNOSIS — E782 Mixed hyperlipidemia: Secondary | ICD-10-CM | POA: Diagnosis not present

## 2019-08-06 DIAGNOSIS — R7309 Other abnormal glucose: Secondary | ICD-10-CM

## 2019-08-06 NOTE — Patient Instructions (Signed)
  SMOKING CESSATION  [employer]  [phone] for more information or for a free program for smoking cessation help.   You can call QUIT SMART 1-800-QUIT-NOW for free nicotine patches or replacement therapy- if they are out- keep calling  [facility] Can call for smoking cessation classes, [phone]  If you have a smart phone, please look up Smoke Free app, this will help you stay on track and give you information about money you have saved, life that you have gained back and a ton of more information.     ADVANTAGES OF QUITTING SMOKING  Within 20 minutes, blood pressure decreases. Your pulse is at normal level.  After 8 hours, carbon monoxide levels in the blood return to normal. Your oxygen level increases.  After 24 hours, the chance of having a heart attack starts to decrease. Your breath, hair, and body stop smelling like smoke.  After 48 hours, damaged nerve endings begin to recover. Your sense of taste and smell improve.  After 72 hours, the body is virtually free of nicotine. Your bronchial tubes relax and breathing becomes easier.  After 2 to 12 weeks, lungs can hold more air. Exercise becomes easier and circulation improves.  After 1 year, the risk of coronary heart disease is cut in half.  After 5 years, the risk of stroke falls to the same as a nonsmoker.  After 10 years, the risk of lung cancer is cut in half and the risk of other cancers decreases significantly.  After 15 years, the risk of coronary heart disease drops, usually to the level of a nonsmoker.  You will have extra money to spend on things other than cigarettes.

## 2019-08-07 LAB — CBC WITH DIFFERENTIAL/PLATELET
Absolute Monocytes: 814 cells/uL (ref 200–950)
Basophils Absolute: 29 cells/uL (ref 0–200)
Basophils Relative: 0.4 %
Eosinophils Absolute: 137 cells/uL (ref 15–500)
Eosinophils Relative: 1.9 %
HCT: 47 % (ref 38.5–50.0)
Hemoglobin: 16 g/dL (ref 13.2–17.1)
Lymphs Abs: 1174 cells/uL (ref 850–3900)
MCH: 31.6 pg (ref 27.0–33.0)
MCHC: 34 g/dL (ref 32.0–36.0)
MCV: 92.9 fL (ref 80.0–100.0)
MPV: 11 fL (ref 7.5–12.5)
Monocytes Relative: 11.3 %
Neutro Abs: 5047 cells/uL (ref 1500–7800)
Neutrophils Relative %: 70.1 %
Platelets: 186 10*3/uL (ref 140–400)
RBC: 5.06 10*6/uL (ref 4.20–5.80)
RDW: 13.3 % (ref 11.0–15.0)
Total Lymphocyte: 16.3 %
WBC: 7.2 10*3/uL (ref 3.8–10.8)

## 2019-08-07 LAB — LIPID PANEL
Cholesterol: 159 mg/dL (ref <200)
HDL: 28 mg/dL — ABNORMAL LOW (ref >=40)
Non-HDL Cholesterol (Calc): 131 mg/dL (calc) — ABNORMAL HIGH (ref <130)
Total CHOL/HDL Ratio: 5.7 (calc) — ABNORMAL HIGH (ref <5.0)
Triglycerides: 408 mg/dL — ABNORMAL HIGH (ref <150)

## 2019-08-07 LAB — COMPLETE METABOLIC PANEL WITH GFR
AG Ratio: 2 (calc) (ref 1.0–2.5)
ALT: 29 U/L (ref 9–46)
AST: 19 U/L (ref 10–35)
Albumin: 4.3 g/dL (ref 3.6–5.1)
Alkaline phosphatase (APISO): 69 U/L (ref 35–144)
BUN/Creatinine Ratio: 13 (calc) (ref 6–22)
BUN: 17 mg/dL (ref 7–25)
CO2: 24 mmol/L (ref 20–32)
Calcium: 9.9 mg/dL (ref 8.6–10.3)
Chloride: 107 mmol/L (ref 98–110)
Creat: 1.31 mg/dL — ABNORMAL HIGH (ref 0.70–1.25)
GFR, Est African American: 66 mL/min/{1.73_m2} (ref >=60)
GFR, Est Non African American: 57 mL/min/{1.73_m2} — ABNORMAL LOW (ref >=60)
Globulin: 2.2 g/dL (calc) (ref 1.9–3.7)
Glucose, Bld: 103 mg/dL — ABNORMAL HIGH (ref 65–99)
Potassium: 4.4 mmol/L (ref 3.5–5.3)
Sodium: 139 mmol/L (ref 135–146)
Total Bilirubin: 0.5 mg/dL (ref 0.2–1.2)
Total Protein: 6.5 g/dL (ref 6.1–8.1)

## 2019-08-07 LAB — TSH: TSH: 2.81 mIU/L (ref 0.40–4.50)

## 2019-08-07 LAB — MAGNESIUM: Magnesium: 2.1 mg/dL (ref 1.5–2.5)

## 2019-08-07 LAB — VITAMIN B12: Vitamin B-12: 733 pg/mL (ref 200–1100)

## 2019-08-07 LAB — VITAMIN D 25 HYDROXY (VIT D DEFICIENCY, FRACTURES): Vit D, 25-Hydroxy: 57 ng/mL (ref 30–100)

## 2019-11-21 ENCOUNTER — Ambulatory Visit: Payer: BC Managed Care – PPO | Admitting: Adult Health

## 2019-11-21 ENCOUNTER — Encounter: Payer: Self-pay | Admitting: Adult Health

## 2019-11-21 ENCOUNTER — Other Ambulatory Visit: Payer: Self-pay

## 2019-11-21 VITALS — BP 124/76 | HR 88 | Temp 97.3°F | Wt 239.0 lb

## 2019-11-21 DIAGNOSIS — Z8719 Personal history of other diseases of the digestive system: Secondary | ICD-10-CM

## 2019-11-21 DIAGNOSIS — Z9889 Other specified postprocedural states: Secondary | ICD-10-CM

## 2019-11-21 DIAGNOSIS — R10813 Right lower quadrant abdominal tenderness: Secondary | ICD-10-CM | POA: Diagnosis not present

## 2019-11-21 DIAGNOSIS — Z86010 Personal history of colonic polyps: Secondary | ICD-10-CM | POA: Diagnosis not present

## 2019-11-21 DIAGNOSIS — R1031 Right lower quadrant pain: Secondary | ICD-10-CM | POA: Diagnosis not present

## 2019-11-21 NOTE — Patient Instructions (Signed)
   YOU CAN CALL TO MAKE AN ULTRASOUND..  I have put in an order for an ultrasound for you to have You can set them up at your convenience by calling this number [phone] You will likely have the ultrasound at [facility]  If you have any issues call our office and we will set this up for you.    Please go to the ER if you have any severe AB pain, unable to hold down food/water, blood in stool or vomit, chest pain, shortness of breath, or any worsening symptoms.   We will plan on referring you back to Dr. Redmond Pulling to discuss if this may be related to previous hernia surgery unless there is another explanation.        Abdominal Pain, Adult  Many things can cause belly (abdominal) pain. Most times, belly pain is not dangerous. Many cases of belly pain can be watched and treated at home. Sometimes belly pain is serious, though. Your doctor will try to find the cause of your belly pain. Follow these instructions at home:  Take over-the-counter and prescription medicines only as told by your doctor. Do not take medicines that help you poop (laxatives) unless told to by your doctor.  Drink enough fluid to keep your pee (urine) clear or pale yellow.  Watch your belly pain for any changes.  Keep all follow-up visits as told by your doctor. This is important. Contact a doctor if:  Your belly pain changes or gets worse.  You are not hungry, or you lose weight without trying.  You are having trouble pooping (constipated) or have watery poop (diarrhea) for more than 2-3 days.  You have pain when you pee or poop.  Your belly pain wakes you up at night.  Your pain gets worse with meals, after eating, or with certain foods.  You are throwing up and cannot keep anything down.  You have a fever. Get help right away if:  Your pain does not go away as soon as your doctor says it should.  You cannot stop throwing up.  Your pain is only in areas of your belly, such  as the right side or the left lower part of the belly.  You have bloody or black poop, or poop that looks like tar.  You have very bad pain, cramping, or bloating in your belly.  You have signs of not having enough fluid or water in your body (dehydration), such as: ? Dark pee, very little pee, or no pee. ? Cracked lips. ? Dry mouth. ? Sunken eyes. ? Sleepiness. ? Weakness. This information is not intended to replace advice given to you by your health care provider. Make sure you discuss any questions you have with your health care provider. Document Released: 05/23/2008 Document Revised: 06/24/2016 Document Reviewed: 05/18/2016 Elsevier Interactive Patient Education  [employer].

## 2019-11-22 ENCOUNTER — Ambulatory Visit
Admission: RE | Admit: 2019-11-22 | Discharge: 2019-11-22 | Disposition: A | Discharge status: Home / Self Care | Payer: BC Managed Care – PPO | Source: Ambulatory Visit | Attending: Adult Health | Admitting: Adult Health

## 2019-11-22 ENCOUNTER — Encounter: Payer: Self-pay | Admitting: Adult Health

## 2019-11-22 DIAGNOSIS — R1031 Right lower quadrant pain: Secondary | ICD-10-CM

## 2019-11-22 DIAGNOSIS — R10813 Right lower quadrant abdominal tenderness: Secondary | ICD-10-CM

## 2019-11-22 LAB — COMPLETE METABOLIC PANEL WITH GFR
AG Ratio: 2 (calc) (ref 1.0–2.5)
ALT: 42 U/L (ref 9–46)
AST: 23 U/L (ref 10–35)
Albumin: 4.7 g/dL (ref 3.6–5.1)
Alkaline phosphatase (APISO): 64 U/L (ref 35–144)
BUN: 14 mg/dL (ref 7–25)
CO2: 26 mmol/L (ref 20–32)
Calcium: 9.7 mg/dL (ref 8.6–10.3)
Chloride: 102 mmol/L (ref 98–110)
Creat: 1.17 mg/dL (ref 0.70–1.25)
GFR, Est African American: 76 mL/min/{1.73_m2} (ref >=60)
GFR, Est Non African American: 65 mL/min/{1.73_m2} (ref >=60)
Globulin: 2.4 g/dL (calc) (ref 1.9–3.7)
Glucose, Bld: 106 mg/dL — ABNORMAL HIGH (ref 65–99)
Potassium: 4.5 mmol/L (ref 3.5–5.3)
Sodium: 138 mmol/L (ref 135–146)
Total Bilirubin: 0.6 mg/dL (ref 0.2–1.2)
Total Protein: 7.1 g/dL (ref 6.1–8.1)

## 2019-11-22 LAB — URINALYSIS W MICROSCOPIC + REFLEX CULTURE
Bacteria, UA: NONE SEEN /HPF
Bilirubin Urine: NEGATIVE
Glucose, UA: NEGATIVE
Hgb urine dipstick: NEGATIVE
Hyaline Cast: NONE SEEN /LPF
Ketones, ur: NEGATIVE
Leukocyte Esterase: NEGATIVE
Nitrites, Initial: NEGATIVE
Protein, ur: NEGATIVE
Specific Gravity, Urine: 1.029 (ref 1.001–1.03)
Squamous Epithelial / HPF: NONE SEEN /HPF (ref <=5)
WBC, UA: NONE SEEN /HPF (ref 0–5)
pH: <=5 (ref 5.0–8.0)

## 2019-11-22 LAB — CBC WITH DIFFERENTIAL/PLATELET
Absolute Monocytes: 680 cells/uL (ref 200–950)
Basophils Absolute: 17 cells/uL (ref 0–200)
Basophils Relative: 0.2 %
Eosinophils Absolute: 84 cells/uL (ref 15–500)
Eosinophils Relative: 1 %
HCT: 49.4 % (ref 38.5–50.0)
Hemoglobin: 16.8 g/dL (ref 13.2–17.1)
Lymphs Abs: 1571 cells/uL (ref 850–3900)
MCH: 31.1 pg (ref 27.0–33.0)
MCHC: 34 g/dL (ref 32.0–36.0)
MCV: 91.5 fL (ref 80.0–100.0)
MPV: 11.7 fL (ref 7.5–12.5)
Monocytes Relative: 8.1 %
Neutro Abs: 6048 cells/uL (ref 1500–7800)
Neutrophils Relative %: 72 %
Platelets: 196 10*3/uL (ref 140–400)
RBC: 5.4 10*6/uL (ref 4.20–5.80)
RDW: 12.5 % (ref 11.0–15.0)
Total Lymphocyte: 18.7 %
WBC: 8.4 10*3/uL (ref 3.8–10.8)

## 2019-11-22 LAB — NO CULTURE INDICATED

## 2019-11-22 NOTE — Progress Notes (Signed)
Assessment and Plan:  Anthony Paul was seen today for abdominal pain.  Diagnoses and all orders for this visit:  Right groin pain/Right lower quadrant abdominal tenderness without rebound tenderness 6 week history of progressive RLQ/groin pain, notable for previous R inguinal surgery at this site History of persistent unexplained upper abdominal pain (unchanged) Patient is concerned whether this new pain may be related to previous inguinal hernia surgery (2015 by Dr. Redmond Pulling) Consider appendicitis though without fever/chills suspicion is low due to insidious onset, lack of rebound - will check CBC, [place] will hopefully be able to r/o No recent diverticulitis - s/p partial colonic resection without recurrent episodes, mild diverticuloses on transverse and left colon per 2019 colonoscopy  CT was discussed though in light of numerous CT history deferred at this time to limit radiation exposure unless necessary for further characterization Patient will set up due follow up colonoscopy with Dr. Havery Moros Please go to the ER if you have any severe AB pain, unable to hold down food/water, blood in stool or vomit, chest pain, shortness of breath, or any worsening symptoms.  Will plan to refer back to Dr. Redmond Pulling for his input on whether this pain may be explained by his previous surgery  -     CBC with Diff -     COMPLETE METABOLIC PANEL WITH GFR -     Urinalysis w microscopic + reflex cultur -     US Pelvis Limited; Future  History of right inguinal hernia repair Current pain possibly related; if remains unexplained will refer back to Dr. Redmond Pulling for his input  History of colon polyps Due for follow up colonoscopy; currently in process of scheduling this with Dr. Havery Moros  Further disposition pending results of labs. Discussed med's effects and SE's.   Over 30 minutes of exam, counseling, chart review, and critical decision making was performed.   Future Appointments  Date Time Provider [place]  11/22/2019  2:00 PM [facility] [place] [facility]  12/06/2019 11:00 AM [name], PA-C [facility] None  05/07/2020  9:00 AM [name], PA-C [facility] None    ------------------------------------------------------------------------------------------------------------------   HPI BP 124/76   Pulse 88   Temp (!) 97.3 F (36.3 C)   Wt 239 lb (108.4 kg)   SpO2 99%   BMI 30.69 kg/m   63 y.o.male current smoker, chronic upper abdominal pain, diverticulosis/diverticulitis presents for 4-6 weeks of progressive abdominal/R groin pain. He reports pain has been gradual, waxing and waning, seems to be progressively worse and now constant. Describes pain as tenderness, currently 4/10, non-radiating/localized, now does have persistently, up to 8/10.   He denies notable aggravating factors though did seem worse yesterday after he pulled his trash can in. Reclining seems to help some, as does passing gas or having BM though doesn't resolve discomfort. Denies n/v/d, fever or chills. Denies urinary sx. Denies blood or black stools. He is on movantik for opiod related constipation, does have daily BMs, normal stools.  He had right inguinal hernia repair by Dr. Redmond Pulling in 2015 in this area, has done well since without bulging, but current pain is right over incision and he wonders if this is related. He does have hx of diverticulitis, had partial colectomy in 2012, "took 8 inches" and hasn't had flare since this time.   He follows with pain clinic next door due to unexplained persistent upper abdominal pain, generalized upper, bilaterally; is prescribed oxycodone/acet taking 10-325 mg tabs 4-5 times daily. This is unchanged.   He has  had numerous workups due to abdominal pain, had CT abd/pelvis in 02/19/2019 which was grossly normal excepting mild diverticulosis without evidence of diverticulitis. He follows with Dr. Havery Moros for GI, hx of colon polyps, last colonoscopy 08/16/2018 showed 3  polyps and was recommended 1 year follow up due to poor colon prep, he is in process of scheduling this. He was noted to have diverticulosis of transverse and left colon at that time.   Denies urinary changes, does endorse weak urine stream for the past year or so, denies straining. Nocturia 1-2 times at night. Lab Results  Component Value Date   PSA 0.5 05/02/2019   PSA 0.5 04/30/2018   PSA 3.4 04/11/2017     Past Medical History:  Diagnosis Date  . Abnormal CT scan, chest    Blebs on CT chest but NORMAL PFTs- no COPD  . Allergy   . Aortic stenosis   . Bicuspid aortic valve    Echo 2014  EF 55-60% Mild to moderate AR mild AS with mean gradient 18 mmHg and peak of 25 mmHg. ? Bicuspid valve with dilated aortic root 4.0 cm.  . diverticulosis   . DJD (degenerative joint disease)   . ED (erectile dysfunction)   . Elevated blood pressure reading without diagnosis of hypertension   . Heart murmur   . Hx of colonic polyp 2015  . Hypercholesteremia   . Skin cancer    Patient denies      Allergies  Allergen Reactions  . Morphine Itching    Can take hydrocodone without issues    Current Outpatient Medications on File Prior to Visit  Medication Sig  . acetaminophen (TYLENOL) 500 MG tablet Take 1,500 mg by mouth every 6 (six) hours as needed for mild pain.  Marland Kitchen aspirin 81 MG tablet Take 81 mg by mouth daily.   . Cholecalciferol (VITAMIN D) 2000 UNITS CAPS Take 1 capsule by mouth daily.   . Flaxseed, Linseed, (FLAXSEED OIL) 1200 MG CAPS Take 1 capsule by mouth daily.  Marland Kitchen MOVANTIK 25 MG TABS tablet Take 25 mg by mouth daily.  . Omega-3 Fatty Acids (FISH OIL PO) Take 1 tablet by mouth daily.  . OXYCODONE HCL PO Take by mouth.  . rosuvastatin (CRESTOR) 5 MG tablet Take 1 tablet (5 mg total) by mouth at bedtime.  . traZODone (DESYREL) 150 MG tablet Take 1/2 to 1 tablet 1 hour before Bedtime if needed for Sleep   No current facility-administered medications on file prior to visit.      ROS: Review of Systems  Constitutional: Negative for chills, diaphoresis, fever, malaise/fatigue and weight loss.  HENT: Negative.   Eyes: Negative.   Respiratory: Negative for cough, sputum production, shortness of breath and wheezing.   Cardiovascular: Negative for chest pain and leg swelling.  Gastrointestinal: Positive for abdominal pain and constipation. Negative for blood in stool, diarrhea, heartburn, melena, nausea and vomiting.  Genitourinary: Negative for dysuria, flank pain, frequency, hematuria and urgency.  Musculoskeletal: Negative for back pain, falls and joint pain.  Skin: Negative for rash.  Neurological: Negative for headaches.  Psychiatric/Behavioral: Negative for substance abuse.    Physical Exam:  BP 124/76   Pulse 88   Temp (!) 97.3 F (36.3 C)   Wt 239 lb (108.4 kg)   SpO2 99%   BMI 30.69 kg/m   General Appearance: Well nourished, in no apparent distress. Eyes: conjunctiva no swelling or erythema ENT/Mouth: No erythema, swelling, or exudate on post pharynx.  Tonsils not swollen or  erythematous. Hearing normal.  Neck: Supple Respiratory: Respiratory effort normal, BS equal bilaterally without rales, rhonchi, wheezing or stridor.  Cardio: RRR with 2/6 mild systolic murmur. Brisk peripheral pulses without edema.  Abdomen: Soft, + BS x4.  Generalized upper abdominal tenderness, RLQ tenderness without guarding or rebound, no palpable masses. No palpable inguinal hernia (exam chaperoned by Chancy Hurter, CMA). Well healed midline incision and right lower quadrant incision without palpable abnormality.  Lymphatics: Non tender without lymphadenopathy.  Musculoskeletal: Full ROM, 5/5 strength, normal gait.  Skin: Warm, dry without rashes, lesions, ecchymosis.  Neuro: Normal muscle tone, Sensation intact.  Psych: Awake and oriented X 3, normal affect, Insight and Judgment appropriate.     Izora Ribas, NP 8:53 AM [facility]

## 2019-11-25 ENCOUNTER — Other Ambulatory Visit: Payer: Self-pay | Admitting: Adult Health

## 2019-11-25 DIAGNOSIS — Z9889 Other specified postprocedural states: Secondary | ICD-10-CM

## 2019-11-25 DIAGNOSIS — Z8719 Personal history of other diseases of the digestive system: Secondary | ICD-10-CM

## 2019-11-25 DIAGNOSIS — R1031 Right lower quadrant pain: Secondary | ICD-10-CM

## 2019-11-26 ENCOUNTER — Other Ambulatory Visit: Payer: Self-pay | Admitting: Adult Health

## 2019-11-26 DIAGNOSIS — K921 Melena: Secondary | ICD-10-CM

## 2019-12-03 ENCOUNTER — Encounter: Payer: Self-pay | Admitting: Gastroenterology

## 2019-12-04 NOTE — Progress Notes (Signed)
Assessment and Plan:   Bicuspid aortic valve Continue follow up cardio  Borderline systolic HTN - continue medications, DASH diet, exercise and monitor at home. Call if greater than 130/80.  -     CBC with Differential/Platelet -     BASIC METABOLIC PANEL WITH GFR -     Hepatic function panel -     TSH  Chronic obstructive pulmonary disease, unspecified COPD type ([place]) No triggers, well controlled symptoms, cont to monitor Advised to stop smoking  Mixed hyperlipidemia -continue medications, check lipids, decrease fatty foods, increase activity.  -     Lipid panel  Medication management  Black stool -     POC Hemoccult Bld/Stl (3-Cd Home Screen)- was negative   Continue diet and meds as discussed. Further disposition pending results of labs. Future Appointments  Date Time Provider [place]  12/31/2019 11:00 AM [facility]  01/14/2020  1:30 PM Armbruster, Carlota Raspberry, MD [facility]  05/07/2020  9:00 AM [name], PA-C [facility] None    HPI 63 y.o. male  presents for 3 month follow up with hypertension, hyperlipidemia, prediabetes and vitamin D.   His blood pressure has been controlled at home, today their BP is BP: 128/84.   He does not workout he is now retired.  He denies chest pain, shortness of breath, dizziness.  Has bicuspid valve, follows with Cardio, following every 2 years.   He sees pain management for rib pain, switched from hydrocodone to oxycodone and is on movantik, has tried lyrica/gabpentin/cybalata/symbicort without help.  Prednisone and massage helps. May try spinal stimulator, in the process now with Dr. Vira Blanco. Marland Kitchen   He had recent US for right lower pelvic pain where he had hernia repair that was normal, had CT AB/pelvic with contrast in March 2020 that was normal. Continues to have pain but has improved, he is on movantik for opioid induced constipation, has had some softer stools but consistency has not  changed, no nausea, fever, chills.    He has COPD, smokes, has quit for 5 years but started back, he is smoking 1/2 pack occ he will not smoke at all. Will smoke more at poker games.    He is on cholesterol medication, crestor 5 mg and denies myalgias. His cholesterol is not at goal. The cholesterol last visit was:   Lab Results  Component Value Date   CHOL 159 08/06/2019   HDL 28 (L) 08/06/2019   [place]  08/06/2019     Comment:     . LDL cholesterol not calculated. Triglyceride levels greater than 400 mg/dL invalidate calculated LDL results. . Reference range: <100 . Desirable range <100 mg/dL for primary prevention;   <70 mg/dL for patients with CHD or diabetic patients  with > or = 2 CHD risk factors. Marland Kitchen LDL-C is now calculated using the Martin-Hopkins  calculation, which is a validated novel method providing  better accuracy than the Friedewald equation in the  estimation of LDL-C.  Cresenciano Genre et al. Annamaria Helling. [id number]): [id number]  (http://education.[facility].com/faq/FAQ164)    TRIG 408 (H) 08/06/2019   CHOLHDL 5.7 (H) 08/06/2019    He has been working on diet and exercise for prediabetes, and denies foot ulcerations, hyperglycemia, hypoglycemia , increased appetite, nausea, paresthesia of the feet, polydipsia, polyuria, visual disturbances, vomiting and weight loss. Last A1C in the office was:  Lab Results  Component Value Date   HGBA1C 5.2 05/02/2019   Patient is on Vitamin D supplement.  Lab Results  Component Value Date   VD25OH 57 08/06/2019   BMI is Body mass index is 30.56 kg/m., he is working on diet and exercise. Wt Readings from Last 3 Encounters:  12/06/19 238 lb (108 kg)  11/21/19 239 lb (108.4 kg)  08/06/19 236 lb 3.2 oz (107.1 kg)    Current Medications:  Current Outpatient Medications on File Prior to Visit  Medication Sig Dispense Refill  . acetaminophen (TYLENOL) 500 MG tablet Take 1,500 mg by mouth every 6 (six) hours as needed for mild  pain.    Marland Kitchen aspirin 81 MG tablet Take 81 mg by mouth daily.     . Cholecalciferol (VITAMIN D) 2000 UNITS CAPS Take 1 capsule by mouth daily.     . Flaxseed, Linseed, (FLAXSEED OIL) 1200 MG CAPS Take 1 capsule by mouth daily.    Marland Kitchen MOVANTIK 25 MG TABS tablet Take 25 mg by mouth daily.    . Omega-3 Fatty Acids (FISH OIL PO) Take 1 tablet by mouth daily.    . OXYCODONE HCL PO Take by mouth.    . rosuvastatin (CRESTOR) 5 MG tablet Take 1 tablet (5 mg total) by mouth at bedtime. 30 tablet 11  . traZODone (DESYREL) 150 MG tablet Take 1/2 to 1 tablet 1 hour before Bedtime if needed for Sleep 90 tablet 1  . vitamin C (ASCORBIC ACID) 250 MG tablet Take 250 mg by mouth daily.    Marland Kitchen zinc gluconate 50 MG tablet Take 50 mg by mouth daily.     No current facility-administered medications on file prior to visit.    Medical History:  Past Medical History:  Diagnosis Date  . Abnormal CT scan, chest    Blebs on CT chest but NORMAL PFTs- no COPD  . Allergy   . Aortic stenosis   . Bicuspid aortic valve    Echo 2014  EF 55-60% Mild to moderate AR mild AS with mean gradient 18 mmHg and peak of 25 mmHg. ? Bicuspid valve with dilated aortic root 4.0 cm.  . diverticulosis   . DJD (degenerative joint disease)   . ED (erectile dysfunction)   . Elevated blood pressure reading without diagnosis of hypertension   . Heart murmur   . Hx of colonic polyp 2015  . Hypercholesteremia   . Skin cancer    Patient denies     Allergies:  Allergies  Allergen Reactions  . Morphine Itching    Can take hydrocodone without issues     Review of Systems:  Review of Systems  Constitutional: Negative for chills, fever and malaise/fatigue.  HENT: Negative for congestion, ear pain and sore throat.   Eyes: Negative.   Respiratory: Negative for cough, hemoptysis and shortness of breath.   Cardiovascular: Negative for chest pain, palpitations and leg swelling.  Gastrointestinal: Positive for abdominal pain. Negative for  blood in stool, constipation, diarrhea, heartburn, melena, nausea and vomiting.  Genitourinary: Negative.   Skin: Negative.   Neurological: Negative for dizziness, sensory change, loss of consciousness and headaches.  Psychiatric/Behavioral: Negative for depression. The patient is not nervous/anxious and does not have insomnia.     Family history- Review and unchanged  Social history- Review and unchanged  Physical Exam: BP 128/84   Pulse 72   Temp (!) 97.4 F (36.3 C)   Resp 16   Ht 6\' 2"  (1.88 m)   Wt 238 lb (108 kg)   BMI 30.56 kg/m  Wt Readings from Last 3 Encounters:  12/06/19 238 lb (108 kg)  11/21/19 239 lb (108.4 kg)  08/06/19 236 lb 3.2 oz (107.1 kg)    General Appearance: Well nourished well developed, in no apparent distress. Eyes: PERRLA, EOMs, conjunctiva no swelling or erythema ENT/Mouth: Ear canals normal without obstruction, swelling, erythma, discharge.  TMs normal bilaterally.  Oropharynx moist, clear, without exudate, or postoropharyngeal swelling. Neck: Supple, thyroid normal,no cervical adenopathy  Respiratory: Respiratory effort normal, Breath sounds clear A&P without rhonchi, wheeze, or rale.  No retractions, no accessory usage. Cardio: RRR with no MRGs. Brisk peripheral pulses without edema.  Abdomen: Soft, + BS,  Non tender, no guarding, rebound, hernias, masses. Musculoskeletal: Full ROM, 5/5 strength, Normal gait Skin: Warm, dry without rashes, lesions, ecchymosis.  Neuro: Awake and oriented X 3, Cranial nerves intact. Normal muscle tone, no cerebellar symptoms. Psych: Normal affect, Insight and Judgment appropriate.    [name], PA-C 11:12 AM [facility]

## 2019-12-06 ENCOUNTER — Ambulatory Visit: Payer: BC Managed Care – PPO | Admitting: Physician Assistant

## 2019-12-06 ENCOUNTER — Other Ambulatory Visit: Payer: Self-pay

## 2019-12-06 VITALS — BP 128/84 | HR 72 | Temp 97.4°F | Resp 16 | Ht 74.0 in | Wt 238.0 lb

## 2019-12-06 DIAGNOSIS — Q231 Congenital insufficiency of aortic valve: Secondary | ICD-10-CM

## 2019-12-06 DIAGNOSIS — R7309 Other abnormal glucose: Secondary | ICD-10-CM

## 2019-12-06 DIAGNOSIS — E782 Mixed hyperlipidemia: Secondary | ICD-10-CM

## 2019-12-06 DIAGNOSIS — Z79899 Other long term (current) drug therapy: Secondary | ICD-10-CM | POA: Diagnosis not present

## 2019-12-06 DIAGNOSIS — J449 Chronic obstructive pulmonary disease, unspecified: Secondary | ICD-10-CM

## 2019-12-06 DIAGNOSIS — E559 Vitamin D deficiency, unspecified: Secondary | ICD-10-CM | POA: Diagnosis not present

## 2019-12-06 DIAGNOSIS — F17210 Nicotine dependence, cigarettes, uncomplicated: Secondary | ICD-10-CM

## 2019-12-06 DIAGNOSIS — Q2381 Bicuspid aortic valve: Secondary | ICD-10-CM

## 2019-12-06 DIAGNOSIS — K921 Melena: Secondary | ICD-10-CM

## 2019-12-06 LAB — POC HEMOCCULT BLD/STL (HOME/3-CARD/SCREEN)
Card #2 Fecal Occult Blod, POC: NEGATIVE
Card #3 Fecal Occult Blood, POC: NEGATIVE
Fecal Occult Blood, POC: NEGATIVE

## 2019-12-06 NOTE — Patient Instructions (Signed)
Pelvic Pain, Male  Pelvic pain is pain in your lower abdomen, below your belly button and between your hips. The pain may start suddenly (be acute), keep coming back (recur), or last a long time (become chronic). Pelvic pain that lasts longer than six months is considered chronic. There are many possible causes of pelvic pain. Sometimes, the cause is not known.  Pelvic pain may affect your:  · Prostate gland.  · Urinary system.  · Digestive tract.  · Musculoskeletal system. Strained muscles or ligaments may cause pelvic pain.  Follow these instructions at home:    Medicines  · Take over-the-counter and prescription medicines only as told by your health care provider.  · If you were prescribed an antibiotic medicine, take it as told by your health care provider. Do not stop taking the antibiotic even if you start to feel better.  Managing pain, stiffness, and swelling    · Take warm water baths (sitz baths). Sitz baths help with relaxing your pelvic floor muscles.  ? For a sitz bath, the water only comes up to your hips and covers your buttocks. A sitz bath may done at home in a bathtub or with a portable sitz bath that fits over the toilet.  · If directed, apply heat to the affected area before you exercise. Use the heat source that your health care provider recommends, such as a moist heat pack or a heating pad.  ? Place a towel between your skin and the heat source.  ? Leave the heat on for 20-30 minutes.  ? Remove the heat if your skin turns bright red. This is especially important if you are unable to feel pain, heat, or cold. You may have a greater risk of getting burned.  General instructions  · Rest as told by your health care provider.  · Keep a journal of your pelvic pain. Write down:  ? When the pain started.  ? Where the pain is located.  ? What seems to make the pain better or worse.  ? Any symptoms you have along with the pain.  · Follow your treatment plan as told by your health care provider. This may  include:  ? Pelvic physical therapy.  ? Yoga, meditation, and exercise.  ? Biofeedback. This process trains you to manage your body's response (physiological response) through breathing techniques and relaxation methods. You will work with a therapist while machines are used to monitor your physical symptoms.  ? Acupuncture. This is a type of treatment that involves stimulating specific points on your body by inserting thin needles through your skin to treat pain.  · Keep all follow-up visits as told by your health care provider. This is important.  Contact a health care provider if:  · Medicine does not help your pain.  · Your pain comes back.  · You have new symptoms.  · You have a fever or chills.  · You are constipated.  · You have blood in your urine or stool.  · You feel weak or light-headed.  Get help right away if:  · You have sudden severe pain.  · Your pain steadily gets worse.  · You have severe pain along with fever, nausea, vomiting, or excessive sweating.  Summary  · Pelvic pain is pain in your lower abdomen, below your belly button and between your hips. There are many possible causes of pelvic pain. Sometimes, the cause is not known.  · Take over-the-counter and prescription medicines only as told   Get help right away if you have severe pain along with fever, nausea, vomiting, or excessive sweating.  Keep all follow-up visits as told by your health care provider. This is important. This information is not intended to replace advice given to you by your health care provider. Make sure you discuss any questions you have with your health care provider. Document Released: 08/29/2012 Document Revised: 04/25/2018 Document Reviewed:  04/25/2018 Elsevier Patient Education  2020 [employer].

## 2019-12-07 LAB — MAGNESIUM: Magnesium: 2.1 mg/dL (ref 1.5–2.5)

## 2019-12-07 LAB — CBC WITH DIFFERENTIAL/PLATELET
Absolute Monocytes: 670 cells/uL (ref 200–950)
Basophils Absolute: 27 cells/uL (ref 0–200)
Basophils Relative: 0.4 %
Eosinophils Absolute: 121 cells/uL (ref 15–500)
Eosinophils Relative: 1.8 %
HCT: 44.6 % (ref 38.5–50.0)
Hemoglobin: 15.1 g/dL (ref 13.2–17.1)
Lymphs Abs: 1012 cells/uL (ref 850–3900)
MCH: 31.1 pg (ref 27.0–33.0)
MCHC: 33.9 g/dL (ref 32.0–36.0)
MCV: 92 fL (ref 80.0–100.0)
MPV: 11.5 fL (ref 7.5–12.5)
Monocytes Relative: 10 %
Neutro Abs: 4871 cells/uL (ref 1500–7800)
Neutrophils Relative %: 72.7 %
Platelets: 195 10*3/uL (ref 140–400)
RBC: 4.85 10*6/uL (ref 4.20–5.80)
RDW: 12.6 % (ref 11.0–15.0)
Total Lymphocyte: 15.1 %
WBC: 6.7 10*3/uL (ref 3.8–10.8)

## 2019-12-07 LAB — VITAMIN D 25 HYDROXY (VIT D DEFICIENCY, FRACTURES): Vit D, 25-Hydroxy: 58 ng/mL (ref 30–100)

## 2019-12-07 LAB — COMPLETE METABOLIC PANEL WITH GFR
AG Ratio: 1.9 (calc) (ref 1.0–2.5)
ALT: 25 U/L (ref 9–46)
AST: 20 U/L (ref 10–35)
Albumin: 4.4 g/dL (ref 3.6–5.1)
Alkaline phosphatase (APISO): 68 U/L (ref 35–144)
BUN/Creatinine Ratio: 9 (calc) (ref 6–22)
BUN: 12 mg/dL (ref 7–25)
CO2: 25 mmol/L (ref 20–32)
Calcium: 9.9 mg/dL (ref 8.6–10.3)
Chloride: 106 mmol/L (ref 98–110)
Creat: 1.31 mg/dL — ABNORMAL HIGH (ref 0.70–1.25)
GFR, Est African American: 66 mL/min/{1.73_m2} (ref >=60)
GFR, Est Non African American: 57 mL/min/{1.73_m2} — ABNORMAL LOW (ref >=60)
Globulin: 2.3 g/dL (calc) (ref 1.9–3.7)
Glucose, Bld: 112 mg/dL — ABNORMAL HIGH (ref 65–99)
Potassium: 4.9 mmol/L (ref 3.5–5.3)
Sodium: 140 mmol/L (ref 135–146)
Total Bilirubin: 0.5 mg/dL (ref 0.2–1.2)
Total Protein: 6.7 g/dL (ref 6.1–8.1)

## 2019-12-07 LAB — LIPID PANEL
Cholesterol: 146 mg/dL (ref <200)
HDL: 28 mg/dL — ABNORMAL LOW (ref >=40)
LDL Cholesterol (Calc): 84 mg/dL (calc)
Non-HDL Cholesterol (Calc): 118 mg/dL (calc) (ref <130)
Total CHOL/HDL Ratio: 5.2 (calc) — ABNORMAL HIGH (ref <5.0)
Triglycerides: 244 mg/dL — ABNORMAL HIGH (ref <150)

## 2019-12-07 LAB — TSH: TSH: 2.01 mIU/L (ref 0.40–4.50)

## 2019-12-09 ENCOUNTER — Other Ambulatory Visit: Payer: Self-pay | Admitting: Internal Medicine

## 2019-12-31 ENCOUNTER — Other Ambulatory Visit: Payer: Self-pay

## 2019-12-31 ENCOUNTER — Ambulatory Visit (AMBULATORY_SURGERY_CENTER): Payer: Self-pay | Admitting: *Deleted

## 2019-12-31 VITALS — Temp 98.6°F | Ht 74.0 in | Wt 241.6 lb

## 2019-12-31 DIAGNOSIS — Z86010 Personal history of colonic polyps: Secondary | ICD-10-CM

## 2019-12-31 DIAGNOSIS — Z01818 Encounter for other preprocedural examination: Secondary | ICD-10-CM

## 2019-12-31 MED ORDER — SUPREP BOWEL PREP KIT 17.5-3.13-1.6 GM/177ML PO SOLN: ORAL | 0 refills | Status: DC

## 2019-12-31 NOTE — Progress Notes (Signed)
Pt is aware that care partner will wait in the car during procedure; if they feel like they will be too hot or cold to wait in the car; they may wait in the 4 th floor lobby. Patient is aware to bring only one care partner. We want them to wear a mask (we do not have any that we can provide them), practice social distancing, and we will check their temperatures when they get here.  I did remind the patient that their care partner needs to stay in the parking lot the entire time and have a cell phone available, we will call them when the pt is ready for discharge. Patient will wear mask into building.  No egg or soy allergy  No home oxygen use or problems with anesthesia  No medications for weight loss taken  emmi information given  $15 off Suprep coupon  covid test 01-09-20 at 1:00 pm Pt instructed if they develop COVID symptoms, they should not go to have their test done; they should contact their PCP.  Also, they are told to let us know if they are sick and testing elsewhere.

## 2020-01-01 ENCOUNTER — Encounter: Payer: Self-pay | Admitting: Gastroenterology

## 2020-01-09 ENCOUNTER — Other Ambulatory Visit: Payer: Self-pay | Admitting: Gastroenterology

## 2020-01-09 ENCOUNTER — Ambulatory Visit (INDEPENDENT_AMBULATORY_CARE_PROVIDER_SITE_OTHER): Payer: BC Managed Care – PPO

## 2020-01-09 DIAGNOSIS — Z1159 Encounter for screening for other viral diseases: Secondary | ICD-10-CM

## 2020-01-10 LAB — SARS CORONAVIRUS 2 (TAT 6-24 HRS): SARS Coronavirus 2: NEGATIVE

## 2020-01-14 ENCOUNTER — Ambulatory Visit (AMBULATORY_SURGERY_CENTER): Payer: BC Managed Care – PPO | Admitting: Gastroenterology

## 2020-01-14 ENCOUNTER — Encounter: Payer: Self-pay | Admitting: Gastroenterology

## 2020-01-14 ENCOUNTER — Other Ambulatory Visit: Payer: Self-pay

## 2020-01-14 VITALS — BP 114/67 | HR 69 | Temp 97.7°F | Resp 15 | Ht 74.0 in | Wt 241.0 lb

## 2020-01-14 DIAGNOSIS — D120 Benign neoplasm of cecum: Secondary | ICD-10-CM | POA: Diagnosis not present

## 2020-01-14 DIAGNOSIS — Z86010 Personal history of colonic polyps: Secondary | ICD-10-CM

## 2020-01-14 MED ORDER — SODIUM CHLORIDE 0.9 % IV SOLN: 500.0000 mL | Freq: Once | INTRAVENOUS | Status: DC

## 2020-01-14 NOTE — Progress Notes (Signed)
Temp-JB VS-CW  Pt's states no medical or surgical changes since previsit or office visit.  

## 2020-01-14 NOTE — Op Note (Signed)
[place] Patient Name: Anthony Paul Procedure Date: 01/14/2020 1:30 PM MRN: [id number] Endoscopist: Remo Lipps P. Havery Moros , MD Age: 64 Referring MD:  Date of Birth: [birth year] Gender: Male Account #: [id number] Procedure:                Colonoscopy Indications:              High risk colon cancer surveillance: Personal                            history of colonic polyps (7 pre-cancerous polyps                            removed 07/2018 - fair prep on the last exam) Medicines:                Monitored Anesthesia Care Procedure:                Pre-Anesthesia Assessment:                           - Prior to the procedure, a History and Physical                            was performed, and patient medications and                            allergies were reviewed. The patient's tolerance of                            previous anesthesia was also reviewed. The risks                            and benefits of the procedure and the sedation                            options and risks were discussed with the patient.                            All questions were answered, and informed consent                            was obtained. Prior Anticoagulants: The patient has                            taken no previous anticoagulant or antiplatelet                            agents. ASA Grade Assessment: III - A patient with                            severe systemic disease. After reviewing the risks                            and benefits, the patient was deemed in  satisfactory condition to undergo the procedure.                           After obtaining informed consent, the colonoscope                            was passed under direct vision. Throughout the                            procedure, the patient's blood pressure, pulse, and                            oxygen saturations were monitored continuously. The                            Colonoscope  was introduced through the anus and                            advanced to the the cecum, identified by                            appendiceal orifice and ileocecal valve. The                            colonoscopy was performed without difficulty. The                            patient tolerated the procedure well. The quality                            of the bowel preparation was adequate. The                            ileocecal valve, appendiceal orifice, and rectum                            were photographed. Scope In: 1:33:01 PM Scope Out: 1:50:53 PM Scope Withdrawal Time: 0 hours 15 minutes 23 seconds  Total Procedure Duration: 0 hours 17 minutes 52 seconds  Findings:                 The perianal and digital rectal examinations were                            normal.                           A diminutive polyp was found in the cecum. The                            polyp was sessile. The polyp was removed with a                            cold snare. Resection and retrieval were complete.  Scattered small-mouthed diverticula were found in                            the transverse colon and left colon.                           Internal hemorrhoids were found during                            retroflexion, with stigmata of prior banding /                            scarring.                           The exam was otherwise without abnormality. Complications:            No immediate complications. Estimated blood loss:                            Minimal. Estimated Blood Loss:     Estimated blood loss was minimal. Impression:               - One diminutive polyp in the cecum, removed with a                            cold snare. Resected and retrieved.                           - Diverticulosis in the transverse colon and in the                            left colon.                           - Internal hemorrhoids.                           - The  examination was otherwise normal. Recommendation:           - Patient has a contact number available for                            emergencies. The signs and symptoms of potential                            delayed complications were discussed with the                            patient. Return to normal activities tomorrow.                            Written discharge instructions were provided to the                            patient.                           -  Resume previous diet.                           - Continue present medications.                           - Await pathology results. Anticipate repeat                            colonoscopy in 5 years given numerous pre-cancerous                            polyps removed in 2019 [place]. Len Azeez, MD 01/14/2020 1:56:07 PM This report has been signed electronically.

## 2020-01-14 NOTE — Progress Notes (Signed)
PT taken to PACU. Monitors in place. VSS. Report given to RN. 

## 2020-01-14 NOTE — Progress Notes (Signed)
Called to room to assist during endoscopic procedure.  Patient ID and intended procedure confirmed with present staff. Received instructions for my participation in the procedure from the performing physician.  

## 2020-01-14 NOTE — Patient Instructions (Signed)
YOU HAD AN ENDOSCOPIC PROCEDURE TODAY AT [facility]:   Refer to the procedure report that was given to you for any specific questions about what was found during the examination.  If the procedure report does not answer your questions, please call your gastroenterologist to clarify.  If you requested that your care partner not be given the details of your procedure findings, then the procedure report has been included in a sealed envelope for you to review at your convenience later.   **Handouts given on polyps and diverticulosis**  YOU SHOULD EXPECT: Some feelings of bloating in the abdomen. Passage of more gas than usual.  Walking can help get rid of the air that was put into your GI tract during the procedure and reduce the bloating. If you had a lower endoscopy (such as a colonoscopy or flexible sigmoidoscopy) you may notice spotting of blood in your stool or on the toilet paper. If you underwent a bowel prep for your procedure, you may not have a normal bowel movement for a few days.  Please Note:  You might notice some irritation and congestion in your nose or some drainage.  This is from the oxygen used during your procedure.  There is no need for concern and it should clear up in a day or so.  SYMPTOMS TO REPORT IMMEDIATELY:   Following lower endoscopy (colonoscopy or flexible sigmoidoscopy):  Excessive amounts of blood in the stool  Significant tenderness or worsening of abdominal pains  Swelling of the abdomen that is new, acute  Fever of 100F or higher  For urgent or emergent issues, a gastroenterologist can be reached at any hour by calling [phone].   DIET:  We do recommend a small meal at first, but then you may proceed to your regular diet.  Drink plenty of fluids but you should avoid alcoholic beverages for 24 hours.  ACTIVITY:  You should plan to take it easy for the rest of today and you should NOT DRIVE or use heavy machinery until tomorrow (because  of the sedation medicines used during the test).    FOLLOW UP: Our staff will call the number listed on your records 48-72 hours following your procedure to check on you and address any questions or concerns that you may have regarding the information given to you following your procedure. If we do not reach you, we will leave a message.  We will attempt to reach you two times.  During this call, we will ask if you have developed any symptoms of COVID 19. If you develop any symptoms (ie: fever, flu-like symptoms, shortness of breath, cough etc.) before then, please call [phone].  If you test positive for Covid 19 in the 2 weeks post procedure, please call and report this information to [place].    If any biopsies were taken you will be contacted by phone or by letter within the next 1-3 weeks.  Please call us at [phone] if you have not heard about the biopsies in 3 weeks.    SIGNATURES/CONFIDENTIALITY: You and/or your care partner have signed paperwork which will be entered into your electronic medical record.  These signatures attest to the fact that that the information above on your After Visit Summary has been reviewed and is understood.  Full responsibility of the confidentiality of this discharge information lies with you and/or your care-partner.

## 2020-01-16 ENCOUNTER — Telehealth: Payer: Self-pay | Admitting: *Deleted

## 2020-01-16 NOTE — Telephone Encounter (Signed)
  Follow up Call-  Call back number 01/14/2020 08/16/2018  Post procedure Call Back phone  # [phone]  Permission to leave phone message Yes Yes  Some recent data might be hidden     Patient questions:  Do you have a fever, pain , or abdominal swelling? No. Pain Score  0 *  Have you tolerated food without any problems? Yes.    Have you been able to return to your normal activities? Yes.    Do you have any questions about your discharge instructions: Diet   No. Medications  No. Follow up visit  No.  Do you have questions or concerns about your Care? No.  Actions: * If pain score is 4 or above: No action needed, pain <4.  1. Have you developed a fever since your procedure? no  2.   Have you had an respiratory symptoms (SOB or cough) since your procedure? no  3.   Have you tested positive for COVID 19 since your procedure no  4.   Have you had any family members/close contacts diagnosed with the COVID 19 since your procedure?  no   If yes to any of these questions please route to Joylene John, RN and Alphonsa Gin, Therapist, sports.

## 2020-01-27 ENCOUNTER — Other Ambulatory Visit: Payer: Self-pay | Admitting: General Surgery

## 2020-01-27 DIAGNOSIS — R1031 Right lower quadrant pain: Secondary | ICD-10-CM

## 2020-02-17 ENCOUNTER — Ambulatory Visit
Admission: RE | Admit: 2020-02-17 | Discharge: 2020-02-17 | Disposition: A | Discharge status: Home / Self Care | Payer: Medicare Other | Source: Ambulatory Visit | Attending: General Surgery | Admitting: General Surgery

## 2020-02-17 DIAGNOSIS — R1031 Right lower quadrant pain: Secondary | ICD-10-CM

## 2020-02-17 MED ORDER — GADOBENATE DIMEGLUMINE 529 MG/ML IV SOLN
20.0000 mL | Freq: Once | INTRAVENOUS | Status: AC | PRN
  Administered 2020-02-17: 20 mL via INTRAVENOUS

## 2020-02-22 ENCOUNTER — Other Ambulatory Visit: Payer: Self-pay | Admitting: Physician Assistant

## 2020-05-04 NOTE — Progress Notes (Addendum)
WELCOME TO MEDICARE  Assessment and Plan:  Bicuspid aortic valve Has yearly following, no symptoms at this time  Borderline systolic HTN - continue medications, DASH diet, exercise and monitor at home. Call if greater than 130/80.  -     CBC with Differential/Platelet -     COMPLETE METABOLIC PANEL WITH GFR -     TSH  COPD GOLD 0  Advised to stop smoking Continue follow up pulmonary  Mixed hyperlipidemia -continue CRESTOR - check lipids, decrease fatty foods, increase activity.  -     Lipid panel  Abnormal glucose Discussed disease progression and risks Discussed diet/exercise, weight management and risk modification  Medication management -     Magnesium  Vitamin D deficiency -     VITAMIN D 25 Hydroxy (Vit-D Deficiency, Fractures)  Cigarette smoker Smoking cessation-  instruction/counseling given, counseled patient on the dangers of tobacco use, advised patient to stop smoking, and reviewed strategies to maximize success, patient not ready to quit at this time.  DISCUSSED LOW DOSE CT, GREATER THAN 40 PACK YEAR SMOKING HISTORY, will place order -lung cancer screening with low dose CT discussed as recommended by guidelines based on age, number of pack year history.  Discussed risks of screening including but not limited to false positives on xray, further testing or consultation with specialist, and possible false negative CT as well. Understanding expressed and wishes to proceed with CT testing. Order placed.   Osteoarthritis, unspecified osteoarthritis type, unspecified site Continue pain management Has failed cymbalta, lyrica, gabaptentin Failed trigger point, failed spinal stimulator Massage and pressure belt helps  Medicare visit 1 year  BMI 30.0-30.9,adult  Overweight  - long discussion about weight loss, diet, and exercise -recommended diet heavy in fruits and veggies and low in animal meats, cheeses, and dairy products  Erectile dysfunction, unspecified  erectile dysfunction type -     Testosterone  Discussed med's effects and SE's. Screening labs and tests as requested with regular follow-up as recommended. Over 40 minutes of exam, counseling, chart review and critical decision making was performed Future Appointments  Date Time Provider [place]  06/23/2020  2:00 PM Josue Hector, MD [facility]  08/10/2020 10:00 AM [name], PA-C [facility] None  05/17/2021 10:00 AM [name], PA-C [facility] None   MEDICARE WELLNESS OBJECTIVES: Physical activity: Current Exercise Habits: The patient does not participate in regular exercise at present, Exercise limited by: orthopedic condition(s) Cardiac risk factors: Cardiac Risk Factors include: advanced age (>7men, >36 women);dyslipidemia;hypertension;male gender;sedentary lifestyle;smoking/ tobacco exposure Depression/mood screen:   Depression screen [facility] 2/9 05/07/2020  Decreased Interest 0  Down, Depressed, Hopeless 0  PHQ - 2 Score 0    ADLs:  In your present state of health, do you have any difficulty performing the following activities: 05/07/2020  Hearing? N  Vision? N  Comment has glasses  Difficulty concentrating or making decisions? N  Walking or climbing stairs? N  Dressing or bathing? N  Doing errands, shopping? N  Preparing Food and eating ? N  Using the Toilet? N  In the past six months, have you accidently leaked urine? N  Do you have problems with loss of bowel control? N  Managing your Medications? N  Managing your Finances? N  Housekeeping or managing your Housekeeping? N  Some recent data might be hidden     Cognitive Testing  Alert? Yes  Normal Appearance?Yes  Oriented to person? Yes  Place? Yes   Time? Yes  Recall of three objects?  Yes  Can perform simple calculations? Yes  Displays appropriate judgment?Yes  Can read the correct time from a watch face?Yes  EOL planning: Does Patient Have a Medical Advance Directive?: Yes Type  of Advance Directive: Healthcare Power of Attorney, Living will Does patient want to make changes to medical advance directive?: No - Patient declined Copy of [place] in Chart?: No - copy requested    Medicare Attestation I have personally reviewed: The patient's medical and social history Their use of alcohol, tobacco or illicit drugs Their current medications and supplements The patient's functional ability including ADLs,fall risks, home safety risks, cognitive, and hearing and visual impairment Diet and physical activities Evidence for depression or mood disorders  The patient's weight, height, BMI, and visual acuity have been recorded in the chart.  I have made referrals, counseling, and provided education to the patient based on review of the above and I have provided the patient with a written personalized care plan for preventive services.     HPI Patient presents for a WELCOME TO MEDICARE VISIT  He sees pain management for bilateral rib pain, states pain is getting worse, has tried lyrica/gabpentin without help,no help with Cymbalta. Pain is better in the AM and worse in the evening. Has had trigger points without help. No worsening pain with exercise or excretion, just helped daughter build deck and it did not make the pain worse. Had recent normal AB [place], and GI visit.  He continues to smoke, started age 24, smoked a pack a day from 12 yr to 43 years old 40+ pack year history, but states he has decreased 10 cigs, is not on an inhaler. Has not symptoms at this time.   His blood pressure has been controlled at home, today their BP is BP: 116/68   He has a bicuspid aortic valve, follows with cardiology, has appointment in July.  Echo 10/2018- EF normal mild AS and mild to moderate AR stable and stress test normal 09/2016. He does workout. He denies chest pain, shortness of breath, dizziness.   BMI is Body mass index is 30.69 kg/m., he is working on diet and  exercise. Wt Readings from Last 3 Encounters:  05/07/20 239 lb (108.4 kg)  01/14/20 241 lb (109.3 kg)  12/31/19 241 lb 9.6 oz (109.6 kg)   He is on cholesterol medication, rouvastatin 5mg  and denies myalgias. His cholesterol is not at goal less than 70. He is a smoker.  The cholesterol last visit was:   Lab Results  Component Value Date   CHOL 146 12/06/2019   HDL 28 (L) 12/06/2019   LDLCALC 84 12/06/2019   TRIG 244 (H) 12/06/2019   CHOLHDL 5.2 (H) 12/06/2019    Last A1C in the office was:  Lab Results  Component Value Date   HGBA1C 5.2 05/02/2019   Last GFR: Lab Results  Component Value Date   GFRNONAA 57 (L) 12/06/2019    Patient is on Vitamin D supplement.   Lab Results  Component Value Date   VD25OH 58 12/06/2019      Current Medications:     Current Outpatient Medications (Cardiovascular):  .  rosuvastatin (CRESTOR) 5 MG tablet, Take 1 tablet at bedtime for cholesterol.     Current Outpatient Medications (Analgesics):  .  acetaminophen (TYLENOL) 500 MG tablet, Take 1,500 mg by mouth every 6 (six) hours as needed for mild pain. Marland Kitchen  aspirin 81 MG tablet, Take 81 mg by mouth daily.  Marland Kitchen  oxyCODONE-acetaminophen (PERCOCET) 10-325 MG  tablet, Take 1 tablet by mouth 4 (four) times daily as needed.   Current Outpatient Medications (Hematological):  Marland Kitchen  Cyanocobalamin (VITAMIN B12 PO), Take by mouth daily.   Current Outpatient Medications (Other):  Marland Kitchen  Cholecalciferol (VITAMIN D) 2000 UNITS CAPS, Take 1 capsule by mouth daily. Takes 500 mg .  Flaxseed, Linseed, (FLAXSEED OIL) 1200 MG CAPS, Take 1 capsule by mouth daily. Marland Kitchen  MOVANTIK 25 MG TABS tablet, Take 25 mg by mouth daily. .  Omega-3 Fatty Acids (FISH OIL PO), Take 1 tablet by mouth daily. .  traZODone (DESYREL) 150 MG tablet, TAKE 1/2 TO 1 TABLET 1 HOUR BEFORE BEDTIME IF NEEDED FOR SLEEP (Patient taking differently: Take 1/2 to 1 tablet 1 hour before Bedtime if needed for Sleep- takes every night) .  vitamin C  (ASCORBIC ACID) 250 MG tablet, Take 250 mg by mouth daily. Marland Kitchen  zinc gluconate 50 MG tablet, Take 50 mg by mouth daily. .  finasteride (PROSCAR) 5 MG tablet, Take 1 tablet (5 mg total) by mouth daily.  Current Facility-Administered Medications (Other):  .  0.9 %  sodium chloride infusion  Allergies:  Allergies  Allergen Reactions  . Morphine Itching    Can take hydrocodone without issues   Health Maintenance:   Health Maintenance  Topic Date Due  . INFLUENZA VACCINE  07/19/2020  . COLONOSCOPY  01/13/2025  . TETANUS/TDAP  03/08/2025  . COVID-19 Vaccine  Completed  . Hepatitis C Screening  Completed  . HIV Screening  Completed  . PNA vac Low Risk Adult  Completed   Tetanus: 2016 Pneumovax:1997 TODAY Prevnar 13: 2014 Flu vaccine:2020 Zostavax: DUE needs new one COVID completed  DEXA: N/A Colonoscopy:  12/2019, Dr. Havery Moros, on bASA 5 year follow up EGD: N/A PFTs  04/2018 CT chest 03/2018 at [facility]- plaque on aortic arch CXR 2015 Echo 10/2017 Stress test 09/2016  Eye Exam: None Dentist: Dr. Jacquenette Shone  Patient Care Team: Unk Pinto, MD as PCP - General (Internal Medicine)  Medical History:  has COPD GOLD 0 ; Bicuspid aortic valve; Mixed hyperlipidemia; Borderline systolic HTN; ED (erectile dysfunction); DJD (degenerative joint disease); Abnormal glucose; Vitamin D deficiency; Medication management; Chest pain, atypical most c/w ibs ; Cigarette smoker; Chronic left shoulder pain; Abdominal pain; Trigger point of abdomen; and Congenital insufficiency of aortic valve on their problem list. Surgical History:  He  has a past surgical history that includes Colon surgery (Jan 2012); Tonsillectomy; Hemorroidectomy; Colonoscopy (01/13/2014); Upper gi endoscopy (2015); Inguinal hernia repair (Right, 2015); Resection distal clavical (Right); and Upper gastrointestinal endoscopy. Family History:  His family history includes Benign prostatic hyperplasia in his father; Cancer in  his sister; Diabetes in his mother; Heart disease in his father; Kidney disease in his sister. Social History:   reports that he has been smoking cigarettes. He started smoking about 48 years ago. He has a 70.50 pack-year smoking history. He quit smokeless tobacco use about 13 years ago.  His smokeless tobacco use included chew. He reports current alcohol use of about 5.0 standard drinks of alcohol per week. He reports that he does not use drugs.   Review of Systems:  Review of Systems  Constitutional: Negative.   HENT: Negative.   Eyes: Negative.   Respiratory: Negative.   Cardiovascular: Negative.   Gastrointestinal: Negative.   Genitourinary: Negative.   Musculoskeletal: Positive for myalgias.  Skin: Negative.     Physical Exam: Estimated body mass index is 30.69 kg/m as calculated from the following:   Height as  of this encounter: 6\' 2"  (1.88 m).   Weight as of this encounter: 239 lb (108.4 kg). BP 116/68   Pulse 78   Temp 98.1 F (36.7 C)   Ht 6\' 2"  (1.88 m)   Wt 239 lb (108.4 kg)   SpO2 96%   BMI 30.69 kg/m  General Appearance: Well nourished, in no apparent distress.  Eyes: PERRLA, EOMs, conjunctiva no swelling or erythema, normal fundi and vessels.  Sinuses: No Frontal/maxillary tenderness  ENT/Mouth: Ext aud canals clear, normal light reflex with TMs without erythema, bulging. Good dentition. No erythema, swelling, or exudate on post pharynx. Tonsils not swollen or erythematous. Hearing normal.  Neck: Supple, thyroid normal. No bruits  Respiratory: Respiratory effort normal, BS equal bilaterally without rales, rhonchi, wheezing or stridor.  Cardio: RRR without murmurs, rubs or gallops. Brisk peripheral pulses without edema.  Chest: symmetric, with normal excursions and percussion.  Abdomen: Soft, general tenderness, no guarding, rebound, hernias, masses, or organomegaly.  Lymphatics: Non tender without lymphadenopathy.  Genitourinary: defer Musculoskeletal: Full ROM  all peripheral extremities,5/5 strength, and normal gait.  Skin: Warm, dry without rashes, lesions, ecchymosis. Neuro: Cranial nerves intact, reflexes equal bilaterally. Normal muscle tone, no cerebellar symptoms. Sensation intact.  Psych: Awake and oriented X 3, normal affect, Insight and Judgment appropriate.   EKG: defer cardio  [name] 12:49 PM [facility]

## 2020-05-07 ENCOUNTER — Encounter: Payer: Self-pay | Admitting: Physician Assistant

## 2020-05-07 ENCOUNTER — Other Ambulatory Visit: Payer: Self-pay

## 2020-05-07 ENCOUNTER — Ambulatory Visit (INDEPENDENT_AMBULATORY_CARE_PROVIDER_SITE_OTHER): Payer: Medicare Other | Admitting: Physician Assistant

## 2020-05-07 VITALS — BP 116/68 | HR 78 | Temp 98.1°F | Ht 74.0 in | Wt 239.0 lb

## 2020-05-07 DIAGNOSIS — R030 Elevated blood-pressure reading, without diagnosis of hypertension: Secondary | ICD-10-CM | POA: Diagnosis not present

## 2020-05-07 DIAGNOSIS — E782 Mixed hyperlipidemia: Secondary | ICD-10-CM

## 2020-05-07 DIAGNOSIS — N401 Enlarged prostate with lower urinary tract symptoms: Secondary | ICD-10-CM

## 2020-05-07 DIAGNOSIS — Z23 Encounter for immunization: Secondary | ICD-10-CM

## 2020-05-07 DIAGNOSIS — Z0001 Encounter for general adult medical examination with abnormal findings: Secondary | ICD-10-CM

## 2020-05-07 DIAGNOSIS — N138 Other obstructive and reflux uropathy: Secondary | ICD-10-CM

## 2020-05-07 DIAGNOSIS — F17210 Nicotine dependence, cigarettes, uncomplicated: Secondary | ICD-10-CM

## 2020-05-07 DIAGNOSIS — E559 Vitamin D deficiency, unspecified: Secondary | ICD-10-CM

## 2020-05-07 DIAGNOSIS — R1084 Generalized abdominal pain: Secondary | ICD-10-CM

## 2020-05-07 DIAGNOSIS — R6889 Other general symptoms and signs: Secondary | ICD-10-CM | POA: Diagnosis not present

## 2020-05-07 DIAGNOSIS — Z79899 Other long term (current) drug therapy: Secondary | ICD-10-CM

## 2020-05-07 DIAGNOSIS — N529 Male erectile dysfunction, unspecified: Secondary | ICD-10-CM

## 2020-05-07 DIAGNOSIS — Q231 Congenital insufficiency of aortic valve: Secondary | ICD-10-CM

## 2020-05-07 DIAGNOSIS — J449 Chronic obstructive pulmonary disease, unspecified: Secondary | ICD-10-CM

## 2020-05-07 DIAGNOSIS — R7309 Other abnormal glucose: Secondary | ICD-10-CM

## 2020-05-07 DIAGNOSIS — M1990 Unspecified osteoarthritis, unspecified site: Secondary | ICD-10-CM

## 2020-05-07 MED ORDER — FINASTERIDE 5 MG PO TABS: 5.0000 mg | ORAL_TABLET | Freq: Every day | ORAL | 1 refills | Status: DC

## 2020-05-07 NOTE — Patient Instructions (Addendum)
Bring in your advanced directives and we can scan them in the system.   [employer] with no obligation # [phone] Do not have to be a member Tues-Sat 10-6  [place]- free test with no obligation # [phone] MUST BE A MEMBER Call for store hours   Ask insurance and pharmacy about shingrix - it is a 2 part shot that we will not be getting in the office.   Suggest getting AFTER covid vaccines, have to wait at least a month This shot can make you feel bad due to such good immune response it can trigger some inflammation so take tylenol or aleve day of or day after and plan on resting.   Can go to [email or web address] for more information  Shingrix Vaccination  Two vaccines are licensed and recommended to prevent shingles in the [place] Zoster vaccine live (ZVL, Zostavax) has been in use since 2006. Recombinant zoster vaccine (RZV, Shingrix), has been in use since 2017 and is recommended by ACIP as the preferred shingles vaccine.  What Everyone Should Know about Shingles Vaccine (Shingrix) One of the Recommended Vaccines by Disease Shingles vaccination is the only way to protect against shingles and postherpetic neuralgia (PHN), the most common complication from shingles. CDC recommends that healthy adults 50 years and older get two doses of the shingles vaccine called Shingrix (recombinant zoster vaccine), separated by 2 to 6 months, to prevent shingles and the complications from the disease. Your doctor or pharmacist can give you Shingrix as a shot in your upper arm. Shingrix provides strong protection against shingles and PHN. Two doses of Shingrix is more than 90% effective at preventing shingles and PHN. Protection stays above 85% for at least the first four years after you get vaccinated. Shingrix is the preferred vaccine, over Zostavax (zoster vaccine live), a shingles vaccine in use since 2006. Zostavax  may still be used to prevent shingles in healthy adults 60 years and older. For example, you could use Zostavax if a person is allergic to Shingrix, prefers Zostavax, or requests immediate vaccination and Shingrix is unavailable. Who Should Get Shingrix? Healthy adults 50 years and older should get two doses of Shingrix, separated by 2 to 6 months. You should get Shingrix even if in the past you . had shingles  . received Zostavax  . are not sure if you had chickenpox There is no maximum age for getting Shingrix. If you had shingles in the past, you can get Shingrix to help prevent future occurrences of the disease. There is no specific length of time that you need to wait after having shingles before you can receive Shingrix, but generally you should make sure the shingles rash has gone away before getting vaccinated. You can get Shingrix whether or not you remember having had chickenpox in the past. Studies show that more than 99% of Americans 40 years and older have had chickenpox, even if they don't remember having the disease. Chickenpox and shingles are related because they are caused by the same virus (varicella zoster virus). After a person recovers from chickenpox, the virus stays dormant (inactive) in the body. It can reactivate years later and cause shingles. If you had Zostavax in the recent past, you should wait at least eight weeks before getting Shingrix. Talk to your healthcare provider to determine the best time to get Shingrix. Shingrix is available in [employer] and pharmacies. To find doctor's offices or pharmacies near you that offer the vaccine, visit  HealthMap Vaccine FinderExternal. If you have questions about Shingrix, talk with your healthcare provider. Vaccine for Those 61 Years and Older  Shingrix reduces the risk of shingles and PHN by more than 90% in people 68 and older. CDC recommends the vaccine for healthy adults 23 and older.  Who Should Not Get Shingrix? You  should not get Shingrix if you: . have ever had a severe allergic reaction to any component of the vaccine or after a dose of Shingrix  . tested negative for immunity to varicella zoster virus. If you test negative, you should get chickenpox vaccine.  . currently have shingles  . currently are pregnant or breastfeeding. Women who are pregnant or breastfeeding should wait to get Shingrix.  Marland Kitchen receive specific antiviral drugs (acyclovir, famciclovir, or valacyclovir) 24 hours before vaccination (avoid use of these antiviral drugs for 14 days after vaccination)- zoster vaccine live only If you have a minor acute (starts suddenly) illness, such as a cold, you may get Shingrix. But if you have a moderate or severe acute illness, you should usually wait until you recover before getting the vaccine. This includes anyone with a temperature of 101.43F or higher. The side effects of the Shingrix are temporary, and usually last 2 to 3 days. While you may experience pain for a few days after getting Shingrix, the pain will be less severe than having shingles and the complications from the disease. How Well Does Shingrix Work? Two doses of Shingrix provides strong protection against shingles and postherpetic neuralgia (PHN), the most common complication of shingles. . In adults 36 to 65 years old who got two doses, Shingrix was 97% effective in preventing shingles; among adults 70 years and older, Shingrix was 91% effective.  . In adults 48 to 65 years old who got two doses, Shingrix was 91% effective in preventing PHN; among adults 70 years and older, Shingrix was 89% effective. Shingrix protection remained high (more than 85%) in people 70 years and older throughout the four years following vaccination. Since your risk of shingles and PHN increases as you get older, it is important to have strong protection against shingles in your older years. Top of Page  What Are the Possible Side Effects of Shingrix? Studies  show that Shingrix is safe. The vaccine helps your body create a strong defense against shingles. As a result, you are likely to have temporary side effects from getting the shots. The side effects may affect your ability to do normal daily activities for 2 to 3 days. Most people got a sore arm with mild or moderate pain after getting Shingrix, and some also had redness and swelling where they got the shot. Some people felt tired, had muscle pain, a headache, shivering, fever, stomach pain, or nausea. About 1 out of 6 people who got Shingrix experienced side effects that prevented them from doing regular activities. Symptoms went away on their own in about 2 to 3 days. Side effects were more common in younger people. You might have a reaction to the first or second dose of Shingrix, or both doses. If you experience side effects, you may choose to take over-the-counter pain medicine such as ibuprofen or acetaminophen. If you experience side effects from Shingrix, you should report them to the Vaccine Adverse Event Reporting System (VAERS). Your doctor might file this report, or you can do it yourself through the VAERS websiteExternal, or by calling [phone]. If you have any questions about side effects from Shingrix, talk with your doctor.  The shingles vaccine does not contain thimerosal (a preservative containing mercury). Top of Page  When Should I See a Doctor Because of the Side Effects I Experience From Shingrix? In clinical trials, Shingrix was not associated with serious adverse events. In fact, serious side effects from vaccines are extremely rare. For example, for every 1 million doses of a vaccine given, only one or two people may have a severe allergic reaction. Signs of an allergic reaction happen within minutes or hours after vaccination and include hives, swelling of the face and throat, difficulty breathing, a fast heartbeat, dizziness, or weakness. If you experience these or any other  life-threatening symptoms, see a doctor right away. Shingrix causes a strong response in your immune system, so it may produce short-term side effects more intense than you are used to from other vaccines. These side effects can be uncomfortable, but they are expected and usually go away on their own in 2 or 3 days. Top of Page  How Can I Pay For Shingrix? There are several ways shingles vaccine may be paid for: Medicare . Medicare Part D plans cover the shingles vaccine, but there may be a cost to you depending on your plan. There may be a copay for the vaccine, or you may need to pay in full then get reimbursed for a certain amount.  . Medicare Part B does not cover the shingles vaccine. Medicaid . Medicaid may or may not cover the vaccine. Contact your insurer to find out. Private health insurance . Many private health insurance plans will cover the vaccine, but there may be a cost to you depending on your plan. Contact your insurer to find out. Vaccine assistance programs . Some pharmaceutical companies provide vaccines to eligible adults who cannot afford them. You may want to check with the vaccine manufacturer, [employer], about Shingrix. If you do not currently have health insurance, learn more about affordable health coverage optionsExternal. To find doctor's offices or pharmacies near you that offer the vaccine, visit HealthMap Vaccine FinderExternal.

## 2020-05-08 LAB — LIPID PANEL
Cholesterol: 155 mg/dL (ref <200)
HDL: 30 mg/dL — ABNORMAL LOW (ref >=40)
LDL Cholesterol (Calc): 90 mg/dL (calc)
Non-HDL Cholesterol (Calc): 125 mg/dL (calc) (ref <130)
Total CHOL/HDL Ratio: 5.2 (calc) — ABNORMAL HIGH (ref <5.0)
Triglycerides: 293 mg/dL — ABNORMAL HIGH (ref <150)

## 2020-05-08 LAB — CBC WITH DIFFERENTIAL/PLATELET
Absolute Monocytes: 851 cells/uL (ref 200–950)
Basophils Absolute: 17 cells/uL (ref 0–200)
Basophils Relative: 0.2 %
Eosinophils Absolute: 163 cells/uL (ref 15–500)
Eosinophils Relative: 1.9 %
HCT: 42 % (ref 38.5–50.0)
Hemoglobin: 14.3 g/dL (ref 13.2–17.1)
Lymphs Abs: 1221 cells/uL (ref 850–3900)
MCH: 32.4 pg (ref 27.0–33.0)
MCHC: 34 g/dL (ref 32.0–36.0)
MCV: 95 fL (ref 80.0–100.0)
MPV: 11.4 fL (ref 7.5–12.5)
Monocytes Relative: 9.9 %
Neutro Abs: 6347 cells/uL (ref 1500–7800)
Neutrophils Relative %: 73.8 %
Platelets: 216 10*3/uL (ref 140–400)
RBC: 4.42 10*6/uL (ref 4.20–5.80)
RDW: 12.5 % (ref 11.0–15.0)
Total Lymphocyte: 14.2 %
WBC: 8.6 10*3/uL (ref 3.8–10.8)

## 2020-05-08 LAB — COMPLETE METABOLIC PANEL WITH GFR
AG Ratio: 2 (calc) (ref 1.0–2.5)
ALT: 17 U/L (ref 9–46)
AST: 15 U/L (ref 10–35)
Albumin: 4.3 g/dL (ref 3.6–5.1)
Alkaline phosphatase (APISO): 66 U/L (ref 35–144)
BUN: 13 mg/dL (ref 7–25)
CO2: 26 mmol/L (ref 20–32)
Calcium: 9.6 mg/dL (ref 8.6–10.3)
Chloride: 106 mmol/L (ref 98–110)
Creat: 1.14 mg/dL (ref 0.70–1.25)
GFR, Est African American: 78 mL/min/{1.73_m2} (ref >=60)
GFR, Est Non African American: 67 mL/min/{1.73_m2} (ref >=60)
Globulin: 2.1 g/dL (calc) (ref 1.9–3.7)
Glucose, Bld: 104 mg/dL — ABNORMAL HIGH (ref 65–99)
Potassium: 4.9 mmol/L (ref 3.5–5.3)
Sodium: 140 mmol/L (ref 135–146)
Total Bilirubin: 0.5 mg/dL (ref 0.2–1.2)
Total Protein: 6.4 g/dL (ref 6.1–8.1)

## 2020-05-08 LAB — TSH: TSH: 3.32 mIU/L (ref 0.40–4.50)

## 2020-05-08 LAB — HEMOGLOBIN A1C
Hgb A1c MFr Bld: 5.4 % of total Hgb (ref <5.7)
Mean Plasma Glucose: 108 (calc)
eAG (mmol/L): 6 (calc)

## 2020-05-08 LAB — VITAMIN D 25 HYDROXY (VIT D DEFICIENCY, FRACTURES): Vit D, 25-Hydroxy: 50 ng/mL (ref 30–100)

## 2020-05-08 LAB — MAGNESIUM: Magnesium: 2.1 mg/dL (ref 1.5–2.5)

## 2020-05-25 ENCOUNTER — Other Ambulatory Visit: Payer: Self-pay | Admitting: *Deleted

## 2020-05-25 ENCOUNTER — Other Ambulatory Visit: Payer: Self-pay | Admitting: Physician Assistant

## 2020-05-25 ENCOUNTER — Other Ambulatory Visit: Payer: Self-pay | Admitting: Internal Medicine

## 2020-05-25 MED ORDER — ATORVASTATIN CALCIUM 10 MG PO TABS: 10.0000 mg | ORAL_TABLET | Freq: Every day | ORAL | 1 refills | Status: DC

## 2020-05-25 MED ORDER — ATORVASTATIN CALCIUM 10 MG PO TABS
10.0000 mg | ORAL_TABLET | Freq: Every day | ORAL | 1 refills | Status: DC
Start: 2020-05-25 — End: 2020-05-25

## 2020-06-22 NOTE — Progress Notes (Signed)
Patient ID: Anthony Paul, male   DOB: Mar 05, 1956, 64 y.o.   MRN: [id number]     64 y.o. referred by Dr Melford Aase for murmur in 2014 Diagnosed with bicuspid AV Sees dentist twice/yearr. Previous smoker. 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  All other systems reviewed and negative  General: BP 140/84   Pulse 88   Ht 6\' 2"  (1.88 m)   Wt 225 lb (102.1 kg)   SpO2 98%   BMI 28.89 kg/m  Affect appropriate Healthy:  appears stated age 64: normal Neck supple with no adenopathy JVP normal no bruits no thyromegaly Lungs clear with no wheezing and good diaphragmatic motion Heart:  S1/S2  AS/AR  murmur, no rub, gallop or click PMI normal Abdomen: benighn, BS positve, no tenderness, no AAA no bruit.  No HSM or HJR Distal pulses intact with no bruits No edema Neuro non-focal Skin warm and dry No muscular weakness     Current Outpatient Medications  Medication Sig Dispense Refill  . acetaminophen (TYLENOL) 500 MG tablet Take 1,500 mg by mouth every 6 (six) hours as needed for mild pain.    Marland Kitchen aspirin 81 MG tablet Take 81 mg by mouth daily.     Marland Kitchen atorvastatin (LIPITOR) 10 MG tablet Take 1 tablet (10 mg  total) by mouth daily. 90 tablet 1  . Cholecalciferol (VITAMIN D) 2000 UNITS CAPS Take 1 capsule by mouth daily. Takes 500 mg    . Cyanocobalamin (VITAMIN B12 PO) Take by mouth daily.    . finasteride (PROSCAR) 5 MG tablet Take 1 tablet (5 mg total) by mouth daily. 90 tablet 1  . Flaxseed, Linseed, (FLAXSEED OIL) 1200 MG CAPS Take 1 capsule by mouth daily.    Marland Kitchen MOVANTIK 25 MG TABS tablet Take 25 mg by mouth daily.    . Omega-3 Fatty Acids (FISH OIL PO) Take 1 tablet by mouth daily.    . traZODone (DESYREL) 150 MG tablet TAKE 1/2 TO 1 TABLET 1 HOUR BEFORE BEDTIME IF NEEDED FOR SLEEP (Patient taking differently: Take 1/2 to 1 tablet 1 hour before Bedtime if needed for Sleep- takes every night) 90 tablet 3  . vitamin C (ASCORBIC ACID) 250 MG tablet Take 250 mg by mouth daily.     Current Facility-Administered Medications  Medication Dose Route Frequency Provider Last Rate Last Admin  . 0.9 %  sodium chloride infusion  500 mL Intravenous Once Yetta Flock, MD        Allergies  Morphine  Electrocardiogram: 06/23/20 SR rate 88 normal   Assessment and Plan  Bicuspid Aortic Valve:  10/22/18 stable mild AS mean gradient 18 mmHg mild to moderate AR will update echo. Aortic root 3.8 cm on CT 12/11/20  GI:  Seeing pain clinic CT with no pathology low risk for SMA disease  F/u GI  Elevated Triglycerides:  Continue fenobibrate  Labs with primary   CAD:  None noted on cardiac CT 12/11/18   Echo for AS F/U with me in a year   [employer]

## 2020-06-23 ENCOUNTER — Other Ambulatory Visit: Payer: Self-pay

## 2020-06-23 ENCOUNTER — Ambulatory Visit (INDEPENDENT_AMBULATORY_CARE_PROVIDER_SITE_OTHER): Payer: Medicare Other | Admitting: Cardiovascular Disease

## 2020-06-23 ENCOUNTER — Encounter: Payer: Self-pay | Admitting: Cardiovascular Disease

## 2020-06-23 VITALS — BP 140/84 | HR 88 | Ht 74.0 in | Wt 225.0 lb

## 2020-06-23 DIAGNOSIS — I350 Nonrheumatic aortic (valve) stenosis: Secondary | ICD-10-CM

## 2020-06-23 NOTE — Patient Instructions (Addendum)
Medication Instructions:  *If you need a refill on your cardiac medications before your next appointment, please call your pharmacy*  Lab Work: If you have labs (blood work) drawn today and your tests are completely normal, you will receive your results only by: . MyChart Message (if you have MyChart) OR . A paper copy in the mail If you have any lab test that is abnormal or we need to change your treatment, we will call you to review the results.  Testing/Procedures: Your physician has requested that you have an echocardiogram. Echocardiography is a painless test that uses sound waves to create images of your heart. It provides your doctor with information about the size and shape of your heart and how well your heart's chambers and valves are working. This procedure takes approximately one hour. There are no restrictions for this procedure.  Follow-Up: At [facility], you and your health needs are our priority.  As part of our continuing mission to provide you with exceptional heart care, we have created designated Provider Care Teams.  These Care Teams include your primary Cardiologist (physician) and Advanced Practice Providers (APPs -  Physician Assistants and Nurse Practitioners) who all work together to provide you with the care you need, when you need it.  We recommend signing up for the patient portal called "MyChart".  Sign up information is provided on this After Visit Summary.  MyChart is used to connect with patients for Virtual Visits (Telemedicine).  Patients are able to view lab/test results, encounter notes, upcoming appointments, etc.  Non-urgent messages can be sent to your provider as well.   To learn more about what you can do with MyChart, go to [email or web address].    Your next appointment:   12 month(s)  The format for your next appointment:   In Person  Provider:   You may see Dr. Nishan or one of the following Advanced Practice Providers on your designated  Care Team:    Lori Gerhardt, NP  Laura Ingold, NP  Jill McDaniel, NP   

## 2020-07-14 ENCOUNTER — Other Ambulatory Visit: Payer: Self-pay

## 2020-07-14 ENCOUNTER — Ambulatory Visit (HOSPITAL_COMMUNITY): Payer: Medicare Other | Attending: Cardiology

## 2020-07-14 DIAGNOSIS — I350 Nonrheumatic aortic (valve) stenosis: Secondary | ICD-10-CM | POA: Insufficient documentation

## 2020-07-14 DIAGNOSIS — I351 Nonrheumatic aortic (valve) insufficiency: Secondary | ICD-10-CM | POA: Diagnosis present

## 2020-07-14 LAB — ECHOCARDIOGRAM COMPLETE
AR max vel: 1.49 cm2
AV Area VTI: 1.46 cm2
AV Area mean vel: 1.47 cm2
AV Mean grad: 20 mmHg
AV Peak grad: 32 mmHg
Ao pk vel: 2.83 m/s
Area-P 1/2: 3.34 cm2
P 1/2 time: 386 msec
S' Lateral: 3.5 cm

## 2020-07-15 ENCOUNTER — Telehealth: Payer: Self-pay | Admitting: Gastroenterology

## 2020-07-15 NOTE — Telephone Encounter (Signed)
Faxed order to [facility], requesting scheduling. No pre cert necessary

## 2020-07-16 NOTE — Telephone Encounter (Signed)
Left message for patient to call back  

## 2020-07-16 NOTE — Telephone Encounter (Signed)
Patient reports that he has rectal pain during and after a BM. Reports comes in cycles then goes away for months.  He does not report any rectal bleeding. On chronic narcotics. He reports that he had stopped the Movantik because he was doing well. He reports stools are hard.  He was just started on morphine this week.  He will resume his MOvantik and call back next week if he is not improved.

## 2020-08-10 ENCOUNTER — Encounter: Payer: Medicare Other | Admitting: Physician Assistant

## 2020-08-12 ENCOUNTER — Ambulatory Visit (INDEPENDENT_AMBULATORY_CARE_PROVIDER_SITE_OTHER): Payer: Medicare Other | Admitting: Physician Assistant

## 2020-08-12 ENCOUNTER — Other Ambulatory Visit: Payer: Self-pay

## 2020-08-12 ENCOUNTER — Encounter: Payer: Self-pay | Admitting: Physician Assistant

## 2020-08-12 VITALS — BP 130/74 | HR 73 | Temp 97.3°F | Ht 74.0 in | Wt 225.0 lb

## 2020-08-12 DIAGNOSIS — R109 Unspecified abdominal pain: Secondary | ICD-10-CM

## 2020-08-12 DIAGNOSIS — Z0001 Encounter for general adult medical examination with abnormal findings: Secondary | ICD-10-CM

## 2020-08-12 DIAGNOSIS — R7309 Other abnormal glucose: Secondary | ICD-10-CM

## 2020-08-12 DIAGNOSIS — J449 Chronic obstructive pulmonary disease, unspecified: Secondary | ICD-10-CM | POA: Diagnosis not present

## 2020-08-12 DIAGNOSIS — Z125 Encounter for screening for malignant neoplasm of prostate: Secondary | ICD-10-CM

## 2020-08-12 DIAGNOSIS — Z79899 Other long term (current) drug therapy: Secondary | ICD-10-CM

## 2020-08-12 DIAGNOSIS — E782 Mixed hyperlipidemia: Secondary | ICD-10-CM

## 2020-08-12 DIAGNOSIS — Q231 Congenital insufficiency of aortic valve: Secondary | ICD-10-CM

## 2020-08-12 DIAGNOSIS — F17210 Nicotine dependence, cigarettes, uncomplicated: Secondary | ICD-10-CM

## 2020-08-12 DIAGNOSIS — R030 Elevated blood-pressure reading, without diagnosis of hypertension: Secondary | ICD-10-CM | POA: Diagnosis not present

## 2020-08-12 DIAGNOSIS — E559 Vitamin D deficiency, unspecified: Secondary | ICD-10-CM

## 2020-08-12 DIAGNOSIS — Z87891 Personal history of nicotine dependence: Secondary | ICD-10-CM

## 2020-08-12 NOTE — Patient Instructions (Addendum)
SMOKING CESSATION WITH CHANTIX  [employer]  [phone] for more information or for a free program for smoking cessation help.   You can call QUIT SMART 1-800-QUIT-NOW for free nicotine patches or replacement therapy- if they are out- keep calling  [facility] Can call for smoking cessation classes, [phone]  If you have a smart phone, please look up Smoke Free app, this will help you stay on track and give you information about money you have saved, life that you have gained back and a ton of more information.   We are giving you chantix for smoking cessation. You can do it! And we are here to help! You may have heard some scary side effects about chantix, the three most common I hear about are nausea, crazy dreams and depression.  However, I like for my patients to try to stay on 1/2 a tablet twice a day rather than one tablet twice a day as normally prescribed. This helps decrease the chances of side effects and helps save money by making a one month prescription last two months  Please start the prescription this way:  Start 1/2 tablet by mouth once daily after food with a full glass of water for 3 days Then do 1/2 tablet by mouth twice daily for 4 days. During this first week you can smoke, but try to stop after this week.  At this point we have several options: 1) continue on 1/2 tablet twice a day- which I encourage you to do. You can stay on this dose the rest of the time on the medication or if you still feel the need to smoke you can do one of the two options below. 2) do one tablet in the morning and 1/2 in the evening which helps decrease dreams. 3) do one tablet twice a day.   What if I miss a dose? If you miss a dose, take it as soon as you can. If it is almost time for your next dose, take only that dose. Do not take double or extra doses.  What should I watch for while using this medicine? Visit your doctor or health care professional for  regular check ups. Ask for ongoing advice and encouragement from your doctor or healthcare professional, friends, and family to help you quit. If you smoke while on this medication, quit again  Your mouth may get dry. Chewing sugarless gum or hard candy, and drinking plenty of water may help. Contact your doctor if the problem does not go away or is severe.  You may get drowsy or dizzy. Do not drive, use machinery, or do anything that needs mental alertness until you know how this medicine affects you. Do not stand or sit up quickly, especially if you are an older patient.   The use of this medicine may increase the chance of suicidal thoughts or actions. Pay special attention to how you are responding while on this medicine. Any worsening of mood, or thoughts of suicide or dying should be reported to your health care professional right away.  ADVANTAGES OF QUITTING SMOKING  Within 20 minutes, blood pressure decreases. Your pulse is at normal level.  After 8 hours, carbon monoxide levels in the blood return to normal. Your oxygen level increases.  After 24 hours, the chance of having a heart attack starts to decrease. Your breath, hair, and body stop smelling like smoke.  After 48 hours, damaged nerve endings begin to recover. Your sense of taste and   smell improve.  After 72 hours, the body is virtually free of nicotine. Your bronchial tubes relax and breathing becomes easier.  After 2 to 12 weeks, lungs can hold more air. Exercise becomes easier and circulation improves.  After 1 year, the risk of coronary heart disease is cut in half.  After 5 years, the risk of stroke falls to the same as a nonsmoker.  After 10 years, the risk of lung cancer is cut in half and the risk of other cancers decreases significantly.  After 15 years, the risk of coronary heart disease drops, usually to the level of a nonsmoker.  You will have extra money to spend on things other than cigarettes.  

## 2020-08-12 NOTE — Progress Notes (Signed)
CPE Assessment and Plan:  Bicuspid aortic valve Has yearly following, no symptoms at this time  Borderline systolic HTN - continue medications, DASH diet, exercise and monitor at home. Call if greater than 130/80.  -     CBC with Differential/Platelet -     COMPLETE METABOLIC PANEL WITH GFR -     TSH  COPD GOLD 0  Advised to stop smoking Continue follow up pulmonary  Mixed hyperlipidemia -continue CRESTOR - check lipids, decrease fatty foods, increase activity.  -     Lipid panel  Abnormal glucose Discussed disease progression and risks Discussed diet/exercise, weight management and risk modification  Medication management -     Magnesium  Vitamin D deficiency -     VITAMIN D 25 Hydroxy (Vit-D Deficiency, Fractures)  Cigarette smoker Smoking cessation-  instruction/counseling given, counseled patient on the dangers of tobacco use, advised patient to stop smoking, and reviewed strategies to maximize success, patient not ready to quit at this time.  DISCUSSED LOW DOSE CT, GREATER THAN 40 PACK YEAR SMOKING HISTORY, will place order -lung cancer screening with low dose CT discussed as recommended by guidelines based on age, number of pack year history.  Discussed risks of screening including but not limited to false positives on xray, further testing or consultation with specialist, and possible false negative CT as well. Understanding expressed and wishes to proceed with CT testing. Order placed.   Osteoarthritis, unspecified osteoarthritis type, unspecified site Bilateral rib pain Continue pain management- ? Want referral to tertiary center Has failed cymbalta, lyrica, gabaptentin Failed trigger point, failed spinal stimulator Massage and pressure belt helps  Medicare visit 1 year  BMI 30.0-30.9,adult  Overweight  - long discussion about weight loss, diet, and exercise -recommended diet heavy in fruits and veggies and low in animal meats, cheeses, and dairy  products  Erectile dysfunction, unspecified erectile dysfunction type -     Testosterone  Discussed med's effects and SE's. Screening labs and tests as requested with regular follow-up as recommended. Over 40 minutes of exam, counseling, chart review and critical decision making was performed Future Appointments  Date Time Provider [place]  05/17/2021 10:00 AM [name], PA-C [facility] None  08/18/2021  2:00 PM [name], PA-C [facility] None   HPI Patient presents for a CPE and follow up for HTN, chol.   He sees pain management for bilateral rib pain, tooth ache, some flank pain, no radiation to his back, states pain is getting worse, has tried lyrica/gabpentin without help,no help with Cymbalta.  Had tried trigger points, failed the trial of the spinal stimulator, has done inhalers for possible pleuritis that did not help. Had recent normal AB [place], and GI visit. Pain is better in the AM and worse in the evening.  No worsening pain with exercise or excretion, just helped daughter build deck and it did not make the pain worse.   He continues to smoke, started age 84, smoked a pack a day from 10 yr to 89 years old 40+ pack year history, but states he has decreased 10 cigs, is not on an inhaler. Has not symptoms at this time.   His blood pressure has been controlled at home, today their BP is BP: 130/74   He has a bicuspid aortic valve, follows with cardiology, has appointment in July.  Echo 06/2020- EF normal mild AS and mild to moderate AR stable gradients- mild aortic root dilitation and stress test normal 09/2016. He does workout. He denies chest pain, shortness  of breath, dizziness.   BMI is Body mass index is 28.89 kg/m., he is working on diet and exercise. Wt Readings from Last 3 Encounters:  08/12/20 225 lb (102.1 kg)  06/23/20 225 lb (102.1 kg)  05/07/20 239 lb (108.4 kg)   He is on cholesterol medication, atorvastatin 10 and denies myalgias. His  cholesterol is not at goal less than 70. He is a smoker.  The cholesterol last visit was:   Lab Results  Component Value Date   CHOL 155 05/07/2020   HDL 30 (L) 05/07/2020   LDLCALC 90 05/07/2020   TRIG 293 (H) 05/07/2020   CHOLHDL 5.2 (H) 05/07/2020    Last A1C in the office was:  Lab Results  Component Value Date   HGBA1C 5.4 05/07/2020   Last GFR: Lab Results  Component Value Date   GFRNONAA 67 05/07/2020    Patient is on Vitamin D supplement.   Lab Results  Component Value Date   VD25OH 50 05/07/2020      Current Medications:     Current Outpatient Medications (Cardiovascular):  .  atorvastatin (LIPITOR) 10 MG tablet, Take 1 tablet (10 mg total) by mouth daily.     Current Outpatient Medications (Analgesics):  .  acetaminophen (TYLENOL) 500 MG tablet, Take 1,500 mg by mouth every 6 (six) hours as needed for mild pain. Marland Kitchen  aspirin 81 MG tablet, Take 81 mg by mouth daily.    Current Outpatient Medications (Hematological):  Marland Kitchen  Cyanocobalamin (VITAMIN B12 PO), Take by mouth daily.   Current Outpatient Medications (Other):  Marland Kitchen  Cholecalciferol (VITAMIN D) 2000 UNITS CAPS, Take 1 capsule by mouth daily. Takes 500 mg .  finasteride (PROSCAR) 5 MG tablet, Take 1 tablet (5 mg total) by mouth daily. .  Flaxseed, Linseed, (FLAXSEED OIL) 1200 MG CAPS, Take 1 capsule by mouth daily. Marland Kitchen  MOVANTIK 25 MG TABS tablet, Take 25 mg by mouth daily. .  Omega-3 Fatty Acids (FISH OIL PO), Take 1 tablet by mouth daily. .  traZODone (DESYREL) 150 MG tablet, TAKE 1/2 TO 1 TABLET 1 HOUR BEFORE BEDTIME IF NEEDED FOR SLEEP (Patient taking differently: Take 1/2 to 1 tablet 1 hour before Bedtime if needed for Sleep- takes every night) .  vitamin C (ASCORBIC ACID) 250 MG tablet, Take 250 mg by mouth daily.  Current Facility-Administered Medications (Other):  .  0.9 %  sodium chloride infusion  Allergies:  Allergies  Allergen Reactions  . Morphine Itching    Can take hydrocodone without  issues   Health Maintenance:  Health Maintenance  Topic Date Due  . INFLUENZA VACCINE  07/19/2020  . COLONOSCOPY  01/13/2025  . TETANUS/TDAP  03/08/2025  . COVID-19 Vaccine  Completed  . Hepatitis C Screening  Completed  . HIV Screening  Completed  . PNA vac Low Risk Adult  Completed   Immunization History  Administered Date(s) Administered  . Influenza Inj Mdck Quad With Preservative 10/24/2017, 10/31/2018  . Influenza Split 10/05/2015  . Influenza,Quad,Nasal, Live 10/05/2015  . Influenza,inj,Quad PF,6+ Mos 11/15/2013, 10/16/2019  . Influenza,inj,quad, With Preservative 09/26/2016  . Influenza-Unspecified 12/19/2009  . PFIZER SARS-COV-2 Vaccination 02/10/2020, 03/02/2020  . PPD Test 03/09/2015, 03/14/2016  . Pneumococcal Conjugate-13 11/15/2013  . Pneumococcal Polysaccharide-23 05/07/2020  . Pneumococcal-Unspecified 12/20/1995  . Td 12/19/2004  . Tdap 03/09/2015    DEXA: N/A Colonoscopy:  12/2019, Dr. Havery Moros, on bASA 5 year follow up EGD: N/A PFTs  04/2018 CT chest 03/2018 at [facility]- plaque on aortic arch CXR 2015 Echo  10/2017 Stress test 09/2016  Eye Exam: None Dentist: Dr. Jacquenette Shone  Patient Care Team: Unk Pinto, MD as PCP - General (Internal Medicine) Josue Hector, MD as PCP - Cardiology (Cardiology)  Medical History:  has COPD GOLD 0 ; Bicuspid aortic valve; Mixed hyperlipidemia; Borderline systolic HTN; ED (erectile dysfunction); DJD (degenerative joint disease); Abnormal glucose; Vitamin D deficiency; Medication management; Chest pain, atypical most c/w ibs ; Cigarette smoker; Chronic left shoulder pain; Abdominal pain; Trigger point of abdomen; and Congenital insufficiency of aortic valve on their problem list. Surgical History:  He  has a past surgical history that includes Colon surgery (Jan 2012); Tonsillectomy; Hemorroidectomy; Colonoscopy (01/13/2014); Upper gi endoscopy (2015); Inguinal hernia repair (Right, 2015); Resection distal clavical  (Right); and Upper gastrointestinal endoscopy. Family History:  His family history includes Benign prostatic hyperplasia in his father; Cancer in his sister; Diabetes in his mother; Heart disease in his father; Kidney disease in his sister. Social History:   reports that he has been smoking cigarettes. He started smoking about 48 years ago. He has a 70.50 pack-year smoking history. He quit smokeless tobacco use about 13 years ago.  His smokeless tobacco use included chew. He reports current alcohol use of about 5.0 standard drinks of alcohol per week. He reports that he does not use drugs.   Review of Systems:  Review of Systems  Constitutional: Negative.   HENT: Negative.   Eyes: Negative.   Respiratory: Negative.   Cardiovascular: Negative.   Gastrointestinal: Negative.   Genitourinary: Negative.   Musculoskeletal: Positive for myalgias.  Skin: Negative.     Physical Exam: Estimated body mass index is 28.89 kg/m as calculated from the following:   Height as of this encounter: 6\' 2"  (1.88 m).   Weight as of this encounter: 225 lb (102.1 kg). BP 130/74   Pulse 73   Temp (!) 97.3 F (36.3 C)   Ht 6\' 2"  (1.88 m)   Wt 225 lb (102.1 kg)   SpO2 98%   BMI 28.89 kg/m  General Appearance: Well nourished, in no apparent distress.  Eyes: PERRLA, EOMs, conjunctiva no swelling or erythema, normal fundi and vessels.  Sinuses: No Frontal/maxillary tenderness  ENT/Mouth: Ext aud canals clear, normal light reflex with TMs without erythema, bulging. Good dentition. No erythema, swelling, or exudate on post pharynx. Tonsils not swollen or erythematous. Hearing normal.  Neck: Supple, thyroid normal. No bruits  Respiratory: Respiratory effort normal, BS equal bilaterally without rales, rhonchi, wheezing or stridor.  Cardio: RRR without murmurs, rubs or gallops. Brisk peripheral pulses without edema.  Chest: symmetric, with normal excursions and percussion.  Abdomen: Soft, general tenderness, no  guarding, rebound, hernias, masses, or organomegaly.  Lymphatics: Non tender without lymphadenopathy.  Genitourinary: defer Musculoskeletal: Full ROM all peripheral extremities,5/5 strength, and normal gait.  Skin: Warm, dry without rashes, lesions, ecchymosis. Neuro: Cranial nerves intact, reflexes equal bilaterally. Normal muscle tone, no cerebellar symptoms. Sensation intact.  Psych: Awake and oriented X 3, normal affect, Insight and Judgment appropriate.   EKG: defer cardio  [name] 2:12 PM [facility]

## 2020-08-13 LAB — COMPLETE METABOLIC PANEL WITH GFR
AG Ratio: 2.1 (calc) (ref 1.0–2.5)
ALT: 32 U/L (ref 9–46)
AST: 20 U/L (ref 10–35)
Albumin: 4.5 g/dL (ref 3.6–5.1)
Alkaline phosphatase (APISO): 61 U/L (ref 35–144)
BUN: 14 mg/dL (ref 7–25)
CO2: 24 mmol/L (ref 20–32)
Calcium: 9.7 mg/dL (ref 8.6–10.3)
Chloride: 105 mmol/L (ref 98–110)
Creat: 1.16 mg/dL (ref 0.70–1.25)
GFR, Est African American: 76 mL/min/{1.73_m2} (ref >=60)
GFR, Est Non African American: 66 mL/min/{1.73_m2} (ref >=60)
Globulin: 2.1 g/dL (calc) (ref 1.9–3.7)
Glucose, Bld: 109 mg/dL — ABNORMAL HIGH (ref 65–99)
Potassium: 4.4 mmol/L (ref 3.5–5.3)
Sodium: 136 mmol/L (ref 135–146)
Total Bilirubin: 0.4 mg/dL (ref 0.2–1.2)
Total Protein: 6.6 g/dL (ref 6.1–8.1)

## 2020-08-13 LAB — CBC WITH DIFFERENTIAL/PLATELET
Absolute Monocytes: 587 cells/uL (ref 200–950)
Basophils Absolute: 21 cells/uL (ref 0–200)
Basophils Relative: 0.3 %
Eosinophils Absolute: 104 cells/uL (ref 15–500)
Eosinophils Relative: 1.5 %
HCT: 45.4 % (ref 38.5–50.0)
Hemoglobin: 15.4 g/dL (ref 13.2–17.1)
Lymphs Abs: 1380 cells/uL (ref 850–3900)
MCH: 32 pg (ref 27.0–33.0)
MCHC: 33.9 g/dL (ref 32.0–36.0)
MCV: 94.2 fL (ref 80.0–100.0)
MPV: 11.7 fL (ref 7.5–12.5)
Monocytes Relative: 8.5 %
Neutro Abs: 4809 cells/uL (ref 1500–7800)
Neutrophils Relative %: 69.7 %
Platelets: 203 10*3/uL (ref 140–400)
RBC: 4.82 10*6/uL (ref 4.20–5.80)
RDW: 12.6 % (ref 11.0–15.0)
Total Lymphocyte: 20 %
WBC: 6.9 10*3/uL (ref 3.8–10.8)

## 2020-08-13 LAB — LIPID PANEL
Cholesterol: 167 mg/dL (ref <200)
HDL: 32 mg/dL — ABNORMAL LOW (ref >=40)
LDL Cholesterol (Calc): 98 mg/dL (calc)
Non-HDL Cholesterol (Calc): 135 mg/dL (calc) — ABNORMAL HIGH (ref <130)
Total CHOL/HDL Ratio: 5.2 (calc) — ABNORMAL HIGH (ref <5.0)
Triglycerides: 257 mg/dL — ABNORMAL HIGH (ref <150)

## 2020-08-13 LAB — TSH: TSH: 2.04 mIU/L (ref 0.40–4.50)

## 2020-08-13 LAB — MICROALBUMIN / CREATININE URINE RATIO
Creatinine, Urine: 535 mg/dL — ABNORMAL HIGH (ref 20–320)
Microalb Creat Ratio: 2 mcg/mg creat (ref <30)
Microalb, Ur: 1 mg/dL

## 2020-08-13 LAB — PSA: PSA: 0.2 ng/mL (ref <=4.0)

## 2020-10-12 ENCOUNTER — Other Ambulatory Visit: Payer: Self-pay

## 2020-10-12 ENCOUNTER — Ambulatory Visit
Admission: RE | Admit: 2020-10-12 | Discharge: 2020-10-12 | Disposition: A | Discharge status: Home / Self Care | Payer: Medicare Other | Source: Ambulatory Visit | Attending: Physician Assistant | Admitting: Physician Assistant

## 2020-10-12 DIAGNOSIS — Z87891 Personal history of nicotine dependence: Secondary | ICD-10-CM

## 2020-10-12 DIAGNOSIS — F17210 Nicotine dependence, cigarettes, uncomplicated: Secondary | ICD-10-CM

## 2020-10-15 ENCOUNTER — Encounter: Payer: Self-pay | Admitting: Adult Health Nurse Practitioner

## 2020-10-15 DIAGNOSIS — I700 Atherosclerosis of aorta: Secondary | ICD-10-CM | POA: Insufficient documentation

## 2020-11-25 ENCOUNTER — Other Ambulatory Visit: Payer: Self-pay

## 2020-11-25 ENCOUNTER — Encounter: Payer: Self-pay | Admitting: Internal Medicine

## 2020-11-25 ENCOUNTER — Ambulatory Visit (INDEPENDENT_AMBULATORY_CARE_PROVIDER_SITE_OTHER): Payer: Medicare Other | Admitting: Internal Medicine

## 2020-11-25 VITALS — BP 120/66 | HR 75 | Temp 97.0°F | Resp 16 | Ht 74.0 in | Wt 232.2 lb

## 2020-11-25 DIAGNOSIS — R0989 Other specified symptoms and signs involving the circulatory and respiratory systems: Secondary | ICD-10-CM | POA: Diagnosis not present

## 2020-11-25 DIAGNOSIS — G894 Chronic pain syndrome: Secondary | ICD-10-CM

## 2020-11-25 DIAGNOSIS — R7309 Other abnormal glucose: Secondary | ICD-10-CM

## 2020-11-25 DIAGNOSIS — Z23 Encounter for immunization: Secondary | ICD-10-CM

## 2020-11-25 DIAGNOSIS — E782 Mixed hyperlipidemia: Secondary | ICD-10-CM | POA: Diagnosis not present

## 2020-11-25 DIAGNOSIS — Z79899 Other long term (current) drug therapy: Secondary | ICD-10-CM

## 2020-11-25 DIAGNOSIS — E559 Vitamin D deficiency, unspecified: Secondary | ICD-10-CM

## 2020-11-25 NOTE — Patient Instructions (Signed)

## 2020-11-25 NOTE — Progress Notes (Signed)
History of Present Illness:       This very nice 64 y.o.  MWM presents for 3 month follow up with HTN, HLD, Pre-Diabetes and Vitamin D Deficiency.       Patient has Chronic Abdominal Pain with negative GI Work-ups predating from  2009 and felt of musculoskeletal origin and patient has been followed in Pain Management. Pain is bilateral lower abdominal (R>L) exacerbated with positional changes. Patient has failed control on multiple meds including Opioids, Cymbalta, Gabapentin Recardo Evangelist & no avail with trigger point injections or spinal cord stimulator.       Patient is monitored expectantly for mild labile HTN. Today's BP is at goal - 120/66. Patient does have hx/o Bicuspid AoV with mild AS/AR followed annually by [name] with serial Echocardiograms.   Patient has had no complaints of any cardiac type chest pain, palpitations, dyspnea / orthopnea / PND, dizziness, claudication, or dependent edema.  BP Readings from Last 3 Encounters:  08/12/20 130/74  06/23/20 140/84  05/07/20 116/68        Hyperlipidemia is near controlled with diet & meds. Patient denies myalgias or other med SE's. Last Lipids were at goal except elevated Trig's:  Lab Results  Component Value Date   CHOL 167 08/12/2020   HDL 32 (L) 08/12/2020   LDLCALC 98 08/12/2020   TRIG 257 (H) 08/12/2020   CHOLHDL 5.2 (H) 08/12/2020    Also, the patient has history of PreDiabetes (A1c 5.7%  /2013) and has had no symptoms of reactive hypoglycemia, diabetic polys, paresthesias or visual blurring.  Last A1c was normal & at goal:  Lab Results  Component Value Date   HGBA1C 5.4 05/07/2020       Further, the patient also has history of Vitamin D Deficiency ("24" /2013) and supplements vitamin D without any suspected side-effects. Last vitamin D was still low (goal  70-100):  Lab Results  Component Value Date   VD25OH 50 05/07/2020    Current Outpatient Medications on File Prior to Visit  Medication Sig  .  acetaminophen (TYLENOL) 500 MG tablet Take 1,500 mg by mouth every 6 (six) hours as needed for mild pain.  Marland Kitchen aspirin 81 MG tablet Takedaily.   . Atorvastatin 10 MG tablet Take 1 tablet (aily.  Marland Kitchen VITAMIN D 2000 U Take 1 capsule daily.   Marland Kitchen VITAMIN B12 tab Take  daily.  . finasteride (5 MG tablet Take 1 tablet  daily.  Marland Kitchen FLAXSEED OIL 1200 MG CAPS Take 1 capsule daily.  Marland Kitchen MOVANTIK 25 MG TABS tablet Take  daily.  Marland Kitchen NARCAN 4 MG/0.1ML nasal spray kit 1 Spray Both Nares PRN  . Omega-3 FISH OIL Take 1 tablet  daily.  Marland Kitchen PERCOCET 10-325 MG tablet Take 1 tablet  as needed.  . traZODone 150 MG tablet TAKE 1/2 TO 1 TABLET 1 HOUR BEFORE BEDTIME IF NEEDED FOR SLEEP   . vitamin C  250 MG tablet Take  daily.    Allergies  Allergen Reactions  . Morphine Itching    Can take hydrocodone without issues    PMHx:   Past Medical History:  Diagnosis Date  . Abnormal CT scan, chest    Blebs on CT chest but NORMAL PFTs- no COPD  . Aortic stenosis   . Bicuspid aortic valve    Echo 2014  EF 55-60% Mild to moderate AR mild AS with mean gradient 18 mmHg and peak of 25 mmHg. ? Bicuspid valve with dilated aortic root  4.0 cm.  . diverticulosis   . DJD (degenerative joint disease)   . ED (erectile dysfunction)   . Elevated blood pressure reading without diagnosis of hypertension   . Heart murmur   . Hx of colonic polyp 2015  . Hypercholesteremia     Immunization History  Administered Date(s) Administered  . Influenza Inj Mdck Quad With Preservative 10/24/2017, 10/31/2018  . Influenza Split 10/05/2015  . Influenza,Quad,Nasal, Live 10/05/2015  . Influenza,inj,Quad PF,6+ Mos 11/15/2013, 10/16/2019  . Influenza,inj,quad, With Preservative 09/26/2016  . Influenza-Unspecified 12/19/2009  . PFIZER SARS-COV-2 Vaccination 02/10/2020, 03/02/2020  . PPD Test 03/09/2015, 03/14/2016  . Pneumococcal Conjugate-13 11/15/2013  . Pneumococcal Polysaccharide-23 05/07/2020  . Pneumococcal-Unspecified 12/20/1995  . Td  12/19/2004  . Tdap 03/09/2015    Past Surgical History:  Procedure Laterality Date  . COLON SURGERY  Jan 2012  . COLONOSCOPY  01/13/2014   Dr. Earlean Shawl, due every 5-7 years  . HEMORROIDECTOMY    . INGUINAL HERNIA REPAIR Right 2015   Dr. Greer Pickerel  . RESECTION DISTAL CLAVICAL Right   . TONSILLECTOMY    . UPPER GASTROINTESTINAL ENDOSCOPY    . UPPER GI ENDOSCOPY  2015   Dr. Earlean Shawl    FHx:    Reviewed / unchanged  SHx:    Reviewed / unchanged   Systems Review:  Constitutional: Denies fever, chills, wt changes, headaches, insomnia, fatigue, night sweats, change in appetite. Eyes: Denies redness, blurred vision, diplopia, discharge, itchy, watery eyes.  ENT: Denies discharge, congestion, post nasal drip, epistaxis, sore throat, earache, hearing loss, dental pain, tinnitus, vertigo, sinus pain, snoring.  CV: Denies chest pain, palpitations, irregular heartbeat, syncope, dyspnea, diaphoresis, orthopnea, PND, claudication or edema. Respiratory: denies cough, dyspnea, DOE, pleurisy, hoarseness, laryngitis, wheezing.  Gastrointestinal: Denies dysphagia, odynophagia, heartburn, reflux, water brash, abdominal pain or cramps, nausea, vomiting, bloating, diarrhea, constipation, hematemesis, melena, hematochezia  or hemorrhoids. Genitourinary: Denies dysuria, frequency, urgency, nocturia, hesitancy, discharge, hematuria or flank pain. Musculoskeletal: Denies arthralgias, myalgias, stiffness, jt. swelling, pain, limping or strain/sprain.  Skin: Denies pruritus, rash, hives, warts, acne, eczema or change in skin lesion(s). Neuro: No weakness, tremor, incoordination, spasms, paresthesia or pain. Psychiatric: Denies confusion, memory loss or sensory loss. Endo: Denies change in weight, skin or hair change.  Heme/Lymph: No excessive bleeding, bruising or enlarged lymph nodes.  Physical Exam  BP 120/66   Pulse 75   Temp (!) 97 F (36.1 C)   Resp 16   Ht 6' 2"  (1.88 m)   Wt 232 lb 3.2 oz  (105.3 kg)   SpO2 97%   BMI 29.81 kg/m   Appears  well nourished, well groomed  and in no distress.  Eyes: PERRLA, EOMs, conjunctiva no swelling or erythema. Sinuses: No frontal/maxillary tenderness ENT/Mouth: EAC's clear, TM's nl w/o erythema, bulging. Nares clear w/o erythema, swelling, exudates. Oropharynx clear without erythema or exudates. Oral hygiene is good. Tongue normal, non obstructing. Hearing intact.  Neck: Supple. Thyroid not palpable. Car 2+/2+ without bruits, nodes or JVD. Chest: Respirations nl with BS clear & equal w/o rales, rhonchi, wheezing or stridor.  Cor: Heart sounds normal w/ regular rate and rhythm witho soft Aos/R  Murmurs. Peripheral pulses normal and equal  without edema.  Abdomen: Soft & bowel sounds normal. Non-tender w/o guarding, rebound, hernias, masses or organomegaly.  Lymphatics: Unremarkable.  Musculoskeletal: Full ROM all peripheral extremities, joint stability, 5/5 strength and normal gait.  Skin: Warm, dry without exposed rashes, lesions or ecchymosis apparent.  Neuro: Cranial nerves intact, reflexes equal bilaterally.  Sensory-motor testing grossly intact. Tendon reflexes grossly intact.  Pysch: Alert & oriented x 3.  Insight and judgement nl & appropriate. No ideations.  Assessment and Plan:  1. Labile hypertension  - Continue medication, monitor blood pressure at home.  - Continue DASH diet.  Reminder to go to the ER if any CP,  SOB, nausea, dizziness, severe HA, changes vision/speech.  - CBC with Differential/Platelet - COMPLETE METABOLIC PANEL WITH GFR - Magnesium - TSH  2. Hyperlipidemia, mixed  - Continue diet/meds, exercise,& lifestyle modifications.  - Continue monitor periodic cholesterol/liver & renal functions   - Lipid panel - TSH  3. Abnormal glucose  - Continue diet, exercise  - Lifestyle modifications.  - Monitor appropriate labs.  - Hemoglobin A1c - Insulin, random  4. Vitamin D deficiency  - Continue  supplementation.  - VITAMIN D 25 Hydroxy  5. Chronic pain syndrome   6. Medication management  - CBC with Differential/Platelet - COMPLETE METABOLIC PANEL WITH GFR - Magnesium - Lipid panel - TSH - Hemoglobin A1c - Insulin, random - VITAMIN D 25 Hydroxy         Discussed  regular exercise, BP monitoring, weight control to achieve/maintain BMI less than 25 and discussed med and SE's. Recommended labs to assess and monitor clinical status with further disposition pending results of labs.  I discussed the assessment and treatment plan with the patient. The patient was provided an opportunity to ask questions and all were answered. The patient agreed with the plan and demonstrated an understanding of the instructions.  I provided over 30 minutes of exam, counseling, chart review and  complex critical decision making.   Kirtland Bouchard, MD

## 2020-11-26 LAB — CBC WITH DIFFERENTIAL/PLATELET
Absolute Monocytes: 524 cells/uL (ref 200–950)
Basophils Absolute: 21 cells/uL (ref 0–200)
Basophils Relative: 0.3 %
Eosinophils Absolute: 62 cells/uL (ref 15–500)
Eosinophils Relative: 0.9 %
HCT: 45.1 % (ref 38.5–50.0)
Hemoglobin: 15.4 g/dL (ref 13.2–17.1)
Lymphs Abs: 1214 cells/uL (ref 850–3900)
MCH: 31.8 pg (ref 27.0–33.0)
MCHC: 34.1 g/dL (ref 32.0–36.0)
MCV: 93.2 fL (ref 80.0–100.0)
MPV: 11.8 fL (ref 7.5–12.5)
Monocytes Relative: 7.6 %
Neutro Abs: 5078 cells/uL (ref 1500–7800)
Neutrophils Relative %: 73.6 %
Platelets: 212 10*3/uL (ref 140–400)
RBC: 4.84 10*6/uL (ref 4.20–5.80)
RDW: 12.4 % (ref 11.0–15.0)
Total Lymphocyte: 17.6 %
WBC: 6.9 10*3/uL (ref 3.8–10.8)

## 2020-11-26 LAB — COMPLETE METABOLIC PANEL WITH GFR
AG Ratio: 1.9 (calc) (ref 1.0–2.5)
ALT: 26 U/L (ref 9–46)
AST: 18 U/L (ref 10–35)
Albumin: 4.4 g/dL (ref 3.6–5.1)
Alkaline phosphatase (APISO): 65 U/L (ref 35–144)
BUN: 13 mg/dL (ref 7–25)
CO2: 27 mmol/L (ref 20–32)
Calcium: 9.6 mg/dL (ref 8.6–10.3)
Chloride: 104 mmol/L (ref 98–110)
Creat: 1.12 mg/dL (ref 0.70–1.25)
GFR, Est African American: 79 mL/min/{1.73_m2} (ref >=60)
GFR, Est Non African American: 69 mL/min/{1.73_m2} (ref >=60)
Globulin: 2.3 g/dL (calc) (ref 1.9–3.7)
Glucose, Bld: 105 mg/dL — ABNORMAL HIGH (ref 65–99)
Potassium: 4.9 mmol/L (ref 3.5–5.3)
Sodium: 138 mmol/L (ref 135–146)
Total Bilirubin: 0.5 mg/dL (ref 0.2–1.2)
Total Protein: 6.7 g/dL (ref 6.1–8.1)

## 2020-11-26 LAB — HEMOGLOBIN A1C
Hgb A1c MFr Bld: 5.3 % of total Hgb (ref <5.7)
Mean Plasma Glucose: 105 mg/dL
eAG (mmol/L): 5.8 mmol/L

## 2020-11-26 LAB — TSH: TSH: 2.31 mIU/L (ref 0.40–4.50)

## 2020-11-26 LAB — LIPID PANEL
Cholesterol: 164 mg/dL (ref <200)
HDL: 34 mg/dL — ABNORMAL LOW (ref >=40)
LDL Cholesterol (Calc): 95 mg/dL (calc)
Non-HDL Cholesterol (Calc): 130 mg/dL (calc) — ABNORMAL HIGH (ref <130)
Total CHOL/HDL Ratio: 4.8 (calc) (ref <5.0)
Triglycerides: 228 mg/dL — ABNORMAL HIGH (ref <150)

## 2020-11-26 LAB — INSULIN, RANDOM: Insulin: 22.5 u[IU]/mL — ABNORMAL HIGH

## 2020-11-26 LAB — MAGNESIUM: Magnesium: 2.1 mg/dL (ref 1.5–2.5)

## 2020-11-26 LAB — VITAMIN D 25 HYDROXY (VIT D DEFICIENCY, FRACTURES): Vit D, 25-Hydroxy: 53 ng/mL (ref 30–100)

## 2020-11-26 NOTE — Progress Notes (Signed)
========================================================== -   Test results slightly outside the reference range are not unusual. If there is anything important, I will review this with you,  otherwise it is considered normal test values.  If you have further questions,  please do not hesitate to contact me at the office or via My Chart.  ==========================================================  -  Total Chol = 164 and LDL = 95 -  Excellent   - Very low risk for Heart Attack  / Stroke ========================================================  - But , . . . .  . Triglycerides (   228   ) or fats in blood are too high  (goal is less than 150)    - Recommend avoid fried & greasy foods,  sweets / candy,   - Avoid white rice  (brown or wild rice or Quinoa is OK),   - Avoid white potatoes  (sweet potatoes are OK)   - Avoid anything made from white flour  - bagels, doughnuts, rolls, buns, biscuits, white and   wheat breads, pizza crust and traditional  pasta made of white flour & egg white  - (vegetarian pasta or spinach or wheat pasta is OK).    - Multi-grain bread is OK - like multi-grain flat bread or  sandwich thins.   - Avoid alcohol in excess.   - Exercise is also important. ==========================================================  -  A1c - Normal - Great - No Diabetes  ! ==========================================================  -  Vitamin D = 53  - Great  ==========================================================  -  All Else - CBC - Kidneys - Electrolytes - Liver - Magnesium & Thyroid    - all  Normal / OK ====================================================   - Keep up the [place] Work   ! ==========================================================

## 2020-12-08 ENCOUNTER — Other Ambulatory Visit: Payer: Self-pay | Admitting: Internal Medicine

## 2020-12-08 DIAGNOSIS — N401 Enlarged prostate with lower urinary tract symptoms: Secondary | ICD-10-CM

## 2020-12-08 MED ORDER — FINASTERIDE 5 MG PO TABS: ORAL_TABLET | ORAL | 0 refills | Status: DC

## 2020-12-08 MED ORDER — TRAZODONE HCL 150 MG PO TABS
ORAL_TABLET | ORAL | 0 refills | Status: DC
Start: 2020-12-08 — End: 2021-03-06

## 2020-12-08 MED ORDER — MOVANTIK 25 MG PO TABS: ORAL_TABLET | ORAL | 0 refills | Status: DC

## 2021-02-09 ENCOUNTER — Encounter: Payer: Self-pay | Admitting: Adult Health

## 2021-02-09 ENCOUNTER — Other Ambulatory Visit: Payer: Self-pay

## 2021-02-09 ENCOUNTER — Ambulatory Visit
Admission: RE | Admit: 2021-02-09 | Discharge: 2021-02-09 | Disposition: A | Discharge status: Home / Self Care | Payer: Medicare Other | Source: Ambulatory Visit | Attending: Adult Health | Admitting: Adult Health

## 2021-02-09 ENCOUNTER — Ambulatory Visit (INDEPENDENT_AMBULATORY_CARE_PROVIDER_SITE_OTHER): Payer: Medicare Other | Admitting: Adult Health

## 2021-02-09 VITALS — BP 128/80 | HR 89 | Temp 96.3°F | Wt 233.0 lb

## 2021-02-09 DIAGNOSIS — R0989 Other specified symptoms and signs involving the circulatory and respiratory systems: Secondary | ICD-10-CM

## 2021-02-09 DIAGNOSIS — Z1152 Encounter for screening for COVID-19: Secondary | ICD-10-CM | POA: Diagnosis not present

## 2021-02-09 DIAGNOSIS — J441 Chronic obstructive pulmonary disease with (acute) exacerbation: Secondary | ICD-10-CM

## 2021-02-09 DIAGNOSIS — R059 Cough, unspecified: Secondary | ICD-10-CM

## 2021-02-09 DIAGNOSIS — F17210 Nicotine dependence, cigarettes, uncomplicated: Secondary | ICD-10-CM

## 2021-02-09 LAB — POC COVID19 BINAXNOW: SARS Coronavirus 2 Ag: NEGATIVE

## 2021-02-09 MED ORDER — AZITHROMYCIN 250 MG PO TABS: ORAL_TABLET | ORAL | 1 refills | Status: AC

## 2021-02-09 MED ORDER — IPRATROPIUM-ALBUTEROL 0.5-2.5 (3) MG/3ML IN SOLN: 3.0000 mL | Freq: Once | RESPIRATORY_TRACT | Status: DC

## 2021-02-09 MED ORDER — ALBUTEROL SULFATE HFA 108 (90 BASE) MCG/ACT IN AERS: 2.0000 | INHALATION_SPRAY | RESPIRATORY_TRACT | 0 refills | Status: DC | PRN

## 2021-02-09 MED ORDER — PREDNISONE 20 MG PO TABS: ORAL_TABLET | ORAL | 0 refills | Status: DC

## 2021-02-09 MED ORDER — BREZTRI AEROSPHERE 160-9-4.8 MCG/ACT IN AERO: 2.0000 | INHALATION_SPRAY | Freq: Two times a day (BID) | RESPIRATORY_TRACT | 0 refills | Status: DC

## 2021-02-09 NOTE — Progress Notes (Signed)
Assessment and Plan:  Anthony Paul was seen today for acute visit.  Diagnoses and all orders for this visit:  COPD with exacerbation ([place]) Did have negative covid 19 test today  Get CXR r/o pneumonia Mild improvement with duoneb in office  Not on inhaler at baseline - COPD 0 per Dr. Melvyn Novas Continue to [place] breztri - sample given x 2 weeks, continue follow up with Dr. Melvyn Novas for evaluaton of ongoing need Start steroid taper and abx -  The patient was advised to call immediately if he has any concerning symptoms in the interval. The patient voices understanding of current treatment options and is in agreement with the current care plan.The patient knows to call the clinic with any problems, questions or concerns or go to the ER if any further progression of symptoms.  -     predniSONE (DELTASONE) 20 MG tablet; 2 tablets daily for 3 days, 1 tablet daily for 4 days. -     albuterol (VENTOLIN HFA) 108 (90 Base) MCG/ACT inhaler; Inhale 2 puffs into the lungs every 4 (four) hours as needed for wheezing or shortness of breath. Please give generic or the one that insurance covers -     azithromycin (ZITHROMAX) 250 MG tablet; Take 2 tablets (500 mg) on  Day 1,  followed by 1 tablet (250 mg) once daily on Days 2 through 5. -     Budeson-Glycopyrrol-Formoterol (BREZTRI AEROSPHERE) 160-9-4.8 MCG/ACT AERO; Inhale 2 puffs into the lungs in the morning and at bedtime.  Encounter for screening for COVID-19 -     POC COVID-19  Abnormal lung sounds -     DG Chest 2 View; Future -     ipratropium-albuterol (DUONEB) 0.5-2.5 (3) MG/3ML nebulizer solution 3 mL  Cigarette smoker Stopped 2 days ago; discussed importance of smoking cessation  He is receptive; intolerant of chantix per previous trial  Declines Chantix or other medication- unsure about wellbutrin Smoking cessation resources provided Will follow up at the next visit   Further disposition pending results of labs. Discussed med's effects and  SE's.   Over 30 minutes of exam, counseling, chart review, and critical decision making was performed.   Future Appointments  Date Time Provider [place]  03/02/2021 10:30 AM Garnet Sierras, NP [facility] None  06/09/2021  2:30 PM Liane Comber, NP [facility] None  08/18/2021  2:00 PM Liane Comber, NP [facility] None    ------------------------------------------------------------------------------------------------------------------   HPI BP 128/80   Pulse 89   Temp (!) 96.3 F (35.7 C)   Wt 233 lb (105.7 kg)   SpO2 98%   BMI 29.92 kg/m   65 y.o.male smoker with COPD GOLD 0 per Dr. 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 He had negative rapid covid 19 screen today prior to being seen.   He reports sx began 6 days ago, scratchy throat and running nose, mild cough, started taking OTC meds ([facility] brand cold/flu med), reports URI sx seemed to resolve over the weekend, , stopped taking on Sunday.   However he reports started feeling like "things were settling into my chest." reports generalized aching around lower rib cage, "feels like his lungs hurt." Endorses very mild cough, typically with a quick deep breath. Had very mild temp and chills last week, but none in last 2 days. He notes mildly fatigued when walking in yard with dog. Denies dyspnea. Denies significant secretions.    Did stop smoking a few days ago, has  estimated 70+ pack year history-   Past Medical History:  Diagnosis Date  . Abnormal CT scan, chest    Blebs on CT chest but NORMAL PFTs- no COPD  . Aortic stenosis   . Bicuspid aortic valve    Echo 2014  EF 55-60% Mild to moderate AR mild AS with mean gradient 18 mmHg and peak of 25 mmHg. ? Bicuspid valve with dilated aortic root 4.0 cm.  . diverticulosis   . DJD (degenerative joint disease)   . ED (erectile dysfunction)   . Elevated blood pressure reading without diagnosis of  hypertension   . Heart murmur   . Hx of colonic polyp 2015  . Hypercholesteremia      Allergies  Allergen Reactions  . Morphine Itching    Can take hydrocodone without issues    Current Outpatient Medications on File Prior to Visit  Medication Sig  . aspirin 81 MG tablet Take 81 mg by mouth daily.  Marland Kitchen atorvastatin (LIPITOR) 10 MG tablet Take 1 tablet (10 mg total) by mouth daily.  . Cholecalciferol (VITAMIN D) 2000 UNITS CAPS Take 1 capsule by mouth daily. Takes 500 mg  . Cyanocobalamin (VITAMIN B12 PO) Take by mouth daily.  . finasteride (PROSCAR) 5 MG tablet Take       1 tablet        Daily        for Prostate  . Flaxseed, Linseed, (FLAXSEED OIL) 1200 MG CAPS Take 1 capsule by mouth daily.  Marland Kitchen MOVANTIK 25 MG TABS tablet Take      1 tablet       Daily        for Regular BM's  . NARCAN 4 MG/0.1ML LIQD nasal spray kit SMARTSIG:1 Spray(s) Both Nares PRN  . Omega-3 Fatty Acids (FISH OIL PO) Take 1 tablet by mouth daily.  Marland Kitchen oxyCODONE-acetaminophen (PERCOCET) 10-325 MG tablet Take 1 tablet by mouth as needed.  . traZODone (DESYREL) 150 MG tablet Take      1/2 to 1 tablet       1 hour      before Bedtime for Sleep  . vitamin C (ASCORBIC ACID) 250 MG tablet Take 250 mg by mouth daily.  Marland Kitchen acetaminophen (TYLENOL) 500 MG tablet Take 1,500 mg by mouth every 6 (six) hours as needed for mild pain. (Patient not taking: Reported on 02/09/2021)   Current Facility-Administered Medications on File Prior to Visit  Medication  . 0.9 %  sodium chloride infusion    ROS: all negative except above.   Physical Exam:  BP 128/80   Pulse 89   Temp (!) 96.3 F (35.7 C)   Wt 233 lb (105.7 kg)   SpO2 98%   BMI 29.92 kg/m   General Appearance: Well nourished, well dressed elder male in no apparent distress. Eyes: PERRLA, conjunctiva no swelling or erythema Sinuses: No Frontal/maxillary tenderness ENT/Mouth: Ext aud canals clear, TMs without erythema, bulging. No erythema, swelling, or exudate on post  pharynx.  Tonsils not swollen or erythematous. Hearing normal.  Neck: Supple, thyroid normal.  Respiratory: Respiratory effort normal, BS equal bilaterally with scatted rales, rhonchi throughout, without wheezing or stridor. No significant clearing with cough.  Cardio: RRR with no MRGs. Brisk peripheral pulses without edema.  Abdomen: Soft, + BS.  Non tender, no guarding, rebound, hernias, masses. Lymphatics: Non tender without lymphadenopathy.  Musculoskeletal: Symmetrical strenghth, no obvious deformity, chest wall non-tender, normal gait.  Skin: Warm, dry without rashes, lesions, ecchymosis.  Neuro: Normal muscle  tone Psych: Awake and oriented X 3, normal affect, Insight and Judgment appropriate.     Izora Ribas, NP 12:46 PM [facility]

## 2021-02-09 NOTE — Patient Instructions (Addendum)
Please get chest xray at [facility]  STOP smoking - see resources below   Start prednisone taper -   Breztri - 1 puff twice daily, rinse mouth out afterwards  Albuterol 1-2 puffs every 4-6 hours as needed only if any wheezing/shortness of breath    SMOKING CESSATION  [employer] society  [phone] for more information or for a free program for smoking cessation help.   You can call QUIT SMART 1-800-QUIT-NOW for free nicotine patches or replacement therapy- if they are out- keep calling  [facility] Can call for smoking cessation classes, [phone]  If you have a smart phone, please look up Smoke Free app, this will help you stay on track and give you information about money you have saved, life that you have gained back and a ton of more information.     ADVANTAGES OF QUITTING SMOKING  Within 20 minutes, blood pressure decreases. Your pulse is at normal level.  After 8 hours, carbon monoxide levels in the blood return to normal. Your oxygen level increases.  After 24 hours, the chance of having a heart attack starts to decrease. Your breath, hair, and body stop smelling like smoke.  After 48 hours, damaged nerve endings begin to recover. Your sense of taste and smell improve.  After 72 hours, the body is virtually free of nicotine. Your bronchial tubes relax and breathing becomes easier.  After 2 to 12 weeks, lungs can hold more air. Exercise becomes easier and circulation improves.  After 1 year, the risk of coronary heart disease is cut in half.  After 5 years, the risk of stroke falls to the same as a nonsmoker.  After 10 years, the risk of lung cancer is cut in half and the risk of other cancers decreases significantly.  After 15 years, the risk of coronary heart disease drops, usually to the level of a nonsmoker.  You will have extra money to spend on things other than cigarettes.        Budesonide;  Glycopyrrolate; Formoterol inhalation aerosol What is this medicine? BUDESONIDE; GLYCOPYRROLATE; FORMOTEROL (byoo DES oh nide; glye koe PYE roe late; for [facility] te rol) inhalation is a combination of 3 medicines. Budesonide decreases inflammation in the lungs. Formoterol and glycopyrrolate are bronchodilators that help keep airways open. This medicine is used to treat chronic obstructive pulmonary disease (COPD). Do not use this drug combination for acute COPD attacks or bronchospasm. This medicine may be used for other purposes; ask your health care provider or pharmacist if you have questions. COMMON BRAND NAME(S): BREZTRI What should I tell my health care provider before I take this medicine? They need to know if you have any of these conditions:  bladder problems or trouble passing urine  bone problems  diabetes  eye disease, vision problems  heart disease  high blood pressure  history of irregular heartbeat  immune system problems  infection  kidney disease  pheochromocytoma  prostate disease  seizures  thyroid disease  an unusual or allergic reaction to budesonide, formoterol, glycopyrrolate, medicines, foods, dyes, or preservatives  pregnant or trying to get pregnant  breast-feeding How should I use this medicine? This medicine is inhaled through the mouth. Follow the directions on the prescription label. Shake well before each use. Rinse your mouth with water after use. Make sure not to swallow the water. Take your medicine at regular intervals. Do not take your medicine more often than directed. Do not stop  taking except on your doctor's advice. Make sure that you are using your inhaler correctly. This drug comes with INSTRUCTIONS FOR USE. Ask your pharmacist for directions on how to use this drug. Read the information carefully. Talk to your pharmacist or health care provider if you have questions. Talk to your pediatrician regarding the use of this medicine in  children. This medicine is not approved for use in children. Overdosage: If you think you have taken too much of this medicine contact a poison control center or emergency room at once. NOTE: This medicine is only for you. Do not share this medicine with others. What if I miss a dose? If you miss a dose, use it as soon as you can. If it is almost time for your next dose, use only that dose. Do not use double or extra doses. What may interact with this medicine? Do not take the medicine with any of the following medications:  cisapride  dofetilide  dronedarone  MAOIs like Marplan, Nardil, and Parnate  other medicines that contain long-acting beta-2 agonists (LABAs) like arformoterol, formoterol, indacaterol, olodaterol, salmeterol, vilanterol  pimozide  procarbazine  thioridazine This medicine may also interact with the following medications:  antihistamines for allergy, cough, and cold  atropine  certain antibiotics like clarithromycin, telithromycin  certain antivirals for HIV or hepatitis  certain heart medicines like atenolol, metoprolol  certain medicines for bladder problems like oxybutynin, tolterodine  certain medicines for blood pressure, heart disease, irregular heartbeat  certain medicines for depression, anxiety, or psychotic disturbances  certain medicines for fungal infections like ketoconazole, itraconazole  certain medicines for Parkinson's disease like benztropine, trihexyphenidyl  certain medicines for stomach problems like dicyclomine, hyoscyamine  certain medicines for travel sickness like scopolamine  diuretics  grapefruit juice  mifepristone  other inhaled medicines that contain anticholinergics such as aclidinium, ipratropium, tiotropium, umeclidinium  other medicines that prolong the QT interval (an abnormal heart rhythm)  some vaccines  steroid medicines like prednisone or cortisone  stimulant medicines for attention disorders,  weight loss, or to stay awake  theophylline This list may not describe all possible interactions. Give your health care provider a list of all the medicines, herbs, non-prescription drugs, or dietary supplements you use. Also tell them if you smoke, drink alcohol, or use illegal drugs. Some items may interact with your medicine. What should I watch for while using this medicine? Visit your health care professional for regular checks on your progress. Tell your health care professional if your symptoms do not start to get better or if they get worse. NEVER use this medicine for an acute asthma or COPD attack. You should use your short-acting rescue inhalers for this purpose. If your symptoms get worse or if you need your short-acting inhalers more often, call your doctor right away. This medicine may increase your risk of getting an infection. Tell your doctor or health care professional if you are around anyone with measles or chickenpox, or if you develop sores or blisters that do not heal properly. Do not get this medicine in your eyes. It can cause irritation, pain, or blurred vision. What side effects may I notice from receiving this medicine? Side effects that you should report to your doctor or health care professional as soon as possible:  allergic reactions like skin rash, itching or hives, swelling of the face, lips, or tongue  anxious  breathing problems  changes in vision, eye pain  muscle cramps or muscle pain  signs and symptoms of a  dangerous change in heartbeat or heart rhythm like chest pain; dizziness; fast or irregular heartbeat; palpitations; feeling faint or lightheaded, falls; breathing problems  signs and symptoms of high blood sugar such as being more thirsty or hungry or having to urinate more than normal. You may also feel very tired or have blurry vision  signs and symptoms of infection like fever; chills; cough; sore throat; pain or trouble passing  urine  tremors  trouble passing urine or change in the amount of urine  unusually weak or tired  white patches in the mouth or mouth sores Side effects that usually do not require medical attention (report these to your doctor or health care professional if they continue or are bothersome):  back pain  changes in taste  cough  diarrhea  runny or stuffy nose  upset stomach This list may not describe all possible side effects. Call your doctor for medical advice about side effects. You may report side effects to FDA at 1-800-FDA-1088. Where should I keep my medicine? Keep out of the reach of children. Store in a dry place at room temperature between 15 and 30 degrees C (59 and 86 degrees F). Protect from heat. Do not freeze. Do not use or store near heat or flame, as the canister may burst. Throw away the inhaler 3 months after you open the foil pouch (for the 120-inhalation canister), or 3 weeks after you open the foil pouch (for the 28-inhalation canister), or when the dose indicator reaches zero "0", whichever comes first. NOTE: This sheet is a summary. It may not cover all possible information. If you have questions about this medicine, talk to your doctor, pharmacist, or health care provider.  2021 [employer]vier/Gold Standard (2019-07-16 18:58:00)

## 2021-03-02 ENCOUNTER — Other Ambulatory Visit: Payer: Self-pay

## 2021-03-02 ENCOUNTER — Ambulatory Visit (INDEPENDENT_AMBULATORY_CARE_PROVIDER_SITE_OTHER): Payer: Medicare Other | Admitting: Adult Health Nurse Practitioner

## 2021-03-02 ENCOUNTER — Encounter: Payer: Self-pay | Admitting: Adult Health Nurse Practitioner

## 2021-03-02 VITALS — BP 130/76 | HR 81 | Temp 97.6°F | Wt 239.0 lb

## 2021-03-02 DIAGNOSIS — E782 Mixed hyperlipidemia: Secondary | ICD-10-CM | POA: Diagnosis not present

## 2021-03-02 DIAGNOSIS — Q231 Congenital insufficiency of aortic valve: Secondary | ICD-10-CM

## 2021-03-02 DIAGNOSIS — R0989 Other specified symptoms and signs involving the circulatory and respiratory systems: Secondary | ICD-10-CM

## 2021-03-02 DIAGNOSIS — Q2381 Bicuspid aortic valve: Secondary | ICD-10-CM

## 2021-03-02 DIAGNOSIS — R109 Unspecified abdominal pain: Secondary | ICD-10-CM

## 2021-03-02 DIAGNOSIS — F17210 Nicotine dependence, cigarettes, uncomplicated: Secondary | ICD-10-CM

## 2021-03-02 DIAGNOSIS — Z6830 Body mass index (BMI) 30.0-30.9, adult: Secondary | ICD-10-CM

## 2021-03-02 DIAGNOSIS — R030 Elevated blood-pressure reading, without diagnosis of hypertension: Secondary | ICD-10-CM

## 2021-03-02 DIAGNOSIS — Z79899 Other long term (current) drug therapy: Secondary | ICD-10-CM | POA: Diagnosis not present

## 2021-03-02 DIAGNOSIS — G894 Chronic pain syndrome: Secondary | ICD-10-CM | POA: Diagnosis not present

## 2021-03-02 DIAGNOSIS — R7309 Other abnormal glucose: Secondary | ICD-10-CM

## 2021-03-02 DIAGNOSIS — E559 Vitamin D deficiency, unspecified: Secondary | ICD-10-CM

## 2021-03-02 DIAGNOSIS — J439 Emphysema, unspecified: Secondary | ICD-10-CM

## 2021-03-02 LAB — CBC WITH DIFFERENTIAL/PLATELET
Absolute Monocytes: 935 cells/uL (ref 200–950)
Basophils Absolute: 27 cells/uL (ref 0–200)
Basophils Relative: 0.3 %
Eosinophils Absolute: 160 cells/uL (ref 15–500)
Eosinophils Relative: 1.8 %
HCT: 38.5 % (ref 38.5–50.0)
Hemoglobin: 13.1 g/dL — ABNORMAL LOW (ref 13.2–17.1)
Lymphs Abs: 1308 cells/uL (ref 850–3900)
MCH: 32 pg (ref 27.0–33.0)
MCHC: 34 g/dL (ref 32.0–36.0)
MCV: 94.1 fL (ref 80.0–100.0)
MPV: 11 fL (ref 7.5–12.5)
Monocytes Relative: 10.5 %
Neutro Abs: 6470 cells/uL (ref 1500–7800)
Neutrophils Relative %: 72.7 %
Platelets: 241 10*3/uL (ref 140–400)
RBC: 4.09 10*6/uL — ABNORMAL LOW (ref 4.20–5.80)
RDW: 12.4 % (ref 11.0–15.0)
Total Lymphocyte: 14.7 %
WBC: 8.9 10*3/uL (ref 3.8–10.8)

## 2021-03-02 LAB — COMPLETE METABOLIC PANEL WITH GFR
AG Ratio: 1.8 (calc) (ref 1.0–2.5)
ALT: 23 U/L (ref 9–46)
AST: 18 U/L (ref 10–35)
Albumin: 4.1 g/dL (ref 3.6–5.1)
Alkaline phosphatase (APISO): 58 U/L (ref 35–144)
BUN: 11 mg/dL (ref 7–25)
CO2: 25 mmol/L (ref 20–32)
Calcium: 9.3 mg/dL (ref 8.6–10.3)
Chloride: 106 mmol/L (ref 98–110)
Creat: 1.19 mg/dL (ref 0.70–1.25)
GFR, Est African American: 73 mL/min/{1.73_m2} (ref >=60)
GFR, Est Non African American: 63 mL/min/{1.73_m2} (ref >=60)
Globulin: 2.3 g/dL (calc) (ref 1.9–3.7)
Glucose, Bld: 92 mg/dL (ref 65–99)
Potassium: 4.3 mmol/L (ref 3.5–5.3)
Sodium: 139 mmol/L (ref 135–146)
Total Bilirubin: 0.5 mg/dL (ref 0.2–1.2)
Total Protein: 6.4 g/dL (ref 6.1–8.1)

## 2021-03-02 LAB — LIPID PANEL
Cholesterol: 152 mg/dL (ref <200)
HDL: 32 mg/dL — ABNORMAL LOW (ref >=40)
LDL Cholesterol (Calc): 84 mg/dL (calc)
Non-HDL Cholesterol (Calc): 120 mg/dL (calc) (ref <130)
Total CHOL/HDL Ratio: 4.8 (calc) (ref <5.0)
Triglycerides: 301 mg/dL — ABNORMAL HIGH (ref <150)

## 2021-03-02 MED ORDER — PREDNISONE 20 MG PO TABS: ORAL_TABLET | ORAL | 0 refills | Status: AC

## 2021-03-02 NOTE — Progress Notes (Signed)
FOLLOW UP 3 MONTH Assessment and Plan:   Borderline systolic HTN - continue medications, DASH diet, exercise and monitor at home. Call if greater than 130/80.  -     CBC with Differential/Platelet -     COMPLETE METABOLIC PANEL WITH GFR  COPD GOLD 0  Pulmonary emyphasema Advised to stop smoking Continue follow up pulmonary PRN   Mixed hyperlipidemia -continue CRESTOR - check lipids, decrease fatty foods, increase activity.  -     Lipid panel  Abnormal glucose Discussed disease progression and risks Discussed diet/exercise, weight management and risk modification  Bicuspid aortic valve Cardiology yearly No symptoms at this time  Medication management -     Magnesium  Vitamin D deficiency -     VITAMIN D 25 Hydroxy (Vit-D Deficiency, Fractures)  Cigarette smoker Smoking cessation-  instruction/counseling given, counseled patient on the dangers of tobacco use, advised patient to stop smoking, and reviewed strategies to maximize success, patient not ready to quit at this time.  Smoking less than 1 pack a day -lung cancer screening with low dose CT discussed yearly as recommended by guidelines based on age, number of pack year history.  Discussed risks of screening including but not limited to false positives on xray, further testing or consultation with specialist, and possible false negative CT as well. Understanding expressed and wishes to proceed with CT testing. Order is already placed.    Osteoarthritis, unspecified osteoarthritis type, unspecified site Bilateral rib pain Continue pain management- ?  Has failed cymbalta, lyrica, gabaptentin Failed trigger point, failed spinal stimulator Massage and pressure belt helps  BMI 30.0-30.9,adult  Overweight  - long discussion about weight loss, diet, and exercise -recommended diet heavy in fruits and veggies and low in animal meats, cheeses, and dairy products   Erectile dysfunction, unspecified erectile dysfunction  type -     Testosterone  Discussed med's effects and SE's. Screening labs and tests as requested with regular follow-up as recommended. Over 40 minutes of exam, counseling, chart review and critical decision making was performed Future Appointments  Date Time Provider [place]  06/09/2021  2:30 PM Liane Comber, NP [facility] None  08/18/2021  2:00 PM Liane Comber, NP [facility] None   HPI Patient presents for a 3 month follow up for  He has COPD GOLD 0 ; Bicuspid aortic valve; Mixed hyperlipidemia; Borderline systolic HTN; ED (erectile dysfunction); DJD (degenerative joint disease); Abnormal glucose; Vitamin D deficiency; Medication management; Chest pain, atypical most c/w ibs ; Cigarette smoker; Chronic left shoulder pain; Abdominal pain; Trigger point of abdomen; Congenital insufficiency of aortic valve; and Aortic atherosclerosis ([place]) on their problem list.  He was last seen in our office on 02/09/21 for acute COPD exacerbation. He sees pain management for bilateral rib pain, some flank pain, no radiation to his back, states pain is getting worse, has tried lyrica/gabpentin without help,no help with Cymbalta.   Had tried trigger points, failed the trial of the spinal stimulator, has done inhalers for possible pleuritis that did not help. Had recent normal AB [place], and GI visit.  Pain is better in the AM and worse in the evening.  No worsening pain with exercise or excretion, just helped daughter build deck and it did not make the pain worse.   He continues to smoke, started age 73, smoked a pack a day from 35 yr to 66 years old 40+ pack year history, but states he has decreased to less than a pack, is not on an maintenance inhaler.  He  wants to quit smoking, he is going to pick a stop date.  Also talked about taper stop as well.  He has tried chantix in the past and did not tolerate this well.  His blood pressure has been controlled at home, today their BP is BP: 130/76   He  has a bicuspid aortic valve, follows with cardiology, has appointment in July.  Echo 06/2020- EF normal mild AS and mild to moderate AR stable gradients- mild aortic root dilitation and stress test normal 09/2016. He does workout. He denies chest pain, shortness of breath, dizziness.   BMI is Body mass index is 30.69 kg/m., he is working on diet and exercise. Wt Readings from Last 3 Encounters:  03/02/21 239 lb (108.4 kg)  02/09/21 233 lb (105.7 kg)  11/25/20 232 lb 3.2 oz (105.3 kg)   He is on cholesterol medication, atorvastatin 10 and denies myalgias. His cholesterol is not at goal less than 70. He is a smoker.  The cholesterol last visit was:   Lab Results  Component Value Date   CHOL 164 11/25/2020   HDL 34 (L) 11/25/2020   LDLCALC 95 11/25/2020   TRIG 228 (H) 11/25/2020   CHOLHDL 4.8 11/25/2020    Last A1C in the office was:  Lab Results  Component Value Date   HGBA1C 5.3 11/25/2020   Last GFR: Lab Results  Component Value Date   GFRNONAA 69 11/25/2020    Patient is on Vitamin D supplement.   Lab Results  Component Value Date   VD25OH 53 11/25/2020      Current Medications:     Current Outpatient Medications (Cardiovascular):  .  atorvastatin (LIPITOR) 10 MG tablet, Take 1 tablet (10 mg total) by mouth daily.   Current Outpatient Medications (Respiratory):  .  albuterol (VENTOLIN HFA) 108 (90 Base) MCG/ACT inhaler, Inhale 2 puffs into the lungs every 4 (four) hours as needed for wheezing or shortness of breath. Please give generic or the one that insurance covers .  Budeson-Glycopyrrol-Formoterol (BREZTRI AEROSPHERE) 160-9-4.8 MCG/ACT AERO, Inhale 2 puffs into the lungs in the morning and at bedtime.  Current Facility-Administered Medications (Respiratory):  .  ipratropium-albuterol (DUONEB) 0.5-2.5 (3) MG/3ML nebulizer solution 3 mL  Current Outpatient Medications (Analgesics):  .  acetaminophen (TYLENOL) 500 MG tablet, Take 1,500 mg by mouth every 6 (six)  hours as needed for mild pain. Marland Kitchen  aspirin 81 MG tablet, Take 81 mg by mouth daily. Marland Kitchen  oxyCODONE-acetaminophen (PERCOCET) 10-325 MG tablet, Take 1 tablet by mouth as needed.   Current Outpatient Medications (Hematological):  Marland Kitchen  Cyanocobalamin (VITAMIN B12 PO), Take by mouth daily.   Current Outpatient Medications (Other):  Marland Kitchen  Cholecalciferol (VITAMIN D) 2000 UNITS CAPS, Take 1 capsule by mouth daily. Takes 500 mg .  finasteride (PROSCAR) 5 MG tablet, Take       1 tablet        Daily        for Prostate .  Flaxseed, Linseed, (FLAXSEED OIL) 1200 MG CAPS, Take 1 capsule by mouth daily. Marland Kitchen  MOVANTIK 25 MG TABS tablet, Take      1 tablet       Daily        for Regular BM's .  NARCAN 4 MG/0.1ML LIQD nasal spray kit, SMARTSIG:1 Spray(s) Both Nares PRN .  Omega-3 Fatty Acids (FISH OIL PO), Take 1 tablet by mouth daily. .  traZODone (DESYREL) 150 MG tablet, Take      1/2 to 1 tablet  1 hour      before Bedtime for Sleep .  vitamin C (ASCORBIC ACID) 250 MG tablet, Take 250 mg by mouth daily.  Current Facility-Administered Medications (Other):  .  0.9 %  sodium chloride infusion  Allergies:  Allergies  Allergen Reactions  . Morphine Itching    Can take hydrocodone without issues   Health Maintenance:  Health Maintenance  Topic Date Due  . COVID-19 Vaccine (3 - Booster for Pfizer series) 09/02/2020  . COLONOSCOPY (Pts 45-68yr Insurance coverage will need to be confirmed)  01/13/2025  . TETANUS/TDAP  03/08/2025  . INFLUENZA VACCINE  Completed  . Hepatitis C Screening  Completed  . PNA vac Low Risk Adult  Completed  . HPV VACCINES  Aged Out   Immunization History  Administered Date(s) Administered  . Influenza Inj Mdck Quad With Preservative 10/24/2017, 10/31/2018  . Influenza Split 10/05/2015  . Influenza, High Dose Seasonal PF 11/25/2020  . Influenza,Quad,Nasal, Live 10/05/2015  . Influenza,inj,Quad PF,6+ Mos 11/15/2013, 10/16/2019  . Influenza,inj,quad, With Preservative  09/26/2016  . Influenza-Unspecified 12/19/2009  . PFIZER(Purple Top)SARS-COV-2 Vaccination 02/10/2020, 03/02/2020  . PPD Test 03/09/2015, 03/14/2016  . Pneumococcal Conjugate-13 11/15/2013  . Pneumococcal Polysaccharide-23 05/07/2020  . Pneumococcal-Unspecified 12/20/1995  . Td 12/19/2004  . Tdap 03/09/2015    DEXA: N/A Colonoscopy:  12/2019, Dr. AHavery Moros on bASA 5 year follow up EGD: N/A PFTs  04/2018 CT chest 03/2018 at [facility]- plaque on aortic arch CXR 2015 Echo 10/2017 Stress test 09/2016  Eye Exam: None Dentist: Dr. KJacquenette Shone Patient Care Team: MUnk Pinto MD as PCP - General (Internal Medicine) NJosue Hector MD as PCP - Cardiology (Cardiology)  Medical History:  has COPD GOLD 0 ; Bicuspid aortic valve; Mixed hyperlipidemia; Borderline systolic HTN; ED (erectile dysfunction); DJD (degenerative joint disease); Abnormal glucose; Vitamin D deficiency; Medication management; Chest pain, atypical most c/w ibs ; Cigarette smoker; Chronic left shoulder pain; Abdominal pain; Trigger point of abdomen; Congenital insufficiency of aortic valve; and Aortic atherosclerosis ([place] on their problem list. Surgical History:  He  has a past surgical history that includes Colon surgery (Jan 2012); Tonsillectomy; Hemorroidectomy; Colonoscopy (01/13/2014); Upper gi endoscopy (2015); Inguinal hernia repair (Right, 2015); Resection distal clavical (Right); and Upper gastrointestinal endoscopy. Family History:  His family history includes Benign prostatic hyperplasia in his father; Cancer in his sister; Diabetes in his mother; Heart disease in his father; Kidney disease in his sister. Social History:   reports that he has been smoking cigarettes. He started smoking about 49 years ago. He has a 70.50 pack-year smoking history. He quit smokeless tobacco use about 14 years ago.  His smokeless tobacco use included chew. He reports current alcohol use of about 5.0 standard drinks of alcohol per  week. He reports that he does not use drugs.   Review of Systems:  Review of Systems  Constitutional: Negative for chills, diaphoresis, fever, malaise/fatigue and weight loss.  HENT: Negative for congestion, ear discharge, ear pain, hearing loss, nosebleeds, sinus pain, sore throat and tinnitus.   Eyes: Negative for blurred vision, double vision, photophobia, pain, discharge and redness.  Respiratory: Negative for cough, hemoptysis, sputum production, shortness of breath, wheezing and stridor.   Cardiovascular: Negative for chest pain, palpitations, orthopnea, claudication, leg swelling and PND.  Gastrointestinal: Negative for abdominal pain, blood in stool, constipation, diarrhea, heartburn, melena, nausea and vomiting.  Genitourinary: Negative for dysuria, flank pain, frequency, hematuria and urgency.  Musculoskeletal: Negative for back pain, falls, joint pain, myalgias and neck pain.  Skin: Negative for itching and rash.  Neurological: Negative for dizziness, tingling, tremors, sensory change, speech change, focal weakness, seizures, loss of consciousness, weakness and headaches.  Endo/Heme/Allergies: Negative for environmental allergies and polydipsia. Does not bruise/bleed easily.  Psychiatric/Behavioral: Negative for depression, hallucinations, memory loss, substance abuse and suicidal ideas. The patient is not nervous/anxious and does not have insomnia.     Physical Exam: Estimated body mass index is 30.69 kg/m as calculated from the following:   Height as of 11/25/20: 6' 2"  (1.88 m).   Weight as of this encounter: 239 lb (108.4 kg). BP 130/76   Pulse 81   Temp 97.6 F (36.4 C)   Wt 239 lb (108.4 kg)   SpO2 99%   BMI 30.69 kg/m    General Appearance: Well nourished, in no apparent distress.  Eyes: PERRLA, EOMs, conjunctiva no swelling or erythema, normal fundi and vessels.  Sinuses: No Frontal/maxillary tenderness  ENT/Mouth: Ext aud canals clear, normal light reflex with TMs  without erythema, bulging. Good dentition. No erythema, swelling, or exudate on post pharynx. Tonsils not swollen or erythematous. Hearing normal.  Respiratory: Respiratory effort normal, BS equal bilaterally without rales, rhonchi, wheezing or stridor.  Cardio: RRR without murmurs, rubs or gallops. Brisk peripheral pulses without edema.  Chest: symmetric, with normal excursions and percussion.  Abdomen: Soft, general tenderness, no guarding, rebound, hernias, masses, or organomegaly.  Lymphatics: Non tender without lymphadenopathy.  Musculoskeletal: Full ROM all peripheral extremities,5/5 strength, and normal gait.  Skin: Warm, dry without rashes, lesions, ecchymosis. Neuro: Cranial nerves intact, reflexes equal bilaterally. Normal muscle tone, no cerebellar symptoms. Sensation intact.  Psych: Awake and oriented X 3, normal affect, Insight and Judgment appropriate.     Anthony Paul, Laqueta Jean, DNP [facility] 03/02/2021  11:48 AM

## 2021-03-05 ENCOUNTER — Other Ambulatory Visit: Payer: Self-pay | Admitting: Adult Health Nurse Practitioner

## 2021-03-05 DIAGNOSIS — D649 Anemia, unspecified: Secondary | ICD-10-CM

## 2021-03-05 NOTE — Progress Notes (Signed)
Blood count, hemoglobin is on low end of normal.  Would like you to contact office next week to schedule lab only visit for end of next week.  Want to re-check the hemoglobin to make sure it is not continuing to drop as this is abnormal trend for you.  Kidney liver function are in normal range.  Cholesterol: LDL is to goal, triglycerides increased from 228 to 301. Decrease carbohydrates, white bread, rice pasta, white potatoes and sugars.  IF this continues to increase we need to discuss adding a fenofibrate to reduce this.  Garnet Sierras, Laqueta Jean, DNP [facility] 03/05/2021  10:10 AM

## 2021-03-06 ENCOUNTER — Other Ambulatory Visit: Payer: Self-pay | Admitting: Internal Medicine

## 2021-03-06 DIAGNOSIS — N138 Other obstructive and reflux uropathy: Secondary | ICD-10-CM

## 2021-03-06 DIAGNOSIS — N401 Enlarged prostate with lower urinary tract symptoms: Secondary | ICD-10-CM

## 2021-04-05 NOTE — Patient Instructions (Signed)
Diverticulitis   Diverticulitis is infection or inflammation of small pouches (diverticula) in the colon that form due to a condition called diverticulosis. Diverticula can trap stool (feces) and bacteria, causing infection and inflammation. Diverticulitis may cause severe stomach pain and diarrhea. It may lead to tissue damage in the colon that causes bleeding or blockage. The diverticula may also burst (rupture) and cause infected stool to enter other areas of the abdomen. What are the causes? This condition is caused by stool becoming trapped in the diverticula, which allows bacteria to grow in the diverticula. This leads to inflammation and infection. What increases the risk? You are more likely to develop this condition if you have diverticulosis. The risk increases if you:  Are overweight or obese.  Do not get enough exercise.  Drink alcohol.  Use tobacco products.  Eat a diet that has a lot of red meat such as beef, pork, or lamb.  Eat a diet that does not include enough fiber. High-fiber foods include fruits, vegetables, beans, nuts, and whole grains.  Are over 3 years of age. What are the signs or symptoms? Symptoms of this condition may include:  Pain and tenderness in the abdomen. The pain is normally located on the left side of the abdomen, but it may occur in other areas.  Fever and chills.  Nausea.  Vomiting.  Cramping.  Bloating.  Changes in bowel routines.  Blood in your stool. How is this diagnosed? This condition is diagnosed based on:  Your medical history.  A physical exam.  Tests to make sure there is nothing else causing your condition. These tests may include: ? Blood tests. ? Urine tests. ? CT scan of the abdomen. How is this treated? Most cases of this condition are mild and can be treated at home. Treatment may include:  Taking over-the-counter pain medicines.  Following a clear liquid diet.  Taking antibiotic medicines by  mouth.  Resting. More severe cases may need to be treated at a hospital. Treatment may include:  Not eating or drinking.  Taking prescription pain medicine.  Receiving antibiotic medicines through an IV.  Receiving fluids and nutrition through an IV.  Surgery. When your condition is under control, your health care provider may recommend that you have a colonoscopy. This is an exam to look at the entire large intestine. During the exam, a lubricated, bendable tube is inserted into the anus and then passed into the rectum, colon, and other parts of the large intestine. A colonoscopy can show how severe your diverticula are and whether something else may be causing your symptoms. Follow these instructions at home: Medicines  Take over-the-counter and prescription medicines only as told by your health care provider. These include fiber supplements, probiotics, and stool softeners.  If you were prescribed an antibiotic medicine, take it as told by your health care provider. Do not stop taking the antibiotic even if you start to feel better.  Ask your health care provider if the medicine prescribed to you requires you to avoid driving or using machinery. Eating and drinking  Follow a full liquid diet or another diet as directed by your health care provider.  After your symptoms improve, your health care provider may tell you to change your diet. He or she may recommend that you eat a diet that contains at least 25 grams (25 g) of fiber daily. Fiber makes it easier to pass stool. Healthy sources of fiber include: ? Berries. One cup contains 4-8 grams of fiber. ?  Beans or lentils. One-half cup contains 5-8 grams of fiber. ? Green vegetables. One cup contains 4 grams of fiber.  Avoid eating red meat.  General instructions  Do not use any products that contain nicotine or tobacco, such as cigarettes, e-cigarettes, and chewing tobacco. If you need help quitting, ask your health care  provider.  Exercise for at least 30 minutes, 3 times each week. You should exercise hard enough to raise your heart rate and break a sweat.  Keep all follow-up visits as told by your health care provider. This is important. You may need to have a colonoscopy. Contact a health care provider if:  Your pain does not improve.  Your bowel movements do not return to normal. Get help right away if:  Your pain gets worse.  Your symptoms do not get better with treatment.  Your symptoms suddenly get worse.  You have a fever.  You vomit more than one time.  You have stools that are bloody, black, or tarry. Summary  Diverticulitis is infection or inflammation of small pouches (diverticula) in the colon that form due to a condition called diverticulosis. Diverticula can trap stool (feces) and bacteria, causing infection and inflammation.  You are at higher risk for this condition if you have diverticulosis and you eat a diet that does not include enough fiber.  Most cases of this condition are mild and can be treated at home. More severe cases may need to be treated at a hospital.  When your condition is under control, your health care provider may recommend that you have an exam called a colonoscopy. This exam can show how severe your diverticula are and whether something else may be causing your symptoms.  Keep all follow-up visits as told by your health care provider. This is important.

## 2021-04-05 NOTE — Progress Notes (Signed)
Future Appointments  Date Time Provider [place]  04/06/2021 11:00 AM Unk Pinto, MD [facility] None  06/09/2021  2:30 PM Liane Comber, NP [facility] None  08/18/2021  2:00 PM Liane Comber, NP [facility] None    History of Present Illness:      This very nice 65 y.o.  MWM with HTN, COPD/Asthma, HLD, Pre-Diabetes and Vitamin D Deficiency presents with concerns of possible Diverticulitis.  He describes aching /cramping LLQ discomfort , ? low grade fever. Colonoscopy in Jan 2021 by Dr Havery Moros did find polyps and diverticula in the Transverse & Left Colon. (+)  prior hx/o Diverticulitis. (Reports prior intolerance to Flagyl)   Medications  .  atorvastatin (LIPITOR) 10 MG tablet, Take 1 tablet (10 mg total) by mouth daily. Marland Kitchen  albuterol HFA   inhaler, Inhale 2 puffs into the lungs every 4 (four) hours as needed for wheezing or shortness of breath. Marland Kitchen  BREZTRI AEROSPHERE 160-9-4.8, Inhale 2 puffs into the lungs in the morning and at bedtime. Marland Kitchen  ipratropium-albuterol (DUONEB) 0.5-2.5 (3) MG/3ML nebulizer solution 3 mL .  acetaminophen 500 MG tablet, Take 1,500 mg by mouth every 6 (six) hours as needed for mild pain. Marland Kitchen  aspirin 81 MG tablet, Take 81 mg by mouth daily.  Marland Kitchen  oxyCODONE-acetaminophen (PERCOCET) 10-325 MG tablet, Take 1 tablet by mouth as needed.  Marland Kitchen  VITAMIN B12 tab  Take daily. .  Cholecalciferol (VITAMIN D) 2000 UNITS CAPS, Take 1 capsule by mouth daily. Takes 500 mg .  finasteride (PROSCAR) 5 MG tablet, TAKE 1 TABLET DAILY FOR PROSTATE .  Flaxseed, Linseed, (FLAXSEED OIL) 1200 MG CAPS, Take 1 capsule by mouth daily. Marland Kitchen  MOVANTIK 25 MG TABS tablet, TAKE 1 TABLET DAILY FOR REGULAR BM'S .  NARCAN 4 MG/0.1ML LIQD nasal spray kit, SMARTSIG:1 Spray(s) Both Nares PRN .  Omega-3 Fatty Acids (FISH OIL PO), Take 1 tablet by mouth daily. .  traZODone (DESYREL) 150 MG tablet, TAKE 1/2 TO 1 TABLET 1 HOUR BEFORE BEDTIME FOR SLEEP .  vitamin C  250 MG tablet, Take 250 mg by  mouth daily.  Problem list  He has COPD GOLD 0 ; Bicuspid aortic valve; Mixed hyperlipidemia; Borderline systolic HTN; ED (erectile dysfunction); DJD (degenerative joint disease); Abnormal glucose; Vitamin D deficiency; Medication management; Chest pain, atypical most c/w ibs ; Cigarette smoker; Chronic left shoulder pain; Abdominal pain; Trigger point of abdomen; Congenital insufficiency of aortic valve; and Aortic atherosclerosis ([facility]) on their problem list.   Observations/Objective:  BP 126/74   Pulse 66   Temp 98.4 F (36.9 C)   Resp 16   Ht 6' 1.3" (1.862 m)   Wt 239 lb 3.2 oz (108.5 kg)   SpO2 99%   BMI 31.30 kg/m   No rash, cyanosis, icterus.   HEENT - WNL. Neck - supple.  Chest - Clear equal BS. Cor - Nl HS. RRR w/o sig MGR. PP 1(+). No edema.\ Abd. Soft. (+) LLQ tender guarding w/o RB. BS - Nl.       MS- FROM w/o deformities.  Gait Nl. Neuro -  Nl w/o focal abnormalities.  Assessment and Plan:   1. Diverticulitis  - CBC with Differential/Platelet  - ciprofloxacin (CIPRO) 500 MG tablet;  Take  1 tablet  2 x /day  with Meals for Infection   Disp: 20 tablet; Refill: 1  - doxycycline (VIBRAMYCIN) 100 MG capsule;  Take  1 tablet  2 x /day with Meals  for Infection   Disp: 20  capsule; Refill: 1    Follow Up Instructions:      I discussed the assessment and treatment plan with the patient.  Diet discussed. The patient was provided an opportunity to ask questions and all were answered. The patient agreed with the plan and demonstrated an understanding of the instructions.      The patient was advised to call back or seek an in-person evaluation if the symptoms worsen or if the condition fails to improve as anticipated.   Kirtland Bouchard, MD

## 2021-04-06 ENCOUNTER — Other Ambulatory Visit: Payer: Self-pay

## 2021-04-06 ENCOUNTER — Ambulatory Visit (INDEPENDENT_AMBULATORY_CARE_PROVIDER_SITE_OTHER): Payer: Medicare Other | Admitting: Internal Medicine

## 2021-04-06 VITALS — BP 126/74 | HR 66 | Temp 98.4°F | Resp 16 | Ht 73.3 in | Wt 239.2 lb

## 2021-04-06 DIAGNOSIS — K5792 Diverticulitis of intestine, part unspecified, without perforation or abscess without bleeding: Secondary | ICD-10-CM

## 2021-04-06 LAB — CBC WITH DIFFERENTIAL/PLATELET
Absolute Monocytes: 703 cells/uL (ref 200–950)
Basophils Absolute: 30 cells/uL (ref 0–200)
Basophils Relative: 0.4 %
Eosinophils Absolute: 67 cells/uL (ref 15–500)
Eosinophils Relative: 0.9 %
HCT: 43.6 % (ref 38.5–50.0)
Hemoglobin: 14.6 g/dL (ref 13.2–17.1)
Lymphs Abs: 1147 cells/uL (ref 850–3900)
MCH: 31.5 pg (ref 27.0–33.0)
MCHC: 33.5 g/dL (ref 32.0–36.0)
MCV: 94.2 fL (ref 80.0–100.0)
MPV: 11.7 fL (ref 7.5–12.5)
Monocytes Relative: 9.5 %
Neutro Abs: 5454 cells/uL (ref 1500–7800)
Neutrophils Relative %: 73.7 %
Platelets: 207 10*3/uL (ref 140–400)
RBC: 4.63 10*6/uL (ref 4.20–5.80)
RDW: 12.6 % (ref 11.0–15.0)
Total Lymphocyte: 15.5 %
WBC: 7.4 10*3/uL (ref 3.8–10.8)

## 2021-04-06 MED ORDER — DOXYCYCLINE HYCLATE 100 MG PO CAPS: ORAL_CAPSULE | ORAL | 1 refills | Status: DC

## 2021-04-06 MED ORDER — CIPROFLOXACIN HCL 500 MG PO TABS: ORAL_TABLET | ORAL | 1 refills | Status: DC

## 2021-04-07 NOTE — Progress Notes (Signed)
============================================================ -   Test results slightly outside the reference range are not unusual. If there is anything important, I will review this with you,  otherwise it is considered normal test values.  If you have further questions,  please do not hesitate to contact me at the office or via My Chart.  ============================================================ ============================================================  -  CBC is OK - WBC is Normal suggesting not a severe or serious infection   ============================================================ ============================================================

## 2021-04-10 ENCOUNTER — Encounter: Payer: Self-pay | Admitting: Internal Medicine

## 2021-04-22 ENCOUNTER — Other Ambulatory Visit: Payer: Self-pay | Admitting: Internal Medicine

## 2021-05-12 ENCOUNTER — Encounter: Payer: BC Managed Care – PPO | Admitting: Physician Assistant

## 2021-05-17 ENCOUNTER — Ambulatory Visit: Payer: Medicare Other | Admitting: Physician Assistant

## 2021-05-20 ENCOUNTER — Ambulatory Visit: Payer: Medicare Other | Admitting: Adult Health

## 2021-06-08 NOTE — Progress Notes (Deleted)
MEDICARE ANNUAL WELLNESS VISIT AND FOLLOW UP Assessment:    Over 30 minutes of exam, counseling, chart review, and critical decision making was performed  Future Appointments  Date Time Provider [place]  06/09/2021  2:30 PM Liane Comber, NP [facility] None  08/18/2021  2:00 PM Liane Comber, NP [facility] None     Plan:   During the course of the visit the patient was educated and counseled about appropriate screening and preventive services including:   Pneumococcal vaccine  Influenza vaccine Prevnar 13 Td vaccine Screening electrocardiogram Colorectal cancer screening Diabetes screening Glaucoma screening Nutrition counseling    Subjective:  Anthony Paul is a 65 y.o. male who presents for Medicare Annual Wellness Visit and 3 month follow up for HTN, hyperlipidemia, prediabetes***, and vitamin D Def.   His blood pressure {HAS HAS NOT:18834} been controlled at home, today their BP is   He {DOES_DOES JME:26834} workout. He denies chest pain, shortness of breath, dizziness.  He {ACTION; IS/IS HDQ:22297989} on cholesterol medication and denies myalgias. His cholesterol {ACTION; IS/IS NOT:21021397} at goal. The cholesterol last visit was:   Lab Results  Component Value Date   CHOL 152 03/02/2021   HDL 32 (L) 03/02/2021   LDLCALC 84 03/02/2021   TRIG 301 (H) 03/02/2021   CHOLHDL 4.8 03/02/2021   He {Has/has not:18111} been working on diet and exercise for ***prediabetes, and denies {Symptoms; diabetes w/o none:19199}. Last A1C in the office was:  Lab Results  Component Value Date   HGBA1C 5.3 11/25/2020   Last GFR Lab Results  Component Value Date   GFRNONAA 63 03/02/2021     Lab Results  Component Value Date   GFRAA 73 03/02/2021   Patient is on Vitamin D supplement.   Lab Results  Component Value Date   VD25OH 53 11/25/2020      Medication Review:    Current Outpatient Medications (Cardiovascular):    atorvastatin (LIPITOR) 10 MG tablet,  TAKE 1 TABLET BY MOUTH EVERY DAY   Current Outpatient Medications (Respiratory):    albuterol (VENTOLIN HFA) 108 (90 Base) MCG/ACT inhaler, Inhale 2 puffs into the lungs every 4 (four) hours as needed for wheezing or shortness of breath. Please give generic or the one that insurance covers   Budeson-Glycopyrrol-Formoterol (BREZTRI AEROSPHERE) 160-9-4.8 MCG/ACT AERO, Inhale 2 puffs into the lungs in the morning and at bedtime. (Patient not taking: Reported on 04/06/2021)  Current Facility-Administered Medications (Respiratory):    ipratropium-albuterol (DUONEB) 0.5-2.5 (3) MG/3ML nebulizer solution 3 mL  Current Outpatient Medications (Analgesics):    acetaminophen (TYLENOL) 500 MG tablet, Take 1,500 mg by mouth every 6 (six) hours as needed for mild pain.   aspirin 81 MG tablet, Take 81 mg by mouth daily.   oxyCODONE-acetaminophen (PERCOCET) 10-325 MG tablet, Take 1 tablet by mouth as needed.   Current Outpatient Medications (Hematological):    Cyanocobalamin (VITAMIN B12 PO), Take by mouth daily.   Current Outpatient Medications (Other):    Cholecalciferol (VITAMIN D) 2000 UNITS CAPS, Take 1 capsule by mouth daily. Takes 500 mg   ciprofloxacin (CIPRO) 500 MG tablet, Take  1 tablet  2 x /day  with Meals for Infection   doxycycline (VIBRAMYCIN) 100 MG capsule, Take  1 tablet  2 x /day with Meals  for Infection   finasteride (PROSCAR) 5 MG tablet, TAKE 1 TABLET DAILY FOR PROSTATE   Flaxseed, Linseed, (FLAXSEED OIL) 1200 MG CAPS, Take 1 capsule by mouth daily.   MOVANTIK 25 MG TABS tablet, TAKE 1 TABLET  DAILY FOR REGULAR BM'S   NARCAN 4 MG/0.1ML LIQD nasal spray kit, SMARTSIG:1 Spray(s) Both Nares PRN   Omega-3 Fatty Acids (FISH OIL PO), Take 1 tablet by mouth daily.   traZODone (DESYREL) 150 MG tablet, TAKE 1/2 TO 1 TABLET 1 HOUR BEFORE BEDTIME FOR SLEEP   vitamin C (ASCORBIC ACID) 250 MG tablet, Take 250 mg by mouth daily.  Current Facility-Administered Medications (Other):    0.9 %   sodium chloride infusion  Allergies: Allergies  Allergen Reactions   Morphine Itching    Can take hydrocodone without issues    Current Problems (verified) has COPD GOLD 0 ; Bicuspid aortic valve; Mixed hyperlipidemia; Borderline systolic HTN; ED (erectile dysfunction); DJD (degenerative joint disease); Abnormal glucose; Vitamin D deficiency; Medication management; Chest pain, atypical most c/w ibs ; Cigarette smoker; Chronic left shoulder pain; Congenital insufficiency of aortic valve; and Aortic atherosclerosis ([place]) on their problem list.  Screening Tests Immunization History  Administered Date(s) Administered   Influenza Inj Mdck Quad With Preservative 10/24/2017, 10/31/2018   Influenza Split 10/05/2015   Influenza, High Dose Seasonal PF 11/25/2020   Influenza,Quad,Nasal, Live 10/05/2015   Influenza,inj,Quad PF,6+ Mos 11/15/2013, 10/16/2019   Influenza,inj,quad, With Preservative 09/26/2016   Influenza-Unspecified 12/19/2009   PFIZER(Purple Top)SARS-COV-2 Vaccination 02/10/2020, 03/02/2020   PPD Test 03/09/2015, 03/14/2016   Pneumococcal Conjugate-13 11/15/2013   Pneumococcal Polysaccharide-23 05/07/2020   Pneumococcal-Unspecified 12/20/1995   Td 12/19/2004   Tdap 03/09/2015    Preventative care: Last colonoscopy: ***  Prior vaccinations: TD or Tdap: ***  Influenza: ***  Pneumococcal: *** Prevnar13:  Shingles/Zostavax: ***  Names of Other Physician/Practitioners you currently use: 1. [facility] here for primary care 2. ***, eye doctor, last visit  3. ***, dentist, last visit  Patient Care Team: Unk Pinto, MD as PCP - General (Internal Medicine) Josue Hector, MD as PCP - Cardiology (Cardiology)  Surgical: He  has a past surgical history that includes Colon surgery (Jan 2012); Tonsillectomy; Hemorroidectomy; Colonoscopy (01/13/2014); Upper gi endoscopy (2015); Inguinal hernia repair (Right, 2015); Resection distal  clavical (Right); and Upper gastrointestinal endoscopy. Family His family history includes Benign prostatic hyperplasia in his father; Cancer in his sister; Diabetes in his mother; Heart disease in his father; Kidney disease in his sister. Social history  He reports that he has been smoking cigarettes. He started smoking about 49 years ago. He has a 70.50 pack-year smoking history. He quit smokeless tobacco use about 14 years ago.  His smokeless tobacco use included chew. He reports current alcohol use of about 5.0 standard drinks of alcohol per week. He reports that he does not use drugs.  MEDICARE WELLNESS OBJECTIVES: Physical activity:   Cardiac risk factors:   Depression/mood screen:   Depression screen [facility] 2/9 11/25/2020  Decreased Interest 0  Down, Depressed, Hopeless 0  PHQ - 2 Score 0    ADLs:  In your present state of health, do you have any difficulty performing the following activities: 11/25/2020  Hearing? N  Vision? N  Difficulty concentrating or making decisions? N  Walking or climbing stairs? N  Dressing or bathing? N  Doing errands, shopping? N  Some recent data might be hidden     Cognitive Testing  Alert? Yes  Normal Appearance?Yes  Oriented to person? Yes  Place? Yes   Time? Yes  Recall of three objects?  Yes  Can perform simple calculations? Yes  Displays appropriate judgment?Yes  Can read the correct time from a watch face?Yes  EOL  planning:     Objective:   There were no vitals filed for this visit. There is no height or weight on file to calculate BMI.  General appearance: alert, no distress, WD/WN, male HEENT: normocephalic, sclerae anicteric, TMs pearly, nares patent, no discharge or erythema, pharynx normal Oral cavity: MMM, no lesions Neck: supple, no lymphadenopathy, no thyromegaly, no masses Heart: RRR, normal S1, S2, no murmurs Lungs: CTA bilaterally, no wheezes, rhonchi, or rales Abdomen: +bs, soft, non tender, non distended, no masses, no  hepatomegaly, no splenomegaly Musculoskeletal: nontender, no swelling, no obvious deformity Extremities: no edema, no cyanosis, no clubbing Pulses: 2+ symmetric, upper and lower extremities, normal cap refill Neurological: alert, oriented x 3, CN2-12 intact, strength normal upper extremities and lower extremities, sensation normal throughout, DTRs 2+ throughout, no cerebellar signs, gait normal Psychiatric: normal affect, behavior normal, pleasant   Medicare Attestation I have personally reviewed: The patient's medical and social history Their use of alcohol, tobacco or illicit drugs Their current medications and supplements The patient's functional ability including ADLs,fall risks, home safety risks, cognitive, and hearing and visual impairment Diet and physical activities Evidence for depression or mood disorders  The patient's weight, height, BMI, and visual acuity have been recorded in the chart.  I have made referrals, counseling, and provided education to the patient based on review of the above and I have provided the patient with a written personalized care plan for preventive services.     Magda Bernheim, NP   06/08/2021

## 2021-06-09 ENCOUNTER — Encounter: Payer: Self-pay | Admitting: Adult Health

## 2021-06-09 ENCOUNTER — Other Ambulatory Visit: Payer: Self-pay

## 2021-06-09 ENCOUNTER — Ambulatory Visit (INDEPENDENT_AMBULATORY_CARE_PROVIDER_SITE_OTHER): Payer: Medicare Other | Admitting: Adult Health

## 2021-06-09 VITALS — BP 118/80 | HR 83 | Temp 97.2°F | Wt 234.0 lb

## 2021-06-09 DIAGNOSIS — E782 Mixed hyperlipidemia: Secondary | ICD-10-CM | POA: Diagnosis not present

## 2021-06-09 DIAGNOSIS — I700 Atherosclerosis of aorta: Secondary | ICD-10-CM

## 2021-06-09 DIAGNOSIS — Z0001 Encounter for general adult medical examination with abnormal findings: Secondary | ICD-10-CM

## 2021-06-09 DIAGNOSIS — R030 Elevated blood-pressure reading, without diagnosis of hypertension: Secondary | ICD-10-CM

## 2021-06-09 DIAGNOSIS — R6889 Other general symptoms and signs: Secondary | ICD-10-CM | POA: Diagnosis not present

## 2021-06-09 DIAGNOSIS — G4700 Insomnia, unspecified: Secondary | ICD-10-CM

## 2021-06-09 DIAGNOSIS — M1990 Unspecified osteoarthritis, unspecified site: Secondary | ICD-10-CM

## 2021-06-09 DIAGNOSIS — Z9889 Other specified postprocedural states: Secondary | ICD-10-CM

## 2021-06-09 DIAGNOSIS — Z79899 Other long term (current) drug therapy: Secondary | ICD-10-CM

## 2021-06-09 DIAGNOSIS — J449 Chronic obstructive pulmonary disease, unspecified: Secondary | ICD-10-CM

## 2021-06-09 DIAGNOSIS — M25512 Pain in left shoulder: Secondary | ICD-10-CM

## 2021-06-09 DIAGNOSIS — Q231 Congenital insufficiency of aortic valve: Secondary | ICD-10-CM

## 2021-06-09 DIAGNOSIS — I77810 Thoracic aortic ectasia: Secondary | ICD-10-CM

## 2021-06-09 DIAGNOSIS — N400 Enlarged prostate without lower urinary tract symptoms: Secondary | ICD-10-CM

## 2021-06-09 DIAGNOSIS — E559 Vitamin D deficiency, unspecified: Secondary | ICD-10-CM

## 2021-06-09 DIAGNOSIS — N529 Male erectile dysfunction, unspecified: Secondary | ICD-10-CM

## 2021-06-09 DIAGNOSIS — F17210 Nicotine dependence, cigarettes, uncomplicated: Secondary | ICD-10-CM

## 2021-06-09 DIAGNOSIS — G8929 Other chronic pain: Secondary | ICD-10-CM

## 2021-06-09 DIAGNOSIS — R7309 Other abnormal glucose: Secondary | ICD-10-CM

## 2021-06-09 DIAGNOSIS — Z86010 Personal history of colonic polyps: Secondary | ICD-10-CM | POA: Insufficient documentation

## 2021-06-09 DIAGNOSIS — Z0000 Encounter for general adult medical examination without abnormal findings: Secondary | ICD-10-CM

## 2021-06-09 NOTE — Progress Notes (Signed)
MEDICARE ANNUAL WELLNESS VISIT AND FOLLOW UP Assessment:   Anthony Paul was seen today for medicare wellness and follow-up.  Diagnoses and all orders for this visit:  Encounter for Medicare annual wellness exam Due annually   Aortic atherosclerosis ([place]) - per CT 09/2020 Control blood pressure, cholesterol, glucose, increase exercise.  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  Encourage smoking cessation Attention on annual low dose screening exams  Mixed hyperlipidemia Titrate statin for LDL goal <70 discussed due to smoking hx and risk Continue low cholesterol diet and exercise.  Check lipid panel.  -     Lipid panel -     TSH  Borderline systolic HTN Doing well with lifestyle off of meds Monitor blood pressure at home; call if consistently over 130/80 Continue DASH diet.   Reminder to go to the ER if any CP, SOB, nausea, dizziness, severe HA, changes vision/speech, left arm numbness and tingling and jaw pain. -     CBC with Differential/Platelet -     COMPLETE METABOLIC PANEL WITH GFR -     Magnesium  Bicuspid aortic valve Dr. Johnsie Cancel follows; denies concerning sx  COPD GOLD 0  COPE per imaging, PFTs were ok Does well with inhaler PRN with respiratory infections Smoking cessation encouraged  Cigarette smoker Discussed risks associated with tobacco use and advised to reduce or quit Patient is ready to do so and plans to taper slowly, resources given Declines Chantix or other medication Will follow up at the next visit  -lung cancer screening with low dose CT discussed as recommended by guidelines based on age, number of pack year history.  Discussed risks of screening including but not limited to false positives on xray, further testing or consultation with specialist, and possible false negative CT as well. Understanding expressed and wishes to proceed with CT testing. Order placed.     Vitamin D deficiency Continue supplement  Abnormal glucose Recent A1Cs at goal Discussed diet/exercise, weight management  Defer A1C; check CMP -     COMPLETE METABOLIC PANEL WITH GFR  Medication management -     CBC with Differential/Platelet -     COMPLETE METABOLIC PANEL WITH GFR -     Magnesium  Erectile dysfunction, unspecified erectile dysfunction type Declines meds  History of adenomatous polyp of colon Increase fiber; colonoscopy UTD  Osteoarthritis, unspecified osteoarthritis type, unspecified site Ortho/pain management  Chronic left shoulder pain Ortho/pain management  Insomnia, unspecified type Trazodone working well   Benign prostatic hyperplasia without lower urinary tract symptoms Continue finasteride  Chronic RLQ pain/ History of right inguinal hernia repair Has had fairly extensive workup without clear etiology, hx of hernia repair, after discussion will refer back for Dr. Dois Davenport input -     Ambulatory referral to General Surgery  Over 30 minutes of exam, counseling, chart review, and critical decision making was performed  Future Appointments  Date Time Provider [place]  09/23/2021  2:00 PM Liane Comber, NP [facility] None  06/09/2022  2:30 PM Liane Comber, NP [facility] None     Plan:   During the course of the visit the patient was educated and counseled about appropriate screening and preventive services including:   Pneumococcal vaccine  Influenza vaccine Prevnar 13 Td vaccine Screening electrocardiogram Colorectal cancer screening Diabetes screening Glaucoma screening Nutrition counseling    Subjective:  Anthony Paul is a 65 y.o. male who presents for Medicare Annual  Wellness Visit and 3 month follow up for HTN, hyperlipidemia, glucose management and vitamin D Def.   He sees pain management for bilateral rib pain, some flank pain. Has tried lyrica/gabpentin without help, no help with Cymbalta. Had normal AB  [place] 11/2019, and GI visit, colonoscopy in 2021 and CT 02/2019 at [facility]. Had tried trigger points, failed the trial of the spinal stimulator.  Now on oxycodone by pain management.   Denies noable GI sx other than constipation (onset after oxycodone, well managed by [facility]). Also RLQ pain/tenderness persistent for 2+ years, worse with lifting or bearing down, hx of R inguinal hernia repair by Dr. Redmond Pulling in 2015 and interested in discussing with him.   He continues to smoke, started age 17, smoked a pack a day from 74 yr to 66 years old 40+ pack year history, but states he has decreased to less than a pack, COPD 0 per Dr. Melvyn Novas in 2019, is not on an maintenance inhaler.  He wants to quit smoking, he is going to pick a stop date.  Also talked about taper stop as well.  He has tried chantix in the past and did not tolerate this well.   He has a bicuspid aortic valve, follows with cardiology Dr. Johnsie Cancel, Echo 06/2020- EF normal mild AS and mild to moderate AR stable gradients- mild aortic root dilitation and stress test normal 09/2016.  He takes trazodone 150 mg night with modest benefit for insomnia.   BMI is Body mass index is 30.62 kg/m., he has not been working on diet and exercise. Wt Readings from Last 3 Encounters:  06/09/21 234 lb (106.1 kg)  04/06/21 239 lb 3.2 oz (108.5 kg)  03/02/21 239 lb (108.4 kg)   His blood pressure has been controlled at home, today their BP is BP: 118/80 He does not workout. He denies chest pain, shortness of breath, dizziness.   He has aortic atherosclerosis and ectaic aora per CT 09/2020.   He is on cholesterol medication (atorvastatin 10 mg daily) and denies myalgias. His cholesterol is not at goal. The cholesterol last visit was:   Lab Results  Component Value Date   CHOL 152 03/02/2021   HDL 32 (L) 03/02/2021   LDLCALC 84 03/02/2021   TRIG 301 (H) 03/02/2021   CHOLHDL 4.8 03/02/2021   He has not been working on diet and exercise for glucose managment,  and denies nausea, paresthesia of the feet, polydipsia, polyuria, and visual disturbances. Last A1C in the office was:  Lab Results  Component Value Date   HGBA1C 5.3 11/25/2020   Last GFR Lab Results  Component Value Date   GFRNONAA 63 03/02/2021   Patient is on Vitamin D supplement, taking 2000 IU daily  Lab Results  Component Value Date   VD25OH 53 11/25/2020     He is on finasteride 5 mg for BPH with weak stream has since resolved. He has ED but declined meds. Last PSA was: Lab Results  Component Value Date   PSA 0.2 08/12/2020      Medication Review:    Current Outpatient Medications (Cardiovascular):    atorvastatin (LIPITOR) 10 MG tablet, TAKE 1 TABLET BY MOUTH EVERY DAY   Current Outpatient Medications (Respiratory):    albuterol (VENTOLIN HFA) 108 (90 Base) MCG/ACT inhaler, Inhale 2 puffs into the lungs every 4 (four) hours as needed for wheezing or shortness of breath. Please give generic or the one that insurance covers (Patient not taking: Reported on 06/09/2021)   Budeson-Glycopyrrol-Formoterol (BREZTRI  AEROSPHERE) 160-9-4.8 MCG/ACT AERO, Inhale 2 puffs into the lungs in the morning and at bedtime. (Patient not taking: No sig reported)  Current Facility-Administered Medications (Respiratory):    ipratropium-albuterol (DUONEB) 0.5-2.5 (3) MG/3ML nebulizer solution 3 mL  Current Outpatient Medications (Analgesics):    acetaminophen (TYLENOL) 500 MG tablet, Take 1,500 mg by mouth every 6 (six) hours as needed for mild pain.   aspirin 81 MG tablet, Take 81 mg by mouth daily.   oxyCODONE-acetaminophen (PERCOCET) 10-325 MG tablet, Take 1 tablet by mouth as needed.   Current Outpatient Medications (Hematological):    Cyanocobalamin (VITAMIN B12 PO), Take by mouth daily.   Current Outpatient Medications (Other):    Cholecalciferol (VITAMIN D) 2000 UNITS CAPS, Take 1 capsule by mouth daily. Takes 500 mg   finasteride (PROSCAR) 5 MG tablet, TAKE 1 TABLET DAILY FOR  PROSTATE   Flaxseed, Linseed, (FLAXSEED OIL) 1200 MG CAPS, Take 1 capsule by mouth daily.   MOVANTIK 25 MG TABS tablet, TAKE 1 TABLET DAILY FOR REGULAR BM'S   NARCAN 4 MG/0.1ML LIQD nasal spray kit, SMARTSIG:1 Spray(s) Both Nares PRN   Omega-3 Fatty Acids (FISH OIL PO), Take 1 tablet by mouth daily.   traZODone (DESYREL) 150 MG tablet, TAKE 1/2 TO 1 TABLET 1 HOUR BEFORE BEDTIME FOR SLEEP   vitamin C (ASCORBIC ACID) 250 MG tablet, Take 250 mg by mouth daily.  Current Facility-Administered Medications (Other):    0.9 %  sodium chloride infusion  Allergies: Allergies  Allergen Reactions   Morphine Itching    Can take hydrocodone without issues    Current Problems (verified) has COPD GOLD 0 ; Bicuspid aortic valve; Mixed hyperlipidemia; Borderline systolic HTN; ED (erectile dysfunction); DJD (degenerative joint disease); Abnormal glucose; Vitamin D deficiency; Medication management; Chest pain, atypical most c/w ibs ; Cigarette smoker (70+ pack year current smoker); Chronic left shoulder pain; Congenital insufficiency of aortic valve; Aortic atherosclerosis ([place]) - CT 09/2020; History of adenomatous polyp of colon; Ectatic thoracic aorta ([place]) - CT 09/2020; Insomnia; and BPH (benign prostatic hyperplasia) on their problem list.  Screening Tests Immunization History  Administered Date(s) Administered   Influenza Inj Mdck Quad With Preservative 10/24/2017, 10/31/2018   Influenza Split 10/05/2015   Influenza, High Dose Seasonal PF 11/25/2020   Influenza,Quad,Nasal, Live 10/05/2015   Influenza,inj,Quad PF,6+ Mos 11/15/2013, 10/16/2019   Influenza,inj,quad, With Preservative 09/26/2016   Influenza-Unspecified 12/19/2009   PFIZER(Purple Top)SARS-COV-2 Vaccination 02/10/2020, 03/02/2020   PPD Test 03/09/2015, 03/14/2016   Pneumococcal Conjugate-13 11/15/2013   Pneumococcal Polysaccharide-23 05/07/2020   Pneumococcal-Unspecified 12/20/1995   Td 12/19/2004   Tdap 03/09/2015    Preventative  care: Last colonoscopy: 12/2019, Dr. Havery Moros, 1 polyp, hx of precancerous polyps, 5 year follow up  CT chest: 09/2020 annual follow   Prior vaccinations: TD or Tdap: 2016  Influenza: 2021 Pneumococcal: 2021 Prevnar13: 2014 Shingles/Zostavax: will check about shingrix  Covid 19: 2/2 pfizer + booster - information requested  Names of Other Physician/Practitioners you currently use: 1.  [facility] here for primary care 2. [facility], eye doctor, last visit 2022 3. Dr. Lanny Hurst, dentist, last visit 2022, q70m Patient Care Team: MUnk Pinto MD as PCP - General (Internal Medicine) NJosue Hector MD as PCP - Cardiology (Cardiology)  Surgical: He  has a past surgical history that includes Colon surgery (Jan 2012); Tonsillectomy; Hemorroidectomy; Colonoscopy (01/13/2014); Upper gi endoscopy (2015); Inguinal hernia repair (Right, 2015); Resection distal clavical (Right); and Upper gastrointestinal endoscopy. Family His family history includes Benign prostatic  hyperplasia in his father; Cancer in his sister; Diabetes in his mother; Heart disease in his father; Kidney disease in his sister. Social history  He reports that he has been smoking cigarettes. He started smoking about 49 years ago. He has a 70.50 pack-year smoking history. He quit smokeless tobacco use about 14 years ago.  His smokeless tobacco use included chew. He reports current alcohol use of about 5.0 standard drinks of alcohol per week. He reports that he does not use drugs.  MEDICARE WELLNESS OBJECTIVES: Physical activity: Current Exercise Habits: The patient does not participate in regular exercise at present, Exercise limited by: None identified Cardiac risk factors: Cardiac Risk Factors include: advanced age (>38mn, >>81women);dyslipidemia;hypertension;male gender;obesity (BMI >30kg/m2);smoking/ tobacco exposure;sedentary lifestyle Depression/mood screen:   [facility]/09[facility]  Decreased Interest 0  Down, Depressed, Hopeless 0  PHQ - 2 Score 0    ADLs:  In your present state of health, do you have any difficulty performing the following activities: 06/09/2021 11/25/2020  Hearing? N N  Vision? N N  Difficulty concentrating or making decisions? N N  Walking or climbing stairs? N N  Dressing or bathing? N N  Doing errands, shopping? N N  Some recent data might be hidden     Cognitive Testing  Alert? Yes  Normal Appearance?Yes  Oriented to person? Yes  Place? Yes   Time? Yes  Recall of three objects?  Yes  Can perform simple calculations? Yes  Displays appropriate judgment?Yes  Can read the correct time from a watch face?Yes  EOL planning: Does Patient Have a Medical Advance Directive?: Yes Type of Advance Directive: Healthcare Power of Attorney, Living will Does patient want to make changes to medical advance directive?: No - Patient declined Copy of [place] Chart?: No - copy requested   Review of Systems  Constitutional:  Negative for malaise/fatigue and weight loss.  HENT:  Negative for hearing loss and tinnitus.   Eyes:  Negative for blurred vision and double vision.  Respiratory:  Negative for cough, sputum production, shortness of breath and wheezing.   Cardiovascular:  Negative for chest pain, palpitations, orthopnea, claudication, leg swelling and PND.  Gastrointestinal:  Positive for abdominal pain (chronic flank and RLQ). Negative for blood in stool, constipation (Improved with med, ongoing with oxycodone), diarrhea, heartburn, melena, nausea and vomiting.  Genitourinary: Negative.   Musculoskeletal:  Negative for falls, joint pain and myalgias.  Skin:  Negative for rash.  Neurological:  Negative for dizziness, tingling, sensory change, weakness and headaches.  Endo/Heme/Allergies:  Negative for polydipsia.  Psychiatric/Behavioral: Negative.  Negative for depression, memory loss, substance abuse and  suicidal ideas. The patient is not nervous/anxious and does not have insomnia.   All other systems reviewed and are negative.    Objective:   Today's Vitals   06/09/21 1424  BP: 118/80  Pulse: 83  Temp: (!) 97.2 F (36.2 C)  SpO2: 96%  Weight: 234 lb (106.1 kg)   Body mass index is 30.62 kg/m.  General appearance: alert, no distress, WD/WN, male HEENT: normocephalic, sclerae anicteric, TMs pearly, nares patent, no discharge or erythema, pharynx normal Oral cavity: MMM, no lesions Neck: supple, no lymphadenopathy, no thyromegaly, no masses Heart: RRR, normal S1, S2, no murmurs Lungs: CTA bilaterally, no wheezes, rhonchi, or rales Abdomen: +bs, soft, RLQ tenderness with subtle localized swelling without clear hearnia, non-erythematous,non distended, no masses, no hepatomegaly, no splenomegaly Musculoskeletal: nontender, no swelling, no obvious deformity Extremities: no edema, no  cyanosis, no clubbing Pulses: 2+ symmetric, upper and lower extremities, normal cap refill Neurological: alert, oriented x 3, CN2-12 intact, strength normal upper extremities and lower extremities, sensation normal throughout, DTRs 2+ throughout, no cerebellar signs, gait normal Psychiatric: normal affect, behavior normal, pleasant    Medicare Attestation I have personally reviewed: The patient's medical and social history Their use of alcohol, tobacco or illicit drugs Their current medications and supplements The patient's functional ability including ADLs,fall risks, home safety risks, cognitive, and hearing and visual impairment Diet and physical activities Evidence for depression or mood disorders  The patient's weight, height, BMI, and visual acuity have been recorded in the chart.  I have made referrals, counseling, and provided education to the patient based on review of the above and I have provided the patient with a written personalized care plan for preventive services.     Izora Ribas, NP   06/09/2021

## 2021-06-09 NOTE — Patient Instructions (Addendum)
  Please send covid 19 booster information   Please bring advanced directive and living will     [employer] with no obligation # [phone] Do not have to be a member Tues-Sat 10-6  [place]- free test with no obligation # [phone] MUST BE A MEMBER Call for store hours  Have had patient's get good cheaper hearing aids from [employer] The air version has good reviews.      SMOKING CESSATION  [employer]  [phone] for more information or for a free program for smoking cessation help.   You can call QUIT SMART 1-800-QUIT-NOW for free nicotine patches or replacement therapy- if they are out- keep calling  [facility] Can call for smoking cessation classes, [phone]  If you have a smart phone, please look up Smoke Free app, this will help you stay on track and give you information about money you have saved, life that you have gained back and a ton of more information.     ADVANTAGES OF QUITTING SMOKING Within 20 minutes, blood pressure decreases. Your pulse is at normal level. After 8 hours, carbon monoxide levels in the blood return to normal. Your oxygen level increases. After 24 hours, the chance of having a heart attack starts to decrease. Your breath, hair, and body stop smelling like smoke. After 48 hours, damaged nerve endings begin to recover. Your sense of taste and smell improve. After 72 hours, the body is virtually free of nicotine. Your bronchial tubes relax and breathing becomes easier. After 2 to 12 weeks, lungs can hold more air. Exercise becomes easier and circulation improves. After 1 year, the risk of coronary heart disease is cut in half. After 5 years, the risk of stroke falls to the same as a nonsmoker. After 10 years, the risk of lung cancer is cut in half and the risk of other cancers decreases significantly. After 15 years, the risk of coronary heart disease drops, usually to the  level of a nonsmoker. You will have extra money to spend on things other than cigarettes.

## 2021-06-10 ENCOUNTER — Other Ambulatory Visit: Payer: Self-pay | Admitting: Adult Health

## 2021-06-10 LAB — COMPLETE METABOLIC PANEL WITH GFR
AG Ratio: 1.8 (calc) (ref 1.0–2.5)
ALT: 22 U/L (ref 9–46)
AST: 17 U/L (ref 10–35)
Albumin: 4.2 g/dL (ref 3.6–5.1)
Alkaline phosphatase (APISO): 58 U/L (ref 35–144)
BUN: 14 mg/dL (ref 7–25)
CO2: 25 mmol/L (ref 20–32)
Calcium: 9.5 mg/dL (ref 8.6–10.3)
Chloride: 104 mmol/L (ref 98–110)
Creat: 1.13 mg/dL (ref 0.70–1.25)
GFR, Est African American: 78 mL/min/{1.73_m2} (ref >=60)
GFR, Est Non African American: 67 mL/min/{1.73_m2} (ref >=60)
Globulin: 2.3 g/dL (calc) (ref 1.9–3.7)
Glucose, Bld: 97 mg/dL (ref 65–99)
Potassium: 4.8 mmol/L (ref 3.5–5.3)
Sodium: 138 mmol/L (ref 135–146)
Total Bilirubin: 0.5 mg/dL (ref 0.2–1.2)
Total Protein: 6.5 g/dL (ref 6.1–8.1)

## 2021-06-10 LAB — CBC WITH DIFFERENTIAL/PLATELET
Absolute Monocytes: 813 cells/uL (ref 200–950)
Basophils Absolute: 30 cells/uL (ref 0–200)
Basophils Relative: 0.4 %
Eosinophils Absolute: 99 cells/uL (ref 15–500)
Eosinophils Relative: 1.3 %
HCT: 46.2 % (ref 38.5–50.0)
Hemoglobin: 15.5 g/dL (ref 13.2–17.1)
Lymphs Abs: 1345 cells/uL (ref 850–3900)
MCH: 31.3 pg (ref 27.0–33.0)
MCHC: 33.5 g/dL (ref 32.0–36.0)
MCV: 93.1 fL (ref 80.0–100.0)
MPV: 11.3 fL (ref 7.5–12.5)
Monocytes Relative: 10.7 %
Neutro Abs: 5312 cells/uL (ref 1500–7800)
Neutrophils Relative %: 69.9 %
Platelets: 204 10*3/uL (ref 140–400)
RBC: 4.96 10*6/uL (ref 4.20–5.80)
RDW: 13 % (ref 11.0–15.0)
Total Lymphocyte: 17.7 %
WBC: 7.6 10*3/uL (ref 3.8–10.8)

## 2021-06-10 LAB — LIPID PANEL
Cholesterol: 169 mg/dL (ref <200)
HDL: 31 mg/dL — ABNORMAL LOW (ref >=40)
LDL Cholesterol (Calc): 97 mg/dL (calc)
Non-HDL Cholesterol (Calc): 138 mg/dL (calc) — ABNORMAL HIGH (ref <130)
Total CHOL/HDL Ratio: 5.5 (calc) — ABNORMAL HIGH (ref <5.0)
Triglycerides: 306 mg/dL — ABNORMAL HIGH (ref <150)

## 2021-06-10 LAB — MAGNESIUM: Magnesium: 2.2 mg/dL (ref 1.5–2.5)

## 2021-06-10 LAB — TSH: TSH: 1.9 mIU/L (ref 0.40–4.50)

## 2021-06-10 MED ORDER — ATORVASTATIN CALCIUM 20 MG PO TABS: ORAL_TABLET | ORAL | 3 refills | Status: DC

## 2021-07-12 NOTE — Telephone Encounter (Signed)
Dr. Randall Hiss [name]'s office has this chart in review, they will call patient to schedule. Patient notified of status.

## 2021-07-14 ENCOUNTER — Ambulatory Visit: Payer: Medicare Other

## 2021-08-18 ENCOUNTER — Encounter: Payer: Medicare Other | Admitting: Adult Health

## 2021-08-31 ENCOUNTER — Other Ambulatory Visit: Payer: Self-pay | Admitting: Adult Health Nurse Practitioner

## 2021-09-23 ENCOUNTER — Encounter: Payer: Medicare Other | Admitting: Adult Health

## 2021-09-28 NOTE — Progress Notes (Signed)
CPE Assessment and Plan:  Anthony Paul was seen today for annual exam.  Diagnoses and all orders for this visit:  Encounter for general adult medical examination with abnormal findings  Due Yearly  Aortic atherosclerosis ([place])  Control blood pressure, cholesterol, glucose, increase exercise.  -     Lipid panel  Ectatic thoracic aorta (HCC)  Control blood pressure, cholesterol, glucose, increase exercise.  Encourage smoking cessation Attention on annual low dose screening exams  Pulmonary emphysema, unspecified emphysema type (HCC)/COPD with exacerbation ([place])  COPE per imaging, PFTs were ok Does well with inhaler PRN with respiratory infections Smoking cessation encouraged Low Dose CT is scheduled for 10/27  Mixed hyperlipidemia  Titrate statin for LDL goal <70 discussed due to smoking hx and risk Continue low cholesterol diet and exercise.  -     COMPLETE METABOLIC PANEL WITH GFR -     Lipid panel -     TSH  Labile hypertension Doing well with lifestyle off of meds Monitor blood pressure at home; call if consistently over 130/80 Continue DASH diet.   Reminder to go to the ER if any CP, SOB, nausea, dizziness, severe HA, changes vision/speech, left arm numbness and tingling and jaw pain. -     CBC with Differential/Platelet  Abnormal glucose Recent A1Cs at goal Discussed diet/exercise, weight management  -     Hemoglobin A1c  Osteoarthritis, unspecified osteoarthritis type, unspecified site  Continue to follow with pain clinic  Cigarette smoker  Discussed risks associated with tobacco use and advised to reduce or quit Patient is ready to do so and plans to taper slowly, resources given. Not currently ready to quit Will follow up at the next visit  Bicuspid aortic valve  Dr. Johnsie Cancel follows; denies concerning sx  BMI 30.0-30.9,adult  Continue diet and exercise  Vitamin D deficiency -     VITAMIN D 25 Hydroxy (Vit-D Deficiency, Fractures) Continue  supplementation  Erectile dysfunction, unspecified erectile dysfunction type  Declines Meds  Medication management -     CBC with Differential/Platelet -     COMPLETE METABOLIC PANEL WITH GFR -     Lipid panel -     TSH -     Hemoglobin A1c -     VITAMIN D 25 Hydroxy (Vit-D Deficiency, Fractures) -     Magnesium -     EKG 12-Lead -     Urinalysis, Routine w reflex microscopic -     Microalbumin / creatinine urine ratio -     PSA  Screening for ischemic heart disease -     EKG 12-Lead  Screening for hematuria or proteinuria -     Urinalysis, Routine w reflex microscopic -     Microalbumin / creatinine urine ratio  Screening for thyroid disorder -     TSH  Benign prostatic hyperplasia without lower urinary tract symptoms -     PSA  Flu vaccine need -     Flu vaccine HIGH DOSE PF   Discussed med's effects and SE's. Screening labs and tests as requested with regular follow-up as recommended. Over 40 minutes of exam, counseling, chart review and critical decision making was performed Future Appointments  Date Time Provider [place]  10/14/2021 12:00 PM [facility]-315 CT 1 [facility] [facility]  01/05/2022  2:45 PM Josue Hector, MD [facility]  06/09/2022  2:30 PM Liane Comber, NP [facility] None  09/29/2022  2:00 PM Magda Bernheim, NP [facility] None   HPI Patient presents for a CPE  and follow up for HTN, chol.   He sees pain management for bilateral rib pain, tooth ache, some flank pain, no radiation to his back, states pain is getting worse, has tried lyrica/gabpentin without help,no help with Cymbalta.  Had tried trigger points, failed the trial of the spinal stimulator, has done inhalers for possible pleuritis that did not help.  Pain is better in the AM and worse in the evening.  No worsening pain with exercise or excretion, just helped daughter build deck and it did not make the pain worse. He is currently taking Percocet 10/325 daily 1 pill q 4  hours  Pt is having pain below inguinal hernia repair scar- saw Dr. Gladstone Lighter and he sent him for MRI of hips which he had yesterday. Has ongoing constipation from percocet but Movantik is helping.   He continues to smoke, started age 24, smoked a pack a day from 45 yr to 35 years old 40+ pack year history, but states he has decreased 5-8 cigs, is not on an inhaler. Has not symptoms at this time. He does notice shortness of breath on exertion, Believes he has it scheduled for this year Low dose CT lung CA screen 10/12/20 1. Lung-RADS 1S, negative. Continue annual screening with low-dose chest CT without contrast in 12 months. 2. Aortic atherosclerosis, with ectasia of ascending thoracic aorta (4.3 cm in diameter). Attention at time of repeat annual low-dose lung cancer screening chest CT is recommended to ensure continued stability. 3. Mild diffuse bronchial wall thickening with mild centrilobular and paraseptal emphysema; imaging findings suggestive of underlying COPD. 4. There are severe calcifications of the aortic valve. Echocardiographic correlation for evaluation of potential valvular dysfunction may be warranted if clinically indicated.  Aortic Atherosclerosis (ICD10-I70.0) and Emphysema (ICD10-J43.9).  His blood pressure has been controlled at home, today their BP is BP: 122/74  BP Readings from Last 3 Encounters:  09/29/21 122/74  06/09/21 118/80  04/06/21 126/74     He has a bicuspid aortic valve, follows with cardiology, has appointment in July.  Echo 06/2020- EF normal mild AS and mild to moderate AR stable gradients- mild aortic root dilitation and stress test normal 09/2016. He does workout. He denies chest pain, shortness of breath, dizziness.   BMI is Body mass index is 30.85 kg/m., he is working on diet and exercise. 2 weeks ago started riding bike Wt Readings from Last 3 Encounters:  09/29/21 233 lb 12.8 oz (106.1 kg)  06/09/21 234 lb (106.1 kg)  04/06/21 239 lb 3.2  oz (108.5 kg)   He is on cholesterol medication, atorvastatin 20 and denies myalgias. His cholesterol is not at goal less than 70. He is a smoker.  The cholesterol last visit was:   Lab Results  Component Value Date   CHOL 169 06/09/2021   HDL 31 (L) 06/09/2021   LDLCALC 97 06/09/2021   TRIG 306 (H) 06/09/2021   CHOLHDL 5.5 (H) 06/09/2021    Last A1C in the office was:  Lab Results  Component Value Date   HGBA1C 5.3 11/25/2020   Last GFR: Lab Results  Component Value Date   GFRNONAA 67 06/09/2021    Patient is on Vitamin D supplement.   Lab Results  Component Value Date   VD25OH 53 11/25/2020      Current Medications:     Current Outpatient Medications (Cardiovascular):    atorvastatin (LIPITOR) 20 MG tablet, Take 1 tab every night for cholesterol.   Current Outpatient Medications (Respiratory):  albuterol (VENTOLIN HFA) 108 (90 Base) MCG/ACT inhaler, Inhale 2 puffs into the lungs every 4 (four) hours as needed for wheezing or shortness of breath. Please give generic or the one that insurance covers (Patient not taking: No sig reported)   Budeson-Glycopyrrol-Formoterol (BREZTRI AEROSPHERE) 160-9-4.8 MCG/ACT AERO, Inhale 2 puffs into the lungs in the morning and at bedtime. (Patient not taking: No sig reported)  Current Facility-Administered Medications (Respiratory):    ipratropium-albuterol (DUONEB) 0.5-2.5 (3) MG/3ML nebulizer solution 3 mL  Current Outpatient Medications (Analgesics):    acetaminophen (TYLENOL) 500 MG tablet, Take 1,500 mg by mouth every 6 (six) hours as needed for mild pain.   aspirin 81 MG tablet, Take 81 mg by mouth daily.   oxyCODONE-acetaminophen (PERCOCET) 10-325 MG tablet, Take 1 tablet by mouth as needed.   Current Outpatient Medications (Hematological):    Cyanocobalamin (VITAMIN B12 PO), Take by mouth daily.   Current Outpatient Medications (Other):    Cholecalciferol (VITAMIN D) 2000 UNITS CAPS, Take 1 capsule by mouth daily. Takes  500 mg   finasteride (PROSCAR) 5 MG tablet, TAKE 1 TABLET DAILY FOR PROSTATE   Flaxseed, Linseed, (FLAXSEED OIL) 1200 MG CAPS, Take 1 capsule by mouth daily.   MOVANTIK 25 MG TABS tablet, TAKE 1 TABLET DAILY FOR REGULAR BM'S   Omega-3 Fatty Acids (FISH OIL PO), Take 1 tablet by mouth daily.   traZODone (DESYREL) 150 MG tablet, TAKE 1/2 TO 1 TABLET 1 HOUR BEFORE BEDTIME FOR SLEEP   vitamin C (ASCORBIC ACID) 250 MG tablet, Take 250 mg by mouth daily.   NARCAN 4 MG/0.1ML LIQD nasal spray kit, SMARTSIG:1 Spray(s) Both Nares PRN (Patient not taking: Reported on 09/29/2021)  Current Facility-Administered Medications (Other):    0.9 %  sodium chloride infusion  Allergies:  Allergies  Allergen Reactions   Morphine Itching    Can take hydrocodone without issues   Health Maintenance:  Health Maintenance  Topic Date Due   Zoster Vaccines- Shingrix (1 of 2) Never done   COVID-19 Vaccine (3 - Pfizer risk series) 03/30/2020   INFLUENZA VACCINE  07/19/2021   COLONOSCOPY (Pts 45-66yr Insurance coverage will need to be confirmed)  01/13/2025   TETANUS/TDAP  03/08/2025   Hepatitis C Screening  Completed   HPV VACCINES  Aged Out   Immunization History  Administered Date(s) Administered   Influenza Inj Mdck Quad With Preservative 10/24/2017, 10/31/2018   Influenza Split 10/05/2015   Influenza, High Dose Seasonal PF 11/25/2020   Influenza,Quad,Nasal, Live 10/05/2015   Influenza,inj,Quad PF,6+ Mos 11/15/2013, 10/16/2019   Influenza,inj,quad, With Preservative 09/26/2016   Influenza-Unspecified 12/19/2009   PFIZER(Purple Top)SARS-COV-2 Vaccination 02/10/2020, 03/02/2020   PPD Test 03/09/2015, 03/14/2016   Pneumococcal Conjugate-13 11/15/2013   Pneumococcal Polysaccharide-23 05/07/2020   Pneumococcal-Unspecified 12/20/1995   Td 12/19/2004   Tdap 03/09/2015    DEXA: N/A Colonoscopy:  12/2019, Dr. AHavery Moros on bASA 5 year follow up EGD: N/A PFTs  04/2018 CT chest 10/12/20- scheduled for  10/14/21- aortic atherosclerosis and emphysema CXR 2015 Echo 10/2017 Stress test 09/2016  Eye Exam: None Dentist: Dr. KJacquenette Shone Patient Care Team: MUnk Pinto MD as PCP - General (Internal Medicine) NJosue Hector MD as PCP - Cardiology (Cardiology)  Medical History:  has COPD GOLD 0 ; Bicuspid aortic valve; Mixed hyperlipidemia; Borderline systolic HTN; ED (erectile dysfunction); DJD (degenerative joint disease); Abnormal glucose; Vitamin D deficiency; Medication management; Chest pain, atypical most c/w ibs ; Cigarette smoker (70+ pack year current smoker); Chronic left shoulder pain; Congenital insufficiency of aortic  valve; Aortic atherosclerosis ([place]) - CT 09/2020; History of adenomatous polyp of colon; Ectatic thoracic aorta ([place]) - CT 09/2020; Insomnia; and BPH (benign prostatic hyperplasia) on their problem list. Surgical History:  He  has a past surgical history that includes Colon surgery (Jan 2012); Tonsillectomy; Hemorroidectomy; Colonoscopy (01/13/2014); Upper gi endoscopy (2015); Inguinal hernia repair (Right, 2015); Resection distal clavical (Right); and Upper gastrointestinal endoscopy. Family History:  His family history includes Benign prostatic hyperplasia in his father; Cancer in his sister; Diabetes in his mother; Heart disease in his father; Kidney disease in his sister. Social History:   reports that he has been smoking cigarettes. He started smoking about 49 years ago. He has a 70.50 pack-year smoking history. He quit smokeless tobacco use about 14 years ago.  His smokeless tobacco use included chew. He reports current alcohol use of about 5.0 standard drinks per week. He reports that he does not use drugs.   Review of Systems:  Review of Systems  Constitutional: Negative.  Negative for chills and fever.  HENT: Negative.  Negative for congestion, hearing loss, sinus pain, sore throat and tinnitus.   Eyes: Negative.  Negative for blurred vision and double vision.   Respiratory:  Positive for shortness of breath (with exertion). Negative for cough, hemoptysis, sputum production and wheezing.   Cardiovascular: Negative.  Negative for chest pain, palpitations and leg swelling.  Gastrointestinal: Negative.  Negative for abdominal pain, constipation, diarrhea, heartburn, nausea and vomiting.  Genitourinary: Negative.  Negative for dysuria and urgency.  Musculoskeletal:  Positive for back pain, joint pain and myalgias. Negative for falls and neck pain.  Skin: Negative.  Negative for rash.  Neurological:  Negative for dizziness, tingling, tremors, weakness and headaches.  Endo/Heme/Allergies:  Does not bruise/bleed easily.  Psychiatric/Behavioral:  Negative for depression and suicidal ideas. The patient is not nervous/anxious and does not have insomnia.    Physical Exam: Estimated body mass index is 30.85 kg/m as calculated from the following:   Height as of this encounter: 6' 1"  (1.854 m).   Weight as of this encounter: 233 lb 12.8 oz (106.1 kg). BP 122/74   Pulse 75   Temp 97.9 F (36.6 C)   Ht 6' 1"  (1.854 m)   Wt 233 lb 12.8 oz (106.1 kg)   SpO2 99%   BMI 30.85 kg/m  General Appearance: Well nourished, in no apparent distress.  Eyes: PERRLA, EOMs, conjunctiva no swelling or erythema, normal fundi and vessels.  Sinuses: No Frontal/maxillary tenderness  ENT/Mouth: Ext aud canals clear, normal light reflex with TMs without erythema, bulging. Good dentition. No erythema, swelling, or exudate on post pharynx. Tonsils not swollen or erythematous. Hearing normal.  Neck: Supple, thyroid normal. No bruits  Respiratory: Respiratory effort normal, BS equal bilaterally without rales, rhonchi, wheezing or stridor.  Cardio: RRR without murmurs, rubs or gallops. Brisk peripheral pulses without edema.  Chest: symmetric, with normal excursions and percussion.  Abdomen: Soft, general tenderness, no guarding, rebound, hernias, masses, or organomegaly.  Lymphatics:  Non tender without lymphadenopathy.  Genitourinary: defer Musculoskeletal: Full ROM all peripheral extremities,5/5 strength, and normal gait.  Skin: Warm, dry without rashes, lesions, ecchymosis. Neuro: Cranial nerves intact, reflexes equal bilaterally. Normal muscle tone, no cerebellar symptoms. Sensation intact.  Psych: Awake and oriented X 3, normal affect, Insight and Judgment appropriate.   EKG: No ST changes  Anthony Paul W Dajohn Ellender 2:32 PM [facility]

## 2021-09-29 ENCOUNTER — Ambulatory Visit (INDEPENDENT_AMBULATORY_CARE_PROVIDER_SITE_OTHER): Payer: Medicare Other | Admitting: Nurse Practitioner

## 2021-09-29 ENCOUNTER — Encounter: Payer: Self-pay | Admitting: Nurse Practitioner

## 2021-09-29 ENCOUNTER — Other Ambulatory Visit: Payer: Self-pay

## 2021-09-29 VITALS — BP 122/74 | HR 75 | Temp 97.9°F | Ht 73.0 in | Wt 233.8 lb

## 2021-09-29 DIAGNOSIS — I77810 Thoracic aortic ectasia: Secondary | ICD-10-CM | POA: Diagnosis not present

## 2021-09-29 DIAGNOSIS — N529 Male erectile dysfunction, unspecified: Secondary | ICD-10-CM

## 2021-09-29 DIAGNOSIS — Z136 Encounter for screening for cardiovascular disorders: Secondary | ICD-10-CM

## 2021-09-29 DIAGNOSIS — M1990 Unspecified osteoarthritis, unspecified site: Secondary | ICD-10-CM

## 2021-09-29 DIAGNOSIS — Z0001 Encounter for general adult medical examination with abnormal findings: Secondary | ICD-10-CM

## 2021-09-29 DIAGNOSIS — R7309 Other abnormal glucose: Secondary | ICD-10-CM

## 2021-09-29 DIAGNOSIS — J441 Chronic obstructive pulmonary disease with (acute) exacerbation: Secondary | ICD-10-CM | POA: Diagnosis not present

## 2021-09-29 DIAGNOSIS — E559 Vitamin D deficiency, unspecified: Secondary | ICD-10-CM

## 2021-09-29 DIAGNOSIS — J439 Emphysema, unspecified: Secondary | ICD-10-CM | POA: Diagnosis not present

## 2021-09-29 DIAGNOSIS — Z79899 Other long term (current) drug therapy: Secondary | ICD-10-CM

## 2021-09-29 DIAGNOSIS — F17210 Nicotine dependence, cigarettes, uncomplicated: Secondary | ICD-10-CM

## 2021-09-29 DIAGNOSIS — N400 Enlarged prostate without lower urinary tract symptoms: Secondary | ICD-10-CM

## 2021-09-29 DIAGNOSIS — Z6830 Body mass index (BMI) 30.0-30.9, adult: Secondary | ICD-10-CM

## 2021-09-29 DIAGNOSIS — Z23 Encounter for immunization: Secondary | ICD-10-CM

## 2021-09-29 DIAGNOSIS — E782 Mixed hyperlipidemia: Secondary | ICD-10-CM

## 2021-09-29 DIAGNOSIS — Z1389 Encounter for screening for other disorder: Secondary | ICD-10-CM

## 2021-09-29 DIAGNOSIS — I700 Atherosclerosis of aorta: Secondary | ICD-10-CM

## 2021-09-29 DIAGNOSIS — Q231 Congenital insufficiency of aortic valve: Secondary | ICD-10-CM

## 2021-09-29 DIAGNOSIS — R0989 Other specified symptoms and signs involving the circulatory and respiratory systems: Secondary | ICD-10-CM

## 2021-09-29 DIAGNOSIS — Z1329 Encounter for screening for other suspected endocrine disorder: Secondary | ICD-10-CM

## 2021-09-29 NOTE — Patient Instructions (Signed)

## 2021-09-30 LAB — COMPLETE METABOLIC PANEL WITH GFR
AG Ratio: 1.8 (calc) (ref 1.0–2.5)
ALT: 19 U/L (ref 9–46)
AST: 20 U/L (ref 10–35)
Albumin: 4.4 g/dL (ref 3.6–5.1)
Alkaline phosphatase (APISO): 54 U/L (ref 35–144)
BUN: 15 mg/dL (ref 7–25)
CO2: 26 mmol/L (ref 20–32)
Calcium: 9.8 mg/dL (ref 8.6–10.3)
Chloride: 105 mmol/L (ref 98–110)
Creat: 1.2 mg/dL (ref 0.70–1.35)
Globulin: 2.4 g/dL (calc) (ref 1.9–3.7)
Glucose, Bld: 96 mg/dL (ref 65–99)
Potassium: 4.8 mmol/L (ref 3.5–5.3)
Sodium: 140 mmol/L (ref 135–146)
Total Bilirubin: 0.7 mg/dL (ref 0.2–1.2)
Total Protein: 6.8 g/dL (ref 6.1–8.1)
eGFR: 67 mL/min/{1.73_m2} (ref >=60)

## 2021-09-30 LAB — LIPID PANEL
Cholesterol: 152 mg/dL (ref <200)
HDL: 32 mg/dL — ABNORMAL LOW (ref >=40)
LDL Cholesterol (Calc): 86 mg/dL (calc)
Non-HDL Cholesterol (Calc): 120 mg/dL (calc) (ref <130)
Total CHOL/HDL Ratio: 4.8 (calc) (ref <5.0)
Triglycerides: 258 mg/dL — ABNORMAL HIGH (ref <150)

## 2021-09-30 LAB — HEMOGLOBIN A1C
Hgb A1c MFr Bld: 5.5 % of total Hgb (ref <5.7)
Mean Plasma Glucose: 111 mg/dL
eAG (mmol/L): 6.2 mmol/L

## 2021-09-30 LAB — CBC WITH DIFFERENTIAL/PLATELET
Absolute Monocytes: 542 cells/uL (ref 200–950)
Basophils Absolute: 19 cells/uL (ref 0–200)
Basophils Relative: 0.3 %
Eosinophils Absolute: 63 cells/uL (ref 15–500)
Eosinophils Relative: 1 %
HCT: 45.9 % (ref 38.5–50.0)
Hemoglobin: 15.6 g/dL (ref 13.2–17.1)
Lymphs Abs: 1310 cells/uL (ref 850–3900)
MCH: 31.7 pg (ref 27.0–33.0)
MCHC: 34 g/dL (ref 32.0–36.0)
MCV: 93.3 fL (ref 80.0–100.0)
MPV: 11.5 fL (ref 7.5–12.5)
Monocytes Relative: 8.6 %
Neutro Abs: 4366 cells/uL (ref 1500–7800)
Neutrophils Relative %: 69.3 %
Platelets: 204 10*3/uL (ref 140–400)
RBC: 4.92 10*6/uL (ref 4.20–5.80)
RDW: 11.9 % (ref 11.0–15.0)
Total Lymphocyte: 20.8 %
WBC: 6.3 10*3/uL (ref 3.8–10.8)

## 2021-09-30 LAB — PSA: PSA: 0.24 ng/mL (ref <=4.00)

## 2021-09-30 LAB — URINALYSIS, ROUTINE W REFLEX MICROSCOPIC
Bilirubin Urine: NEGATIVE
Glucose, UA: NEGATIVE
Hgb urine dipstick: NEGATIVE
Ketones, ur: NEGATIVE
Leukocytes,Ua: NEGATIVE
Nitrite: NEGATIVE
Protein, ur: NEGATIVE
Specific Gravity, Urine: 1.006 (ref 1.001–1.035)
pH: 6.5 (ref 5.0–8.0)

## 2021-09-30 LAB — VITAMIN D 25 HYDROXY (VIT D DEFICIENCY, FRACTURES): Vit D, 25-Hydroxy: 62 ng/mL (ref 30–100)

## 2021-09-30 LAB — MICROALBUMIN / CREATININE URINE RATIO
Creatinine, Urine: 57 mg/dL (ref 20–320)
Microalb, Ur: <0.2 mg/dL

## 2021-09-30 LAB — MAGNESIUM: Magnesium: 2.2 mg/dL (ref 1.5–2.5)

## 2021-09-30 LAB — TSH: TSH: 1.33 mIU/L (ref 0.40–4.50)

## 2021-10-14 ENCOUNTER — Ambulatory Visit
Admission: RE | Admit: 2021-10-14 | Discharge: 2021-10-14 | Disposition: A | Discharge status: Home / Self Care | Payer: Medicare Other | Source: Ambulatory Visit | Attending: Adult Health | Admitting: Adult Health

## 2021-10-14 DIAGNOSIS — F17210 Nicotine dependence, cigarettes, uncomplicated: Secondary | ICD-10-CM

## 2021-12-23 NOTE — Progress Notes (Signed)
Patient ID: Anthony Paul, male   DOB: 1956/06/18, 66 y.o.   MRN: [id number]     66 y.o. referred by Dr Melford Aase for murmur in 2014 Diagnosed with bicuspid AV Sees dentist twice/yearr. Previous smoker. CRF elevated BP and cholesterol MRI 2013 with no coarctation and ascending aorta 3.9 cm   Echo 07/14/20 EF 60-65% mild AR moderate AS mean gradient 20 peak 32 mmHg DVI 0.42 AVA 1.5 cm2  Retired Geophysicist/field seismologist for [employer]  3 Daughters two in town and one in [place] with no dyspnea palpitations or syncope  Cardiac CT 12/11/18 functionally bicuspid AV with partial fusion left and right cusps. Aortic root 3.8 cm no coarctation normal right dominant coronary arteries Calcium score not done    No complaints Playing lots of poker last in [place]  Retired from [employer] 40 years    ROS: Denies fever, malais, weight loss, blurry vision, decreased visual acuity, cough, sputum, SOB, hemoptysis, pleuritic pain, palpitaitons, heartburn, abdominal pain, melena, lower extremity edema, claudication, or rash.  All other systems reviewed and negative  General: BP 120/72    Pulse 87    Ht 6' 1"  (1.854 m)    Wt 232 lb (105.2 kg)    SpO2 98%    BMI 30.61 kg/m  Affect appropriate Healthy:  appears stated age 66: normal Neck supple with no adenopathy JVP normal no bruits no thyromegaly Lungs clear with no wheezing and good diaphragmatic motion Heart:  S1/S2  AS/AR  murmur, no rub, gallop or click PMI normal Abdomen: benighn, BS positve, no tenderness, no AAA no bruit.  No HSM or HJR Distal pulses intact with no bruits No edema Neuro non-focal Skin warm and dry No muscular weakness     Current Outpatient Medications  Medication Sig Dispense Refill   acetaminophen (TYLENOL) 500 MG tablet Take 1,500 mg by mouth every 6 (six) hours as needed for mild pain.     aspirin 81 MG tablet Take 81 mg by mouth daily.     atorvastatin (LIPITOR) 20 MG tablet Take 1 tab every night for cholesterol. 90  tablet 3   Cholecalciferol (VITAMIN D) 2000 UNITS CAPS Take 1 capsule by mouth daily. Takes 500 mg     Cyanocobalamin (VITAMIN B12 PO) Take by mouth daily.     finasteride (PROSCAR) 5 MG tablet TAKE 1 TABLET DAILY FOR PROSTATE 90 tablet 3   Flaxseed, Linseed, (FLAXSEED OIL) 1200 MG CAPS Take 1 capsule by mouth daily.     MOVANTIK 25 MG TABS tablet TAKE 1 TABLET DAILY FOR REGULAR BM'S 90 tablet 3   NARCAN 4 MG/0.1ML LIQD nasal spray kit      Omega-3 Fatty Acids (FISH OIL PO) Take 1 tablet by mouth daily.     oxyCODONE-acetaminophen (PERCOCET) 10-325 MG tablet Take 1 tablet by mouth as needed.     traZODone (DESYREL) 150 MG tablet TAKE 1/2 TO 1 TABLET 1 HOUR BEFORE BEDTIME FOR SLEEP 90 tablet 1   vitamin C (ASCORBIC ACID) 250 MG tablet Take 250 mg by mouth daily.     albuterol (VENTOLIN HFA) 108 (90 Base) MCG/ACT inhaler Inhale 2 puffs into the lungs every 4 (four) hours as needed for wheezing or shortness of breath. Please give generic or the one that insurance covers (Patient not taking: Reported on 01/05/2022) 1 each 0   Budeson-Glycopyrrol-Formoterol (BREZTRI AEROSPHERE) 160-9-4.8 MCG/ACT AERO Inhale 2 puffs into the lungs in the morning and at bedtime. (Patient not taking: Reported on 01/05/2022) 5.9 g 0  Current Facility-Administered Medications  Medication Dose Route Frequency Provider Last Rate Last Admin   0.9 %  sodium chloride infusion  500 mL Intravenous Once Armbruster, Carlota Raspberry, MD       ipratropium-albuterol (DUONEB) 0.5-2.5 (3) MG/3ML nebulizer solution 3 mL  3 mL Nebulization Once Liane Comber, NP        Allergies  Morphine  Electrocardiogram: 06/23/20 SR rate 88 normal   Assessment and Plan  Bicuspid Aortic Valve:  with moderate AS mild AR aortic root 4.0 10/14/21 on lung cancer screening CT Update echo    GI:  Seeing pain clinic CT with no pathology low risk for SMA disease  F/u GI  Elevated Triglycerides:  Continue fenobibrate  Labs with primary   CAD:  None noted  on cardiac CT 12/11/18 On statin and ASA   COPD:  no active wheezing 70 pack year history lung cancer CT 10/16/21 no cancer Counseled on smoking cessation < 10 minutes   Echo for AS  F/U with me in a year   [employer]

## 2022-01-05 ENCOUNTER — Other Ambulatory Visit: Payer: Self-pay

## 2022-01-05 ENCOUNTER — Ambulatory Visit (INDEPENDENT_AMBULATORY_CARE_PROVIDER_SITE_OTHER): Payer: Medicare Other | Admitting: Cardiovascular Disease

## 2022-01-05 ENCOUNTER — Encounter: Payer: Self-pay | Admitting: Cardiovascular Disease

## 2022-01-05 VITALS — BP 120/72 | HR 87 | Ht 73.0 in | Wt 232.0 lb

## 2022-01-05 DIAGNOSIS — I350 Nonrheumatic aortic (valve) stenosis: Secondary | ICD-10-CM

## 2022-01-05 NOTE — Patient Instructions (Signed)
Medication Instructions:  ?Your physician recommends that you continue on your current medications as directed. Please refer to the Current Medication list given to you today. ? ?*If you need a refill on your cardiac medications before your next appointment, please call your pharmacy* ? ?Lab Work: ?If you have labs (blood work) drawn today and your tests are completely normal, you will receive your results only by: ?MyChart Message (if you have MyChart) OR ?A paper copy in the mail ?If you have any lab test that is abnormal or we need to change your treatment, we will call you to review the results. ? ?Testing/Procedures: ?Your physician has requested that you have an echocardiogram. Echocardiography is a painless test that uses sound waves to create images of your heart. It provides your doctor with information about the size and shape of your heart and how well your heart?s chambers and valves are working. This procedure takes approximately one hour. There are no restrictions for this procedure. ? ?Follow-Up: ?At [facility], you and your health needs are our priority.  As part of our continuing mission to provide you with exceptional heart care, we have created designated Provider Care Teams.  These Care Teams include your primary Cardiologist (physician) and Advanced Practice Providers (APPs -  Physician Assistants and Nurse Practitioners) who all work together to provide you with the care you need, when you need it. ? ?We recommend signing up for the patient portal called "MyChart".  Sign up information is provided on this After Visit Summary.  MyChart is used to connect with patients for Virtual Visits (Telemedicine).  Patients are able to view lab/test results, encounter notes, upcoming appointments, etc.  Non-urgent messages can be sent to your provider as well.   ?To learn more about what you can do with MyChart, go to [email or web address].   ? ?Your next appointment:   ?1 year(s) ? ?The format for  your next appointment:   ?In Person ? ?Provider:   ?Peter Nishan, MD { ? ?

## 2022-01-17 ENCOUNTER — Other Ambulatory Visit: Payer: Self-pay

## 2022-01-17 ENCOUNTER — Ambulatory Visit (HOSPITAL_COMMUNITY): Payer: Medicare Other | Attending: Internal Medicine

## 2022-01-17 ENCOUNTER — Other Ambulatory Visit: Payer: Self-pay | Admitting: Nurse Practitioner

## 2022-01-17 ENCOUNTER — Other Ambulatory Visit: Payer: Self-pay | Admitting: Adult Health Nurse Practitioner

## 2022-01-17 ENCOUNTER — Other Ambulatory Visit: Payer: Self-pay | Admitting: Adult Health

## 2022-01-17 DIAGNOSIS — J441 Chronic obstructive pulmonary disease with (acute) exacerbation: Secondary | ICD-10-CM

## 2022-01-17 DIAGNOSIS — N401 Enlarged prostate with lower urinary tract symptoms: Secondary | ICD-10-CM

## 2022-01-17 DIAGNOSIS — N138 Other obstructive and reflux uropathy: Secondary | ICD-10-CM

## 2022-01-17 DIAGNOSIS — I350 Nonrheumatic aortic (valve) stenosis: Secondary | ICD-10-CM | POA: Insufficient documentation

## 2022-01-17 LAB — ECHOCARDIOGRAM COMPLETE
AR max vel: 3.55 cm2
AV Area VTI: 3.74 cm2
AV Area mean vel: 3.52 cm2
AV Mean grad: 26 mmHg
AV Peak grad: 45.1 mmHg
Ao pk vel: 3.36 m/s
Area-P 1/2: 3.37 cm2
P 1/2 time: 456 msec
S' Lateral: 3.2 cm

## 2022-02-02 ENCOUNTER — Ambulatory Visit (INDEPENDENT_AMBULATORY_CARE_PROVIDER_SITE_OTHER): Payer: Medicare Other | Admitting: Adult Health

## 2022-02-02 ENCOUNTER — Other Ambulatory Visit: Payer: Self-pay

## 2022-02-02 ENCOUNTER — Encounter: Payer: Self-pay | Admitting: Adult Health

## 2022-02-02 VITALS — BP 118/78 | HR 79 | Temp 97.5°F | Ht 73.0 in | Wt 234.0 lb

## 2022-02-02 DIAGNOSIS — Z79899 Other long term (current) drug therapy: Secondary | ICD-10-CM

## 2022-02-02 DIAGNOSIS — E782 Mixed hyperlipidemia: Secondary | ICD-10-CM

## 2022-02-02 DIAGNOSIS — E559 Vitamin D deficiency, unspecified: Secondary | ICD-10-CM

## 2022-02-02 DIAGNOSIS — I700 Atherosclerosis of aorta: Secondary | ICD-10-CM | POA: Diagnosis not present

## 2022-02-02 DIAGNOSIS — F17210 Nicotine dependence, cigarettes, uncomplicated: Secondary | ICD-10-CM

## 2022-02-02 DIAGNOSIS — R7309 Other abnormal glucose: Secondary | ICD-10-CM | POA: Diagnosis not present

## 2022-02-02 DIAGNOSIS — J449 Chronic obstructive pulmonary disease, unspecified: Secondary | ICD-10-CM

## 2022-02-02 LAB — COMPLETE METABOLIC PANEL WITH GFR
AG Ratio: 1.9 (calc) (ref 1.0–2.5)
ALT: 22 U/L (ref 9–46)
AST: 13 U/L (ref 10–35)
Albumin: 4.2 g/dL (ref 3.6–5.1)
Alkaline phosphatase (APISO): 56 U/L (ref 35–144)
BUN: 12 mg/dL (ref 7–25)
CO2: 24 mmol/L (ref 20–32)
Calcium: 9.3 mg/dL (ref 8.6–10.3)
Chloride: 109 mmol/L (ref 98–110)
Creat: 1.26 mg/dL (ref 0.70–1.35)
Globulin: 2.2 g/dL (calc) (ref 1.9–3.7)
Glucose, Bld: 117 mg/dL — ABNORMAL HIGH (ref 65–99)
Potassium: 4.6 mmol/L (ref 3.5–5.3)
Sodium: 140 mmol/L (ref 135–146)
Total Bilirubin: 0.5 mg/dL (ref 0.2–1.2)
Total Protein: 6.4 g/dL (ref 6.1–8.1)
eGFR: 63 mL/min/{1.73_m2} (ref >=60)

## 2022-02-02 LAB — CBC WITH DIFFERENTIAL/PLATELET
Absolute Monocytes: 462 cells/uL (ref 200–950)
Basophils Absolute: 21 cells/uL (ref 0–200)
Basophils Relative: 0.3 %
Eosinophils Absolute: 28 cells/uL (ref 15–500)
Eosinophils Relative: 0.4 %
HCT: 44 % (ref 38.5–50.0)
Hemoglobin: 15.1 g/dL (ref 13.2–17.1)
Lymphs Abs: 883 cells/uL (ref 850–3900)
MCH: 32.8 pg (ref 27.0–33.0)
MCHC: 34.3 g/dL (ref 32.0–36.0)
MCV: 95.7 fL (ref 80.0–100.0)
MPV: 12 fL (ref 7.5–12.5)
Monocytes Relative: 6.7 %
Neutro Abs: 5506 cells/uL (ref 1500–7800)
Neutrophils Relative %: 79.8 %
Platelets: 191 10*3/uL (ref 140–400)
RBC: 4.6 10*6/uL (ref 4.20–5.80)
RDW: 12.5 % (ref 11.0–15.0)
Total Lymphocyte: 12.8 %
WBC: 6.9 10*3/uL (ref 3.8–10.8)

## 2022-02-02 LAB — LIPID PANEL
Cholesterol: 138 mg/dL (ref <200)
HDL: 31 mg/dL — ABNORMAL LOW (ref >=40)
LDL Cholesterol (Calc): 76 mg/dL (calc)
Non-HDL Cholesterol (Calc): 107 mg/dL (calc) (ref <130)
Total CHOL/HDL Ratio: 4.5 (calc) (ref <5.0)
Triglycerides: 216 mg/dL — ABNORMAL HIGH (ref <150)

## 2022-02-02 LAB — TSH: TSH: 1.75 mIU/L (ref 0.40–4.50)

## 2022-02-02 LAB — MAGNESIUM: Magnesium: 2.2 mg/dL (ref 1.5–2.5)

## 2022-02-02 NOTE — Patient Instructions (Addendum)
SMOKING CESSATION  [employer]  [phone] for more information or for a free program for smoking cessation help.   You can call QUIT SMART 1-800-QUIT-NOW for free nicotine patches or replacement therapy- if they are out- keep calling  [facility] Can call for smoking cessation classes, [phone]  If you have a smart phone, please look up Smoke Free app, this will help you stay on track and give you information about money you have saved, life that you have gained back and a ton of more information.     ADVANTAGES OF QUITTING SMOKING Within 20 minutes, blood pressure decreases. Your pulse is at normal level. After 8 hours, carbon monoxide levels in the blood return to normal. Your oxygen level increases. After 24 hours, the chance of having a heart attack starts to decrease. Your breath, hair, and body stop smelling like smoke. After 48 hours, damaged nerve endings begin to recover. Your sense of taste and smell improve. After 72 hours, the body is virtually free of nicotine. Your bronchial tubes relax and breathing becomes easier. After 2 to 12 weeks, lungs can hold more air. Exercise becomes easier and circulation improves. After 1 year, the risk of coronary heart disease is cut in half. After 5 years, the risk of stroke falls to the same as a nonsmoker. After 10 years, the risk of lung cancer is cut in half and the risk of other cancers decreases significantly. After 15 years, the risk of coronary heart disease drops, usually to the level of a nonsmoker. You will have extra money to spend on things other than cigarettes.      High-Fiber Eating Plan Fiber, also called dietary fiber, is a type of carbohydrate. It is found foods such as fruits, vegetables, whole grains, and beans. A high-fiber diet can have many health benefits. Your health care provider may recommend a high-fiber diet to help: Prevent constipation. Fiber can make your bowel  movements more regular. Lower your cholesterol. Relieve the following conditions: Inflammation of veins in the anus (hemorrhoids). Inflammation of specific areas of the digestive tract (uncomplicated diverticulosis). A problem of the large intestine, also called the colon, that sometimes causes pain and diarrhea (irritable bowel syndrome, or IBS). Prevent overeating as part of a weight-loss plan. Prevent heart disease, type 2 diabetes, and certain cancers. What are tips for following this plan? Reading food labels  Check the nutrition facts label on food products for the amount of dietary fiber. Choose foods that have 5 grams of fiber or more per serving. The goals for recommended daily fiber intake include: Men (age 23 or younger): 34-38 g. Men (over age 7): 28-34 g. Women (age 38 or younger): 25-28 g. Women (over age 9): 22-25 g. Your daily fiber goal is _____________ g. Shopping Choose whole fruits and vegetables instead of processed forms, such as apple juice or applesauce. Choose a wide variety of high-fiber foods such as avocados, lentils, oats, and kidney beans. Read the nutrition facts label of the foods you choose. Be aware of foods with added fiber. These foods often have high sugar and sodium amounts per serving. Cooking Use whole-grain flour for baking and cooking. Cook with brown rice instead of white rice. Meal planning Start the day with a breakfast that is high in fiber, such as a cereal that contains 5 g of fiber or more per serving. Eat breads and cereals that are made with whole-grain flour instead of refined flour or white  flour. Eat brown rice, bulgur wheat, or millet instead of white rice. Use beans in place of meat in soups, salads, and pasta dishes. Be sure that half of the grains you eat each day are whole grains. General information You can get the recommended daily intake of dietary fiber by: Eating a variety of fruits, vegetables, grains, nuts, and  beans. Taking a fiber supplement if you are not able to take in enough fiber in your diet. It is better to get fiber through food than from a supplement. Gradually increase how much fiber you consume. If you increase your intake of dietary fiber too quickly, you may have bloating, cramping, or gas. Drink plenty of water to help you digest fiber. Choose high-fiber snacks, such as berries, raw vegetables, nuts, and popcorn. What foods should I eat? Fruits Berries. Pears. Apples. Oranges. Avocado. Prunes and raisins. Dried figs. Vegetables Sweet potatoes. Spinach. Kale. Artichokes. Cabbage. Broccoli. Cauliflower. Green peas. Carrots. Squash. Grains Whole-grain breads. Multigrain cereal. Oats and oatmeal. Brown rice. Barley. Bulgur wheat. [place]. Quinoa. Bran muffins. Popcorn. Rye wafer crackers. Meats and other proteins Navy beans, kidney beans, and pinto beans. Soybeans. Split peas. Lentils. Nuts and seeds. Dairy Fiber-fortified yogurt. Beverages Fiber-fortified soy milk. Fiber-fortified orange juice. Other foods Fiber bars. The items listed above may not be a complete list of recommended foods and beverages. Contact a dietitian for more information. What foods should I avoid? Fruits Fruit juice. Cooked, strained fruit. Vegetables Fried potatoes. Canned vegetables. Well-cooked vegetables. Grains White bread. Pasta made with refined flour. White rice. Meats and other proteins Fatty cuts of meat. Fried chicken or fried fish. Dairy Milk. Yogurt. Cream cheese. Sour cream. Fats and oils Butters. Beverages Soft drinks. Other foods Cakes and pastries. The items listed above may not be a complete list of foods and beverages to avoid. Talk with your dietitian about what choices are best for you. Summary Fiber is a type of carbohydrate. It is found in foods such as fruits, vegetables, whole grains, and beans. A high-fiber diet has many benefits. It can help to prevent constipation, lower  blood cholesterol, aid weight loss, and reduce your risk of heart disease, diabetes, and certain cancers. Increase your intake of fiber gradually. Increasing fiber too quickly may cause cramping, bloating, and gas. Drink plenty of water while you increase the amount of fiber you consume. The best sources of fiber include whole fruits and vegetables, whole grains, nuts, seeds, and beans. This information is not intended to replace advice given to you by your health care provider. Make sure you discuss any questions you have with your health care provider. Document Revised: 04/09/2020 Document Reviewed: 04/09/2020 Elsevier Patient Education  2022 [employer].

## 2022-02-02 NOTE — Progress Notes (Signed)
3 MONTH FOLLOW UP °Assessment:  ° ° °Aortic atherosclerosis (HCC) - per CT 09/2020 °Control blood pressure, cholesterol, glucose, increase exercise.  °-     Lipid panel ° °Ectatic thoracic aorta (HCC) - per CT 09/2020 °Control blood pressure, cholesterol, glucose, increase exercise.  °Encourage smoking cessation °Attention on annual low dose screening exams ° °Mixed hyperlipidemia °Titrate statin for LDL goal <70 discussed due to smoking hx and risk °Continue low cholesterol diet and exercise.  °Check lipid panel.  °-     Lipid panel °-     TSH ° °Borderline systolic HTN °Doing well with lifestyle off of meds °Monitor blood pressure at home; call if consistently over 130/80 °Continue DASH diet.   °Reminder to go to the ER if any CP, SOB, nausea, dizziness, severe HA, changes vision/speech, left arm numbness and tingling and jaw pain. °-     CBC with Differential/Platelet °-     COMPLETE METABOLIC PANEL WITH GFR °-     Magnesium ° °Bicuspid aortic valve °Dr. Nishan follows; denies concerning sx °Recent stable ECHO ° °COPD GOLD 0  °COPE per imaging, PFTs were ok °Does well with inhaler PRN with respiratory infections °Smoking cessation encouraged ° °Cigarette smoker °Discussed risks associated with tobacco use and advised to reduce or quit °Patient is ready to do so and plans to taper slowly, resources given °Declines Chantix or other medication °Will follow up at the next visit °UTD low dose CT screening, next due 09/2022 ° °Vitamin D deficiency °Continue supplement ° °Abnormal glucose °Recent A1Cs at goal °Discussed diet/exercise, weight management  °Defer A1C; check CMP °-     COMPLETE METABOLIC PANEL WITH GFR ° °Medication management °-     CBC with Differential/Platelet °-     COMPLETE METABOLIC PANEL WITH GFR °-     Magnesium ° °Erectile dysfunction, unspecified erectile dysfunction type °Declines meds ° °Insomnia, unspecified type °Trazodone working well  ° ° °Over 30 minutes of exam, counseling, chart review,  and critical decision making was performed ° °Future Appointments  °Date Time Provider Department Center  °06/09/2022  2:30 PM [name], [name], NP [facility] None  °09/29/2022  2:00 PM Mull, [name], NP [facility] None  ° ° ° °Subjective:  °Anthony Paul is a 66 y.o. male who presents for 3 month follow up for HTN, hyperlipidemia, glucose management and vitamin D Def.  ° °He sees pain management for bilateral rib pain, some flank pain. Has tried lyrica/gabpentin without help, no help with Cymbalta. Had normal AB US 11/2019, and GI visit, colonoscopy in 2021 and CT 02/2019 at [facility]. Had tried trigger points, failed the trial of the spinal stimulator.  °Now on oxycodone by pain management.  ° °Denies noable GI sx other than constipation (onset after oxycodone, well managed by movantik).  ° °Also RLQ pain/tenderness persistent for 2+ years, worse with lifting or bearing down, hx of R inguinal hernia repair by Dr. [name] in 2015, recently had follow up, thought possible hip etiology and saw [facility], mild arthritis but not felt related to his groin pain. He hasn't decided if will follow up further with Dr. [name].  ° °He continues to smoke, started age 16, smoked a pack a day from 20 yr to 61 years old 40+ pack year history, but states he has decreased to less than a pack, COPD 0 per Dr. Wert in 2019, is not on an maintenance inhaler.  He wants to quit smoking, plans to slowly taper. Did not do well   with chantix. Last CT lung screen benign in 10/14/2021.   He takes trazodone 150 mg night with modest benefit for insomnia.   BMI is Body mass index is 30.87 kg/m., he has not been working on diet and exercise.  Wt Readings from Last 3 Encounters:  02/02/22 234 lb (106.1 kg)  01/05/22 232 lb (105.2 kg)  09/29/21 233 lb 12.8 oz (106.1 kg)   He has a bicuspid aortic valve, moderate follows with cardiology Dr. Johnsie Cancel, normal stress test in 09/2016, had recent ECHO 01/17/2022 with moderate AS/AR with normal EF.  Planning Echo and CTA aorta in 1 year.   His blood pressure has been controlled at home, today their BP is BP: 118/78 He does not workout. He denies chest pain, shortness of breath, dizziness.   He has aortic atherosclerosis and ectaic aora per CT 09/2020.   He is on cholesterol medication (atorvastatin 20 mg daily) and denies myalgias. His cholesterol is not at goal. The cholesterol last visit was:   Lab Results  Component Value Date   CHOL 152 09/29/2021   HDL 32 (L) 09/29/2021   LDLCALC 86 09/29/2021   TRIG 258 (H) 09/29/2021   CHOLHDL 4.8 09/29/2021   He has not been working on diet and exercise for glucose managment, and denies nausea, paresthesia of the feet, polydipsia, polyuria, and visual disturbances. Last A1C in the office was:  Lab Results  Component Value Date   HGBA1C 5.5 09/29/2021   Last GFR Lab Results  Component Value Date   GFRNONAA 67 06/09/2021   Patient is on Vitamin D supplement, taking 2000 IU daily  Lab Results  Component Value Date   VD25OH 62 09/29/2021     He is on finasteride 5 mg for BPH with weak stream has since resolved. He has ED but declined meds. Last PSA was: Lab Results  Component Value Date   PSA 0.24 09/29/2021      Medication Review:    Current Outpatient Medications (Cardiovascular):    atorvastatin (LIPITOR) 20 MG tablet, Take 1 tab every night for cholesterol.   Current Outpatient Medications (Respiratory):    albuterol (VENTOLIN HFA) 108 (90 Base) MCG/ACT inhaler, INHALE 2 PUFFS INTO THE LUNGS EVERY 4 (FOUR) HOURS AS NEEDED FOR WHEEZING OR SHORTNESS OF BREATH  Current Facility-Administered Medications (Respiratory):    ipratropium-albuterol (DUONEB) 0.5-2.5 (3) MG/3ML nebulizer solution 3 mL  Current Outpatient Medications (Analgesics):    acetaminophen (TYLENOL) 500 MG tablet, Take 1,500 mg by mouth every 6 (six) hours as needed for mild pain.   aspirin 81 MG tablet, Take 81 mg by mouth daily.    oxyCODONE-acetaminophen (PERCOCET) 10-325 MG tablet, Take 1 tablet by mouth as needed.   Current Outpatient Medications (Hematological):    Cyanocobalamin (VITAMIN B12 PO), Take by mouth daily.   Current Outpatient Medications (Other):    Cholecalciferol (VITAMIN D) 2000 UNITS CAPS, Take 1 capsule by mouth daily. Takes 500 mg   finasteride (PROSCAR) 5 MG tablet, TAKE 1 TABLET DAILY FOR PROSTATE   Flaxseed, Linseed, (FLAXSEED OIL) 1200 MG CAPS, Take 1 capsule by mouth daily.   MOVANTIK 25 MG TABS tablet, TAKE 1 TABLET DAILY FOR REGULAR BM'S   NARCAN 4 MG/0.1ML LIQD nasal spray kit,    Omega-3 Fatty Acids (FISH OIL PO), Take 1 tablet by mouth daily.   traZODone (DESYREL) 150 MG tablet, TAKE 1/2 TO 1 TABLET 1 HOUR BEFORE BEDTIME FOR SLEEP   vitamin C (ASCORBIC ACID) 250 MG tablet, Take 250 mg by  mouth daily.  Current Facility-Administered Medications (Other):    0.9 %  sodium chloride infusion  Allergies: Allergies  Allergen Reactions   Morphine Itching    Can take hydrocodone without issues    Current Problems (verified) has COPD GOLD 0 ; Bicuspid aortic valve; Mixed hyperlipidemia; Borderline systolic HTN; ED (erectile dysfunction); DJD (degenerative joint disease); Abnormal glucose; Vitamin D deficiency; Medication management; Chest pain, atypical most c/w ibs ; Cigarette smoker (70+ pack year current smoker); Chronic left shoulder pain; Congenital insufficiency of aortic valve; Aortic atherosclerosis ([place]) - CT 09/2020; History of adenomatous polyp of colon; Ectatic thoracic aorta ([place]) - CT 09/2020; Insomnia; and BPH (benign prostatic hyperplasia) on their problem list.  Screening Tests Immunization History  Administered Date(s) Administered   Influenza Inj Mdck Quad With Preservative 10/24/2017, 10/31/2018   Influenza Split 10/05/2015   Influenza, High Dose Seasonal PF 11/25/2020, 09/29/2021   Influenza,Quad,Nasal, Live 10/05/2015   Influenza,inj,Quad PF,6+ Mos 11/15/2013,  10/16/2019   Influenza,inj,quad, With Preservative 09/26/2016   Influenza-Unspecified 12/19/2009   PFIZER(Purple Top)SARS-COV-2 Vaccination 02/10/2020, 03/02/2020   PPD Test 03/09/2015, 03/14/2016   Pneumococcal Conjugate-13 11/15/2013   Pneumococcal Polysaccharide-23 05/07/2020   Pneumococcal-Unspecified 12/20/1995   Td 12/19/2004   Tdap 03/09/2015    Preventative care: Last colonoscopy: 12/2019, Dr. Havery Moros, 1 polyp, hx of precancerous polyps, 5 year follow up  CT chest: 09/2020 annual follow   Prior vaccinations: TD or Tdap: 2016  Influenza: 2021 Pneumococcal: 2021 Prevnar13: 2014 Shingles/Zostavax: will check about shingrix  Covid 19: 2/2 pfizer + booster - information requested  Names of Other Physician/Practitioners you currently use: 1. [facility] here for primary care 2. [facility], eye doctor, last visit 2022 3. Dr. Lanny Hurst, dentist, last visit 2022, q33m Patient Care Team: MUnk Pinto MD as PCP - General (Internal Medicine) NJosue Hector MD as PCP - Cardiology (Cardiology)  Surgical: He  has a past surgical history that includes Colon surgery (Jan 2012); Tonsillectomy; Hemorroidectomy; Colonoscopy (01/13/2014); Upper gi endoscopy (2015); Inguinal hernia repair (Right, 2015); Resection distal clavical (Right); and Upper gastrointestinal endoscopy. Family His family history includes Benign prostatic hyperplasia in his father; Cancer in his sister; Diabetes in his mother; Heart disease in his father; Kidney disease in his sister. Social history  He reports that he has been smoking cigarettes. He started smoking about 50 years ago. He has a 70.50 pack-year smoking history. He quit smokeless tobacco use about 15 years ago.  His smokeless tobacco use included chew. He reports current alcohol use of about 5.0 standard drinks per week. He reports that he does not use drugs.  MEDICARE WELLNESS OBJECTIVES: Physical activity:    Cardiac risk factors:   Depression/mood screen:   [facility]  Decreased Interest 0  Down, Depressed, Hopeless 0  PHQ - 2 Score 0    ADLs:  In your present state of health, do you have any difficulty performing the following activities: 06/09/2021  Hearing? N  Vision? N  Difficulty concentrating or making decisions? N  Walking or climbing stairs? N  Dressing or bathing? N  Doing errands, shopping? N  Some recent data might be hidden     Cognitive Testing  Alert? Yes  Normal Appearance?Yes  Oriented to person? Yes  Place? Yes   Time? Yes  Recall of three objects?  Yes  Can perform simple calculations? Yes  Displays appropriate judgment?Yes  Can read the correct time from a watch face?Yes  EOL planning:  Review of Systems  Constitutional:  Negative for malaise/fatigue and weight loss.  HENT:  Negative for hearing loss and tinnitus.   Eyes:  Negative for blurred vision and double vision.  Respiratory:  Negative for cough, sputum production, shortness of breath and wheezing.   Cardiovascular:  Negative for chest pain, palpitations, orthopnea, claudication, leg swelling and PND.  Gastrointestinal:  Positive for abdominal pain (chronic flank and RLQ). Negative for blood in stool, constipation (Improved with med, ongoing with oxycodone), diarrhea, heartburn, melena, nausea and vomiting.  Genitourinary: Negative.   Musculoskeletal:  Negative for falls, joint pain and myalgias.  Skin:  Negative for rash.  Neurological:  Negative for dizziness, tingling, sensory change, weakness and headaches.  Endo/Heme/Allergies:  Negative for polydipsia.  Psychiatric/Behavioral: Negative.  Negative for depression, memory loss, substance abuse and suicidal ideas. The patient is not nervous/anxious and does not have insomnia.   All other systems reviewed and are negative.    Objective:   Today's Vitals   02/02/22 1426  BP: 118/78  Pulse: 79  Temp: (!) 97.5 F  (36.4 C)  SpO2: 99%  Weight: 234 lb (106.1 kg)  Height: 6' 1" (1.854 m)   Body mass index is 30.87 kg/m.  General appearance: alert, no distress, WD/WN, male HEENT: normocephalic, sclerae anicteric, TMs pearly, nares patent, no discharge or erythema, pharynx normal Oral cavity: MMM, no lesions Neck: supple, no lymphadenopathy, no thyromegaly, no masses Heart: RRR, normal S1, S2, mild 2/6 systolic murmur R 2nd ICS Lungs: CTA bilaterally, no wheezes, rhonchi, or rales Abdomen: +bs, soft, RLQ tenderness with subtle localized swelling without clear hearnia, non-erythematous,non distended, no masses, no hepatomegaly, no splenomegaly Musculoskeletal: nontender, no swelling, no obvious deformity Extremities: no edema, no cyanosis, no clubbing Pulses: 2+ symmetric, upper and lower extremities, normal cap refill Neurological: alert, oriented x 3, CN2-12 intact, strength normal upper extremities and lower extremities, sensation normal throughout, DTRs 2+ throughout, no cerebellar signs, gait normal Psychiatric: normal affect, behavior normal, pleasant     Izora Ribas, NP   02/02/2022

## 2022-06-05 ENCOUNTER — Other Ambulatory Visit: Payer: Self-pay | Admitting: Adult Health

## 2022-06-09 ENCOUNTER — Encounter: Payer: Self-pay | Admitting: Nurse Practitioner

## 2022-06-09 ENCOUNTER — Ambulatory Visit (INDEPENDENT_AMBULATORY_CARE_PROVIDER_SITE_OTHER): Payer: Medicare Other | Admitting: Nurse Practitioner

## 2022-06-09 VITALS — BP 110/72 | HR 77 | Temp 98.0°F | Wt 237.0 lb

## 2022-06-09 DIAGNOSIS — I700 Atherosclerosis of aorta: Secondary | ICD-10-CM

## 2022-06-09 DIAGNOSIS — R239 Unspecified skin changes: Secondary | ICD-10-CM

## 2022-06-09 DIAGNOSIS — I77810 Thoracic aortic ectasia: Secondary | ICD-10-CM | POA: Diagnosis not present

## 2022-06-09 DIAGNOSIS — Z86010 Personal history of colonic polyps: Secondary | ICD-10-CM

## 2022-06-09 DIAGNOSIS — N400 Enlarged prostate without lower urinary tract symptoms: Secondary | ICD-10-CM

## 2022-06-09 DIAGNOSIS — M1990 Unspecified osteoarthritis, unspecified site: Secondary | ICD-10-CM

## 2022-06-09 DIAGNOSIS — Q231 Congenital insufficiency of aortic valve: Secondary | ICD-10-CM | POA: Diagnosis not present

## 2022-06-09 DIAGNOSIS — M79671 Pain in right foot: Secondary | ICD-10-CM

## 2022-06-09 DIAGNOSIS — J449 Chronic obstructive pulmonary disease, unspecified: Secondary | ICD-10-CM

## 2022-06-09 DIAGNOSIS — E782 Mixed hyperlipidemia: Secondary | ICD-10-CM

## 2022-06-09 DIAGNOSIS — Z0000 Encounter for general adult medical examination without abnormal findings: Secondary | ICD-10-CM

## 2022-06-09 DIAGNOSIS — E559 Vitamin D deficiency, unspecified: Secondary | ICD-10-CM

## 2022-06-09 DIAGNOSIS — Z79899 Other long term (current) drug therapy: Secondary | ICD-10-CM

## 2022-06-09 DIAGNOSIS — N529 Male erectile dysfunction, unspecified: Secondary | ICD-10-CM

## 2022-06-09 DIAGNOSIS — R7309 Other abnormal glucose: Secondary | ICD-10-CM

## 2022-06-09 NOTE — Patient Instructions (Signed)
Smoking Cessation You will learn methods to help you quit smoking and how quitting can help prevent a variety of health problems and improve your health. To view the content, go to this web address: [email or web address]  This video will expire on: 03/07/2024. If you need access to this video following this date, please reach out to the healthcare provider who assigned it to you. This information is not intended to replace advice given to you by your health care provider. Make sure you discuss any questions you have with your health care provider. Elsevier Patient Education  2023 [employer]  

## 2022-06-09 NOTE — Progress Notes (Incomplete)
MEDICARE ANNUAL WELLNESS VISIT AND FOLLOW UP Assessment:   Anthony Paul was seen today for medicare wellness and follow-up.  Diagnoses and all orders for this visit:  Encounter for Medicare annual wellness exam Due annually   Aortic atherosclerosis ([place]) - per CT 09/2020 Control blood pressure, cholesterol, glucose, increase exercise.  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) - per CT 09/2020 Control blood pressure, cholesterol, glucose, increase exercise.  Encourage smoking cessation Attention on annual low dose screening exams  Mixed hyperlipidemia Titrate statin for LDL goal <70 discussed due to smoking hx and risk Continue low cholesterol diet and exercise.  Check lipid panel.  -     Lipid panel -     TSH  Borderline systolic HTN Doing well with lifestyle off of meds Monitor blood pressure at home; call if consistently over 130/80 Continue DASH diet.   Reminder to go to the ER if any CP, SOB, nausea, dizziness, severe HA, changes vision/speech, left arm numbness and tingling and jaw pain. -     CBC with Differential/Platelet -     COMPLETE METABOLIC PANEL WITH GFR -     Magnesium  Bicuspid aortic valve Dr. Johnsie Cancel follows; denies concerning sx See once yearly  COPD GOLD 0  COPE per imaging, PFTs were ok Does well with inhaler PRN with respiratory infections Smoking cessation encouraged  Cigarette smoker Discussed risks associated with tobacco use and advised to reduce or quit Patient is ready to do so and plans to taper slowly, resources given Declines Chantix or other medication Will follow up at the next visit  -lung cancer screening with low dose CT discussed as recommended by guidelines based on age, number of pack year history.  Discussed risks of screening including but not limited to false positives on xray, further testing or consultation with specialist, and possible false negative CT as well. Understanding expressed and wishes to proceed with CT testing.  Order placed.    Vitamin D deficiency Continue supplement  Abnormal glucose Recent A1Cs at goal Discussed diet/exercise, weight management  Defer A1C; check CMP -     COMPLETE METABOLIC PANEL WITH GFR  Medication management -     CBC with Differential/Platelet -     COMPLETE METABOLIC PANEL WITH GFR -     Magnesium  Erectile dysfunction, unspecified erectile dysfunction type Declines meds  History of adenomatous polyp of colon Increase fiber; colonoscopy UTD  Osteoarthritis, unspecified osteoarthritis type, unspecified site Ortho/pain management  Chronic left shoulder pain Ortho/pain management  Insomnia, unspecified type Trazodone working well   Benign prostatic hyperplasia without lower urinary tract symptoms Continue finasteride  Chronic RLQ pain/ History of right inguinal hernia repair Has had fairly extensive workup without clear etiology, hx of hernia repair, after discussion will refer back for Dr. Dois Davenport input -     Ambulatory referral to General Surgery  Over 30 minutes of exam, counseling, chart review, and critical decision making was performed  Future Appointments  Date Time Provider [place]  09/29/2022  2:00 PM Alycia Rossetti, NP [facility] None  06/13/2023  2:00 PM Alycia Rossetti, NP [facility] None     Plan:   During the course of the visit the patient was educated and counseled about appropriate screening and preventive services including:   Pneumococcal vaccine  Influenza vaccine Prevnar 13 Td vaccine Screening electrocardiogram Colorectal cancer screening Diabetes screening Glaucoma screening Nutrition counseling    Subjective:  Anthony Paul is a 66 y.o. male  who presents for Medicare Annual Wellness Visit and 3 month follow up for HTN, hyperlipidemia, glucose management and vitamin D Def.   He sees pain management for bilateral rib pain, some flank pain. Has tried lyrica/gabpentin without help, no help with  Cymbalta. Had normal AB [place] 11/2019, and GI visit, colonoscopy in 2021 and CT 02/2019 at [facility]. Had tried trigger points, failed the trial of the spinal stimulator.  Now on oxycodone by pain management.   Denies noable GI sx other than constipation (onset after oxycodone, well managed by [facility]). Also RLQ pain/tenderness persistent for 2+ years, worse with lifting or bearing down, hx of R inguinal hernia repair by Dr. Redmond Pulling in 2015 and interested in discussing with him.   He continues to smoke, started age 63, smoked a pack a day from 9 yr to 51 years old 40+ pack year history, but states he has decreased to less than a pack, COPD 0 per Dr. Melvyn Novas in 2019, is not on an maintenance inhaler.  He wants to quit smoking, he is going to pick a stop date.  Also talked about taper stop as well.  He has tried chantix in the past and did not tolerate this well.   He has a bicuspid aortic valve, follows with cardiology Dr. Johnsie Cancel, Echo 06/2020- EF normal mild AS and mild to moderate AR stable gradients- mild aortic root dilitation and stress test normal 09/2016.  He takes trazodone 150 mg night with modest benefit for insomnia.   He has a skin change growth  BMI is Body mass index is 31.27 kg/m., he has not been working on diet and exercise. Wt Readings from Last 3 Encounters:  06/09/22 237 lb (107.5 kg)  02/02/22 234 lb (106.1 kg)  01/05/22 232 lb (105.2 kg)   His blood pressure has been controlled at home, today their BP is BP: 110/72 He does not workout. He denies chest pain, shortness of breath, dizziness.   He has aortic atherosclerosis and ectaic aora per CT 09/2020.   He is on cholesterol medication (atorvastatin 10 mg daily) and denies myalgias. His cholesterol is not at goal. The cholesterol last visit was:   Lab Results  Component Value Date   CHOL 138 02/02/2022   HDL 31 (L) 02/02/2022   LDLCALC 76 02/02/2022   TRIG 216 (H) 02/02/2022   CHOLHDL 4.5 02/02/2022   He has not been  working on diet and exercise for glucose managment, and denies nausea, paresthesia of the feet, polydipsia, polyuria, and visual disturbances. Last A1C in the office was:  Lab Results  Component Value Date   HGBA1C 5.5 09/29/2021   Last GFR Lab Results  Component Value Date   GFRNONAA 67 06/09/2021   Patient is on Vitamin D supplement, taking 2000 IU daily  Lab Results  Component Value Date   VD25OH 62 09/29/2021     He is on finasteride 5 mg for BPH with weak stream has since resolved. He has ED but declined meds. Last PSA was: Lab Results  Component Value Date   PSA 0.24 09/29/2021      Medication Review:    Current Outpatient Medications (Cardiovascular):  .  atorvastatin (LIPITOR) 20 MG tablet, Take  1 tablet  Daily  for Cholesterol   Current Outpatient Medications (Respiratory):  .  albuterol (VENTOLIN HFA) 108 (90 Base) MCG/ACT inhaler, INHALE 2 PUFFS INTO THE LUNGS EVERY 4 (FOUR) HOURS AS NEEDED FOR WHEEZING OR SHORTNESS OF BREATH  Current Facility-Administered Medications (Respiratory):  .  ipratropium-albuterol (DUONEB) 0.5-2.5 (3) MG/3ML nebulizer solution 3 mL  Current Outpatient Medications (Analgesics):  .  acetaminophen (TYLENOL) 500 MG tablet, Take 1,500 mg by mouth every 6 (six) hours as needed for mild pain. Marland Kitchen  aspirin 81 MG tablet, Take 81 mg by mouth daily. Marland Kitchen  oxyCODONE-acetaminophen (PERCOCET) 10-325 MG tablet, Take 1 tablet by mouth as needed.   Current Outpatient Medications (Hematological):  Marland Kitchen  Cyanocobalamin (VITAMIN B12 PO), Take by mouth daily. (Patient not taking: Reported on 06/09/2022)   Current Outpatient Medications (Other):  Marland Kitchen  Cholecalciferol (VITAMIN D) 2000 UNITS CAPS, Take 1 capsule by mouth daily. Takes 500 mg .  Flaxseed, Linseed, (FLAXSEED OIL) 1200 MG CAPS, Take 1 capsule by mouth daily. Marland Kitchen  MOVANTIK 25 MG TABS tablet, TAKE 1 TABLET DAILY FOR REGULAR BM'S .  NARCAN 4 MG/0.1ML LIQD nasal spray kit,  .  Omega-3 Fatty Acids (FISH  OIL PO), Take 1 tablet by mouth daily. .  traZODone (DESYREL) 150 MG tablet, TAKE 1/2 TO 1 TABLET 1 HOUR BEFORE BEDTIME FOR SLEEP .  vitamin C (ASCORBIC ACID) 250 MG tablet, Take 250 mg by mouth daily. .  finasteride (PROSCAR) 5 MG tablet, TAKE 1 TABLET DAILY FOR PROSTATE (Patient not taking: Reported on 06/09/2022)  Current Facility-Administered Medications (Other):  .  0.9 %  sodium chloride infusion  Allergies: Allergies  Allergen Reactions  . Morphine Itching    Can take hydrocodone without issues    Current Problems (verified) has COPD GOLD 0 ; Bicuspid aortic valve; Mixed hyperlipidemia; Borderline systolic HTN; ED (erectile dysfunction); DJD (degenerative joint disease); Abnormal glucose; Vitamin D deficiency; Medication management; Chest pain, atypical most c/w ibs ; Cigarette smoker (70+ pack year current smoker); Chronic left shoulder pain; Congenital insufficiency of aortic valve; Aortic atherosclerosis ([place]) - CT 09/2020; History of adenomatous polyp of colon; Ectatic thoracic aorta ([place]) - CT 09/2020; Insomnia; and BPH (benign prostatic hyperplasia) on their problem list.  Screening Tests Immunization History  Administered Date(s) Administered  . Influenza Inj Mdck Quad With Preservative 10/24/2017, 10/31/2018  . Influenza Split 10/05/2015  . Influenza, High Dose Seasonal PF 11/25/2020, 09/29/2021  . Influenza,Quad,Nasal, Live 10/05/2015  . Influenza,inj,Quad PF,6+ Mos 11/15/2013, 10/16/2019  . Influenza,inj,quad, With Preservative 09/26/2016  . Influenza-Unspecified 12/19/2009  . PFIZER(Purple Top)SARS-COV-2 Vaccination 02/10/2020, 03/02/2020  . PPD Test 03/09/2015, 03/14/2016  . Pneumococcal Conjugate-13 11/15/2013  . Pneumococcal Polysaccharide-23 05/07/2020  . Pneumococcal-Unspecified 12/20/1995  . Td 12/19/2004  . Tdap 03/09/2015    Preventative care: Last colonoscopy: 12/2019, Dr. Havery Moros, 1 polyp, hx of precancerous polyps, 5 year follow up  CT chest:  09/2020 annual follow   Prior vaccinations: TD or Tdap: 2016  Influenza: 2022 Pneumococcal: 2021 Prevnar13: 2014 Shingles/Zostavax: will check about shingrix  Covid 19: 2/2 pfizer + booster - information requested  Names of Other Physician/Practitioners you currently use: 1. [facility] here for primary care 2. [facility], eye doctor, last visit 2022 3. Dr. Lanny Hurst, dentist, last visit 2022, q69m Patient Care Team: MUnk Pinto MD as PCP - General (Internal Medicine) NJosue Hector MD as PCP - Cardiology (Cardiology)  Surgical: He  has a past surgical history that includes Colon surgery (Jan 2012); Tonsillectomy; Hemorroidectomy; Colonoscopy (01/13/2014); Upper gi endoscopy (2015); Inguinal hernia repair (Right, 2015); Resection distal clavical (Right); and Upper gastrointestinal endoscopy. Family His family history includes Benign prostatic hyperplasia in his father; Cancer in his sister; Diabetes in his mother; Heart disease in his father; Kidney disease in his  sister. Social history  He reports that he has been smoking cigarettes. He started smoking about 50 years ago. He has a 70.50 pack-year smoking history. He quit smokeless tobacco use about 15 years ago.  His smokeless tobacco use included chew. He reports current alcohol use of about 5.0 standard drinks of alcohol per week. He reports that he does not use drugs.  MEDICARE WELLNESS OBJECTIVES: Physical activity:   Cardiac risk factors:   Depression/mood screen:      06/09/2021    3:00 PM  Depression screen PHQ 2/9  Decreased Interest 0  Down, Depressed, Hopeless 0  PHQ - 2 Score 0    ADLs:      No data to display            Cognitive Testing  Alert? Yes  Normal Appearance?Yes  Oriented to person? Yes  Place? Yes   Time? Yes  Recall of three objects?  Yes  Can perform simple calculations? Yes  Displays appropriate judgment?Yes  Can read the correct time from a  watch face?Yes  EOL planning:     Review of Systems  Constitutional:  Negative for malaise/fatigue and weight loss.  HENT:  Negative for hearing loss and tinnitus.   Eyes:  Negative for blurred vision and double vision.  Respiratory:  Negative for cough, sputum production, shortness of breath and wheezing.   Cardiovascular:  Negative for chest pain, palpitations, orthopnea, claudication, leg swelling and PND.  Gastrointestinal:  Positive for abdominal pain (chronic flank and RLQ). Negative for blood in stool, constipation (Improved with med, ongoing with oxycodone), diarrhea, heartburn, melena, nausea and vomiting.  Genitourinary: Negative.   Musculoskeletal:  Negative for falls, joint pain and myalgias.  Skin:  Negative for rash.  Neurological:  Negative for dizziness, tingling, sensory change, weakness and headaches.  Endo/Heme/Allergies:  Negative for polydipsia.  Psychiatric/Behavioral: Negative.  Negative for depression, memory loss, substance abuse and suicidal ideas. The patient is not nervous/anxious and does not have insomnia.   All other systems reviewed and are negative.     Objective:   Today's Vitals   06/09/22 1431  BP: 110/72  Pulse: 77  Temp: 98 F (36.7 C)  SpO2: 97%  Weight: 237 lb (107.5 kg)   Body mass index is 31.27 kg/m.  General appearance: alert, no distress, WD/WN, male HEENT: normocephalic, sclerae anicteric, TMs pearly, nares patent, no discharge or erythema, pharynx normal Oral cavity: MMM, no lesions Neck: supple, no lymphadenopathy, no thyromegaly, no masses Heart: RRR, normal S1, S2, no murmurs Lungs: CTA bilaterally, no wheezes, rhonchi, or rales Abdomen: +bs, soft, RLQ tenderness with subtle localized swelling without clear hearnia, non-erythematous,non distended, no masses, no hepatomegaly, no splenomegaly Musculoskeletal: nontender, no swelling, no obvious deformity Extremities: no edema, no cyanosis, no clubbing Pulses: 2+ symmetric,  upper and lower extremities, normal cap refill Neurological: alert, oriented x 3, CN2-12 intact, strength normal upper extremities and lower extremities, sensation normal throughout, DTRs 2+ throughout, no cerebellar signs, gait normal Psychiatric: normal affect, behavior normal, pleasant    Medicare Attestation I have personally reviewed: The patient's medical and social history Their use of alcohol, tobacco or illicit drugs Their current medications and supplements The patient's functional ability including ADLs,fall risks, home safety risks, cognitive, and hearing and visual impairment Diet and physical activities Evidence for depression or mood disorders  The patient's weight, height, BMI, and visual acuity have been recorded in the chart.  I have made referrals, counseling, and provided education to the patient based on  review of the above and I have provided the patient with a written personalized care plan for preventive services.     Darrol Jump, NP   06/09/2022

## 2022-06-17 ENCOUNTER — Ambulatory Visit
Admission: RE | Admit: 2022-06-17 | Discharge: 2022-06-17 | Disposition: A | Discharge status: Home / Self Care | Payer: Medicare Other | Source: Ambulatory Visit | Attending: Nurse Practitioner | Admitting: Nurse Practitioner

## 2022-06-17 DIAGNOSIS — M79671 Pain in right foot: Secondary | ICD-10-CM

## 2022-06-22 ENCOUNTER — Encounter: Payer: Self-pay | Admitting: Nurse Practitioner

## 2022-07-06 NOTE — Addendum Note (Signed)
Addended by: Darrol Jump on: 07/06/2022 04:48 PM   Modules accepted: Orders

## 2022-08-09 ENCOUNTER — Other Ambulatory Visit: Payer: Self-pay | Admitting: Nurse Practitioner

## 2022-09-09 IMAGING — CT CT CHEST LUNG CANCER SCREENING LOW DOSE W/O CM
1 of 2 series · 10 of 20 positions shown, 13 images · non-contrast
Comparison: Chest CT 05/28/2012.

CLINICAL DATA: 64-year-old male current smoker with 50 pack-year
history of smoking. Lung cancer screening examination.

EXAM:
CT CHEST WITHOUT CONTRAST LOW-DOSE FOR LUNG CANCER SCREENING
TECHNIQUE: Multidetector CT imaging of the chest was performed following the
standard protocol without IV contrast.

[ct lung segmentation data · axial · 0.80mm/px · z∈[-420,-420]mm · 10 of 328 frames shown]
[frame 1/328  mediastinal]
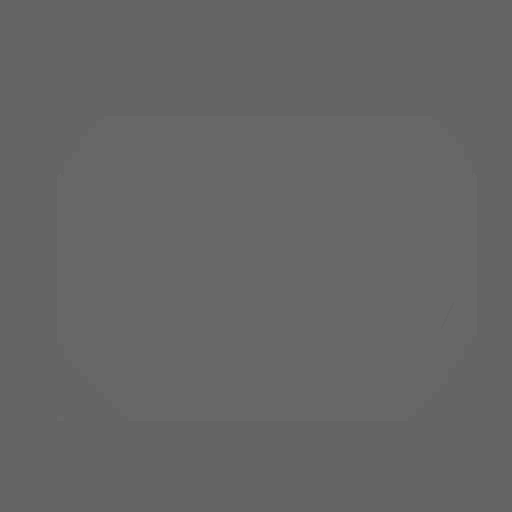
[frame 1/328  lung]
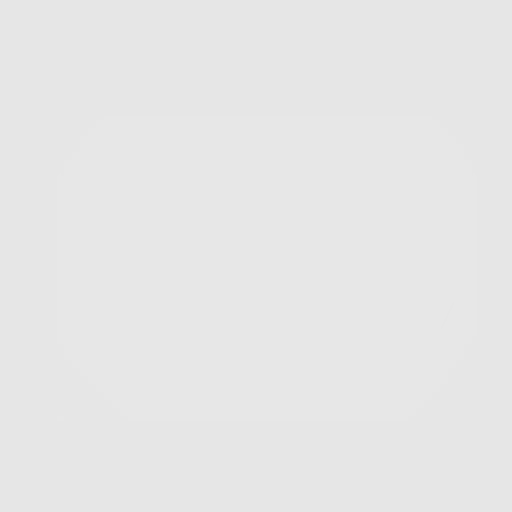
[frame 37/328  lung]
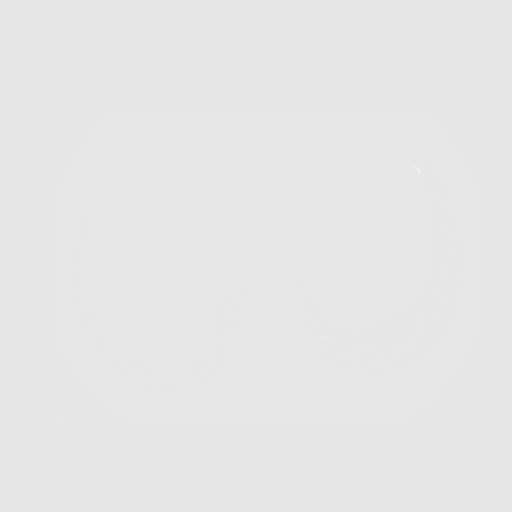
[frame 73/328  lung]
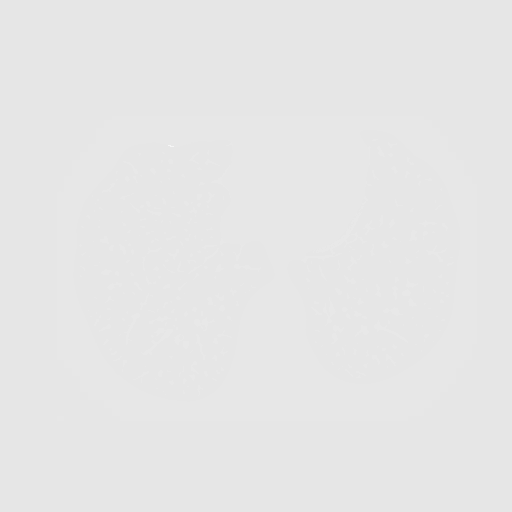
[frame 110/328  lung]
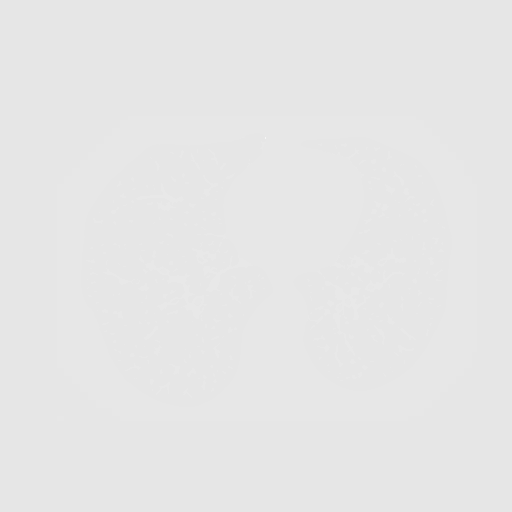
[frame 146/328  mediastinal]
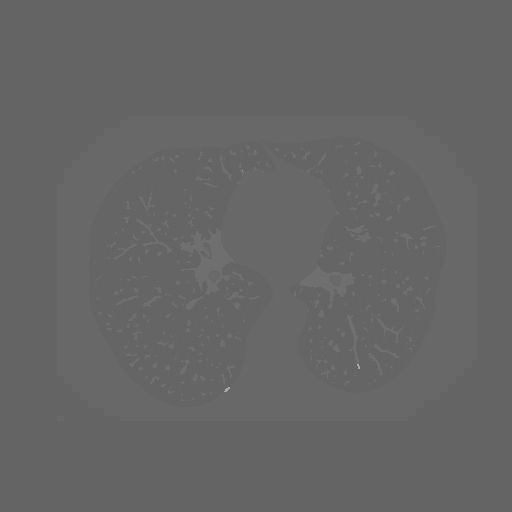
[frame 146/328  lung]
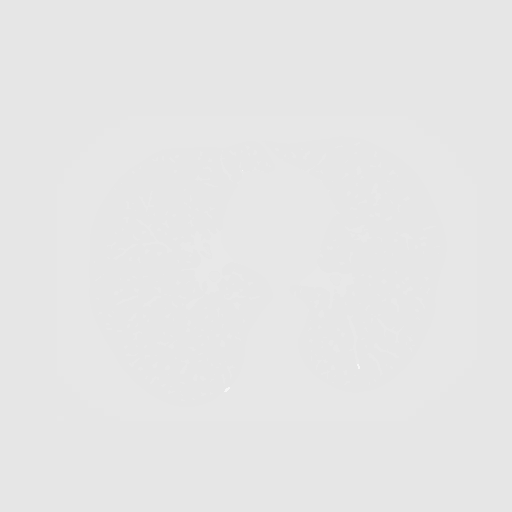
[frame 182/328  lung]
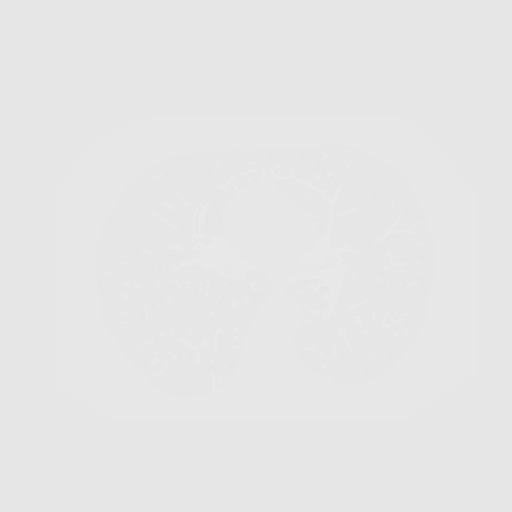
[frame 219/328  lung]
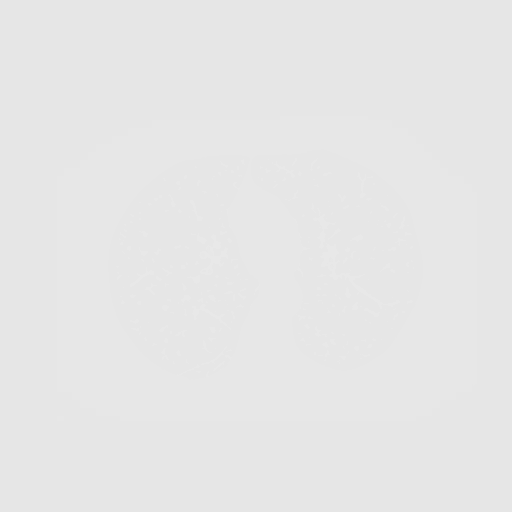
[frame 255/328  lung]
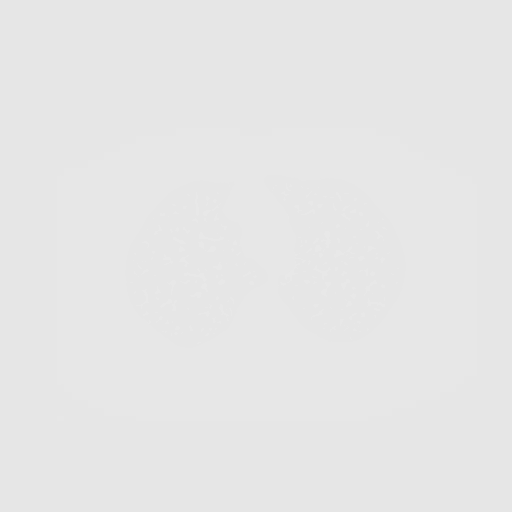
[frame 291/328  mediastinal]
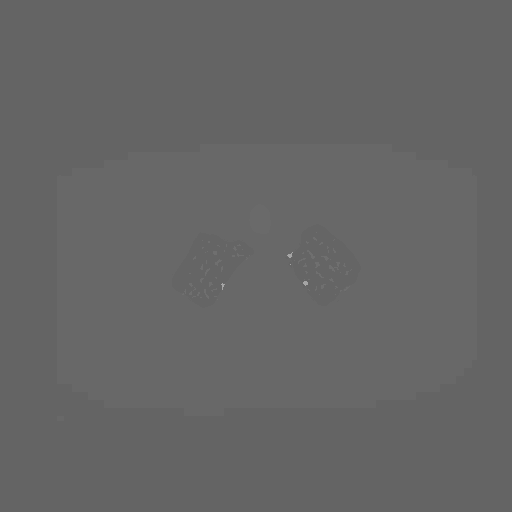
[frame 291/328  lung]
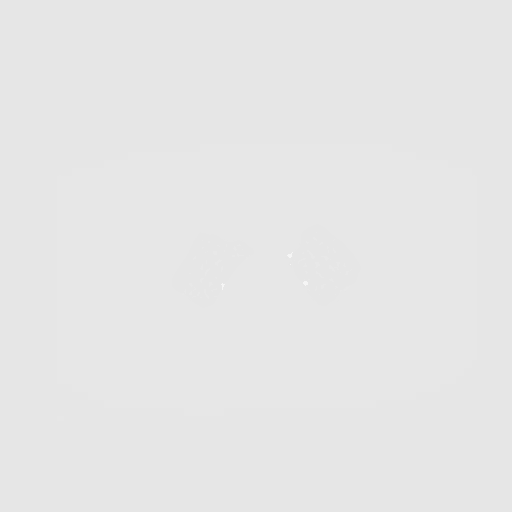
[frame 328/328  lung]
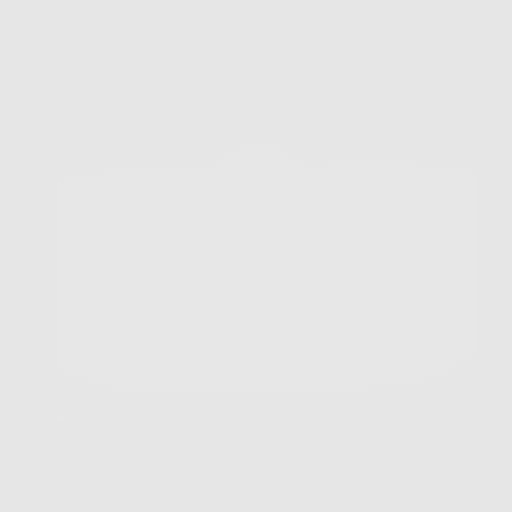

[10 of 20 positions shown; findings below may reference images not displayed]

FINDINGS: Cardiovascular: Heart size is normal. There is no significant
pericardial fluid, thickening or pericardial calcification. Aortic
atherosclerosis. No definite coronary artery calcifications. Ectasia
of ascending thoracic aorta (4.3 cm in diameter). Severe
calcifications of the aortic valve.

Mediastinum/Nodes: No pathologically enlarged mediastinal or hilar
lymph nodes. Please note that accurate exclusion of hilar adenopathy
is limited on noncontrast CT scans. Esophagus is unremarkable in
appearance. No axillary lymphadenopathy.

Lungs/Pleura: No suspicious appearing pulmonary nodules or masses
are noted. No acute consolidative airspace disease. No pleural
effusions. Mild diffuse bronchial wall thickening with very mild
centrilobular and paraseptal emphysema.

Upper Abdomen: Multiple small low-attenuation lesions scattered
throughout the hepatic parenchyma, incompletely characterized on
today's non-contrast CT examination, but statistically likely to
represent a tiny cyst.

Musculoskeletal: There are no aggressive appearing lytic or blastic
lesions noted in the visualized portions of the skeleton.
IMPRESSION: 1. Lung-RADS 1S, negative. Continue annual screening with low-dose
chest CT without contrast in 12 months.
2. Aortic atherosclerosis, with ectasia of ascending thoracic aorta
(4.3 cm in diameter). Attention at time of repeat annual low-dose
lung cancer screening chest CT is recommended to ensure continued
stability.
3. Mild diffuse bronchial wall thickening with mild centrilobular
and paraseptal emphysema; imaging findings suggestive of underlying
COPD.
4. There are severe calcifications of the aortic valve.
Echocardiographic correlation for evaluation of potential valvular
dysfunction may be warranted if clinically indicated.

Aortic Atherosclerosis (ZZ7DL-2SH.H) and Emphysema (ZZ7DL-[place]6.6).

## 2022-09-29 ENCOUNTER — Ambulatory Visit (INDEPENDENT_AMBULATORY_CARE_PROVIDER_SITE_OTHER): Payer: Medicare Other | Admitting: Nurse Practitioner

## 2022-09-29 VITALS — BP 110/72 | HR 73 | Temp 97.3°F | Ht 73.0 in | Wt 243.6 lb

## 2022-09-29 DIAGNOSIS — Z136 Encounter for screening for cardiovascular disorders: Secondary | ICD-10-CM | POA: Diagnosis not present

## 2022-09-29 DIAGNOSIS — Z1389 Encounter for screening for other disorder: Secondary | ICD-10-CM

## 2022-09-29 DIAGNOSIS — E559 Vitamin D deficiency, unspecified: Secondary | ICD-10-CM

## 2022-09-29 DIAGNOSIS — R239 Unspecified skin changes: Secondary | ICD-10-CM

## 2022-09-29 DIAGNOSIS — R7309 Other abnormal glucose: Secondary | ICD-10-CM

## 2022-09-29 DIAGNOSIS — I10 Essential (primary) hypertension: Secondary | ICD-10-CM | POA: Diagnosis not present

## 2022-09-29 DIAGNOSIS — Q231 Congenital insufficiency of aortic valve: Secondary | ICD-10-CM

## 2022-09-29 DIAGNOSIS — Z23 Encounter for immunization: Secondary | ICD-10-CM | POA: Diagnosis not present

## 2022-09-29 DIAGNOSIS — Z79899 Other long term (current) drug therapy: Secondary | ICD-10-CM

## 2022-09-29 DIAGNOSIS — I77810 Thoracic aortic ectasia: Secondary | ICD-10-CM

## 2022-09-29 DIAGNOSIS — Z1329 Encounter for screening for other suspected endocrine disorder: Secondary | ICD-10-CM

## 2022-09-29 DIAGNOSIS — Z86010 Personal history of colonic polyps: Secondary | ICD-10-CM

## 2022-09-29 DIAGNOSIS — I700 Atherosclerosis of aorta: Secondary | ICD-10-CM

## 2022-09-29 DIAGNOSIS — E782 Mixed hyperlipidemia: Secondary | ICD-10-CM

## 2022-09-29 DIAGNOSIS — M1990 Unspecified osteoarthritis, unspecified site: Secondary | ICD-10-CM

## 2022-09-29 DIAGNOSIS — Z0001 Encounter for general adult medical examination with abnormal findings: Secondary | ICD-10-CM

## 2022-09-29 DIAGNOSIS — M545 Low back pain, unspecified: Secondary | ICD-10-CM

## 2022-09-29 DIAGNOSIS — J449 Chronic obstructive pulmonary disease, unspecified: Secondary | ICD-10-CM

## 2022-09-29 DIAGNOSIS — N400 Enlarged prostate without lower urinary tract symptoms: Secondary | ICD-10-CM

## 2022-09-29 DIAGNOSIS — M79674 Pain in right toe(s): Secondary | ICD-10-CM

## 2022-09-29 DIAGNOSIS — N529 Male erectile dysfunction, unspecified: Secondary | ICD-10-CM

## 2022-09-29 MED ORDER — CYCLOBENZAPRINE HCL 10 MG PO TABS: ORAL_TABLET | ORAL | 0 refills | Status: DC

## 2022-09-29 MED ORDER — NICOTINE 21 MG/24HR TD PT24: 21.0000 mg | MEDICATED_PATCH | TRANSDERMAL | 0 refills | Status: AC

## 2022-09-29 NOTE — Progress Notes (Signed)
CPE Assessment:   Over 30 minutes of exam, counseling, chart review, and critical decision making was performed  General Medical Exam Due annually  Aortic atherosclerosis ([place]) - CT 09/2020 Control blood pressure, cholesterol, glucose, increase exercise.   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  Encourage smoking cessation Attention on annual low dose screening exams  Bicuspid aortic valve No concerning symptoms Continue to follow with Dr. Johnsie Cancel yearly  COPD GOLD 0  Continue Ventolin PRN If using >2x weekly contact office for step up approach to medication and further evaluation of COPD. Smoking cessation encouraged Discussed risks associated with tobacco use and advised to reduce or quit Continue annual screenings   Osteoarthritis, unspecified osteoarthritis type, unspecified site [place] for pain management Continue Percocet Rest when flared. Stay well hydrated. Narcan on hand.  Benign prostatic hyperplasia without lower urinary tract symptoms Controlled Continue Finasteride  Mixed hyperlipidemia Controlled Continue medications; Atorvastatin, Omega 3. Discussed lifestyle modifications. Recommended diet heavy in fruits and veggies, omega 3's. Decrease consumption of animal meats, cheeses, and dairy products. Remain active and exercise as tolerated. Continue to monitor.  Erectile dysfunction, unspecified erectile dysfunction type Declines medication.  Vitamin D deficiency Continue supplement. Monitor levels  Abnormal glucose Education: Reviewed 'ABCs' of diabetes management  Discussed goals to be met and/or maintained include A1C (<7) Blood pressure (<130/80) Cholesterol (LDL <70) Continue Eye Exam yearly  Continue Dental Exam Q6 mo Discussed dietary recommendations Discussed Physical Activity recommendations Check and monitor A1C  History of adenomatous polyp of  colon Continue intake of fiber Colonoscopy UTD  Skin change Recently treated. Continue to follow with Dermatology  Pain, great toe, right Check uric acid levels Discuss low purine diet  Medication management All medications discussed and reviewed in full. All questions and concerns regarding medications addressed.    Screening for ischemic heart disease EKG  Screening for hematuria or proteinuria UA  Screening for thyroid disorder TSH  Low back pain Start cyclobenzaprine PRN Alternate Ice/Heat Rest Discuss with orthopedics if s/s fail to improve.  Need for flu vaccine Administered     Orders Placed This Encounter  Procedures   Flu vaccine HIGH DOSE PF   CBC with Differential/Platelet   COMPLETE METABOLIC PANEL WITH GFR   Magnesium   Lipid panel   TSH   Hemoglobin A1c   Insulin, random   VITAMIN D 25 Hydroxy (Vit-D Deficiency, Fractures)   Urinalysis, Routine w reflex microscopic   Microalbumin / creatinine urine ratio   PSA   Uric acid   EKG 12-Lead    Notify office for further evaluation and treatment, questions or concerns if any reported s/s fail to improve.   The patient was advised to call back or seek an in-person evaluation if any symptoms worsen or if the condition fails to improve as anticipated.   Further disposition pending results of labs. Discussed med's effects and SE's.    I discussed the assessment and treatment plan with the patient. The patient was provided an opportunity to ask questions and all were answered. The patient agreed with the plan and demonstrated an understanding of the instructions.  Discussed med's effects and SE's. Screening labs and tests as requested with regular follow-up as recommended.  I provided 30 minutes of face-to-face time during this encounter including counseling, chart review, and critical decision making was preformed.   Future Appointments  Date Time Provider [place]  06/13/2023  2:00 PM  Alycia Rossetti, NP [facility]  None  10/31/2023  2:00 PM Ayodele Sangalang, Kenney Houseman, NP [facility] None     Subjective:  Anthony Paul is a 66 y.o. male who presents for Medicare Annual Wellness Visit and 3 month follow up for HTN, hyperlipidemia, glucose management and vitamin D Def.   He has recently seen Dermatology for left ear skin changes.  Cryotherapy completed.  He is to f/u up PRN.  He sees pain management for bilateral rib pain, some flank pain. Has new onset right foot pain without any recent fall, trauma, injury.  Has tried lyrica/gabpentin without help, no help with Cymbalta.  Currently on Percocet. Had tried trigger points, failed the trial of the spinal stimulator. Continues pain management.   He has noticed increase in low back pain,  reports he may have slept wrong.  Has pain with bending, twisting, unable to sleep. Denies any recent injury, fall, trauma.  Reports occasional constipation (onset after oxycodone, well managed by [facility]). Also RLQ pain/tenderness persistent for 2+ years, worse with lifting or bearing down, hx of R inguinal hernia repair by Dr. Redmond Pulling in 2015.   Had normal AB [place] 11/2019, and GI visit, colonoscopy in 2021 and CT 02/2019 at [facility].   He continues to smoke, started age 29, smoked a pack a day from 41 yr to 27 years old 40+ pack year history, but states he has decreased to less than a pack, COPD 0 per Dr. Melvyn Novas in 2019, is not on an maintenance inhaler.  He has tried chantix in the past and did not tolerate this well. He is willing to try  nicotine patches.  He has a bicuspid aortic valve, follows with cardiology Dr. Johnsie Cancel, Echo 06/2020- EF normal mild AS and mild to moderate AR stable gradients- mild aortic root dilitation and stress test normal 09/2016.  He takes trazodone 150 mg night with modest benefit for insomnia.   BMI is Body mass index is 32.14 kg/m., he has not been working on diet and exercise. Wt Readings from Last 3 Encounters:  09/29/22  243 lb 9.6 oz (110.5 kg)  06/09/22 237 lb (107.5 kg)  02/02/22 234 lb (106.1 kg)   His blood pressure has been controlled at home, today their BP is BP: 110/72 He does not workout. He denies chest pain, shortness of breath, dizziness.   He has aortic atherosclerosis and ectaic aora per CT 09/2020.   He is on cholesterol medication (atorvastatin 10 mg daily) and denies myalgias. His cholesterol is not at goal. The cholesterol last visit was:   Lab Results  Component Value Date   CHOL 138 09/29/2022   HDL 29 (L) 09/29/2022   LDLCALC 71 09/29/2022   TRIG 292 (H) 09/29/2022   CHOLHDL 4.8 09/29/2022   He has not been working on diet and exercise for glucose managment, and denies nausea, paresthesia of the feet, polydipsia, polyuria, and visual disturbances. Last A1C in the office was:  Lab Results  Component Value Date   HGBA1C 5.8 (H) 09/29/2022   Last GFR Lab Results  Component Value Date   GFRNONAA 67 06/09/2021   Patient is on Vitamin D supplement, taking 2000 IU daily  Lab Results  Component Value Date   VD25OH 62 09/29/2022     He is on finasteride 5 mg for BPH with weak stream has since resolved. He has ED but declined meds. Last PSA was: Lab Results  Component Value Date   PSA 0.47 09/29/2022    Medication Review:    Current Outpatient Medications (  Cardiovascular):    atorvastatin (LIPITOR) 20 MG tablet, Take  1 tablet  Daily  for Cholesterol   Current Outpatient Medications (Respiratory):    albuterol (VENTOLIN HFA) 108 (90 Base) MCG/ACT inhaler, INHALE 2 PUFFS INTO THE LUNGS EVERY 4 (FOUR) HOURS AS NEEDED FOR WHEEZING OR SHORTNESS OF BREATH  Current Facility-Administered Medications (Respiratory):    ipratropium-albuterol (DUONEB) 0.5-2.5 (3) MG/3ML nebulizer solution 3 mL  Current Outpatient Medications (Analgesics):    acetaminophen (TYLENOL) 500 MG tablet, Take 1,500 mg by mouth every 6 (six) hours as needed for mild pain.   aspirin 81 MG tablet, Take 81  mg by mouth daily.   oxyCODONE-acetaminophen (PERCOCET) 10-325 MG tablet, Take 1 tablet by mouth as needed.   Current Outpatient Medications (Hematological):    Cyanocobalamin (VITAMIN B12 PO), Take by mouth daily. (Patient not taking: Reported on 06/09/2022)   Current Outpatient Medications (Other):    Cholecalciferol (VITAMIN D) 2000 UNITS CAPS, Take 1 capsule by mouth daily. Takes 500 mg   cyclobenzaprine (FLEXERIL) 10 MG tablet, Take 1/2 to 1 tablet 3 x /day as needed for Muscle Spasm   Flaxseed, Linseed, (FLAXSEED OIL) 1200 MG CAPS, Take 1 capsule by mouth daily.   MOVANTIK 25 MG TABS tablet, TAKE 1 TABLET DAILY FOR REGULAR BM'S   NARCAN 4 MG/0.1ML LIQD nasal spray kit,    nicotine (NICODERM CQ - DOSED IN MG/24 HOURS) 21 mg/24hr patch, Place 1 patch (21 mg total) onto the skin daily.   Omega-3 Fatty Acids (FISH OIL PO), Take 1 tablet by mouth daily.   traZODone (DESYREL) 150 MG tablet, TAKE 1/2 TO 1 TABLET 1 HOUR BEFORE BEDTIME FOR SLEEP   vitamin C (ASCORBIC ACID) 250 MG tablet, Take 250 mg by mouth daily.   finasteride (PROSCAR) 5 MG tablet, TAKE 1 TABLET DAILY FOR PROSTATE (Patient not taking: Reported on 06/09/2022)  Current Facility-Administered Medications (Other):    0.9 %  sodium chloride infusion  Allergies: Allergies  Allergen Reactions   Morphine Itching    Can take hydrocodone without issues    Current Problems (verified) has COPD GOLD 0 ; Bicuspid aortic valve; Mixed hyperlipidemia; Borderline systolic HTN; ED (erectile dysfunction); DJD (degenerative joint disease); Abnormal glucose; Vitamin D deficiency; Medication management; Chest pain, atypical most c/w ibs ; Cigarette smoker (70+ pack year current smoker); Chronic left shoulder pain; Congenital insufficiency of aortic valve; Aortic atherosclerosis ([place]) - CT 09/2020; History of adenomatous polyp of colon; 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  Screening Tests Immunization History  Administered Date(s) Administered   Influenza Inj Mdck Quad With Preservative 10/24/2017, 10/31/2018   Influenza Split 10/05/2015   Influenza, High Dose Seasonal PF 11/25/2020, 09/29/2021, 09/29/2022   Influenza,Quad,Nasal, Live 10/05/2015   Influenza,inj,Quad PF,6+ Mos 11/15/2013, 10/16/2019   Influenza,inj,quad, With Preservative 09/26/2016   Influenza-Unspecified 12/19/2009   PFIZER(Purple Top)SARS-COV-2 Vaccination 02/10/2020, 03/02/2020   PPD Test 03/09/2015, 03/14/2016   Pneumococcal Conjugate-13 11/15/2013   Pneumococcal Polysaccharide-23 05/07/2020   Pneumococcal-Unspecified 12/20/1995   Td 12/19/2004   Tdap 03/09/2015    Preventative care: Last colonoscopy: 12/2019, Dr. Havery Moros, 1 polyp, hx of precancerous polyps, 5 year follow up. Due 2026 CT chest: 09/2020 annual follow - Due   Prior vaccinations: TD or Tdap: 2016  Influenza: Administered today 09/2022  Pneumococcal: 2021 Prevnar13: 2014 Shingles/Zostavax: will check about shingrix  Covid 19: 2/2 pfizer + booster - information requested  Names of Other Physician/Practitioners you currently use: 1. [facility] here for primary care 2. [facility], eye doctor, last visit 2023 Has corrective lenses. 3. Dr. Lanny Hurst, dentist, last visit 2023, q32m4.  [place] -  5.  Hearing - 09/2022 Has ne hearing aides  Patient Care Team: MUnk Pinto MD as PCP - General (Internal Medicine) NJosue Hector MD as PCP - Cardiology (Cardiology)  Surgical: He  has a past surgical history that includes Colon surgery (Jan 2012); Tonsillectomy; Hemorroidectomy; Colonoscopy (01/13/2014); Upper gi endoscopy (2015); Inguinal hernia repair (Right, 2015); Resection distal clavical (Right); and Upper gastrointestinal endoscopy. Family His family history includes Benign prostatic hyperplasia in his father; Cancer in his sister;  Diabetes in his mother; Heart disease in his father; Kidney disease in his sister. Social history  He reports that he has been smoking cigarettes. He started smoking about 50 years ago. He has a 70.50 pack-year smoking history. He quit smokeless tobacco use about 15 years ago.  His smokeless tobacco use included chew. He reports current alcohol use of about 5.0 standard drinks of alcohol per week. He reports that he does not use drugs.    Review of Systems  Constitutional:  Negative for malaise/fatigue and weight loss.  HENT:  Negative for hearing loss and tinnitus.   Eyes:  Negative for blurred vision and double vision.  Respiratory:  Negative for cough, sputum production, shortness of breath and wheezing.   Cardiovascular:  Negative for chest pain, palpitations, orthopnea, claudication, leg swelling and PND.  Gastrointestinal:  Negative for abdominal pain, blood in stool, constipation (Improved with med, ongoing with oxycodone), diarrhea, heartburn, melena, nausea and vomiting.  Genitourinary: Negative.   Musculoskeletal:  Negative for falls, joint pain and myalgias.  Skin:  Negative for rash.  Neurological:  Negative for dizziness, tingling, sensory change, weakness and headaches.  Endo/Heme/Allergies:  Negative for polydipsia.  Psychiatric/Behavioral: Negative.  Negative for depression, memory loss, substance abuse and suicidal ideas. The patient is not nervous/anxious and does not have insomnia.   All other systems reviewed and are negative.     Objective:   Today's Vitals   09/29/22 1402  BP: 110/72  Pulse: 73  Temp: (!) 97.3 F (36.3 C)  SpO2: 97%  Weight: 243 lb 9.6 oz (110.5 kg)  Height: 6' 1"  (1.854 m)   Body mass index is 32.14 kg/m.  General appearance: alert, no distress, WD/WN, male HEENT: normocephalic, sclerae anicteric, TMs pearly, nares patent, no discharge or erythema, pharynx normal Oral cavity: MMM, no lesions Neck: supple, no lymphadenopathy, no  thyromegaly, no masses Heart: RRR, normal S1, S2, no murmurs Lungs: CTA bilaterally, no wheezes, rhonchi, or rales Abdomen: +bs, soft, RLQ tenderness with subtle localized swelling without clear hearnia, non-erythematous,non distended, no masses, no hepatomegaly, no splenomegaly Musculoskeletal: Low back with LROM d/t pain.  Tenderness to palpation along L5/S1. Extremities: no edema, no cyanosis, no clubbing Pulses: 2+ symmetric, upper and lower extremities, normal cap refill Neurological: alert, oriented x 3, CN2-12 intact, strength normal upper extremities and lower extremities, sensation normal throughout, DTRs 2+ throughout, no cerebellar signs, gait normal Psychiatric: normal affect, behavior normal, pleasant  Skin:  Warm, appropriate color for ethnicity.  No rashes, bruising, streaking.  EKG:  No changes.  TDarrol Jump NP   10/03/2022

## 2022-09-30 LAB — CBC WITH DIFFERENTIAL/PLATELET
Absolute Monocytes: 580 cells/uL (ref 200–950)
Basophils Absolute: 32 cells/uL (ref 0–200)
Basophils Relative: 0.5 %
Eosinophils Absolute: 82 cells/uL (ref 15–500)
Eosinophils Relative: 1.3 %
HCT: 40.3 % (ref 38.5–50.0)
Hemoglobin: 14.1 g/dL (ref 13.2–17.1)
Lymphs Abs: 1399 cells/uL (ref 850–3900)
MCH: 32.5 pg (ref 27.0–33.0)
MCHC: 35 g/dL (ref 32.0–36.0)
MCV: 92.9 fL (ref 80.0–100.0)
MPV: 11.9 fL (ref 7.5–12.5)
Monocytes Relative: 9.2 %
Neutro Abs: 4208 cells/uL (ref 1500–7800)
Neutrophils Relative %: 66.8 %
Platelets: 192 10*3/uL (ref 140–400)
RBC: 4.34 10*6/uL (ref 4.20–5.80)
RDW: 11.8 % (ref 11.0–15.0)
Total Lymphocyte: 22.2 %
WBC: 6.3 10*3/uL (ref 3.8–10.8)

## 2022-09-30 LAB — HEMOGLOBIN A1C
Hgb A1c MFr Bld: 5.8 % of total Hgb — ABNORMAL HIGH (ref <5.7)
Mean Plasma Glucose: 120 mg/dL
eAG (mmol/L): 6.6 mmol/L

## 2022-09-30 LAB — INSULIN, RANDOM: Insulin: 42.1 u[IU]/mL — ABNORMAL HIGH

## 2022-09-30 LAB — COMPLETE METABOLIC PANEL WITH GFR
AG Ratio: 1.9 (calc) (ref 1.0–2.5)
ALT: 27 U/L (ref 9–46)
AST: 19 U/L (ref 10–35)
Albumin: 4.1 g/dL (ref 3.6–5.1)
Alkaline phosphatase (APISO): 57 U/L (ref 35–144)
BUN: 18 mg/dL (ref 7–25)
CO2: 27 mmol/L (ref 20–32)
Calcium: 9.6 mg/dL (ref 8.6–10.3)
Chloride: 107 mmol/L (ref 98–110)
Creat: 1.23 mg/dL (ref 0.70–1.35)
Globulin: 2.2 g/dL (calc) (ref 1.9–3.7)
Glucose, Bld: 93 mg/dL (ref 65–99)
Potassium: 5 mmol/L (ref 3.5–5.3)
Sodium: 141 mmol/L (ref 135–146)
Total Bilirubin: 0.3 mg/dL (ref 0.2–1.2)
Total Protein: 6.3 g/dL (ref 6.1–8.1)
eGFR: 64 mL/min/{1.73_m2} (ref >=60)

## 2022-09-30 LAB — URINALYSIS, ROUTINE W REFLEX MICROSCOPIC
Bilirubin Urine: NEGATIVE
Glucose, UA: NEGATIVE
Hgb urine dipstick: NEGATIVE
Ketones, ur: NEGATIVE
Leukocytes,Ua: NEGATIVE
Nitrite: NEGATIVE
Protein, ur: NEGATIVE
Specific Gravity, Urine: 1.025 (ref 1.001–1.035)
pH: 7 (ref 5.0–8.0)

## 2022-09-30 LAB — VITAMIN D 25 HYDROXY (VIT D DEFICIENCY, FRACTURES): Vit D, 25-Hydroxy: 57 ng/mL (ref 30–100)

## 2022-09-30 LAB — LIPID PANEL
Cholesterol: 138 mg/dL (ref <200)
HDL: 29 mg/dL — ABNORMAL LOW (ref >=40)
LDL Cholesterol (Calc): 71 mg/dL (calc)
Non-HDL Cholesterol (Calc): 109 mg/dL (calc) (ref <130)
Total CHOL/HDL Ratio: 4.8 (calc) (ref <5.0)
Triglycerides: 292 mg/dL — ABNORMAL HIGH (ref <150)

## 2022-09-30 LAB — TSH: TSH: 1.82 mIU/L (ref 0.40–4.50)

## 2022-09-30 LAB — MICROALBUMIN / CREATININE URINE RATIO
Creatinine, Urine: 188 mg/dL (ref 20–320)
Microalb Creat Ratio: 2 mcg/mg creat (ref <30)
Microalb, Ur: 0.3 mg/dL

## 2022-09-30 LAB — MAGNESIUM: Magnesium: 2.1 mg/dL (ref 1.5–2.5)

## 2022-09-30 LAB — PSA: PSA: 0.47 ng/mL (ref <=4.00)

## 2022-09-30 LAB — URIC ACID: Uric Acid, Serum: 5.6 mg/dL (ref 4.0–8.0)

## 2022-11-24 ENCOUNTER — Other Ambulatory Visit: Payer: Self-pay | Admitting: Nurse Practitioner

## 2022-11-24 ENCOUNTER — Encounter: Payer: Self-pay | Admitting: Nurse Practitioner

## 2022-11-24 DIAGNOSIS — Z79899 Other long term (current) drug therapy: Secondary | ICD-10-CM

## 2022-11-24 MED ORDER — CYCLOBENZAPRINE HCL 10 MG PO TABS: ORAL_TABLET | ORAL | 0 refills | Status: DC

## 2023-01-11 ENCOUNTER — Telehealth: Payer: Self-pay | Admitting: Cardiovascular Disease

## 2023-01-11 DIAGNOSIS — I350 Nonrheumatic aortic (valve) stenosis: Secondary | ICD-10-CM

## 2023-01-11 NOTE — Telephone Encounter (Signed)
Patient has appt scheduled for 2/21.  There was a note with his recall that patient needs an echo done same day as appt, however there was no order in epic for the echo.  If patient needs echo can an order be placed.  Thank you.

## 2023-01-11 NOTE — Telephone Encounter (Signed)
Spoke with patient he is scheduled for Echo on 01/31/23, patient voiced understanding.

## 2023-01-31 ENCOUNTER — Ambulatory Visit (HOSPITAL_COMMUNITY): Payer: Medicare Other | Attending: Cardiovascular Disease

## 2023-01-31 DIAGNOSIS — I350 Nonrheumatic aortic (valve) stenosis: Secondary | ICD-10-CM | POA: Diagnosis present

## 2023-01-31 LAB — ECHOCARDIOGRAM COMPLETE
AV Mean grad: 27 mmHg
AV Peak grad: 47.3 mmHg
Ao pk vel: 3.44 m/s
Area-P 1/2: 4.13 cm2
P 1/2 time: 340 msec
S' Lateral: 3.4 cm

## 2023-02-01 ENCOUNTER — Other Ambulatory Visit: Payer: Self-pay | Admitting: Nurse Practitioner

## 2023-02-08 ENCOUNTER — Encounter: Payer: Self-pay | Admitting: Physician Assistant

## 2023-02-08 ENCOUNTER — Ambulatory Visit: Payer: Medicare Other | Attending: Physician Assistant | Admitting: Physician Assistant

## 2023-02-08 VITALS — BP 114/72 | HR 80 | Ht 73.0 in | Wt 235.0 lb

## 2023-02-08 DIAGNOSIS — I350 Nonrheumatic aortic (valve) stenosis: Secondary | ICD-10-CM | POA: Diagnosis not present

## 2023-02-08 DIAGNOSIS — E785 Hyperlipidemia, unspecified: Secondary | ICD-10-CM | POA: Insufficient documentation

## 2023-02-08 DIAGNOSIS — I351 Nonrheumatic aortic (valve) insufficiency: Secondary | ICD-10-CM | POA: Diagnosis not present

## 2023-02-08 MED ORDER — ATORVASTATIN CALCIUM 40 MG PO TABS: 40.0000 mg | ORAL_TABLET | Freq: Every day | ORAL | 3 refills | Status: DC

## 2023-02-08 NOTE — Progress Notes (Addendum)
Office Visit    Patient Name: Anthony Paul Date of Encounter: 02/08/2023  PCP:  Unk Pinto, [facility]  Cardiologist:  Jenkins Rouge, MD  Advanced Practice Provider:  No care team member to display Electrophysiologist:  None   HPI    Anthony Paul is a 67 y.o. male with a past medical history of bicuspid aortic valve, aortic stenosis, and hyperlipidemia presents today for annual follow-up.  Patient has a history of a murmur found by his primary in 2014 and diagnosed with bicuspid aortic valve.  He is a previous smoker.  MRI 2013 with no coarctation and ascending aorta 3.9 cm.  Echocardiogram 07/14/2020 with LVEF 60 to 65%, mild AR, moderate AAS, mean gradient 20 mmHg, peak 32 mmHg, DVI 0.42 AVA 1.5 cm.  He is a retired Geophysicist/field seismologist for [employer].  He is active with no dyspnea, palpitations, or syncope.  Cardiac CT done 12/07/2018 with functionally bicuspid AV with partial fusion of left and right cusps.  Aortic root 3.8 cm, no coarctation, normal right dominant coronary arteries, calcium score not done.  He was last seen in January 2023 and he was not having any cardiovascular complaints at that time.  He was playing lots of poker in [place].  Retired from the [employer] which she was in for 40 years.  Denied any shortness of breath, lower extremity edema.  Most recent echocardiogram 02/01/2023 showed moderate AR/AS and it was recommended to follow-up with an echo in a year.  Today, he states that he has not had any cardiac symptoms. He does not get much exercise and he drinks a few days a week when he plays poker. He eats whatever his wife makes for dinner and likes meat. He is on Lipitor and flaxseed oil but triglycerides are 292. We also discussed his echo from a few days ago which was unchanged other than his aorta which is 46m compared to 42mon previous study. We discussed ordering a CTA.   Reports no shortness of breath nor dyspnea on exertion.  Reports no chest pain, pressure, or tightness. No edema, orthopnea, PND. Reports no palpitations.    Past Medical History    Past Medical History:  Diagnosis Date   Abnormal CT scan, chest    Blebs on CT chest but NORMAL PFTs- no COPD   Aortic stenosis    Bicuspid aortic valve    Echo 2014  EF 55-60% Mild to moderate AR mild AS with mean gradient 18 mmHg and peak of 25 mmHg. ? Bicuspid valve with dilated aortic root 4.0 cm.   diverticulosis    DJD (degenerative joint disease)    ED (erectile dysfunction)    Elevated blood pressure reading without diagnosis of hypertension    Heart murmur    Hx of colonic polyp 2015   Hypercholesteremia    Past Surgical History:  Procedure Laterality Date   COLON SURGERY  Jan 2012   COLONOSCOPY  01/13/2014   Dr. MeEarlean Shawldue every 5-7 years   HEMORROIDECTOMY     INGUINAL HERNIA REPAIR Right 2015   Dr. ErGreer Pickerel RESECTION DISTAL CLAVICAL Right    TONSILLECTOMY     UPPER GASTROINTESTINAL ENDOSCOPY     UPPER GI ENDOSCOPY  2015   Dr. MeEarlean Shawl  Allergies  Allergies  Allergen Reactions   Morphine Itching    Can take hydrocodone without issues    EKGs/Labs/Other Studies Reviewed:   The following studies were  reviewed today:  Echo 02/01/23 IMPRESSIONS     1. Left ventricular ejection fraction, by estimation, is 65 to 70%. The  left ventricle has normal function. The left ventricle has no regional  wall motion abnormalities. Left ventricular diastolic parameters are  consistent with Grade II diastolic  dysfunction (pseudonormalization).   2. Right ventricular systolic function is normal. The right ventricular  size is normal.   3. The mitral valve is normal in structure. Trivial mitral valve  regurgitation. No evidence of mitral stenosis.   4. The aortic valve is tricuspid. There is moderate calcification of the  aortic valve. There is moderate thickening of the aortic valve. Aortic  valve regurgitation is moderate. Moderate  aortic valve stenosis. Aortic  regurgitation PHT measures 340 msec.  Aortic valve mean gradient measures 27.0 mmHg. Aortic valve Vmax measures  3.44 m/s.   5. Aortic dilatation noted. There is moderate dilatation of the ascending  aorta, measuring 45 mm.   6. The inferior vena cava is normal in size with greater than 50%  respiratory variability, suggesting right atrial pressure of 3 mmHg.   FINDINGS   Left Ventricle: Left ventricular ejection fraction, by estimation, is 65  to 70%. The left ventricle has normal function. The left ventricle has no  regional wall motion abnormalities. The left ventricular internal cavity  size was normal in size. There is   no left ventricular hypertrophy. Left ventricular diastolic parameters  are consistent with Grade II diastolic dysfunction (pseudonormalization).   Right Ventricle: The right ventricular size is normal. No increase in  right ventricular wall thickness. Right ventricular systolic function is  normal.   Left Atrium: Left atrial size was normal in size.   Right Atrium: Right atrial size was normal in size.   Pericardium: There is no evidence of pericardial effusion.   Mitral Valve: The mitral valve is normal in structure. Trivial mitral  valve regurgitation. No evidence of mitral valve stenosis.   Tricuspid Valve: The tricuspid valve is normal in structure. Tricuspid  valve regurgitation is not demonstrated. No evidence of tricuspid  stenosis.   Aortic Valve: The aortic valve is tricuspid. There is moderate  calcification of the aortic valve. There is moderate thickening of the  aortic valve. Aortic valve regurgitation is moderate. Aortic regurgitation  PHT measures 340 msec. Moderate aortic  stenosis is present. Aortic valve mean gradient measures 27.0 mmHg. Aortic  valve peak gradient measures 47.3 mmHg.   Pulmonic Valve: The pulmonic valve was normal in structure. Pulmonic valve  regurgitation is not visualized. No  evidence of pulmonic stenosis.   Aorta: Aortic dilatation noted. There is moderate dilatation of the  ascending aorta, measuring 45 mm.   Venous: The inferior vena cava is normal in size with greater than 50%  respiratory variability, suggesting right atrial pressure of 3 mmHg.   IAS/Shunts: No atrial level shunt detected by color flow Doppler.       EKG:  EKG is not ordered today.    Recent Labs: 09/29/2022: ALT 27; BUN 18; Creat 1.23; Hemoglobin 14.1; Magnesium 2.1; Platelets 192; Potassium 5.0; Sodium 141; TSH 1.82  Recent Lipid Panel    Component Value Date/Time   CHOL 138 09/29/2022 1502   TRIG 292 (H) 09/29/2022 1502   HDL 29 (L) 09/29/2022 1502   CHOLHDL 4.8 09/29/2022 1502   VLDL 33 (H) 04/11/2017 1535   LDLCALC 71 09/29/2022 1502     Home Medications   Current Meds  Medication Sig   acetaminophen (TYLENOL)  500 MG tablet Take 1,500 mg by mouth every 6 (six) hours as needed for mild pain.   albuterol (VENTOLIN HFA) 108 (90 Base) MCG/ACT inhaler INHALE 2 PUFFS INTO THE LUNGS EVERY 4 (FOUR) HOURS AS NEEDED FOR WHEEZING OR SHORTNESS OF BREATH   aspirin 81 MG tablet Take 81 mg by mouth daily.   celecoxib (CELEBREX) 200 MG capsule Take 200 mg by mouth daily.   Cholecalciferol (VITAMIN D) 2000 UNITS CAPS Take 1 capsule by mouth daily. Takes 500 mg   Cyanocobalamin (VITAMIN B12 PO) Take by mouth daily.   cyclobenzaprine (FLEXERIL) 10 MG tablet Take 1/2 to 1 tablet 3 x /day as needed for Muscle Spasm   finasteride (PROSCAR) 5 MG tablet TAKE 1 TABLET DAILY FOR PROSTATE   Flaxseed, Linseed, (FLAXSEED OIL) 1200 MG CAPS Take 1 capsule by mouth daily.   MOVANTIK 25 MG TABS tablet TAKE 1 TABLET DAILY FOR REGULAR BM'S   NARCAN 4 MG/0.1ML LIQD nasal spray kit    Omega-3 Fatty Acids (FISH OIL PO) Take 1 tablet by mouth daily.   oxyCODONE-acetaminophen (PERCOCET) 10-325 MG tablet Take 1 tablet by mouth as needed.   traZODone (DESYREL) 150 MG tablet TAKE 1/2 TO 1 TABLET 1 HOUR BEFORE  BEDTIME FOR SLEEP   vitamin C (ASCORBIC ACID) 250 MG tablet Take 250 mg by mouth daily.   [DISCONTINUED] atorvastatin (LIPITOR) 20 MG tablet Take  1 tablet  Daily  for Cholesterol   Current Facility-Administered Medications for the 02/08/23 encounter (Office Visit) with Elgie Collard, PA-C  Medication   0.9 %  sodium chloride infusion   ipratropium-albuterol (DUONEB) 0.5-2.5 (3) MG/3ML nebulizer solution 3 mL     Review of Systems      All other systems reviewed and are otherwise negative except as noted above.  Physical Exam    VS:  BP 114/72   Pulse 80   Ht 6' 1"$  (1.854 m)   Wt 235 lb (106.6 kg)   SpO2 98%   BMI 31.00 kg/m  , BMI Body mass index is 31 kg/m.  Wt Readings from Last 3 Encounters:  02/08/23 235 lb (106.6 kg)  09/29/22 243 lb 9.6 oz (110.5 kg)  06/09/22 237 lb (107.5 kg)     GEN: Well nourished, well developed, in no acute distress. HEENT: normal. Neck: Supple, no JVD, carotid bruits, or masses. Cardiac: RRR, + systolic murmur, rubs, or gallops. No clubbing, cyanosis, edema.  Radials/PT 2+ and equal bilaterally.  Respiratory:  Respirations regular and unlabored, clear to auscultation bilaterally. GI: Soft, nontender, nondistended. MS: No deformity or atrophy. Skin: Warm and dry, no rash. Neuro:  Strength and sensation are intact. Psych: Normal affect.  Assessment & Plan    Moderate aortic stenosis/moderate aortic regurgitation -recent echo reviewed with the patient -stable from echo last year -continue current medication including aggressive lipid reduction -continue flaxseed oil and asa 62m -increase Lipitor to 443mdaily and repeat lipid panel in 6 weeks  Aortic dilatation, moderate (45 mm) -follow-up CTA ordered today -continue to monitor BP, well controlled today 114/72  Hyperlipidemia -increase lipitor and repeat labs in 6 weeks -lipid lowering diet info provided today -limit alcohol and sugary foods       Disposition: Follow up 1 year  with PeJenkins RougeMD or APP.  Signed, TeElgie CollardPA-C 02/08/2023, 4:19 PM [facility]

## 2023-02-08 NOTE — Patient Instructions (Addendum)
Medication Instructions:  1.Increase atorvastatin (Lipitor) to 40 mg daily *If you need a refill on your cardiac medications before your next appointment, please call your pharmacy*   Lab Work: Fasting lipids in 6 weeks If you have labs (blood work) drawn today and your tests are completely normal, you will receive your results only by: [place] (if you have MyChart) OR A paper copy in the mail If you have any lab test that is abnormal or we need to change your treatment, we will call you to review the results.   Testing/Procedures: Your provider has recommended that you have a CT Angio Chest Aorta.   Follow-Up: At [facility], you and your health needs are our priority.  As part of our continuing mission to provide you with exceptional heart care, we have created designated Provider Care Teams.  These Care Teams include your primary Cardiologist (physician) and Advanced Practice Providers (APPs -  Physician Assistants and Nurse Practitioners) who all work together to provide you with the care you need, when you need it.  Your next appointment:   1 year(s)  Provider:   Jenkins Rouge, MD   Other Instructions Mediterranean Diet A Mediterranean diet refers to food and lifestyle choices that are based on the traditions of countries located on the [place]. It focuses on eating more fruits, vegetables, whole grains, beans, nuts, seeds, and heart-healthy fats, and eating less dairy, meat, eggs, and processed foods with added sugar, salt, and fat. This way of eating has been shown to help prevent certain conditions and improve outcomes for people who have chronic diseases, like kidney disease and heart disease. What are tips for following this plan? Reading food labels Check the serving size of packaged foods. For foods such as rice and pasta, the serving size refers to the amount of cooked product, not dry. Check the total fat in packaged foods. Avoid foods that  have saturated fat or trans fats. Check the ingredient list for added sugars, such as corn syrup. Shopping  Buy a variety of foods that offer a balanced diet, including: Fresh fruits and vegetables (produce). Grains, beans, nuts, and seeds. Some of these may be available in unpackaged forms or large amounts (in bulk). Fresh seafood. Poultry and eggs. Low-fat dairy products. Buy whole ingredients instead of prepackaged foods. Buy fresh fruits and vegetables in-season from local farmers markets. Buy plain frozen fruits and vegetables. If you do not have access to quality fresh seafood, buy precooked frozen shrimp or canned fish, such as tuna, salmon, or sardines. Stock your pantry so you always have certain foods on hand, such as olive oil, canned tuna, canned tomatoes, rice, pasta, and beans. Cooking Cook foods with extra-virgin olive oil instead of using butter or other vegetable oils. Have meat as a side dish, and have vegetables or grains as your main dish. This means having meat in small portions or adding small amounts of meat to foods like pasta or stew. Use beans or vegetables instead of meat in common dishes like chili or lasagna. Experiment with different cooking methods. Try roasting, broiling, steaming, and sauting vegetables. Add frozen vegetables to soups, stews, pasta, or rice. Add nuts or seeds for added healthy fats and plant protein at each meal. You can add these to yogurt, salads, or vegetable dishes. Marinate fish or vegetables using olive oil, lemon juice, garlic, and fresh herbs. Meal planning Plan to eat one vegetarian meal one day each week. Try to work up to two vegetarian  meals, if possible. Eat seafood two or more times a week. Have healthy snacks readily available, such as: Vegetable sticks with hummus. Greek yogurt. Fruit and nut trail mix. Eat balanced meals throughout the week. This includes: Fruit: 2-3 servings a day. Vegetables: 4-5 servings a  day. Low-fat dairy: 2 servings a day. Fish, poultry, or lean meat: 1 serving a day. Beans and legumes: 2 or more servings a week. Nuts and seeds: 1-2 servings a day. Whole grains: 6-8 servings a day. Extra-virgin olive oil: 3-4 servings a day. Limit red meat and sweets to only a few servings a month. Lifestyle  Cook and eat meals together with your family, when possible. Drink enough fluid to keep your urine pale yellow. Be physically active every day. This includes: Aerobic exercise like running or swimming. Leisure activities like gardening, walking, or housework. Get 7-8 hours of sleep each night. If recommended by your health care provider, drink red wine in moderation. This means 1 glass a day for nonpregnant women and 2 glasses a day for men. A glass of wine equals 5 oz (150 mL). What foods should I eat? Fruits Apples. Apricots. Avocado. Berries. Bananas. Cherries. Dates. Figs. Grapes. Lemons. Melon. Oranges. Peaches. Plums. Pomegranate. Vegetables Artichokes. Beets. Broccoli. Cabbage. Carrots. Eggplant. Green beans. Chard. Kale. Spinach. Onions. Leeks. Peas. Squash. Tomatoes. Peppers. Radishes. Grains Whole-grain pasta. Brown rice. Bulgur wheat. Polenta. Couscous. Whole-wheat bread. [place]. Meats and other proteins Beans. Almonds. Sunflower seeds. Pine nuts. Peanuts. [place]. Salmon. Scallops. Shrimp. [place]. Tilapia. Clams. Oysters. Eggs. Poultry without skin. Dairy Low-fat milk. Cheese. Greek yogurt. Fats and oils Extra-virgin olive oil. Avocado oil. Grapeseed oil. Beverages Water. Red wine. Herbal tea. Sweets and desserts Greek yogurt with honey. Baked apples. Poached pears. Trail mix. Seasonings and condiments Basil. Cilantro. Coriander. Cumin. Mint. Parsley. Sage. Rosemary. Tarragon. Garlic. Oregano. Thyme. Pepper. Balsamic vinegar. Tahini. Hummus. Tomato sauce. Olives. Mushrooms. The items listed above may not be a complete list of foods and beverages you can eat.  Contact a dietitian for more information. What foods should I limit? This is a list of foods that should be eaten rarely or only on special occasions. Fruits Fruit canned in syrup. Vegetables Deep-fried potatoes (french fries). Grains Prepackaged pasta or rice dishes. Prepackaged cereal with added sugar. Prepackaged snacks with added sugar. Meats and other proteins Beef. Pork. Lamb. Poultry with skin. Hot dogs. Berniece Salines. Dairy Ice cream. Sour cream. Whole milk. Fats and oils Butter. Canola oil. Vegetable oil. Beef fat (tallow). Lard. Beverages Juice. Sugar-sweetened soft drinks. Beer. Liquor and spirits. Sweets and desserts Cookies. Cakes. Pies. Candy. Seasonings and condiments Mayonnaise. Pre-made sauces and marinades. The items listed above may not be a complete list of foods and beverages you should limit. Contact a dietitian for more information. Summary The Mediterranean diet includes both food and lifestyle choices. Eat a variety of fresh fruits and vegetables, beans, nuts, seeds, and whole grains. Limit the amount of red meat and sweets that you eat. If recommended by your health care provider, drink red wine in moderation. This means 1 glass a day for nonpregnant women and 2 glasses a day for men. A glass of wine equals 5 oz (150 mL). This information is not intended to replace advice given to you by your health care provider. Make sure you discuss any questions you have with your health care provider. Document Revised: 01/10/2020 Document Reviewed: 11/07/2019 Elsevier Patient Education  [place].  Preventing High Cholesterol Cholesterol is a white, waxy substance similar to  fat that the human body needs to help build cells. The liver makes all the cholesterol that a person's body needs. Having high cholesterol (hypercholesterolemia) increases your risk for heart disease and stroke. Extra or excess cholesterol comes from the food that you eat. High cholesterol can  often be prevented with diet and lifestyle changes. If you already have high cholesterol, you can control it with diet, lifestyle changes, and medicines. How can high cholesterol affect me? If you have high cholesterol, fatty deposits (plaques) may build up on the walls of your blood vessels. The blood vessels that carry blood away from your heart are called arteries. Plaques make the arteries narrower and stiffer. This in turn can: Restrict or block blood flow and cause blood clots to form. Increase your risk for heart attack and stroke. What can increase my risk for high cholesterol? This condition is more likely to develop in people who: Eat foods that are high in saturated fat or cholesterol. Saturated fat is mostly found in foods that come from animal sources. Are overweight. Are not getting enough exercise. Use products that contain nicotine or tobacco, such as cigarettes, e-cigarettes, and chewing tobacco. Have a family history of high cholesterol (familial hypercholesterolemia). What actions can I take to prevent this? Nutrition  Eat less saturated fat. Avoid trans fats (partially hydrogenated oils). These are often found in margarine and in some baked goods, fried foods, and snacks bought in packages. Avoid precooked or cured meat, such as bacon, sausages, or meat loaves. Avoid foods and drinks that have added sugars. Eat more fruits, vegetables, and whole grains. Choose healthy sources of protein, such as fish, poultry, lean cuts of red meat, beans, peas, lentils, and nuts. Choose healthy sources of fat, such as: Nuts. Vegetable oils, especially olive oil. Fish that have healthy fats, such as omega-3 fatty acids. These fish include mackerel or salmon. Lifestyle Lose weight if you are overweight. Maintaining a healthy body mass index (BMI) can help prevent or control high cholesterol. It can also lower your risk for diabetes and high blood pressure. Ask your health care provider to  help you with a diet and exercise plan to lose weight safely. Do not use any products that contain nicotine or tobacco. These products include cigarettes, chewing tobacco, and vaping devices, such as e-cigarettes. If you need help quitting, ask your health care provider. Alcohol use Do not drink alcohol if: Your health care provider tells you not to drink. You are pregnant, may be pregnant, or are planning to become pregnant. If you drink alcohol: Limit how much you have to: 0-1 drink a day for women. 0-2 drinks a day for men. Know how much alcohol is in your drink. In the [place] one drink equals one 12 oz bottle of beer (355 mL), one 5 oz glass of wine (148 mL), or one 1 oz glass of hard liquor (44 mL). Activity  Get enough exercise. Do exercises as told by your health care provider. Each week, do at least 150 minutes of exercise that takes a medium level of effort (moderate-intensity exercise). This kind of exercise: Makes your heart beat faster while allowing you to still be able to talk. Can be done in short sessions several times a day or longer sessions a few times a week. For example, on 5 days each week, you could walk fast or ride your bike 3 times a day for 10 minutes each time. Medicines Your health care provider may recommend medicines to help lower cholesterol.  This may be a medicine to lower the amount of cholesterol that your liver makes. You may need medicine if: Diet and lifestyle changes have not lowered your cholesterol enough. You have high cholesterol and other risk factors for heart disease or stroke. Take over-the-counter and prescription medicines only as told by your health care provider. General information Manage your risk factors for high cholesterol. Talk with your health care provider about all your risk factors and how to lower your risk. Manage other conditions that you have, such as diabetes or high blood pressure (hypertension). Have blood tests to check  your cholesterol levels at regular points in time as told by your health care provider. Keep all follow-up visits. This is important. Where to find more information American Heart Association: www.heart.org National Heart, Lung, and Blood Institute: https://wilson-eaton.com/ Summary High cholesterol increases your risk for heart disease and stroke. By keeping your cholesterol level low, you can reduce your risk for these conditions. High cholesterol can often be prevented with diet and lifestyle changes. Work with your health care provider to manage your risk factors, and have your blood tested regularly. This information is not intended to replace advice given to you by your health care provider. Make sure you discuss any questions you have with your health care provider. Document Revised: 07/08/2022 Document Reviewed: 02/08/2021 Elsevier Patient Education  [place].

## 2023-03-01 ENCOUNTER — Ambulatory Visit (HOSPITAL_COMMUNITY)
Admission: RE | Admit: 2023-03-01 | Discharge: 2023-03-01 | Disposition: A | Discharge status: Home / Self Care | Payer: Medicare Other | Source: Ambulatory Visit | Attending: Physician Assistant | Admitting: Physician Assistant

## 2023-03-01 DIAGNOSIS — I351 Nonrheumatic aortic (valve) insufficiency: Secondary | ICD-10-CM | POA: Insufficient documentation

## 2023-03-01 DIAGNOSIS — I350 Nonrheumatic aortic (valve) stenosis: Secondary | ICD-10-CM

## 2023-03-01 MED ORDER — IOHEXOL 350 MG/ML SOLN
50.0000 mL | Freq: Once | INTRAVENOUS | Status: AC | PRN
  Administered 2023-03-01: 50 mL via INTRAVENOUS

## 2023-03-22 ENCOUNTER — Ambulatory Visit: Payer: Medicare Other | Attending: Physician Assistant

## 2023-03-22 DIAGNOSIS — E785 Hyperlipidemia, unspecified: Secondary | ICD-10-CM

## 2023-03-22 DIAGNOSIS — I351 Nonrheumatic aortic (valve) insufficiency: Secondary | ICD-10-CM

## 2023-03-22 DIAGNOSIS — I350 Nonrheumatic aortic (valve) stenosis: Secondary | ICD-10-CM

## 2023-03-23 ENCOUNTER — Telehealth: Payer: Self-pay | Admitting: Cardiovascular Disease

## 2023-03-23 LAB — LIPID PANEL
Chol/HDL Ratio: 4.6 ratio (ref 0.0–5.0)
Cholesterol, Total: 132 mg/dL (ref 100–199)
HDL: 29 mg/dL — ABNORMAL LOW (ref >39)
LDL Chol Calc (NIH): 63 mg/dL (ref 0–99)
Triglycerides: 244 mg/dL — ABNORMAL HIGH (ref 0–149)
VLDL Cholesterol Cal: 40 mg/dL (ref 5–40)

## 2023-03-23 NOTE — Telephone Encounter (Signed)
Patient is returning call to give the amount of fish oil his is taking which is 1000 mg

## 2023-03-23 NOTE — Telephone Encounter (Signed)
Information was sent to [facility]

## 2023-03-24 ENCOUNTER — Other Ambulatory Visit: Payer: Self-pay

## 2023-03-24 DIAGNOSIS — E785 Hyperlipidemia, unspecified: Secondary | ICD-10-CM

## 2023-06-12 NOTE — Progress Notes (Unsigned)
MEDICARE ANNUAL WELLNESS VISIT AND FOLLOW UP Assessment:   Over 30 minutes of exam, counseling, chart review, and critical decision making was performed  Encounter for Medicare annual wellness exam Due annually  Aortic atherosclerosis (HCC) - CT 09/2020 Control blood pressure, cholesterol, glucose, increase exercise.   Ectatic thoracic aorta (HCC) - CT 09/2020 Control blood pressure, cholesterol, glucose, increase exercise.  Encourage smoking cessation Attention on annual low dose screening exams  Bicuspid aortic valve No concerning symptoms Continue to follow with Dr. Eden Emms yearly  COPD (HCC)  Continue Ventolin PRN If using >2x weekly contact office for step up approach to medication and further evaluation of COPD. Smoking cessation encouraged Discussed risks associated with tobacco use and advised to reduce or quit Continue annual screenings   Osteoarthritis, unspecified osteoarthritis type, unspecified site Follows Orthopedics for pain management Continue Percocet Rest when flared. Stay well hydrated. Narcan on hand.  Benign prostatic hyperplasia without lower urinary tract symptoms Controlled Continue Finasteride  Mixed hyperlipidemia Controlled Continue medications; Atorvastatin, Omega 3. Discussed lifestyle modifications. Recommended diet heavy in fruits and veggies, omega 3's. Decrease consumption of animal meats, cheeses, and dairy products. Remain active and exercise as tolerated. Continue to monitor.  Erectile dysfunction, unspecified erectile dysfunction type Declines medication.  Vitamin D deficiency Continue supplement.  Abnormal glucose Discussed lifestyle modifications.  History of adenomatous polyp of colon Continue intake of fiber Colonoscopy UTD, due 2026  Medication management -     CBC with Differential/Platelet -     COMPLETE METABOLIC PANEL WITH GFR -     Lipid panel -     TSH -     Magnesium  COPD with exacerbation (HCC) -      albuterol (VENTOLIN HFA) 108 (90 Base) MCG/ACT inhaler; Inhale 2 puffs into the lungs every 4 (four) hours as needed for wheezing or shortness of breath. Please give generic or the one that insurance covers       Future Appointments  Date Time Provider Department Center  10/31/2023  2:00 PM Adela Glimpse, NP [facility] None  06/13/2024  2:30 PM Raynelle Dick, NP [facility] None     Plan:   During the course of the visit the patient was educated and counseled about appropriate screening and preventive services including:   Pneumococcal vaccine  Influenza vaccine Prevnar 13 Td vaccine Screening electrocardiogram Colorectal cancer screening Diabetes screening Glaucoma screening Nutrition counseling    Subjective:  Anthony Paul is a 67 y.o. male who presents for Medicare Annual Wellness Visit and 3 month follow up for HTN, hyperlipidemia, glucose management and vitamin D Def.   He sees pain management for bilateral rib pain, some flank pain. Has new onset right foot pain without any recent fall, trauma, injury.  Has tried lyrica/gabpentin without help, no help with Cymbalta.  Currently on Oxycodone last filled #180 on 06/03/23. Had tried trigger points, failed the trial of the spinal stimulator. Continues pain management with minimal relief  He is having a nerve block in lumbar region at [facility] tomorrow  Had normal AB [place] 11/2019, and GI visit, colonoscopy in 2021 and CT 02/2019 at [facility]   Reports occasional constipation (onset after oxycodone, well managed by [facility]). Also RLQ pain/tenderness persistent for 2+ years, worse with lifting or bearing down, hx of R inguinal hernia repair by Dr. Andrey Campanile in 2015.  Numbness in right groin, has mentioned to orthopedics.  Will follow up with them.   He continues to smoke, started age 66, smoked a pack a  day from 69 yr to 27 years old 40+ pack year history, but states he has decreased to 1/2 ppd, COPD 0 per Dr. Sherene Sires in 2019,  is not on an maintenance inhaler.  He has tried chantix in the past and did not tolerate this well.   He has a bicuspid aortic valve, follows with cardiology Dr. Eden Emms, Echo 06/2020- EF normal mild AS and mild to moderate AR stable gradients- mild aortic root dilitation and stress test normal 09/2016. Last seen 02/08/23 by Jari Favre PA-C  He takes trazodone 150 mg night with modest benefit for insomnia.   BMI is Body mass index is 30.95 kg/m., he has not been working on diet and exercise. Sedentary lifestyle.  Wt Readings from Last 3 Encounters:  06/13/23 234 lb 9.6 oz (106.4 kg)  02/08/23 235 lb (106.6 kg)  09/29/22 243 lb 9.6 oz (110.5 kg)   His blood pressure has been controlled at home without medication, today their BP is BP: 114/68  BP Readings from Last 3 Encounters:  06/13/23 114/68  02/08/23 114/72  09/29/22 110/72  He does not workout. He denies chest pain, shortness of breath, dizziness.   He has aortic atherosclerosis and ectaic aora per CT 09/2020.   He is on cholesterol medication (atorvastatin 40 mg daily) and denies myalgias. His cholesterol is not at goal. The cholesterol last visit was:   Lab Results  Component Value Date   CHOL 132 03/22/2023   HDL 29 (L) 03/22/2023   LDLCALC 63 03/22/2023   TRIG 244 (H) 03/22/2023   CHOLHDL 4.6 03/22/2023   He has not been working on diet and exercise for glucose managment, and denies nausea, paresthesia of the feet, polydipsia, polyuria, and visual disturbances. Last A1C in the office was:  Lab Results  Component Value Date   HGBA1C 5.8 (H) 09/29/2022   Drinking more water. Last GFR Lab Results  Component Value Date   EGFR 64 09/29/2022     Patient is on Vitamin D supplement, taking 2000 IU daily  Lab Results  Component Value Date   VD25OH 76 09/29/2022     He is on finasteride 5 mg for BPH with weak stream has since resolved. He has ED but declined meds. Last PSA was: Lab Results  Component Value Date   PSA  0.47 09/29/2022    Medication Review:    Current Outpatient Medications (Cardiovascular):    atorvastatin (LIPITOR) 40 MG tablet, Take 1 tablet (40 mg total) by mouth daily.    Current Facility-Administered Medications (Respiratory):    ipratropium-albuterol (DUONEB) 0.5-2.5 (3) MG/3ML nebulizer solution 3 mL  Current Outpatient Medications (Analgesics):    acetaminophen (TYLENOL) 500 MG tablet, Take 1,500 mg by mouth every 6 (six) hours as needed for mild pain.   aspirin 81 MG tablet, Take 81 mg by mouth daily.   oxyCODONE-acetaminophen (PERCOCET) 10-325 MG tablet, Take 1 tablet by mouth as needed.   Current Outpatient Medications (Hematological):    Cyanocobalamin (VITAMIN B12 PO), Take by mouth daily.   Current Outpatient Medications (Other):    Cholecalciferol (VITAMIN D) 2000 UNITS CAPS, Take 1 capsule by mouth daily. Takes 500 mg   Flaxseed, Linseed, (FLAXSEED OIL) 1200 MG CAPS, Take 1 capsule by mouth daily.   MOVANTIK 25 MG TABS tablet, TAKE 1 TABLET DAILY FOR REGULAR BM'S   NARCAN 4 MG/0.1ML LIQD nasal spray kit,    Omega-3 Fatty Acids (FISH OIL PO), Take 1 tablet by mouth daily.   traZODone (DESYREL) 150 MG  tablet, TAKE 1/2 TO 1 TABLET 1 HOUR BEFORE BEDTIME FOR SLEEP   vitamin C (ASCORBIC ACID) 250 MG tablet, Take 250 mg by mouth daily.  Current Facility-Administered Medications (Other):    0.9 %  sodium chloride infusion  Allergies: Allergies  Allergen Reactions   Morphine Itching    Can take hydrocodone without issues    Current Problems (verified) has COPD GOLD 0 ; Bicuspid aortic valve; Mixed hyperlipidemia; Borderline systolic HTN; ED (erectile dysfunction); DJD (degenerative joint disease); Abnormal glucose; Vitamin D deficiency; Medication management; Chest pain, atypical most c/w ibs ; Cigarette smoker (70+ pack year current smoker); Chronic left shoulder pain; Congenital insufficiency of aortic valve; Aortic atherosclerosis (HCC) - CT 09/2020; History  of adenomatous polyp of colon; Ectatic thoracic aorta (HCC) - CT 09/2020; Insomnia; and BPH (benign prostatic hyperplasia) on their problem list.  Screening Tests Immunization History  Administered Date(s) Administered   Influenza Inj Mdck Quad With Preservative 10/24/2017, 10/31/2018   Influenza Split 10/05/2015   Influenza, High Dose Seasonal PF 11/25/2020, 09/29/2021, 09/29/2022   Influenza,Quad,Nasal, Live 10/05/2015   Influenza,inj,Quad PF,6+ Mos 11/15/2013, 10/16/2019   Influenza,inj,quad, With Preservative 09/26/2016   Influenza-Unspecified 12/19/2009   PFIZER(Purple Top)SARS-COV-2 Vaccination 02/10/2020, 03/02/2020   PPD Test 03/09/2015, 03/14/2016   Pneumococcal Conjugate-13 11/15/2013   Pneumococcal Polysaccharide-23 05/07/2020   Pneumococcal-Unspecified 12/20/1995   Td 12/19/2004   Tdap 03/09/2015    Health Maintenance  Topic Date Due   COVID-19 Vaccine (3 - Pfizer risk series) 06/29/2023 (Originally 03/30/2020)   Zoster Vaccines- Shingrix (1 of 2) 09/13/2023 (Originally 01/25/1975)   INFLUENZA VACCINE  07/20/2023   Lung Cancer Screening  02/29/2024   Medicare Annual Wellness (AWV)  06/12/2024   Colonoscopy  01/13/2025   DTaP/Tdap/Td (3 - Td or Tdap) 03/08/2025   Pneumonia Vaccine 54+ Years old  Completed   Hepatitis C Screening  Completed   HPV VACCINES  Aged Out     Names of Other Physician/Practitioners you currently use: 1. [facility] here for primary care 2. [facility], eye doctor, last visit 2023 3. Dr. Sherrie Mustache, dentist, last visit 2023, q76m  Patient Care Team: Lucky Cowboy, MD as PCP - General (Internal Medicine) Wendall Stade, MD as PCP - Cardiology (Cardiology)  Surgical: He  has a past surgical history that includes Colon surgery (Jan 2012); Tonsillectomy; Hemorroidectomy; Colonoscopy (01/13/2014); Upper gi endoscopy (2015); Inguinal hernia repair (Right, 2015); Resection distal clavical (Right); and Upper  gastrointestinal endoscopy. Family His family history includes Benign prostatic hyperplasia in his father; Cancer in his sister; Diabetes in his mother; Heart disease in his father; Kidney disease in his sister. Social history  He reports that he has been smoking cigarettes. He started smoking about 51 years ago. He has a 70.50 pack-year smoking history. He quit smokeless tobacco use about 16 years ago.  His smokeless tobacco use included chew. He reports current alcohol use of about 5.0 standard drinks of alcohol per week. He reports that he does not use drugs.  MEDICARE WELLNESS OBJECTIVES: Physical activity:  Sedentary due to pain Cardiac risk factors: Cardiac Risk Factors include: dyslipidemia;male gender;advanced age (>32men, >79 women);smoking/ tobacco exposure;sedentary lifestyle;obesity (BMI >30kg/m2) Depression/mood screen:      06/13/2023    2:19 PM  Depression screen PHQ 2/9  Decreased Interest 0  Down, Depressed, Hopeless 0  PHQ - 2 Score 0    ADLs:     06/13/2023    2:17 PM  In your present state of health, do you  have any difficulty performing the following activities:  Hearing? 0  Vision? 0  Difficulty concentrating or making decisions? 0  Walking or climbing stairs? 0  Dressing or bathing? 0  Doing errands, shopping? 0     Cognitive Testing  Alert? Yes  Normal Appearance?Yes  Oriented to person? Yes  Place? Yes   Time? Yes  Recall of three objects?  Yes  Can perform simple calculations? Yes  Displays appropriate judgment?Yes  Can read the correct time from a watch face?Yes  EOL planning: Does Patient Have a Medical Advance Directive?: Yes Type of Advance Directive: Healthcare Power of Attorney, Living will Does patient want to make changes to medical advance directive?: No - Patient declined Copy of Healthcare Power of Attorney in Chart?: No - copy requested   Review of Systems  Constitutional:  Negative for malaise/fatigue and weight loss.  HENT:   Negative for hearing loss and tinnitus.   Eyes:  Negative for blurred vision and double vision.  Respiratory:  Negative for cough, sputum production, shortness of breath and wheezing.   Cardiovascular:  Negative for chest pain, palpitations, orthopnea, claudication, leg swelling and PND.  Gastrointestinal:  Negative for abdominal pain, blood in stool, constipation (Improved with med, ongoing with oxycodone), diarrhea, heartburn, melena, nausea and vomiting.  Genitourinary: Negative.   Musculoskeletal:  Negative for falls, joint pain and myalgias.  Skin:  Negative for rash.  Neurological:  Negative for dizziness, tingling, sensory change, weakness and headaches.  Endo/Heme/Allergies:  Negative for polydipsia.  Psychiatric/Behavioral: Negative.  Negative for depression, memory loss, substance abuse and suicidal ideas. The patient is not nervous/anxious and does not have insomnia.   All other systems reviewed and are negative.     Objective:   Today's Vitals   06/13/23 1402  BP: 114/68  Pulse: 74  Temp: 97.7 F (36.5 C)  SpO2: 98%  Weight: 234 lb 9.6 oz (106.4 kg)   Body mass index is 30.95 kg/m.  General appearance: alert, no distress, WD/WN, male HEENT: normocephalic, sclerae anicteric, TMs pearly, nares patent, no discharge or erythema, pharynx normal Oral cavity: MMM, no lesions Neck: supple, no lymphadenopathy, no thyromegaly, no masses Heart: RRR, mild 2/6 systolic murmur R 2nd ICS  Lungs: CTA bilaterally, no wheezes, rhonchi, or rales Abdomen: +bs, soft, no masses, no hepatomegaly, no splenomegaly Musculoskeletal: nontender, no swelling, no obvious deformity Strength 5/5. Slight antalgic gait Extremities: no edema, no cyanosis, no clubbing Pulses: 2+ symmetric, upper and lower extremities, normal cap refill Neurological: alert, oriented x 3, CN2-12 intact, strength normal upper extremities and lower extremities, sensation normal throughout, DTRs 2+ throughout, no  cerebellar signs, gait normal Psychiatric: normal affect, behavior normal, pleasant    Medicare Attestation I have personally reviewed: The patient's medical and social history Their use of alcohol, tobacco or illicit drugs Their current medications and supplements The patient's functional ability including ADLs,fall risks, home safety risks, cognitive, and hearing and visual impairment Diet and physical activities Evidence for depression or mood disorders  The patient's weight, height, BMI, and visual acuity have been recorded in the chart.  I have made referrals, counseling, and provided education to the patient based on review of the above and I have provided the patient with a written personalized care plan for preventive services.     Raynelle Dick, NP   06/13/2023

## 2023-06-13 ENCOUNTER — Ambulatory Visit (INDEPENDENT_AMBULATORY_CARE_PROVIDER_SITE_OTHER): Payer: Medicare Other | Admitting: Nurse Practitioner

## 2023-06-13 ENCOUNTER — Encounter: Payer: Self-pay | Admitting: Nurse Practitioner

## 2023-06-13 VITALS — BP 114/68 | HR 74 | Temp 97.7°F | Ht 73.0 in | Wt 234.6 lb

## 2023-06-13 DIAGNOSIS — N529 Male erectile dysfunction, unspecified: Secondary | ICD-10-CM

## 2023-06-13 DIAGNOSIS — Z0000 Encounter for general adult medical examination without abnormal findings: Secondary | ICD-10-CM

## 2023-06-13 DIAGNOSIS — Q231 Congenital insufficiency of aortic valve: Secondary | ICD-10-CM

## 2023-06-13 DIAGNOSIS — Z86010 Personal history of colonic polyps: Secondary | ICD-10-CM

## 2023-06-13 DIAGNOSIS — J449 Chronic obstructive pulmonary disease, unspecified: Secondary | ICD-10-CM

## 2023-06-13 DIAGNOSIS — R6889 Other general symptoms and signs: Secondary | ICD-10-CM

## 2023-06-13 DIAGNOSIS — Z0001 Encounter for general adult medical examination with abnormal findings: Secondary | ICD-10-CM

## 2023-06-13 DIAGNOSIS — E782 Mixed hyperlipidemia: Secondary | ICD-10-CM | POA: Diagnosis not present

## 2023-06-13 DIAGNOSIS — I77810 Thoracic aortic ectasia: Secondary | ICD-10-CM | POA: Diagnosis not present

## 2023-06-13 DIAGNOSIS — N400 Enlarged prostate without lower urinary tract symptoms: Secondary | ICD-10-CM

## 2023-06-13 DIAGNOSIS — Z79899 Other long term (current) drug therapy: Secondary | ICD-10-CM

## 2023-06-13 DIAGNOSIS — I700 Atherosclerosis of aorta: Secondary | ICD-10-CM

## 2023-06-13 DIAGNOSIS — M1990 Unspecified osteoarthritis, unspecified site: Secondary | ICD-10-CM

## 2023-06-13 DIAGNOSIS — J441 Chronic obstructive pulmonary disease with (acute) exacerbation: Secondary | ICD-10-CM

## 2023-06-13 DIAGNOSIS — E559 Vitamin D deficiency, unspecified: Secondary | ICD-10-CM

## 2023-06-13 DIAGNOSIS — R7309 Other abnormal glucose: Secondary | ICD-10-CM

## 2023-06-13 MED ORDER — ALBUTEROL SULFATE HFA 108 (90 BASE) MCG/ACT IN AERS
2.0000 | INHALATION_SPRAY | RESPIRATORY_TRACT | 0 refills | Status: DC | PRN
Start: 2023-06-13 — End: 2023-10-09

## 2023-06-13 NOTE — Patient Instructions (Signed)

## 2023-06-14 LAB — COMPLETE METABOLIC PANEL WITH GFR
AG Ratio: 1.9 (calc) (ref 1.0–2.5)
ALT: 24 U/L (ref 9–46)
AST: 18 U/L (ref 10–35)
Albumin: 4.3 g/dL (ref 3.6–5.1)
Alkaline phosphatase (APISO): 58 U/L (ref 35–144)
BUN: 16 mg/dL (ref 7–25)
CO2: 24 mmol/L (ref 20–32)
Calcium: 9.7 mg/dL (ref 8.6–10.3)
Chloride: 107 mmol/L (ref 98–110)
Creat: 1.11 mg/dL (ref 0.70–1.35)
Globulin: 2.3 g/dL (calc) (ref 1.9–3.7)
Glucose, Bld: 120 mg/dL — ABNORMAL HIGH (ref 65–99)
Potassium: 4.6 mmol/L (ref 3.5–5.3)
Sodium: 139 mmol/L (ref 135–146)
Total Bilirubin: 0.4 mg/dL (ref 0.2–1.2)
Total Protein: 6.6 g/dL (ref 6.1–8.1)
eGFR: 72 mL/min/{1.73_m2} (ref >=60)

## 2023-06-14 LAB — LIPID PANEL
Cholesterol: 135 mg/dL (ref <200)
HDL: 36 mg/dL — ABNORMAL LOW (ref >=40)
LDL Cholesterol (Calc): 75 mg/dL (calc)
Non-HDL Cholesterol (Calc): 99 mg/dL (calc) (ref <130)
Total CHOL/HDL Ratio: 3.8 (calc) (ref <5.0)
Triglycerides: 166 mg/dL — ABNORMAL HIGH (ref <150)

## 2023-06-14 LAB — CBC WITH DIFFERENTIAL/PLATELET
Absolute Monocytes: 460 cells/uL (ref 200–950)
Basophils Absolute: 23 cells/uL (ref 0–200)
Basophils Relative: 0.3 %
Eosinophils Absolute: 31 cells/uL (ref 15–500)
Eosinophils Relative: 0.4 %
HCT: 43.5 % (ref 38.5–50.0)
Hemoglobin: 14.9 g/dL (ref 13.2–17.1)
Lymphs Abs: 1084 cells/uL (ref 850–3900)
MCH: 32.2 pg (ref 27.0–33.0)
MCHC: 34.3 g/dL (ref 32.0–36.0)
MCV: 94 fL (ref 80.0–100.0)
MPV: 12 fL (ref 7.5–12.5)
Monocytes Relative: 5.9 %
Neutro Abs: 6201 cells/uL (ref 1500–7800)
Neutrophils Relative %: 79.5 %
Platelets: 176 10*3/uL (ref 140–400)
RBC: 4.63 10*6/uL (ref 4.20–5.80)
RDW: 11.9 % (ref 11.0–15.0)
Total Lymphocyte: 13.9 %
WBC: 7.8 10*3/uL (ref 3.8–10.8)

## 2023-06-14 LAB — MAGNESIUM: Magnesium: 1.9 mg/dL (ref 1.5–2.5)

## 2023-06-14 LAB — TSH: TSH: 1.64 mIU/L (ref 0.40–4.50)

## 2023-07-29 ENCOUNTER — Other Ambulatory Visit: Payer: Self-pay | Admitting: Nurse Practitioner

## 2023-10-07 ENCOUNTER — Other Ambulatory Visit: Payer: Self-pay | Admitting: Nurse Practitioner

## 2023-10-07 DIAGNOSIS — J441 Chronic obstructive pulmonary disease with (acute) exacerbation: Secondary | ICD-10-CM

## 2023-10-31 ENCOUNTER — Encounter: Payer: Self-pay | Admitting: Nurse Practitioner

## 2023-10-31 ENCOUNTER — Ambulatory Visit (INDEPENDENT_AMBULATORY_CARE_PROVIDER_SITE_OTHER): Payer: Medicare Other | Admitting: Nurse Practitioner

## 2023-10-31 VITALS — BP 104/64 | HR 84 | Temp 97.5°F | Ht 72.5 in | Wt 237.6 lb

## 2023-10-31 DIAGNOSIS — Z136 Encounter for screening for cardiovascular disorders: Secondary | ICD-10-CM

## 2023-10-31 DIAGNOSIS — E782 Mixed hyperlipidemia: Secondary | ICD-10-CM

## 2023-10-31 DIAGNOSIS — E538 Deficiency of other specified B group vitamins: Secondary | ICD-10-CM

## 2023-10-31 DIAGNOSIS — N400 Enlarged prostate without lower urinary tract symptoms: Secondary | ICD-10-CM

## 2023-10-31 DIAGNOSIS — I10 Essential (primary) hypertension: Secondary | ICD-10-CM | POA: Diagnosis not present

## 2023-10-31 DIAGNOSIS — Z23 Encounter for immunization: Secondary | ICD-10-CM | POA: Diagnosis not present

## 2023-10-31 DIAGNOSIS — R7309 Other abnormal glucose: Secondary | ICD-10-CM

## 2023-10-31 DIAGNOSIS — Z79899 Other long term (current) drug therapy: Secondary | ICD-10-CM

## 2023-10-31 DIAGNOSIS — Z87891 Personal history of nicotine dependence: Secondary | ICD-10-CM | POA: Diagnosis not present

## 2023-10-31 DIAGNOSIS — Q2381 Bicuspid aortic valve: Secondary | ICD-10-CM

## 2023-10-31 DIAGNOSIS — Z860101 Personal history of adenomatous and serrated colon polyps: Secondary | ICD-10-CM

## 2023-10-31 DIAGNOSIS — I700 Atherosclerosis of aorta: Secondary | ICD-10-CM

## 2023-10-31 DIAGNOSIS — Z0001 Encounter for general adult medical examination with abnormal findings: Secondary | ICD-10-CM

## 2023-10-31 DIAGNOSIS — I77810 Thoracic aortic ectasia: Secondary | ICD-10-CM

## 2023-10-31 DIAGNOSIS — E559 Vitamin D deficiency, unspecified: Secondary | ICD-10-CM

## 2023-10-31 DIAGNOSIS — J449 Chronic obstructive pulmonary disease, unspecified: Secondary | ICD-10-CM

## 2023-10-31 DIAGNOSIS — M1990 Unspecified osteoarthritis, unspecified site: Secondary | ICD-10-CM

## 2023-10-31 DIAGNOSIS — N529 Male erectile dysfunction, unspecified: Secondary | ICD-10-CM

## 2023-10-31 DIAGNOSIS — Z1389 Encounter for screening for other disorder: Secondary | ICD-10-CM

## 2023-10-31 NOTE — Progress Notes (Signed)
ANNUAL CPE Assessment:   Anthony Paul presents to office today for his annual physical.  Below references diagnostics and current plan of care:   CPE Due annually  Health maintenance reviewed Healthily lifestyle goals set   Ectatic thoracic aorta (HCC) - CT 09/2020 Control blood pressure, cholesterol, glucose, increase exercise.  Encourage smoking cessation Attention on annual low dose screening exams  Bicuspid aortic valve No concerning symptoms Continue to follow with Dr. Eden Emms yearly  COPD (HCC)  Continue Ventolin PRN If using >2x weekly contact office for step up approach to medication and further evaluation of COPD. Smoking cessation encouraged Discussed risks associated with tobacco use and advised to reduce or quit Continue annual screenings   Osteoarthritis, unspecified osteoarthritis type, unspecified site Follows Orthopedics for pain management Continue Percocet Rest when flared. Stay well hydrated. Narcan on hand.  Benign prostatic hyperplasia without lower urinary tract symptoms Controlled Continue Finasteride  Mixed hyperlipidemia/Aortic atherosclerosis (HCC) - CT 09/2020 Control blood pressure, cholesterol, glucose, increase exercise.  Continue medications; Atorvastatin, Omega 3. Discussed lifestyle modifications. Recommended diet heavy in fruits and veggies, omega 3's. Decrease consumption of animal meats, cheeses, and dairy products. Remain active and exercise as tolerated. Continue to monitor.  Erectile dysfunction, unspecified erectile dysfunction type Declines medication.  Vitamin D deficiency Continue supplement.  Abnormal glucose Discussed lifestyle modifications.  History of adenomatous polyp of colon Continue intake of fiber Colonoscopy UTD, due 2026  Medication management All medications discussed and reviewed in full. All questions and concerns regarding medications addressed.    Screening for hematuria or proteinuria Check and  monitor Microalbumin / creatinine urine ratio Check and monitor Urinalysis, Routine w reflex microscopic  B12 deficiency Monitor levels   Flu vaccine need Administered - patient tolerated well  Orders Placed This Encounter  Procedures   CBC with Differential/Platelet   COMPLETE METABOLIC PANEL WITH GFR   Magnesium   Lipid panel   TSH   Hemoglobin A1c   Insulin, random   VITAMIN D 25 Hydroxy (Vit-D Deficiency, Fractures)   Urinalysis, Routine w reflex microscopic   Microalbumin / creatinine urine ratio   PSA   Vitamin B12   EKG 12-Lead    Notify office for further evaluation and treatment, questions or concerns if any reported s/s fail to improve.   The patient was advised to call back or seek an in-person evaluation if any symptoms worsen or if the condition fails to improve as anticipated.   Further disposition pending results of labs. Discussed med's effects and SE's.    I discussed the assessment and treatment plan with the patient. The patient was provided an opportunity to ask questions and all were answered. The patient agreed with the plan and demonstrated an understanding of the instructions.  Discussed med's effects and SE's. Screening labs and tests as requested with regular follow-up as recommended.  I provided 30 minutes of face-to-face time during this encounter including counseling, chart review, and critical decision making was preformed.  Today's Plan of Care is based on a patient-centered health care approach known as shared decision making - the decisions, tests and treatments allow for patient preferences and values to be balanced with clinical evidence.    Future Appointments  Date Time Provider Department Center  06/13/2024  2:30 PM Raynelle Dick, NP [facility] None  10/30/2024  2:00 PM Adela Glimpse, NP [facility] None    Subjective:  Anthony Paul is a 67 y.o. male who presents for annual CPE and follow up for HTN, hyperlipidemia,  glucose  management and vitamin D Def.   Overall he reports feeling well today.  He has no new concerns at this time.  He sees pain management for bilateral rib pain, some flank pain. Has new onset right foot pain without any recent fall, trauma, injury.  Has tried lyrica/gabpentin without help, no help with Cymbalta.  Currently on Oxycodone last filled #180 on 06/03/23. Had tried trigger points, failed the trial of the spinal stimulator. Continues pain management with minimal relief.  He completed a  nerve block in lumbar region at [facility] which did not take. Numbness in right groin, has mentioned to orthopedics.  Continues to follow PRN.  Reports occasional constipation (onset after oxycodone, well managed by [facility]). Also RLQ pain/tenderness persistent for 2+ years, worse with lifting or bearing down, hx of R inguinal hernia repair by Dr. Andrey Campanile in 2015.  Had normal AB [place] 11/2019, and GI visit, colonoscopy in 2021 and CT 02/2019 at [facility].   He continues to smoke, started age 81, smoked a pack a day from 22 yr to 48 years old 40+ pack year history, but states he has decreased to 1/2 ppd, COPD 0 per Dr. Sherene Sires in 2019, is not on an maintenance inhaler and continues Ventolin PRN.  He has tried chantix in the past and did not tolerate this well.   He has a bicuspid aortic valve, follows with cardiology Dr. Eden Emms, Echo 06/2020- EF normal mild AS and mild to moderate AR stable gradients- mild aortic root dilitation and stress test normal 09/2016. Last seen 02/08/23 by Jari Favre PA-C.  He takes trazodone 150 mg night with modest benefit for insomnia.   BMI is Body mass index is 31.78 kg/m., he has not been working on diet and exercise. Sedentary lifestyle.  Wt Readings from Last 3 Encounters:  10/31/23 237 lb 9.6 oz (107.8 kg)  06/13/23 234 lb 9.6 oz (106.4 kg)  02/08/23 235 lb (106.6 kg)   His blood pressure has been controlled at home without medication, today their BP is BP: 104/64  BP Readings  from Last 3 Encounters:  10/31/23 104/64  06/13/23 114/68  02/08/23 114/72  He does not workout. He denies chest pain, shortness of breath, dizziness.   He has aortic atherosclerosis and ectaic aora per CT 09/2020.   He is on cholesterol medication (atorvastatin 40 mg daily) and denies myalgias. His cholesterol is not at goal. The cholesterol last visit was:   Lab Results  Component Value Date   CHOL 135 06/13/2023   HDL 36 (L) 06/13/2023   LDLCALC 75 06/13/2023   TRIG 166 (H) 06/13/2023   CHOLHDL 3.8 06/13/2023   He has not been working on diet and exercise for glucose managment, and denies nausea, paresthesia of the feet, polydipsia, polyuria, and visual disturbances. Last A1C in the office was:  Lab Results  Component Value Date   HGBA1C 5.8 (H) 09/29/2022   Drinking more water. Last GFR Lab Results  Component Value Date   EGFR 72 06/13/2023     Patient is on Vitamin D supplement, taking 2000 IU daily  Lab Results  Component Value Date   VD25OH 44 09/29/2022     He is on finasteride 5 mg for BPH with weak stream has since resolved. He has ED but declined meds. Last PSA was: Lab Results  Component Value Date   PSA 0.47 09/29/2022    Medication Review:    Current Outpatient Medications (Cardiovascular):    atorvastatin (LIPITOR) 40 MG  tablet, Take 1 tablet (40 mg total) by mouth daily.   Current Outpatient Medications (Respiratory):    albuterol (VENTOLIN HFA) 108 (90 Base) MCG/ACT inhaler, INHALE 2 PUFFS INTO THE LUNGS EVERY 4 (FOUR) HOURS AS NEEDED FOR WHEEZING OR SHORTNESS OF BREATH. PLEASE GIVE GENERIC OR THE ONE THAT INSURANCE COVERS  Current Facility-Administered Medications (Respiratory):    ipratropium-albuterol (DUONEB) 0.5-2.5 (3) MG/3ML nebulizer solution 3 mL  Current Outpatient Medications (Analgesics):    aspirin 81 MG tablet, Take 81 mg by mouth daily.   celecoxib (CELEBREX) 200 MG capsule, Take 200 mg by mouth daily.   oxyCODONE-acetaminophen  (PERCOCET) 10-325 MG tablet, Take 1 tablet by mouth as needed.   acetaminophen (TYLENOL) 500 MG tablet, Take 1,500 mg by mouth every 6 (six) hours as needed for mild pain. (Patient not taking: Reported on 10/31/2023)   Current Outpatient Medications (Hematological):    Cyanocobalamin (VITAMIN B12 PO), Take by mouth daily.   Current Outpatient Medications (Other):    Cholecalciferol (VITAMIN D) 2000 UNITS CAPS, Take 1 capsule by mouth daily. Takes 500 mg   Flaxseed, Linseed, (FLAXSEED OIL) 1200 MG CAPS, Take 1 capsule by mouth daily.   MOVANTIK 25 MG TABS tablet, TAKE 1 TABLET DAILY FOR REGULAR BM'S   NARCAN 4 MG/0.1ML LIQD nasal spray kit,    Omega-3 Fatty Acids (FISH OIL PO), Take 1 tablet by mouth daily.   traZODone (DESYREL) 150 MG tablet, TAKE 1/2 TO 1 TABLET 1 HOUR BEFORE BEDTIME FOR SLEEP   vitamin C (ASCORBIC ACID) 250 MG tablet, Take 250 mg by mouth daily. (Patient not taking: Reported on 10/31/2023)  Current Facility-Administered Medications (Other):    0.9 %  sodium chloride infusion  Allergies: Allergies  Allergen Reactions   Morphine Itching    Can take hydrocodone without issues    Current Problems (verified) has COPD GOLD 0 ; Bicuspid aortic valve; Mixed hyperlipidemia; Borderline systolic HTN; ED (erectile dysfunction); DJD (degenerative joint disease); Abnormal glucose; Vitamin D deficiency; Medication management; Chest pain, atypical most c/w ibs ; Cigarette smoker (70+ pack year current smoker); Chronic left shoulder pain; Congenital insufficiency of aortic valve; Aortic atherosclerosis (HCC) - CT 09/2020; History of adenomatous polyp of colon; Ectatic thoracic aorta (HCC) - CT 09/2020; Insomnia; and BPH (benign prostatic hyperplasia) on their problem list.  Screening Tests Immunization History  Administered Date(s) Administered   Influenza Inj Mdck Quad With Preservative 10/24/2017, 10/31/2018   Influenza Split 10/05/2015   Influenza, High Dose Seasonal PF  11/25/2020, 09/29/2021, 09/29/2022   Influenza,Quad,Nasal, Live 10/05/2015   Influenza,inj,Quad PF,6+ Mos 11/15/2013, 10/16/2019   Influenza,inj,quad, With Preservative 09/26/2016   Influenza-Unspecified 12/19/2009   PFIZER(Purple Top)SARS-COV-2 Vaccination 02/10/2020, 03/02/2020   PPD Test 03/09/2015, 03/14/2016   Pneumococcal Conjugate-13 11/15/2013   Pneumococcal Polysaccharide-23 05/07/2020   Pneumococcal-Unspecified 12/20/1995   Td 12/19/2004   Tdap 03/09/2015    Health Maintenance  Topic Date Due   Zoster Vaccines- Shingrix (1 of 2) Never done   COVID-19 Vaccine (3 - Pfizer risk series) 03/30/2020   INFLUENZA VACCINE  07/20/2023   Lung Cancer Screening  02/29/2024   Medicare Annual Wellness (AWV)  06/12/2024   Colonoscopy  01/13/2025   DTaP/Tdap/Td (3 - Td or Tdap) 03/08/2025   Pneumonia Vaccine 26+ Years old  Completed   Hepatitis C Screening  Completed   HPV VACCINES  Aged Out     Names of Other Physician/Practitioners you currently use: 1. [facility] here for primary care 2. [facility], eye doctor,  last visit 2023 3. Dr. Sherrie Mustache, dentist, last visit 2024, q60m  Patient Care Team: Lucky Cowboy, MD as PCP - General (Internal Medicine) Wendall Stade, MD as PCP - Cardiology (Cardiology)  Surgical: He  has a past surgical history that includes Colon surgery (Jan 2012); Tonsillectomy; Hemorroidectomy; Colonoscopy (01/13/2014); Upper gi endoscopy (2015); Inguinal hernia repair (Right, 2015); Resection distal clavical (Right); and Upper gastrointestinal endoscopy. Family His family history includes Benign prostatic hyperplasia in his father; Cancer in his sister; Diabetes in his mother; Heart disease in his father; Kidney disease in his sister. Social history  He reports that he has been smoking cigarettes. He started smoking about 51 years ago. He has a 77.8 pack-year smoking history. He quit smokeless tobacco use about 16 years  ago.  His smokeless tobacco use included chew. He reports current alcohol use of about 5.0 standard drinks of alcohol per week. He reports that he does not use drugs.  Review of Systems  Constitutional:  Negative for malaise/fatigue and weight loss.  HENT:  Negative for hearing loss and tinnitus.   Eyes:  Negative for blurred vision and double vision.  Respiratory:  Negative for cough, sputum production, shortness of breath and wheezing.   Cardiovascular:  Negative for chest pain, palpitations, orthopnea, claudication, leg swelling and PND.  Gastrointestinal:  Negative for abdominal pain, blood in stool, constipation (Improved with med, ongoing with oxycodone), diarrhea, heartburn, melena, nausea and vomiting.  Genitourinary: Negative.   Musculoskeletal:  Negative for falls, joint pain and myalgias.  Skin:  Negative for rash.  Neurological:  Negative for dizziness, tingling, sensory change, weakness and headaches.  Endo/Heme/Allergies:  Negative for polydipsia.  Psychiatric/Behavioral: Negative.  Negative for depression, memory loss, substance abuse and suicidal ideas. The patient is not nervous/anxious and does not have insomnia.   All other systems reviewed and are negative.   Objective:   Today's Vitals   10/31/23 1348  BP: 104/64  Pulse: 84  Temp: (!) 97.5 F (36.4 C)  SpO2: 96%  Weight: 237 lb 9.6 oz (107.8 kg)  Height: 6' 0.5" (1.842 m)   Body mass index is 31.78 kg/m.  General appearance: alert, no distress, WD/WN, male HEENT: normocephalic, sclerae anicteric, TMs pearly, nares patent, no discharge or erythema, pharynx normal Oral cavity: MMM, no lesions Neck: supple, no lymphadenopathy, no thyromegaly, no masses Heart: RRR, mild 2/6 systolic murmur R 2nd ICS  Lungs: CTA bilaterally, no wheezes, rhonchi, or rales Abdomen: +bs, soft, no masses, no hepatomegaly, no splenomegaly Musculoskeletal: nontender, no swelling, no obvious deformity Strength 5/5. Slight antalgic  gait Extremities: no edema, no cyanosis, no clubbing Pulses: 2+ symmetric, upper and lower extremities, normal cap refill Neurological: alert, oriented x 3, CN2-12 intact, strength normal upper extremities and lower extremities, sensation normal throughout, DTRs 2+ throughout, no cerebellar signs, gait normal Psychiatric: normal affect, behavior normal, pleasant   EKG:  NSR  Shatona Andujar, NP   10/31/2023

## 2023-10-31 NOTE — Patient Instructions (Signed)

## 2023-11-01 LAB — COMPLETE METABOLIC PANEL WITH GFR
AG Ratio: 1.8 (calc) (ref 1.0–2.5)
ALT: 20 U/L (ref 9–46)
AST: 16 U/L (ref 10–35)
Albumin: 4.4 g/dL (ref 3.6–5.1)
Alkaline phosphatase (APISO): 66 U/L (ref 35–144)
BUN: 23 mg/dL (ref 7–25)
CO2: 28 mmol/L (ref 20–32)
Calcium: 10 mg/dL (ref 8.6–10.3)
Chloride: 103 mmol/L (ref 98–110)
Creat: 1.26 mg/dL (ref 0.70–1.35)
Globulin: 2.4 g/dL (ref 1.9–3.7)
Glucose, Bld: 76 mg/dL (ref 65–99)
Potassium: 4.7 mmol/L (ref 3.5–5.3)
Sodium: 138 mmol/L (ref 135–146)
Total Bilirubin: 0.5 mg/dL (ref 0.2–1.2)
Total Protein: 6.8 g/dL (ref 6.1–8.1)
eGFR: 62 mL/min/{1.73_m2} (ref >=60)

## 2023-11-01 LAB — LIPID PANEL
Cholesterol: 147 mg/dL (ref <200)
HDL: 28 mg/dL — ABNORMAL LOW (ref >=40)
LDL Cholesterol (Calc): 82 mg/dL
Non-HDL Cholesterol (Calc): 119 mg/dL (ref <130)
Total CHOL/HDL Ratio: 5.3 (calc) — ABNORMAL HIGH (ref <5.0)
Triglycerides: 278 mg/dL — ABNORMAL HIGH (ref <150)

## 2023-11-01 LAB — CBC WITH DIFFERENTIAL/PLATELET
Absolute Lymphocytes: 1365 {cells}/uL (ref 850–3900)
Absolute Monocytes: 653 {cells}/uL (ref 200–950)
Basophils Absolute: 23 {cells}/uL (ref 0–200)
Basophils Relative: 0.3 %
Eosinophils Absolute: 60 {cells}/uL (ref 15–500)
Eosinophils Relative: 0.8 %
HCT: 44.2 % (ref 38.5–50.0)
Hemoglobin: 15 g/dL (ref 13.2–17.1)
MCH: 31.8 pg (ref 27.0–33.0)
MCHC: 33.9 g/dL (ref 32.0–36.0)
MCV: 93.8 fL (ref 80.0–100.0)
MPV: 11.7 fL (ref 7.5–12.5)
Monocytes Relative: 8.7 %
Neutro Abs: 5400 {cells}/uL (ref 1500–7800)
Neutrophils Relative %: 72 %
Platelets: 199 10*3/uL (ref 140–400)
RBC: 4.71 10*6/uL (ref 4.20–5.80)
RDW: 12.2 % (ref 11.0–15.0)
Total Lymphocyte: 18.2 %
WBC: 7.5 10*3/uL (ref 3.8–10.8)

## 2023-11-01 LAB — HEMOGLOBIN A1C
Hgb A1c MFr Bld: 6.2 %{Hb} — ABNORMAL HIGH (ref <5.7)
Mean Plasma Glucose: 131 mg/dL
eAG (mmol/L): 7.3 mmol/L

## 2023-11-01 LAB — TSH: TSH: 1.6 m[IU]/L (ref 0.40–4.50)

## 2023-11-01 LAB — MAGNESIUM: Magnesium: 2.1 mg/dL (ref 1.5–2.5)

## 2023-11-01 LAB — INSULIN, RANDOM: Insulin: 31 u[IU]/mL — ABNORMAL HIGH

## 2023-11-01 LAB — PSA: PSA: 0.58 ng/mL (ref <=4.00)

## 2023-11-01 LAB — VITAMIN D 25 HYDROXY (VIT D DEFICIENCY, FRACTURES): Vit D, 25-Hydroxy: 63 ng/mL (ref 30–100)

## 2023-11-01 LAB — VITAMIN B12: Vitamin B-12: 249 pg/mL (ref 200–1100)

## 2023-11-02 LAB — URINALYSIS, ROUTINE W REFLEX MICROSCOPIC
Bilirubin Urine: NEGATIVE
Glucose, UA: NEGATIVE
Hgb urine dipstick: NEGATIVE
Ketones, ur: NEGATIVE
Leukocytes,Ua: NEGATIVE
Nitrite: NEGATIVE
Protein, ur: NEGATIVE
Specific Gravity, Urine: 1.011 (ref 1.001–1.035)
pH: 5.5 (ref 5.0–8.0)

## 2023-11-02 LAB — MICROALBUMIN / CREATININE URINE RATIO
Creatinine, Urine: 91 mg/dL (ref 20–320)
Microalb, Ur: <0.2 mg/dL

## 2023-11-14 ENCOUNTER — Ambulatory Visit: Payer: Medicare Other | Admitting: Nurse Practitioner

## 2024-01-26 ENCOUNTER — Other Ambulatory Visit: Payer: Self-pay | Admitting: Physician Assistant

## 2024-01-31 ENCOUNTER — Ambulatory Visit: Payer: Medicare Other | Admitting: Nurse Practitioner

## 2024-02-06 NOTE — Progress Notes (Signed)
 Patient ID: Anthony Paul, male   DOB: 08/21/1956, 68 y.o.   MRN: [id number]     68 y.o. referred by Dr Oneta Rack for murmur in 2014 Diagnosed with bicuspid AV Sees dentist twice/yearr. Previous smoker. CRF elevated BP and cholesterol MRI 2013 with no coarctation and ascending aorta 3.9 cm   Echo 07/14/20 EF 60-65% mild AR moderate AS mean gradient 20 peak 32 mmHg DVI 0.42 AVA 1.5 cm2 Echo 01/31/23 EF 65-70% moderate AR/AS aortic root 45 mm mean gradient 27 peak 47.3 mmHg DVI 0.43   Retired Hospital doctor for [employer]  3 Daughters two in town and one in [place] Active with no dyspnea palpitations or syncope  Cardiac CT 12/11/18 functionally bicuspid AV with partial fusion left and right cusps. 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  All other systems reviewed and negative  General: There were no vitals taken for this visit. Affect appropriate Healthy:  appears stated age HEENT: normal Neck supple with no adenopathy JVP normal no bruits no thyromegaly Lungs clear with no wheezing and good diaphragmatic motion Heart:  S1/S2  AS/AR  murmur radiates to both carotids , no rub, gallop or click PMI normal Abdomen: benighn, BS positve, no tenderness, no AAA no bruit.  No HSM or HJR Distal pulses intact with no bruits No edema Neuro non-focal Skin warm and dry No muscular weakness     Current Outpatient Medications  Medication Sig Dispense Refill   acetaminophen (TYLENOL) 500 MG tablet Take 1,500 mg by mouth every 6 (six) hours as needed for mild pain. (Patient  not taking: Reported on 10/31/2023)     albuterol (VENTOLIN HFA) 108 (90 Base) MCG/ACT inhaler INHALE 2 PUFFS INTO THE LUNGS EVERY 4 (FOUR) HOURS AS NEEDED FOR WHEEZING OR SHORTNESS OF BREATH. PLEASE GIVE GENERIC OR THE ONE THAT INSURANCE COVERS 18 each 2   aspirin 81 MG tablet Take 81 mg by mouth daily.     atorvastatin (LIPITOR) 40 MG tablet TAKE 1 TABLET BY MOUTH EVERY DAY 90 tablet 3   celecoxib (CELEBREX) 200 MG capsule Take 200 mg by mouth daily.     Cholecalciferol (VITAMIN D) 2000 UNITS CAPS Take 1 capsule by mouth daily. Takes 500 mg     Cyanocobalamin (VITAMIN B12 PO) Take by mouth daily.     Flaxseed, Linseed, (FLAXSEED OIL) 1200 MG CAPS Take 1 capsule by mouth daily.     MOVANTIK 25 MG TABS tablet TAKE 1 TABLET DAILY FOR REGULAR BM'S 90 tablet 3   NARCAN 4 MG/0.1ML LIQD nasal spray kit      Omega-3 Fatty Acids (FISH OIL PO) Take 1 tablet by mouth daily.     oxyCODONE-acetaminophen (PERCOCET) 10-325 MG tablet Take 1 tablet by mouth as needed.     traZODone (DESYREL) 150 MG tablet TAKE 1/2 TO 1 TABLET 1 HOUR BEFORE BEDTIME FOR SLEEP 90 tablet 3   Current Facility-Administered Medications  Medication Dose Route Frequency Provider Last Rate Last Admin   0.9 %  sodium chloride infusion  500 mL Intravenous Once Armbruster, Willaim Rayas, MD       ipratropium-albuterol (DUONEB)  0.5-2.5 (3) MG/3ML nebulizer solution 3 mL  3 mL Nebulization Once Judd Gaudier, NP        Allergies  Morphine  Electrocardiogram: 06/23/20 SR rate 88 normal   Assessment and Plan  Bicuspid Aortic Valve:  with moderate AS mild AR  update TTE  Aortic Dilatation: Ascending aorta 4.2 cm stable on CTA 03/01/23    GI:  Seeing pain clinic CT with no pathology low risk for SMA disease  F/u GI  Elevated Triglycerides:  despite flaxseed oil discussed starting Vascepa and low fat diet. On lipitor with LDL 82 and triglycerides 278.    CAD:  None noted on cardiac CT 12/11/18 On statin and ASA   COPD:  no active  wheezing 70 pack year history lung cancer CT 10/16/21 no cancer Counseled on smoking cessation < 10 minutes lung fields ok on CTA or aorta 03/01/23   Bruits:  likely radiating murmur from AS but needs carotid duplex to r/o stenosis   Echo for AS/AR  Start Vascepa Lipid/Liver in 3 months Carotid duplex   F/U with me in a year   [employer]

## 2024-02-14 ENCOUNTER — Ambulatory Visit: Payer: Medicare Other | Attending: Cardiovascular Disease | Admitting: Cardiovascular Disease

## 2024-02-14 ENCOUNTER — Other Ambulatory Visit: Payer: Self-pay

## 2024-02-14 ENCOUNTER — Encounter: Payer: Self-pay | Admitting: Cardiovascular Disease

## 2024-02-14 VITALS — BP 112/70 | HR 86 | Ht 72.0 in | Wt 238.2 lb

## 2024-02-14 DIAGNOSIS — I350 Nonrheumatic aortic (valve) stenosis: Secondary | ICD-10-CM

## 2024-02-14 DIAGNOSIS — Q2381 Bicuspid aortic valve: Secondary | ICD-10-CM

## 2024-02-14 DIAGNOSIS — E785 Hyperlipidemia, unspecified: Secondary | ICD-10-CM

## 2024-02-14 DIAGNOSIS — I351 Nonrheumatic aortic (valve) insufficiency: Secondary | ICD-10-CM | POA: Diagnosis present

## 2024-02-14 DIAGNOSIS — R0989 Other specified symptoms and signs involving the circulatory and respiratory systems: Secondary | ICD-10-CM

## 2024-02-14 DIAGNOSIS — E782 Mixed hyperlipidemia: Secondary | ICD-10-CM

## 2024-02-14 MED ORDER — ICOSAPENT ETHYL 1 G PO CAPS: 2.0000 g | ORAL_CAPSULE | Freq: Two times a day (BID) | ORAL | 11 refills | Status: AC

## 2024-02-14 NOTE — Patient Instructions (Addendum)
 Medication Instructions:  Your physician has recommended you make the following change in your medication:  1-START Vasepa 2 g by mouth twice daily.  *If you need a refill on your cardiac medications before your next appointment, please call your pharmacy*  Lab Work: Your physician recommends that you have lab work at [facility] in 3 months for fasting lipid and liver panel  If you have labs (blood work) drawn today and your tests are completely normal, you will receive your results only by: [employer] (if you have MyChart) OR A paper copy in the mail If you have any lab test that is abnormal or we need to change your treatment, we will call you to review the results.  Testing/Procedures: Your physician has requested that you have a carotid duplex. This test is an ultrasound of the carotid arteries in your neck. It looks at blood flow through these arteries that supply the brain with blood. Allow one hour for this exam. There are no restrictions or special instructions.  Your physician has requested that you have an echocardiogram. Echocardiography is a painless test that uses sound waves to create images of your heart. It provides your doctor with information about the size and shape of your heart and how well your heart's chambers and valves are working. This procedure takes approximately one hour. There are no restrictions for this procedure. Please do NOT wear cologne, perfume, aftershave, or lotions (deodorant is allowed). Please arrive 15 minutes prior to your appointment time.  Please note: We ask at that you not bring children with you during ultrasound (echo/ vascular) testing. Due to room size and safety concerns, children are not allowed in the ultrasound rooms during exams. Our front office staff cannot provide observation of children in our lobby area while testing is being conducted. An adult accompanying a patient to their appointment will only be allowed in the ultrasound room  at the discretion of the ultrasound technician under special circumstances. We apologize for any inconvenience. Follow-Up: At [facility], you and your health needs are our priority.  As part of our continuing mission to provide you with exceptional heart care, we have created designated Provider Care Teams.  These Care Teams include your primary Cardiologist (physician) and Advanced Practice Providers (APPs -  Physician Assistants and Nurse Practitioners) who all work together to provide you with the care you need, when you need it.  We recommend signing up for the patient portal called "MyChart".  Sign up information is provided on this After Visit Summary.  MyChart is used to connect with patients for Virtual Visits (Telemedicine).  Patients are able to view lab/test results, encounter notes, upcoming appointments, etc.  Non-urgent messages can be sent to your provider as well.   To learn more about what you can do with MyChart, go to [email or web address].    Your next appointment:   1 year(s)  Provider:   Charlton Haws, MD     Other Instructions  You may also go to any of these [facility] locations:   [facility] - [address] ([facility]) - [address] [phone] [address]   [place] - [address]    [place]  - [address]    [place] - [address] - [address] [employer]  - [address] ([facility]  1st Floor: - Lobby - Registration  - Pharmacy  - Lab - Cafe  2nd Floor: - PV Lab - Diagnostic Testing (echo, CT, nuclear med)  3rd Floor: - Vacant  4th Floor: - TCTS (cardiothoracic surgery) - AFib Clinic - Structural Heart Clinic - Vascular Surgery  - Vascular Ultrasound  5th Floor: - [facility] (general and EP) - Clinical Pharmacy for coumadin, hypertension, lipid, weight-loss  medications, and med management appointments    Valet parking services will be available as well.

## 2024-02-23 ENCOUNTER — Ambulatory Visit (INDEPENDENT_AMBULATORY_CARE_PROVIDER_SITE_OTHER): Payer: BLUE CROSS/BLUE SHIELD | Admitting: Family Medicine

## 2024-02-23 ENCOUNTER — Encounter: Payer: Self-pay | Admitting: Family Medicine

## 2024-02-23 VITALS — BP 115/67 | HR 81 | Temp 99.0°F | Ht 73.75 in | Wt 244.4 lb

## 2024-02-23 DIAGNOSIS — E782 Mixed hyperlipidemia: Secondary | ICD-10-CM

## 2024-02-23 DIAGNOSIS — G8929 Other chronic pain: Secondary | ICD-10-CM | POA: Diagnosis not present

## 2024-02-23 DIAGNOSIS — R7303 Prediabetes: Secondary | ICD-10-CM

## 2024-02-23 MED ORDER — ZOSTER VAC RECOMB ADJUVANTED 50 MCG/0.5ML IM SUSR: 0.5000 mL | Freq: Once | INTRAMUSCULAR | 0 refills | Status: AC

## 2024-02-23 NOTE — Progress Notes (Signed)
 Office Note 02/23/2024  CC: No chief complaint on file.   HPI:  Anthony Paul is a 68 y.o. male who is here to establish care, follow-up prediabetes. Patient's most recent primary MD: Dr. Lucky Cowboy. Old records in epic/health Link were reviewed prior to or during today's visit.  He has no acute concerns. CPE was done at prior PCP office 10/31/23.  He saw his cardiologist 02/14/24 and was started on Vascepa. He has echocardiogram and carotid Dopplers arranged.  He is followed by [facility] pain management for atypical abdominal wall pain. He has lumbar spondylosis and is followed by [facility].  PMP AWARE reviewed today: most recent rx for Percocet 10-325 was filled 02/08/2024, # 180, rx by Dr. Salome Arnt. No red flags.  Past Medical History:  Diagnosis Date   Abnormal CT scan, chest    Blebs on CT chest but NORMAL PFTs- no COPD   Aortic stenosis    moderate (Dr. Eden Emms)   Ascending aorta dilatation [facility])    needs annual CTA (Dr. Eden Emms)   Bicuspid aortic valve    Echo 2014  EF 55-60% Mild to moderate AR mild AS with mean gradient 18 mmHg and peak of 25 mmHg. ? Bicuspid valve with dilated aortic root 4.0 cm.   BPH with obstruction/lower urinary tract symptoms    Chronic abdominal pain    Atypical features/unknown etiology.  Followed by [facility] pain clinic.   COPD (chronic obstructive pulmonary disease) (HCC)    diverticulosis    DJD (degenerative joint disease)    ED (erectile dysfunction)    Elevated blood pressure reading without diagnosis of hypertension    Hx of colonic polyp 2015   Insomnia    Lumbar spondylosis    Mixed hyperlipidemia    Prediabetes    Tobacco abuse     Past Surgical History:  Procedure Laterality Date   COLON SURGERY  12/2010   COLONOSCOPY  01/13/2014   hx multiple adenomas.  Most recent 01/14/20->1 polyp->needs q5 yr colonoscopy (Dr. Adela Lank)   HEMORROIDECTOMY     INGUINAL HERNIA REPAIR Right 2015   Dr. Gaynelle Adu   RESECTION  DISTAL CLAVICAL Right    TONSILLECTOMY     UPPER GASTROINTESTINAL ENDOSCOPY     UPPER GI ENDOSCOPY  2015   Dr. Kinnie Scales    Family History  Problem Relation Age of Onset   Heart disease Father    Benign prostatic hyperplasia Father    Diabetes Mother    Kidney disease Sister    Cancer Sister        in Spine   Colon cancer Neg Hx    Esophageal cancer Neg Hx    Stomach cancer Neg Hx    Rectal cancer Neg Hx     Social History   Socioeconomic History   Marital status: Married    Spouse name: Not on file   Number of children: 3   Years of education: Not on file   Highest education level: Not on file  Occupational History   Occupation: retired truck Retail banker: [employer]  Tobacco Use   Smoking status: Every Day    Current packs/day: 1.50    Average packs/day: 1.5 packs/day for 52.2 years (78.3 ttl pk-yrs)    Types: Cigarettes    Start date: 1973   Smokeless tobacco: Former    Types: Chew    Quit date: 12/19/2006   Tobacco comments:    Started smoking at age 45, current 1/2 ppd  Vaping Use  Vaping status: Never Used  Substance and Sexual Activity   Alcohol use: Yes    Alcohol/week: 5.0 standard drinks of alcohol    Types: 5 Standard drinks or equivalent per week    Comment: 1-2 drinks per day   Drug use: No   Sexual activity: Yes    Partners: Female  Other Topics Concern   Not on file  Social History Narrative   Married, 2 daughters local and 1 in [place].   Lives in [place] many years.   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   Likes to Insurance underwriter.   50+ pack-yr cig hx-->current as of 02/2024.   Social Drivers of Corporate investment banker Strain: Not on file  Food Insecurity: Not on file  Transportation Needs: Not on file  Physical Activity: Not on file  Stress: Not on file  Social Connections: Not on file  Intimate Partner Violence: Not on file    Outpatient Encounter Medications as of  02/23/2024  Medication Sig   acetaminophen (TYLENOL) 500 MG tablet Take 1,500 mg by mouth every 6 (six) hours as needed for mild pain (pain score 1-3).   albuterol (VENTOLIN HFA) 108 (90 Base) MCG/ACT inhaler INHALE 2 PUFFS INTO THE LUNGS EVERY 4 (FOUR) HOURS AS NEEDED FOR WHEEZING OR SHORTNESS OF BREATH. PLEASE GIVE GENERIC OR THE ONE THAT INSURANCE COVERS   aspirin 81 MG tablet Take 81 mg by mouth daily.   atorvastatin (LIPITOR) 40 MG tablet TAKE 1 TABLET BY MOUTH EVERY DAY   celecoxib (CELEBREX) 200 MG capsule Take 200 mg by mouth daily.   Cholecalciferol (VITAMIN D) 2000 UNITS CAPS Take 1 capsule by mouth daily. Takes 500 mg   Cyanocobalamin (VITAMIN B12 PO) Take by mouth daily.   Flaxseed, Linseed, (FLAXSEED OIL) 1200 MG CAPS Take 1 capsule by mouth daily.   icosapent Ethyl (VASCEPA) 1 g capsule Take 2 capsules (2 g total) by mouth 2 (two) times daily.   MOVANTIK 25 MG TABS tablet TAKE 1 TABLET DAILY FOR REGULAR BM'S   NARCAN 4 MG/0.1ML LIQD nasal spray kit    Omega-3 Fatty Acids (FISH OIL PO) Take 1 tablet by mouth daily.   oxyCODONE-acetaminophen (PERCOCET) 10-325 MG tablet Take 1 tablet by mouth as needed.   traZODone (DESYREL) 150 MG tablet TAKE 1/2 TO 1 TABLET 1 HOUR BEFORE BEDTIME FOR SLEEP   Zoster Vaccine Adjuvanted [facility]) injection Inject 0.5 mLs into the muscle once for 1 dose.   Facility-Administered Encounter Medications as of 02/23/2024  Medication   0.9 %  sodium chloride infusion   ipratropium-albuterol (DUONEB) 0.5-2.5 (3) MG/3ML nebulizer solution 3 mL    Allergies  Allergen Reactions   Morphine Itching    Can take hydrocodone without issues    Review of Systems  Constitutional:  Negative for appetite change, chills, fatigue and fever.  HENT:  Negative for congestion, dental problem, ear pain and sore throat.   Eyes:  Negative for discharge, redness and visual disturbance.  Respiratory:  Negative for cough, chest tightness, shortness of breath and wheezing.    Cardiovascular:  Negative for chest pain, palpitations and leg swelling.  Gastrointestinal:  Negative for abdominal pain, blood in stool, diarrhea, nausea and vomiting.  Genitourinary:  Negative for difficulty urinating, dysuria, flank pain, frequency, hematuria and urgency.  Musculoskeletal:  Negative for arthralgias, back pain, joint swelling, myalgias and neck stiffness.  Skin:  Negative for pallor and rash.  Neurological:  Negative for dizziness, speech difficulty, weakness and headaches.  Hematological:  Negative for adenopathy. Does not bruise/bleed easily.  Psychiatric/Behavioral:  Negative for confusion and sleep disturbance. The patient is not nervous/anxious.     PE; Blood pressure 115/67, pulse 81, temperature 99 F (37.2 C), temperature source Oral, height 6' 1.75" (1.873 m), weight 244 lb 6.4 oz (110.9 kg), SpO2 97%. Body mass index is 31.59 kg/m.  Physical Exam  Gen: Alert, well appearing.  Patient is oriented to person, place, time, and situation. AFFECT: pleasant, lucid thought and speech. ZOX:WRUE: no injection, icteris, swelling, or exudate.  EOMI, PERRLA. Mouth: lips without lesion/swelling.  Oral mucosa pink and moist. Oropharynx without erythema, exudate, or swelling.  Neck: Carotids 2+ bilaterally, no classic bruits but he does have transmission of his heart murmur into the carotids bilaterally. Cardiovascular: Regular rhythm and rate with 2-3/6 systolic murmur, no diastolic murmur. Lungs are clear bilaterally, breathing is nonlabored. Extremities: No cyanosis or edema.  Pertinent labs:  Last CBC Lab Results  Component Value Date   WBC 7.5 10/31/2023   HGB 15.0 10/31/2023   HCT 44.2 10/31/2023   MCV 93.8 10/31/2023   MCH 31.8 10/31/2023   RDW 12.2 10/31/2023   PLT 199 10/31/2023   Last metabolic panel Lab Results  Component Value Date   GLUCOSE 76 10/31/2023   NA 138 10/31/2023   K 4.7 10/31/2023   CL 103 10/31/2023   CO2 28 10/31/2023   BUN 23  10/31/2023   CREATININE 1.26 10/31/2023   EGFR 62 10/31/2023   CALCIUM 10.0 10/31/2023   PROT 6.8 10/31/2023   ALBUMIN 4.3 02/13/2019   BILITOT 0.5 10/31/2023   ALKPHOS 58 02/13/2019   AST 16 10/31/2023   ALT 20 10/31/2023   Last lipids Lab Results  Component Value Date   CHOL 147 10/31/2023   HDL 28 (L) 10/31/2023   LDLCALC 82 10/31/2023   TRIG 278 (H) 10/31/2023   CHOLHDL 5.3 (H) 10/31/2023   Last hemoglobin A1c Lab Results  Component Value Date   HGBA1C 6.2 (H) 10/31/2023   Last thyroid functions Lab Results  Component Value Date   TSH 1.60 10/31/2023   Last vitamin D Lab Results  Component Value Date   VD25OH 63 10/31/2023   Last vitamin B12 and Folate Lab Results  Component Value Date   VITAMINB12 249 10/31/2023   Lab Results  Component Value Date   TESTOSTERONE 305 05/02/2019   Lab Results  Component Value Date   PSA 0.58 10/31/2023   PSA 0.47 09/29/2022   PSA 0.24 09/29/2021   ASSESSMENT AND PLAN:   New patient, establishing care.  1.  Prediabetes. Check nonfasting glucose as well as hemoglobin A1c today.  2.  Tobacco dependence. Encouraged cessation.  3.  Mixed hyperlipidemia. Continue atorvastatin 40 mg a day. Vascepa was just added by his cardiologist.  #4 chronic pain syndrome. He has atypical abdominal pain, unknown etiology. He is followed by [facility] pain management.  #5 preventative health care: Next colonoscopy 2026. Next PSA November 2025. Shingrix prescription to pharmacy today. Otherwise vaccines are up-to-date. Lung cancer screening CT neg 09/2021.   He had CT angio chest aorta 02/2023 that showed no suspicious pulmonary lesions.  An After Visit Summary was printed and given to the patient.  Return in about 6 months (around 08/25/2024) for routine chronic illness f/u.  Signed:  Santiago Bumpers, MD           02/23/2024

## 2024-02-24 LAB — BASIC METABOLIC PANEL
BUN: 20 mg/dL (ref 7–25)
CO2: 29 mmol/L (ref 20–32)
Calcium: 10.3 mg/dL (ref 8.6–10.3)
Chloride: 105 mmol/L (ref 98–110)
Creat: 1.11 mg/dL (ref 0.70–1.35)
Glucose, Bld: 94 mg/dL (ref 65–99)
Potassium: 5 mmol/L (ref 3.5–5.3)
Sodium: 139 mmol/L (ref 135–146)

## 2024-02-24 LAB — HEMOGLOBIN A1C
Hgb A1c MFr Bld: 5.9 %{Hb} — ABNORMAL HIGH (ref <5.7)
Mean Plasma Glucose: 123 mg/dL
eAG (mmol/L): 6.8 mmol/L

## 2024-02-25 ENCOUNTER — Encounter: Payer: Self-pay | Admitting: Family Medicine

## 2024-03-19 ENCOUNTER — Ambulatory Visit (HOSPITAL_COMMUNITY)
Admission: RE | Admit: 2024-03-19 | Discharge: 2024-03-19 | Disposition: A | Discharge status: Home / Self Care | Payer: BLUE CROSS/BLUE SHIELD | Source: Ambulatory Visit | Attending: Cardiology | Admitting: Cardiology

## 2024-03-19 ENCOUNTER — Ambulatory Visit (HOSPITAL_BASED_OUTPATIENT_CLINIC_OR_DEPARTMENT_OTHER)
Admission: RE | Admit: 2024-03-19 | Discharge: 2024-03-19 | Disposition: A | Discharge status: Home / Self Care | Payer: BLUE CROSS/BLUE SHIELD | Source: Ambulatory Visit | Attending: Cardiovascular Disease | Admitting: Cardiovascular Disease

## 2024-03-19 DIAGNOSIS — I350 Nonrheumatic aortic (valve) stenosis: Secondary | ICD-10-CM | POA: Diagnosis present

## 2024-03-19 DIAGNOSIS — R0989 Other specified symptoms and signs involving the circulatory and respiratory systems: Secondary | ICD-10-CM | POA: Diagnosis present

## 2024-03-19 DIAGNOSIS — E785 Hyperlipidemia, unspecified: Secondary | ICD-10-CM

## 2024-03-19 DIAGNOSIS — Q2381 Bicuspid aortic valve: Secondary | ICD-10-CM

## 2024-03-19 DIAGNOSIS — E782 Mixed hyperlipidemia: Secondary | ICD-10-CM

## 2024-03-19 DIAGNOSIS — I351 Nonrheumatic aortic (valve) insufficiency: Secondary | ICD-10-CM

## 2024-03-19 LAB — ECHOCARDIOGRAM COMPLETE
AR max vel: 1.07 cm2
AV Area VTI: 0.93 cm2
AV Area mean vel: 0.9 cm2
AV Mean grad: 31 mmHg
AV Peak grad: 54 mmHg
Ao pk vel: 3.68 m/s
Area-P 1/2: 3.2 cm2
S' Lateral: 2.69 cm

## 2024-03-21 ENCOUNTER — Encounter: Payer: Self-pay | Admitting: Family Medicine

## 2024-03-22 ENCOUNTER — Telehealth: Payer: Self-pay

## 2024-03-22 DIAGNOSIS — Q2381 Bicuspid aortic valve: Secondary | ICD-10-CM

## 2024-03-22 DIAGNOSIS — I350 Nonrheumatic aortic (valve) stenosis: Secondary | ICD-10-CM

## 2024-03-22 DIAGNOSIS — I351 Nonrheumatic aortic (valve) insufficiency: Secondary | ICD-10-CM

## 2024-03-22 NOTE — Telephone Encounter (Signed)
-----   Message from Charlton Haws sent at 03/21/2024  4:45 PM EDT ----- Echo for AS can be in September. He likely has bicuspid valve and known dilated aorta 4.2 cm Have him get a TAVR cardiac CTA ( retrospective 0-90% reconstruction with beta blocker and nitroglycerin to look at coronary arteries scan from clavicles down ) Do not need the chest CTA/ or PV part ----- Message ----- From: Ethelda Chick, RN Sent: 03/21/2024   4:16 PM EDT To: Wendall Stade, MD  The patient has been notified of the result and verbalized understanding.  All questions (if any) were answered. Cindi Carbon [address] Runner, RN 03/21/2024 4:15 PM   Will clarify if Dr. Eden Emms wants echo in 6 months or an office visit. Patient stated he is suppose to see Dr. Eden Emms once a year.

## 2024-03-29 NOTE — Telephone Encounter (Signed)
 Patient called back. Informed him that an echo would be done in September.

## 2024-04-19 ENCOUNTER — Telehealth: Payer: Self-pay

## 2024-04-19 NOTE — Telephone Encounter (Signed)
 Copied from CRM [id number]. Topic: General - Other >> Apr 19, 2024 12:29 PM Deaijah H wrote: Reason for CRM: Patients wife called to have lab work completed advised orders would have to be placed then scheduled, wife then stated she already has the paperwork but with [facility] & would like to know where her husband can go with an [facility] order to have completed. Please call [phone]  Pt was advised if he already has lab requisitions he can go to a [facility] facility for lab draw. We would not be able to accept lab requisition, PCP would have to place orders.

## 2024-04-24 NOTE — Telephone Encounter (Signed)
 Last Fill: 07/29/23  Last OV: 02/23/24 Next OV: 08/26/24  Routing to provider for review/authorization.

## 2024-04-24 NOTE — Addendum Note (Signed)
 Addended by: Jinx Mourning on: 04/24/2024 04:56 PM   Modules accepted: Orders

## 2024-04-24 NOTE — Telephone Encounter (Unsigned)
 Copied from CRM [id number]. Topic: Clinical - Medication Refill >> Apr 24, 2024  4:29 PM Adonis Hoot wrote: Medication: traZODone  (DESYREL ) 150 MG tablet  Has the patient contacted their pharmacy? Yes (Agent: If no, request that the patient contact the pharmacy for the refill. If patient does not wish to contact the pharmacy document the reason why and proceed with request.) (Agent: If yes, when and what did the pharmacy advise?)  This is the patient's preferred pharmacy:  [id number] - [place], [place] - [address] [place] [address] Phone: [phone] Fax: [phone]  Is this the correct pharmacy for this prescription? Yes If no, delete pharmacy and type the correct one.   Has the prescription been filled recently? Yes  Is the patient out of the medication? Yes  Has the patient been seen for an appointment in the last year OR does the patient have an upcoming appointment? Yes  Can we respond through MyChart? Yes  Agent: Please be advised that Rx refills may take up to 3 business days. We ask that you follow-up with your pharmacy.

## 2024-04-25 MED ORDER — TRAZODONE HCL 150 MG PO TABS: ORAL_TABLET | ORAL | 3 refills | Status: AC

## 2024-06-13 ENCOUNTER — Ambulatory Visit: Payer: Medicare Other | Admitting: Nurse Practitioner

## 2024-08-05 ENCOUNTER — Ambulatory Visit: Payer: Self-pay | Admitting: *Deleted

## 2024-08-05 DIAGNOSIS — M79672 Pain in left foot: Secondary | ICD-10-CM

## 2024-08-05 NOTE — Telephone Encounter (Signed)
 FYI Only or Action Required?: Action required by provider: referral request.  Patient was last seen in primary care on 02/23/2024 by McGowen, Aleene DEL, MD.  Called Nurse Triage reporting Pain.  Symptoms began about a month ago.  Interventions attempted: Rest, hydration, or home remedies.  Symptoms are: gradually worsening.  Triage Disposition: See PCP When Office is Open (Within 3 Days)                 Copied from CRM #[id number]. Topic: Clinical - Red Word Triage >> Aug 05, 2024 10:28 AM Jayma L wrote: Red Word that prompted transfer to Nurse Triage: patient stated he's left heel hurts for past month or 6 weeks , its getting worse and hurts to walk on feet Reason for Disposition  [1] MODERATE pain (e.g., interferes with normal activities, limping) AND [2] present > 3 days  Answer Assessment - Initial Assessment Questions No available appt with PCP until Sept. Offered appt with other provider tomorrow and patient declined. Requesting referral to podiatrist. Patient requesting call back regarding if referral can be placed on home #      1. ONSET: When did the pain start?      Approx. Month- 6 weeks ago  2. LOCATION: Where is the pain located?      Bilateral heels 3. PAIN: How bad is the pain?    (Scale 1-10; or mild, moderate, severe)     4-5 /10 4. WORK OR EXERCISE: Has there been any recent work or exercise that involved this part of the body?      Walking causes worsening pain 5. CAUSE: What do you think is causing the foot pain?     Not sure  6. OTHER SYMPTOMS: Do you have any other symptoms? (e.g., leg pain, rash, fever, numbness)     Difficulty walking  7. PREGNANCY: Is there any chance you are pregnant? When was your last menstrual period?     Na  Protocols used: Foot Pain-A-AH

## 2024-08-05 NOTE — Telephone Encounter (Signed)
 OK pls order podiatry referral, dx bilateral heel pain

## 2024-08-06 NOTE — Telephone Encounter (Signed)
Spoke with patient's wife regarding results/recommendations.  

## 2024-08-26 ENCOUNTER — Ambulatory Visit (INDEPENDENT_AMBULATORY_CARE_PROVIDER_SITE_OTHER): Admitting: Family Medicine

## 2024-08-26 ENCOUNTER — Encounter: Payer: Self-pay | Admitting: Family Medicine

## 2024-08-26 VITALS — BP 101/66 | HR 79 | Temp 99.2°F | Ht 73.75 in | Wt 237.6 lb

## 2024-08-26 DIAGNOSIS — J449 Chronic obstructive pulmonary disease, unspecified: Secondary | ICD-10-CM | POA: Diagnosis not present

## 2024-08-26 DIAGNOSIS — R7303 Prediabetes: Secondary | ICD-10-CM

## 2024-08-26 DIAGNOSIS — E782 Mixed hyperlipidemia: Secondary | ICD-10-CM

## 2024-08-26 LAB — POCT GLYCOSYLATED HEMOGLOBIN (HGB A1C)
HbA1c POC (<> result, manual entry): 5.6 % (ref 4.0–5.6)
HbA1c, POC (controlled diabetic range): 5.6 % (ref 0.0–7.0)
HbA1c, POC (prediabetic range): 5.6 % — AB (ref 5.7–6.4)
Hemoglobin A1C: 5.6 % (ref 4.0–5.6)

## 2024-08-26 NOTE — Progress Notes (Signed)
 OFFICE VISIT  08/26/2024  CC:  Chief Complaint  Patient presents with   Medical Management of Chronic Issues    Patient is a 68 y.o. male who presents for 51-month follow-up prediabetes and HLD.  INTERIM HX: Feeling well. He does standard yard work without any dizziness, shortness of breath, chest pain, or palpitations.  He eats a liberal diet.  He does not do any formal cardiovascular exercise.  ROS as above, plus--> he has had left heel pain lately. no fevers, no wheezing, no cough,  no HAs, no rashes, no melena/hematochezia.  No polyuria or polydipsia.  No myalgias or arthralgias.  No focal weakness, paresthesias, or tremors.  No acute vision or hearing abnormalities.  No dysuria or unusual/new urinary urgency or frequency.  No recent changes in lower legs. No n/v/d or abd pain.   Past Medical History:  Diagnosis Date   Abnormal CT scan, chest    Blebs on CT chest but NORMAL PFTs- no COPD   Aortic stenosis    moderate (Dr. Delford)   Ascending aorta dilatation [facility])    needs annual CTA (Dr. Delford)   Bicuspid aortic valve    Echo 2014  EF 55-60% Mild to moderate AR mild AS with mean gradient 18 mmHg and peak of 25 mmHg. ? Bicuspid valve with dilated aortic root 4.0 cm.   BPH with obstruction/lower urinary tract symptoms    Chronic abdominal pain    Atypical features/unknown etiology.  Followed by [facility] pain clinic.   COPD (chronic obstructive pulmonary disease) (HCC)    diverticulosis    DJD (degenerative joint disease)    ED (erectile dysfunction)    Elevated blood pressure reading without diagnosis of hypertension    Hx of colonic polyp 2015   Insomnia    Lumbar spondylosis    Mixed hyperlipidemia    Prediabetes    Tobacco abuse     Past Surgical History:  Procedure Laterality Date   Carotid dopplers     03/2024 normal   COLON SURGERY  12/2010   COLONOSCOPY  01/13/2014   hx multiple adenomas.  Most recent 01/14/20->1 polyp->needs q5 yr colonoscopy (Dr.  Leigh)   HEMORROIDECTOMY     INGUINAL HERNIA REPAIR Right 2015   Dr. Camellia Blush   RESECTION DISTAL CLAVICAL Right    TONSILLECTOMY     TRANSTHORACIC ECHOCARDIOGRAM     03/2024, AS a bit worse (mod-to-severe), known bicuspid valve moderate AR   UPPER GASTROINTESTINAL ENDOSCOPY     UPPER GI ENDOSCOPY  2015   Dr. Luis    Outpatient Medications Prior to Visit  Medication Sig Dispense Refill   acetaminophen (TYLENOL) 500 MG tablet Take 1,500 mg by mouth every 6 (six) hours as needed for mild pain (pain score 1-3).     albuterol  (VENTOLIN  HFA) 108 (90 Base) MCG/ACT inhaler INHALE 2 PUFFS INTO THE LUNGS EVERY 4 (FOUR) HOURS AS NEEDED FOR WHEEZING OR SHORTNESS OF BREATH. PLEASE GIVE GENERIC OR THE ONE THAT INSURANCE COVERS 18 each 2   aspirin 81 MG tablet Take 81 mg by mouth daily.     atorvastatin  (LIPITOR) 40 MG tablet TAKE 1 TABLET BY MOUTH EVERY DAY 90 tablet 3   celecoxib  (CELEBREX ) 200 MG capsule Take 200 mg by mouth daily.     Cholecalciferol (VITAMIN D ) 2000 UNITS CAPS Take 1 capsule by mouth daily. Takes 500 mg     icosapent  Ethyl (VASCEPA ) 1 g capsule Take 2 capsules (2 g total) by mouth 2 (two) times daily.  120 capsule 11   MOVANTIK  25 MG TABS tablet TAKE 1 TABLET DAILY FOR REGULAR BM'S 90 tablet 3   NARCAN 4 MG/0.1ML LIQD nasal spray kit      oxyCODONE-acetaminophen (PERCOCET) 10-325 MG tablet Take 1 tablet by mouth as needed.     traZODone  (DESYREL ) 150 MG tablet Take      1/2 to 1 tablet       1 hour      before Bedtime for Sleep 90 tablet 3   Cyanocobalamin (VITAMIN B12 PO) Take by mouth daily. (Patient not taking: Reported on 08/26/2024)     Flaxseed, Linseed, (FLAXSEED OIL) 1200 MG CAPS Take 1 capsule by mouth daily. (Patient not taking: Reported on 08/26/2024)     Omega-3 Fatty Acids (FISH OIL PO) Take 1 tablet by mouth daily. (Patient not taking: Reported on 08/26/2024)     Facility-Administered Medications Prior to Visit  Medication Dose Route Frequency Provider Last Rate  Last Admin   0.9 %  sodium chloride  infusion  500 mL Intravenous Once Armbruster, Elspeth SQUIBB, MD       ipratropium-albuterol  (DUONEB) 0.5-2.5 (3) MG/3ML nebulizer solution 3 mL  3 mL Nebulization Once Jeanine Knee, NP        Allergies  Allergen Reactions   Morphine Itching    Can take hydrocodone without issues    Review of Systems As per HPI  PE:    08/26/2024    2:50 PM 02/23/2024    1:57 PM 02/14/2024    2:44 PM  Vitals with BMI  Height 6' 1.75 6' 1.75 6' 0  Weight 237 lbs 10 oz 244 lbs 6 oz 238 lbs 3 oz  BMI 30.72 31.6 32.3  Systolic 101 115 887  Diastolic 66 67 70  Pulse 79 81 86     Physical Exam  General: Alert and well-appearing. Affect is pleasant, thought and speech are lucid. No further exam today  LABS:  Last CBC Lab Results  Component Value Date   WBC 7.5 10/31/2023   HGB 15.0 10/31/2023   HCT 44.2 10/31/2023   MCV 93.8 10/31/2023   MCH 31.8 10/31/2023   RDW 12.2 10/31/2023   PLT 199 10/31/2023   Last metabolic panel Lab Results  Component Value Date   GLUCOSE 94 02/23/2024   NA 139 02/23/2024   K 5.0 02/23/2024   CL 105 02/23/2024   CO2 29 02/23/2024   BUN 20 02/23/2024   CREATININE 1.11 02/23/2024   EGFR 62 10/31/2023   CALCIUM  10.3 02/23/2024   PROT 6.8 10/31/2023   ALBUMIN 4.3 02/13/2019   BILITOT 0.5 10/31/2023   ALKPHOS 58 02/13/2019   AST 16 10/31/2023   ALT 20 10/31/2023   Last lipids Lab Results  Component Value Date   CHOL 147 10/31/2023   HDL 28 (L) 10/31/2023   LDLCALC 82 10/31/2023   TRIG 278 (H) 10/31/2023   CHOLHDL 5.3 (H) 10/31/2023   Last hemoglobin A1c Lab Results  Component Value Date   HGBA1C 5.6 08/26/2024   HGBA1C 5.6 08/26/2024   HGBA1C 5.6 (A) 08/26/2024   HGBA1C 5.6 08/26/2024   Last thyroid  functions Lab Results  Component Value Date   TSH 1.60 10/31/2023   Last vitamin D  Lab Results  Component Value Date   VD25OH 63 10/31/2023   Last vitamin B12 and Folate Lab Results  Component  Value Date   VITAMINB12 249 10/31/2023   Lab Results  Component Value Date   PSA 0.58 10/31/2023   PSA 0.47  09/29/2022   PSA 0.24 09/29/2021   IMPRESSION AND PLAN:  #1 prediabetes. POC Hba1c today is 5.6%. He will continue to work on diet and exercise.  2.  Mixed hyperlipidemia, doing well on atorvastatin  40 mg a day long-term.  He was put on Vascepa  2 g twice daily approximately 10 months ago. He will make a fasting lab appointment to recheck lipids and complete metabolic panel.  3.  Aortic stenosis, functionally bicuspid aortic valve. Asymptomatic. Last echo in April this year showed mild progression.  He has another echo scheduled for tomorrow.  4.  COPD. He only occasionally needs albuterol . He will call to request refill when his current inhaler runs out.  An After Visit Summary was printed and given to the patient.  FOLLOW UP: Return in about 3 months (around 11/25/2024) for annual CPE (fasting). Next CPE 10/2024 Signed:  Gerlene Hockey, MD           08/26/2024

## 2024-08-26 NOTE — Patient Instructions (Signed)
 Take 1000 microgram vitamin B12 once a day

## 2024-08-27 ENCOUNTER — Ambulatory Visit: Payer: Self-pay | Admitting: Cardiovascular Disease

## 2024-08-27 ENCOUNTER — Ambulatory Visit (HOSPITAL_COMMUNITY)
Admission: RE | Admit: 2024-08-27 | Discharge: 2024-08-27 | Disposition: A | Discharge status: Home / Self Care | Source: Ambulatory Visit | Attending: Internal Medicine | Admitting: Internal Medicine

## 2024-08-27 DIAGNOSIS — I350 Nonrheumatic aortic (valve) stenosis: Secondary | ICD-10-CM | POA: Diagnosis present

## 2024-08-27 DIAGNOSIS — I351 Nonrheumatic aortic (valve) insufficiency: Secondary | ICD-10-CM | POA: Insufficient documentation

## 2024-08-27 DIAGNOSIS — Q2381 Bicuspid aortic valve: Secondary | ICD-10-CM | POA: Diagnosis present

## 2024-08-27 LAB — ECHOCARDIOGRAM COMPLETE
AR max vel: 1.2 cm2
AV Area VTI: 1.19 cm2
AV Area mean vel: 1.17 cm2
AV Mean grad: 42 mmHg
AV Peak grad: 59.6 mmHg
Ao pk vel: 3.86 m/s
Area-P 1/2: 3.84 cm2
P 1/2 time: 361 ms
S' Lateral: 2.7 cm

## 2024-08-28 ENCOUNTER — Ambulatory Visit: Payer: Self-pay | Admitting: Family Medicine

## 2024-08-28 ENCOUNTER — Other Ambulatory Visit (INDEPENDENT_AMBULATORY_CARE_PROVIDER_SITE_OTHER)

## 2024-08-28 ENCOUNTER — Encounter: Payer: Self-pay | Admitting: Family Medicine

## 2024-08-28 DIAGNOSIS — R7303 Prediabetes: Secondary | ICD-10-CM

## 2024-08-28 DIAGNOSIS — E782 Mixed hyperlipidemia: Secondary | ICD-10-CM | POA: Diagnosis not present

## 2024-08-28 LAB — COMPREHENSIVE METABOLIC PANEL WITH GFR
ALT: 28 U/L (ref 0–53)
AST: 19 U/L (ref 0–37)
Albumin: 4.3 g/dL (ref 3.5–5.2)
Alkaline Phosphatase: 53 U/L (ref 39–117)
BUN: 18 mg/dL (ref 6–23)
CO2: 29 meq/L (ref 19–32)
Calcium: 10 mg/dL (ref 8.4–10.5)
Chloride: 105 meq/L (ref 96–112)
Creatinine, Ser: 1.23 mg/dL (ref 0.40–1.50)
GFR: 59.87 mL/min — ABNORMAL LOW (ref >60.00)
Glucose, Bld: 114 mg/dL — ABNORMAL HIGH (ref 70–99)
Potassium: 4.7 meq/L (ref 3.5–5.1)
Sodium: 141 meq/L (ref 135–145)
Total Bilirubin: 0.6 mg/dL (ref 0.2–1.2)
Total Protein: 6.6 g/dL (ref 6.0–8.3)

## 2024-08-28 LAB — LIPID PANEL
Cholesterol: 128 mg/dL (ref 0–200)
HDL: 30.3 mg/dL — ABNORMAL LOW (ref >39.00)
LDL Cholesterol: 61 mg/dL (ref 0–99)
NonHDL: 97.9
Total CHOL/HDL Ratio: 4
Triglycerides: 184 mg/dL — ABNORMAL HIGH (ref 0.0–149.0)
VLDL: 36.8 mg/dL (ref 0.0–40.0)

## 2024-09-05 NOTE — H&P (View-Only) (Signed)
 Patient ID: Anthony Paul, male   DOB: May 31, 1956, 68 y.o.   MRN: [id number]     68 y.o. referred by Dr Tonita for murmur in 2014 Diagnosed with bicuspid AV Sees dentist twice/yearr. Previous smoker. CRF elevated BP and cholesterol MRI 2013 with no coarctation and ascending aorta 3.9 cm   Echo 07/14/20 EF 60-65% mild AR moderate AS mean gradient 20 peak 32 mmHg DVI 0.42 AVA 1.5 cm2 Echo 01/31/23 EF 65-70% moderate AR/AS aortic root 45 mm mean gradient 27 peak 47.3 mmHg DVI 0.43  Echo 08/27/24 EF 55-60% moderate AR Severe AS AVA 1.2 cm2 peak gradient 59.6 mmHg mean 42 mmHg DVI 0.29  Retired Hospital doctor for [employer]  3 Daughters two in town and one in [place] Active with no dyspnea palpitations or syncope  Cardiac CT 12/11/18 functionally bicuspid AV with partial fusion left and right cusps. 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 Needs cardiac TAVR CTA and right and left cath to discuss SAVR/TAVR Given age, bicuspid valve and aortic diameter of 4.2 cm may benefit more from SAVR  He has had more dyspnea and some SSCP on right side in last 2 months    ROS: Denies fever, malais, weight loss, blurry vision, decreased visual acuity, cough, sputum, SOB, hemoptysis, pleuritic pain, palpitaitons, heartburn, abdominal pain, melena, lower extremity edema, claudication, or rash.  All other systems reviewed and negative  General: There were no vitals taken for this visit. Affect appropriate Healthy:  appears stated age HEENT: normal Neck supple with no adenopathy JVP normal no bruits no thyromegaly Lungs clear with no wheezing and good diaphragmatic motion Heart:  S1/S2  AS/AR  murmur radiates to both carotids , no rub, gallop or click PMI normal Abdomen: benighn, BS positve, no tenderness, no AAA no  bruit.  No HSM or HJR Distal pulses intact with no bruits No edema Neuro non-focal Skin warm and dry No muscular weakness     Current Outpatient Medications  Medication Sig Dispense Refill   acetaminophen (TYLENOL) 500 MG tablet Take 1,500 mg by mouth every 6 (six) hours as needed for mild pain (pain score 1-3).     albuterol  (VENTOLIN  HFA) 108 (90 Base) MCG/ACT inhaler INHALE 2 PUFFS INTO THE LUNGS EVERY 4 (FOUR) HOURS AS NEEDED FOR WHEEZING OR SHORTNESS OF BREATH. PLEASE GIVE GENERIC OR THE ONE THAT INSURANCE COVERS 18 each 2   aspirin 81 MG tablet Take 81 mg by mouth daily.     atorvastatin  (LIPITOR) 40 MG tablet TAKE 1 TABLET BY MOUTH EVERY DAY 90 tablet 3   celecoxib  (CELEBREX ) 200 MG capsule Take 200 mg by mouth daily.     Cholecalciferol (VITAMIN D ) 2000 UNITS CAPS Take 1 capsule by mouth daily. Takes 500 mg     Cyanocobalamin (VITAMIN B12 PO) Take by mouth daily. (Patient not taking: Reported on 08/26/2024)     icosapent  Ethyl (VASCEPA ) 1 g capsule Take 2 capsules (2 g total) by mouth 2 (two) times daily. 120 capsule 11   MOVANTIK  25 MG TABS tablet TAKE 1 TABLET DAILY FOR REGULAR BM'S 90 tablet 3   NARCAN 4 MG/0.1ML LIQD nasal spray kit      oxyCODONE-acetaminophen (PERCOCET) 10-325 MG tablet Take 1 tablet by mouth as needed.     traZODone  (DESYREL ) 150 MG  tablet Take      1/2 to 1 tablet       1 hour      before Bedtime for Sleep 90 tablet 3   Current Facility-Administered Medications  Medication Dose Route Frequency Provider Last Rate Last Admin   0.9 %  sodium chloride  infusion  500 mL Intravenous Once Anthony Paul, Anthony SQUIBB, Anthony Paul       ipratropium-albuterol  (DUONEB) 0.5-2.5 (3) MG/3ML nebulizer solution 3 mL  3 mL Nebulization Once Anthony Knee, Anthony Paul        Allergies  Morphine  Electrocardiogram: 06/23/20 SR rate 88 normal   Assessment and Plan  Bicuspid Aortic Valve:  severe AS, moderate AR dilated aorta 4.2 cm. Right and left cath with TAVR CTA refer to CVTS and  structural team after testing.   Aortic Dilatation: Ascending aorta 4.2 cm stable on CTA 03/01/23    GI:  Seeing pain clinic CT with no pathology low risk for SMA disease  F/u GI  Elevated Triglycerides:  improved on vascepa  278-> 184 continue low fat diet LDL 61  continue lipitor  CAD:  None noted on cardiac CT 12/11/18 On statin and ASA   COPD:  no active wheezing 70 pack year history lung cancer CT 10/16/21 no cancer Counseled on smoking cessation < 10 minutes lung fields ok on CTA or aorta 03/01/23   Bruits:  likely radiating murmur from AS Duplex 03/19/24 mild plaque no stenosis   Shared Decision Making/Informed Consent The risks [stroke (1 in 1000), death (1 in 1000), kidney failure [usually temporary] (1 in 500), bleeding (1 in 200), allergic reaction [possibly serious] (1 in 200)], benefits (diagnostic support and management of coronary artery disease) and alternatives of a cardiac catheterization were discussed in detail with Anthony Paul and she is willing to proceed.  F/U with me in a year   Pre cath labs TAVR CTA Right /left cath Refer to CVTS structural  Orders written   F/U after testing and decision made on TAVR vs SAVR   Anthony Paul

## 2024-09-05 NOTE — Progress Notes (Signed)
 Patient ID: Anthony Paul, male   DOB: May 31, 1956, 68 y.o.   MRN: [id number]     68 y.o. referred by Dr Tonita for murmur in 2014 Diagnosed with bicuspid AV Sees dentist twice/yearr. Previous smoker. CRF elevated BP and cholesterol MRI 2013 with no coarctation and ascending aorta 3.9 cm   Echo 07/14/20 EF 60-65% mild AR moderate AS mean gradient 20 peak 32 mmHg DVI 0.42 AVA 1.5 cm2 Echo 01/31/23 EF 65-70% moderate AR/AS aortic root 45 mm mean gradient 27 peak 47.3 mmHg DVI 0.43  Echo 08/27/24 EF 55-60% moderate AR Severe AS AVA 1.2 cm2 peak gradient 59.6 mmHg mean 42 mmHg DVI 0.29  Retired Hospital doctor for [employer]  3 Daughters two in town and one in [place] Active with no dyspnea palpitations or syncope  Cardiac CT 12/11/18 functionally bicuspid AV with partial fusion left and right cusps. 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 Needs cardiac TAVR CTA and right and left cath to discuss SAVR/TAVR Given age, bicuspid valve and aortic diameter of 4.2 cm may benefit more from SAVR  He has had more dyspnea and some SSCP on right side in last 2 months    ROS: Denies fever, malais, weight loss, blurry vision, decreased visual acuity, cough, sputum, SOB, hemoptysis, pleuritic pain, palpitaitons, heartburn, abdominal pain, melena, lower extremity edema, claudication, or rash.  All other systems reviewed and negative  General: There were no vitals taken for this visit. Affect appropriate Healthy:  appears stated age HEENT: normal Neck supple with no adenopathy JVP normal no bruits no thyromegaly Lungs clear with no wheezing and good diaphragmatic motion Heart:  S1/S2  AS/AR  murmur radiates to both carotids , no rub, gallop or click PMI normal Abdomen: benighn, BS positve, no tenderness, no AAA no  bruit.  No HSM or HJR Distal pulses intact with no bruits No edema Neuro non-focal Skin warm and dry No muscular weakness     Current Outpatient Medications  Medication Sig Dispense Refill   acetaminophen (TYLENOL) 500 MG tablet Take 1,500 mg by mouth every 6 (six) hours as needed for mild pain (pain score 1-3).     albuterol  (VENTOLIN  HFA) 108 (90 Base) MCG/ACT inhaler INHALE 2 PUFFS INTO THE LUNGS EVERY 4 (FOUR) HOURS AS NEEDED FOR WHEEZING OR SHORTNESS OF BREATH. PLEASE GIVE GENERIC OR THE ONE THAT INSURANCE COVERS 18 each 2   aspirin 81 MG tablet Take 81 mg by mouth daily.     atorvastatin  (LIPITOR) 40 MG tablet TAKE 1 TABLET BY MOUTH EVERY DAY 90 tablet 3   celecoxib  (CELEBREX ) 200 MG capsule Take 200 mg by mouth daily.     Cholecalciferol (VITAMIN D ) 2000 UNITS CAPS Take 1 capsule by mouth daily. Takes 500 mg     Cyanocobalamin (VITAMIN B12 PO) Take by mouth daily. (Patient not taking: Reported on 08/26/2024)     icosapent  Ethyl (VASCEPA ) 1 g capsule Take 2 capsules (2 g total) by mouth 2 (two) times daily. 120 capsule 11   MOVANTIK  25 MG TABS tablet TAKE 1 TABLET DAILY FOR REGULAR BM'S 90 tablet 3   NARCAN 4 MG/0.1ML LIQD nasal spray kit      oxyCODONE-acetaminophen (PERCOCET) 10-325 MG tablet Take 1 tablet by mouth as needed.     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 Right and left cath with TAVR CTA refer to CVTS and  structural team after testing.   Aortic Dilatation: Ascending aorta 4.2 cm stable on CTA 03/01/23    GI:  Seeing pain clinic CT with no pathology low risk for SMA disease  F/u GI  Elevated Triglycerides:  improved on vascepa  278-> 184 continue low fat diet LDL 61  continue lipitor  CAD:  None noted on cardiac CT 12/11/18 On statin and ASA   COPD:  no active wheezing 70 pack year history lung cancer CT 10/16/21 no cancer Counseled on smoking cessation < 10 minutes lung fields ok on CTA or aorta 03/01/23   Bruits:  likely radiating murmur from AS Duplex 03/19/24 mild plaque no stenosis   Shared Decision Making/Informed Consent The risks [stroke (1 in 1000), death (1 in 1000), kidney failure [usually temporary] (1 in 500), bleeding (1 in 200), allergic reaction [possibly serious] (1 in 200)], benefits (diagnostic support and management of coronary artery disease) and alternatives of a cardiac catheterization were discussed in detail with Anthony Paul and she is willing to proceed.  F/U with me in a year   Pre cath labs TAVR CTA Right /left cath Refer to CVTS structural  Orders written   F/U after testing and decision made on TAVR vs SAVR   Maude Emmer

## 2024-09-18 ENCOUNTER — Ambulatory Visit: Attending: Cardiovascular Disease | Admitting: Cardiovascular Disease

## 2024-09-18 ENCOUNTER — Encounter: Payer: Self-pay | Admitting: Cardiovascular Disease

## 2024-09-18 ENCOUNTER — Other Ambulatory Visit: Payer: Self-pay | Admitting: Cardiovascular Disease

## 2024-09-18 VITALS — BP 140/80 | HR 85 | Ht 74.0 in | Wt 235.0 lb

## 2024-09-18 DIAGNOSIS — Z01812 Encounter for preprocedural laboratory examination: Secondary | ICD-10-CM | POA: Diagnosis not present

## 2024-09-18 DIAGNOSIS — I350 Nonrheumatic aortic (valve) stenosis: Secondary | ICD-10-CM | POA: Diagnosis not present

## 2024-09-18 DIAGNOSIS — R931 Abnormal findings on diagnostic imaging of heart and coronary circulation: Secondary | ICD-10-CM | POA: Diagnosis present

## 2024-09-18 DIAGNOSIS — Q2381 Bicuspid aortic valve: Secondary | ICD-10-CM | POA: Diagnosis not present

## 2024-09-18 DIAGNOSIS — E785 Hyperlipidemia, unspecified: Secondary | ICD-10-CM | POA: Diagnosis not present

## 2024-09-18 NOTE — Patient Instructions (Addendum)
 Medication Instructions:  Your physician recommends that you continue on your current medications as directed. Please refer to the Current Medication list given to you today.  *If you need a refill on your cardiac medications before your next appointment, please call your pharmacy*  Lab Work: TODAY: CBC, BMET If you have labs (blood work) drawn today and your tests are completely normal, you will receive your results only by: MyChart Message (if you have MyChart) OR A paper copy in the mail If you have any lab test that is abnormal or we need to change your treatment, we will call you to review the results.  Testing/Procedures: TAVR CTA Your physician has requested that you have cardiac CT. Cardiac computed tomography (CT) is a painless test that uses an x-ray machine to take clear, detailed pictures of your heart. For further information please visit [email or web address] Please follow instruction sheet as given.  RIGHT/LEFT HEART CATH Your physician has requested that you have a cardiac catheterization. Cardiac catheterization is used to diagnose and/or treat various heart conditions. Doctors may recommend this procedure for a number of different reasons. The most common reason is to evaluate chest pain. Chest pain can be a symptom of coronary artery disease (CAD), and cardiac catheterization can show whether plaque is narrowing or blocking your heart's arteries. This procedure is also used to evaluate the valves, as well as measure the blood flow and oxygen levels in different parts of your heart. For further information please visit [email or web address] Please follow instruction sheet, as given.  Follow-Up: TO BE DETERMINED AFTER TESTING   Other Instructions     Cardiac Catheterization   You are scheduled for a Cardiac Catheterization on Friday, October 3 with Dr. [name].  1. Please arrive at the [facility] (Main Entrance A) at [facility]: [address]  [place], [place] [address] at 10:00 AM (This time is 2 hour(s) before your procedure to ensure your preparation).   Free valet parking service is available. You will check in at ADMITTING. The support person will be asked to wait in the waiting room.  It is OK to have someone drop you off and come back when you are ready to be discharged.        Special note: Every effort is made to have your procedure done on time. Please understand that emergencies sometimes delay scheduled procedures.  2. Diet: Light meals may be eaten up to 6 hours before scheduled procedures from 12N and after; please stop eating at 6:00 AM   Light meal consist of plain toast, fruit, light soups, crackers.  3. Hydration:You need to be well hydrated before your procedure. On October 3, you may drink approved liquids (see below) until 2 hours before the procedure, with 16 oz of water as your last intake.   List of approved liquids water, clear juice, clear tea, black coffee, fruit juices, non-citric and without pulp, carbonated beverages, Gatorade, Kool -Aid, plain Jello-O and plain ice popsicles.  4. Labs: TODAY (CBC, BMET)  5. Medication instructions in preparation for your procedure:   Contrast Allergy: No  On the morning of your procedure, take Aspirin 81 mg and any morning medicines NOT listed above.  You may use sips of water.  6. Plan to go home the same day, you will only stay overnight if medically necessary. 7. You MUST have a responsible adult to drive you home. 8. An adult MUST be with you the first 24 hours after you arrive home. 9. Bring  a current list of your medications, and the last time and date medication taken. 10. Bring ID and current insurance cards. 11.Please wear clothes that are easy to get on and off and wear slip-on shoes.  Thank you for allowing us  to care for you!   -- [facility]

## 2024-09-19 ENCOUNTER — Telehealth: Payer: Self-pay | Admitting: *Deleted

## 2024-09-19 LAB — BASIC METABOLIC PANEL WITH GFR
BUN/Creatinine Ratio: 12 (ref 10–24)
BUN: 15 mg/dL (ref 8–27)
CO2: 23 mmol/L (ref 20–29)
Calcium: 9.9 mg/dL (ref 8.6–10.2)
Chloride: 104 mmol/L (ref 96–106)
Creatinine, Ser: 1.21 mg/dL (ref 0.76–1.27)
Glucose: 71 mg/dL (ref 70–99)
Potassium: 4.7 mmol/L (ref 3.5–5.2)
Sodium: 141 mmol/L (ref 134–144)
eGFR: 65 mL/min/1.73 (ref >59)

## 2024-09-19 LAB — CBC
Hematocrit: 46.5 % (ref 37.5–51.0)
Hemoglobin: 15.4 g/dL (ref 13.0–17.7)
MCH: 32.2 pg (ref 26.6–33.0)
MCHC: 33.1 g/dL (ref 31.5–35.7)
MCV: 97 fL (ref 79–97)
Platelets: 181 x10E3/uL (ref 150–450)
RBC: 4.79 x10E6/uL (ref 4.14–5.80)
RDW: 12.5 % (ref 11.6–15.4)
WBC: 7.7 x10E3/uL (ref 3.4–10.8)

## 2024-09-19 NOTE — Telephone Encounter (Signed)
 Cardiac Catheterization scheduled at [facility] for: Friday September 20, 2024 12 Noon Arrival time [facility] Main Entrance A at: 10 AM  Diet:  -May have light meal until 6 AM. (6 hours before procedure time) Approved light meal consists of plain toast, fruit, light soups, crackers.  Hydration: -May drink clear liquids until 2 hours before the procedure.  Approved liquids: Water, clear tea, black coffee, fruit juices-non-citric and without pulp,Gatorade, plain Jello/popsicles.   -Please drink 16 oz of water 2 hours before procedure.  Medication instructions: -Usual morning medications can be taken including aspirin 81 mg.  Plan to go home the same day, you will only stay overnight if medically necessary.  You must have responsible adult to drive you home.  Someone must be with you the first 24 hours after you arrive home.  Reviewed procedure instructions with patient.

## 2024-09-20 ENCOUNTER — Other Ambulatory Visit: Payer: Self-pay

## 2024-09-20 ENCOUNTER — Encounter (HOSPITAL_COMMUNITY): Admission: RE | Disposition: A | Payer: Self-pay | Source: Home / Self Care | Attending: Cardiovascular Disease

## 2024-09-20 ENCOUNTER — Ambulatory Visit (HOSPITAL_COMMUNITY)
Admission: RE | Admit: 2024-09-20 | Discharge: 2024-09-20 | Disposition: A | Discharge status: Home / Self Care | Attending: Cardiovascular Disease | Admitting: Cardiovascular Disease

## 2024-09-20 DIAGNOSIS — J449 Chronic obstructive pulmonary disease, unspecified: Secondary | ICD-10-CM | POA: Diagnosis not present

## 2024-09-20 DIAGNOSIS — Z87891 Personal history of nicotine dependence: Secondary | ICD-10-CM | POA: Diagnosis not present

## 2024-09-20 DIAGNOSIS — I77819 Aortic ectasia, unspecified site: Secondary | ICD-10-CM | POA: Insufficient documentation

## 2024-09-20 DIAGNOSIS — Q2381 Bicuspid aortic valve: Secondary | ICD-10-CM | POA: Diagnosis not present

## 2024-09-20 DIAGNOSIS — Z7982 Long term (current) use of aspirin: Secondary | ICD-10-CM | POA: Diagnosis not present

## 2024-09-20 DIAGNOSIS — I2510 Atherosclerotic heart disease of native coronary artery without angina pectoris: Secondary | ICD-10-CM | POA: Insufficient documentation

## 2024-09-20 DIAGNOSIS — I350 Nonrheumatic aortic (valve) stenosis: Secondary | ICD-10-CM | POA: Insufficient documentation

## 2024-09-20 DIAGNOSIS — Q231 Congenital insufficiency of aortic valve: Secondary | ICD-10-CM | POA: Insufficient documentation

## 2024-09-20 DIAGNOSIS — Z79899 Other long term (current) drug therapy: Secondary | ICD-10-CM | POA: Insufficient documentation

## 2024-09-20 HISTORY — PX: RIGHT HEART CATH AND CORONARY ANGIOGRAPHY: CATH118264

## 2024-09-20 LAB — POCT I-STAT 7, (LYTES, BLD GAS, ICA,H+H)
Acid-base deficit: 3 mmol/L — ABNORMAL HIGH (ref 0.0–2.0)
Bicarbonate: 21.9 mmol/L (ref 20.0–28.0)
Calcium, Ion: 1.28 mmol/L (ref 1.15–1.40)
HCT: 41 % (ref 39.0–52.0)
Hemoglobin: 13.9 g/dL (ref 13.0–17.0)
O2 Saturation: 96 %
Potassium: 4.2 mmol/L (ref 3.5–5.1)
Sodium: 139 mmol/L (ref 135–145)
TCO2: 23 mmol/L (ref 22–32)
pCO2 arterial: 36.7 mmHg (ref 32–48)
pH, Arterial: 7.383 (ref 7.35–7.45)
pO2, Arterial: 82 mmHg — ABNORMAL LOW (ref 83–108)

## 2024-09-20 LAB — POCT I-STAT EG7
Acid-base deficit: 1 mmol/L (ref 0.0–2.0)
Bicarbonate: 24.3 mmol/L (ref 20.0–28.0)
Calcium, Ion: 1.28 mmol/L (ref 1.15–1.40)
HCT: 40 % (ref 39.0–52.0)
Hemoglobin: 13.6 g/dL (ref 13.0–17.0)
O2 Saturation: 63 %
Potassium: 4.2 mmol/L (ref 3.5–5.1)
Sodium: 139 mmol/L (ref 135–145)
TCO2: 26 mmol/L (ref 22–32)
pCO2, Ven: 43.2 mmHg — ABNORMAL LOW (ref 44–60)
pH, Ven: 7.358 (ref 7.25–7.43)
pO2, Ven: 34 mmHg (ref 32–45)

## 2024-09-20 LAB — GLUCOSE, CAPILLARY: Glucose-Capillary: 121 mg/dL — ABNORMAL HIGH (ref 70–99)

## 2024-09-20 SURGERY — RIGHT HEART CATH AND CORONARY ANGIOGRAPHY
Anesthesia: LOCAL

## 2024-09-20 MED ORDER — SODIUM CHLORIDE 0.9% FLUSH: 3.0000 mL | Freq: Two times a day (BID) | INTRAVENOUS | Status: DC

## 2024-09-20 MED ORDER — VERAPAMIL HCL 2.5 MG/ML IV SOLN
INTRAVENOUS | Status: DC | PRN
  Administered 2024-09-20: 10 mL via INTRA_ARTERIAL

## 2024-09-20 MED ORDER — FREE WATER: 500.0000 mL | Freq: Once | Status: DC

## 2024-09-20 MED ORDER — MIDAZOLAM HCL 2 MG/2ML IJ SOLN
INTRAMUSCULAR | Status: DC | PRN
  Administered 2024-09-20: 2 mg via INTRAVENOUS

## 2024-09-20 MED ORDER — LIDOCAINE HCL (PF) 1 % IJ SOLN
INTRAMUSCULAR | Status: AC
  Filled 2024-09-20: qty 30

## 2024-09-20 MED ORDER — IOHEXOL 350 MG/ML SOLN
INTRAVENOUS | Status: DC | PRN
  Administered 2024-09-20: 40 mL

## 2024-09-20 MED ORDER — ONDANSETRON HCL 4 MG/2ML IJ SOLN: 4.0000 mg | Freq: Four times a day (QID) | INTRAMUSCULAR | Status: DC | PRN

## 2024-09-20 MED ORDER — METOPROLOL TARTRATE 50 MG PO TABS: ORAL_TABLET | ORAL | 0 refills | Status: DC

## 2024-09-20 MED ORDER — HEPARIN SODIUM (PORCINE) 1000 UNIT/ML IJ SOLN
INTRAMUSCULAR | Status: AC
Start: 2024-09-20 — End: 2024-09-20
  Filled 2024-09-20: qty 10

## 2024-09-20 MED ORDER — SODIUM CHLORIDE 0.9% FLUSH: 3.0000 mL | INTRAVENOUS | Status: DC | PRN

## 2024-09-20 MED ORDER — ASPIRIN 81 MG PO CHEW: 81.0000 mg | CHEWABLE_TABLET | ORAL | Status: DC

## 2024-09-20 MED ORDER — HYDRALAZINE HCL 20 MG/ML IJ SOLN: 10.0000 mg | INTRAMUSCULAR | Status: DC | PRN

## 2024-09-20 MED ORDER — SODIUM CHLORIDE 0.9 % IV SOLN: 250.0000 mL | INTRAVENOUS | Status: DC | PRN

## 2024-09-20 MED ORDER — MIDAZOLAM HCL 2 MG/2ML IJ SOLN
INTRAMUSCULAR | Status: AC
  Filled 2024-09-20: qty 2

## 2024-09-20 MED ORDER — LABETALOL HCL 5 MG/ML IV SOLN: 10.0000 mg | INTRAVENOUS | Status: DC | PRN

## 2024-09-20 MED ORDER — HEPARIN (PORCINE) IN NACL 1000-0.9 UT/500ML-% IV SOLN
INTRAVENOUS | Status: DC | PRN
  Administered 2024-09-20: 1000 mL

## 2024-09-20 MED ORDER — VERAPAMIL HCL 2.5 MG/ML IV SOLN
INTRAVENOUS | Status: AC
  Filled 2024-09-20: qty 2

## 2024-09-20 MED ORDER — ACETAMINOPHEN 325 MG PO TABS: 650.0000 mg | ORAL_TABLET | ORAL | Status: DC | PRN

## 2024-09-20 MED ORDER — HEPARIN SODIUM (PORCINE) 1000 UNIT/ML IJ SOLN
INTRAMUSCULAR | Status: DC | PRN
  Administered 2024-09-20: 5000 [IU] via INTRAVENOUS

## 2024-09-20 MED ORDER — FENTANYL CITRATE (PF) 100 MCG/2ML IJ SOLN
INTRAMUSCULAR | Status: AC
  Filled 2024-09-20: qty 2

## 2024-09-20 MED ORDER — LIDOCAINE HCL (PF) 1 % IJ SOLN
INTRAMUSCULAR | Status: DC | PRN
  Administered 2024-09-20 (×2): 2 mL via INTRADERMAL

## 2024-09-20 MED ORDER — FENTANYL CITRATE (PF) 100 MCG/2ML IJ SOLN
INTRAMUSCULAR | Status: DC | PRN
  Administered 2024-09-20: 25 ug via INTRAVENOUS

## 2024-09-20 SURGICAL SUPPLY — 8 items
CATH 5FR JL3.5 JR4 ANG PIG MP (CATHETERS) IMPLANT
CATH BALLN WEDGE 5F 110CM (CATHETERS) IMPLANT
DEVICE RAD COMP TR BAND LRG (VASCULAR PRODUCTS) IMPLANT
GLIDESHEATH SLEND SS 6F .021 (SHEATH) IMPLANT
GUIDEWIRE INQWIRE 1.5J.035X260 (WIRE) IMPLANT
PACK CARDIAC CATHETERIZATION (CUSTOM PROCEDURE TRAY) ×1 IMPLANT
SET ATX-X65L (MISCELLANEOUS) IMPLANT
SHEATH GLIDE SLENDER 4/5FR (SHEATH) IMPLANT

## 2024-09-20 NOTE — Interval H&P Note (Signed)
 History and Physical Interval Note:  09/20/2024 10:52 AM  Anthony Paul  has presented today for surgery, with the diagnosis of aortic stenosis.  The various methods of treatment have been discussed with the patient and family. After consideration of risks, benefits and other options for treatment, the patient has consented to  Procedure(s): RIGHT/LEFT HEART CATH AND CORONARY ANGIOGRAPHY (N/A) as a surgical intervention.  The patient's history has been reviewed, patient examined, no change in status, stable for surgery.  I have reviewed the patient's chart and labs.  Questions were answered to the patient's satisfaction.     [name]

## 2024-09-22 ENCOUNTER — Encounter (HOSPITAL_COMMUNITY): Payer: Self-pay | Admitting: Cardiovascular Disease

## 2024-09-30 ENCOUNTER — Ambulatory Visit (HOSPITAL_COMMUNITY)
Admission: RE | Admit: 2024-09-30 | Discharge: 2024-09-30 | Disposition: A | Discharge status: Home / Self Care | Source: Ambulatory Visit | Attending: Cardiovascular Disease | Admitting: Cardiovascular Disease

## 2024-09-30 DIAGNOSIS — Q2381 Bicuspid aortic valve: Secondary | ICD-10-CM | POA: Diagnosis not present

## 2024-09-30 DIAGNOSIS — I700 Atherosclerosis of aorta: Secondary | ICD-10-CM | POA: Insufficient documentation

## 2024-09-30 DIAGNOSIS — J439 Emphysema, unspecified: Secondary | ICD-10-CM | POA: Diagnosis not present

## 2024-09-30 DIAGNOSIS — N400 Enlarged prostate without lower urinary tract symptoms: Secondary | ICD-10-CM | POA: Insufficient documentation

## 2024-09-30 DIAGNOSIS — I350 Nonrheumatic aortic (valve) stenosis: Secondary | ICD-10-CM | POA: Diagnosis present

## 2024-09-30 MED ORDER — IOHEXOL 350 MG/ML SOLN
100.0000 mL | Freq: Once | INTRAVENOUS | Status: AC | PRN
  Administered 2024-09-30: 100 mL via INTRAVENOUS

## 2024-10-10 NOTE — Progress Notes (Unsigned)
 [address]       [place] [address]             [phone]        Anthony Paul Medical Record #[id number] Date of Birth: May 25, 1956  Referring: Anthony Anthony BROCKS, MD Primary Care: Candise Aleene DEL, MD Primary Cardiologist:Peter Delford, MD  Chief Complaint:   No chief complaint on file.   History of Present Illness:     Anthony Paul is a 68 y.o. male who presents for surgical evaluation of severe aortic stenosis.  He was originally evaluated for TAVR, but was found to have a left main height of 8.4 mm.  In regards to his symptoms, he does admit to some shortness of breath with exertion but denies any chest pain or lightheadedness.  He sees a Education officer, community twice a year.   Past Medical History:  Diagnosis Date   Abnormal CT scan, chest    Blebs on CT chest but NORMAL PFTs- no COPD   Aortic stenosis    severe 08/2024 (Dr. Delford).  Aortic valve very calcified and thickened, functionally bicuspid.   Ascending aorta dilatation    needs annual CTA (Dr. Delford)   Bicuspid aortic valve    Echo 2014  EF 55-60% Mild to moderate AR mild AS with mean gradient 18 mmHg and peak of 25 mmHg. ? Bicuspid valve with dilated aortic root 4.0 cm.   BPH with obstruction/lower urinary tract symptoms    Chronic abdominal pain    Atypical features/unknown etiology.  Followed by [facility] pain clinic.   COPD (chronic obstructive pulmonary disease) (HCC)    diverticulosis    DJD (degenerative joint disease)    ED (erectile dysfunction)    Elevated blood pressure reading without diagnosis of hypertension    Hx of colonic polyp 2015   Insomnia    Lumbar spondylosis    Mixed hyperlipidemia    Prediabetes    Tobacco abuse     Past Surgical History:  Procedure Laterality Date   CARDIAC CATHETERIZATION     09/2024 no signif CAD.   Carotid dopplers     03/2024 normal   COLON SURGERY  12/2010   COLONOSCOPY  01/13/2014   hx multiple adenomas.  Most recent 01/14/20->1  polyp->needs q5 yr colonoscopy (Dr. Leigh)   HEMORROIDECTOMY     INGUINAL HERNIA REPAIR Right 2015   Dr. Camellia Blush   RESECTION DISTAL CLAVICAL Right    RIGHT HEART CATH AND CORONARY ANGIOGRAPHY N/A 09/20/2024   Procedure: RIGHT HEART CATH AND CORONARY ANGIOGRAPHY;  Surgeon: Wonda Sharper, MD;  Location: [facility] INVASIVE CV LAB;  Service: Cardiovascular;  Laterality: N/A;   TONSILLECTOMY     TRANSTHORACIC ECHOCARDIOGRAM     03/2024, AS a bit worse (mod-to-severe), known bicuspid valve moderate AR.  08/2024 AS severe, aortic dilatation to 44 mm, grd II DD, otherwise normal.   UPPER GASTROINTESTINAL ENDOSCOPY     UPPER GI ENDOSCOPY  2015   Dr. Luis    Social History:  Social History   Tobacco Use  Smoking Status Every Day   Current packs/day: 1.50   Average packs/day: 1.5 packs/day for 52.8 years (79.2 ttl pk-yrs)   Types: Cigarettes   Start date: 1973  Smokeless Tobacco Former   Types: Chew   Quit date: 12/19/2006  Tobacco Comments   Started smoking at age 60, current 1/2 ppd    Social History   Substance and Sexual Activity  Alcohol Use  Yes   Alcohol/week: 5.0 standard drinks of alcohol   Types: 5 Standard drinks or equivalent per week   Comment: 1-2 drinks per day     Allergies  Allergen Reactions   Morphine Itching    Can take hydrocodone without issues     Current Outpatient Medications  Medication Sig Dispense Refill   acetaminophen (TYLENOL) 500 MG tablet Take 1,000 mg by mouth every 6 (six) hours as needed for mild pain (pain score 1-3).     albuterol  (VENTOLIN  HFA) 108 (90 Base) MCG/ACT inhaler INHALE 2 PUFFS INTO THE LUNGS EVERY 4 (FOUR) HOURS AS NEEDED FOR WHEEZING OR SHORTNESS OF BREATH. PLEASE GIVE GENERIC OR THE ONE THAT INSURANCE COVERS 18 each 2   aspirin 81 MG tablet Take 81 mg by mouth daily.     atorvastatin  (LIPITOR) 40 MG tablet TAKE 1 TABLET BY MOUTH EVERY DAY 90 tablet 3   celecoxib  (CELEBREX ) 200 MG capsule Take 200 mg by mouth daily.      Cholecalciferol (VITAMIN D ) 2000 UNITS CAPS Take 2,000 Units by mouth daily.     Cyanocobalamin (VITAMIN B12 PO) Take 1 tablet by mouth daily.     icosapent  Ethyl (VASCEPA ) 1 g capsule Take 2 capsules (2 g total) by mouth 2 (two) times daily. 120 capsule 11   metoprolol  tartrate (LOPRESSOR ) 50 MG tablet Take one tablet by mouth as directed prior to CT scan 1 tablet 0   MOVANTIK  25 MG TABS tablet TAKE 1 TABLET DAILY FOR REGULAR BM'S 90 tablet 3   oxyCODONE-acetaminophen (PERCOCET) 10-325 MG tablet Take 1 tablet by mouth every 4 (four) hours as needed for pain.     traZODone  (DESYREL ) 150 MG tablet Take      1/2 to 1 tablet       1 hour      before Bedtime for Sleep 90 tablet 3   Current Facility-Administered Medications  Medication Dose Route Frequency Provider Last Rate Last Admin   0.9 %  sodium chloride  infusion  500 mL Intravenous Once Armbruster, Elspeth SQUIBB, MD       ipratropium-albuterol  (DUONEB) 0.5-2.5 (3) MG/3ML nebulizer solution 3 mL  3 mL Nebulization Once Jeanine Knee, NP        (Not in a hospital admission)   Family History  Problem Relation Age of Onset   Heart disease Father    Benign prostatic hyperplasia Father    Diabetes Mother    Kidney disease Sister    Cancer Sister        in Spine   Colon cancer Neg Hx    Esophageal cancer Neg Hx    Stomach cancer Neg Hx    Rectal cancer Neg Hx      Review of Systems:   Review of Systems  Constitutional:  Negative for malaise/fatigue.  Respiratory:  Positive for shortness of breath. Negative for cough.   Cardiovascular:  Negative for chest pain and leg swelling.  Neurological: Negative.       Physical Exam: BP 112/69 (BP Location: Left Arm)   Pulse 75   Resp 18   Ht 6' 2 (1.88 m)   Wt 234 lb (106.1 kg)   SpO2 97%   BMI 30.04 kg/m  Physical Exam Constitutional:      General: He is not in acute distress.    Appearance: He is not ill-appearing.  HENT:     Head: Normocephalic and atraumatic.  Eyes:      Extraocular Movements: Extraocular movements intact.  Cardiovascular:  Rate and Rhythm: Normal rate.  Pulmonary:     Effort: Pulmonary effort is normal. No respiratory distress.  Abdominal:     General: Abdomen is flat. There is no distension.  Musculoskeletal:        General: Normal range of motion.     Cervical back: Normal range of motion.  Skin:    General: Skin is warm and dry.  Neurological:     General: No focal deficit present.     Mental Status: He is alert and oriented to person, place, and time.       Diagnostic Studies & Laboratory data: Cardiac Studies & Procedures   ______________________________________________________________________________________________ CARDIAC CATHETERIZATION  CARDIAC CATHETERIZATION 09/20/2024  Conclusion 1.  Patent coronary arteries with minimal irregularities, no significant stenoses 2.  Normal right heart catheterization: RA mean 8 mmHg RV 28/8 mmHg PA 25/10 mean 17 mmHg Pulmonary wedge pressure A-wave 13, V wave 10, mean 9 mmHg Cardiac output 4.5 L/min, cardiac index 1.92 3.  Calcified bicuspid aortic valve with restricted leaflet mobility  Recommendations: Cardiac surgical consultation for aortic valve replacement.  Patient with no significant CAD.  Suspect the low cardiac index is spurious as the patient has no clinical evidence of low output.  Findings Coronary Findings Diagnostic  Dominance: Right  Left Main Vessel is angiographically normal. The left main is normal.  It divides into the LAD, intermediate branch, and left circumflex.  Left Anterior Descending The vessel exhibits minimal luminal irregularities. The LAD is patent to the apex there is a mild intramyocardial bridge in the midportion.  The vessel has no stenosis.  The diagonals are patent.  Ramus Intermedius Vessel is angiographically normal. Widely patent vessel with a focal bridging segment in the midportion  Left Circumflex The vessel exhibits minimal  luminal irregularities.  Right Coronary Artery The vessel exhibits minimal luminal irregularities. The RCA is dominant.  The vessel is widely patent throughout with no significant stenosis.  The PDA and PLA branches are patent.  Intervention  No interventions have been documented.   STRESS TESTS  EXERCISE TOLERANCE TEST (ETT) 10/10/2016  Interpretation Summary  Blood pressure demonstrated a normal response to exercise.  There was no ST segment deviation noted during stress.  No T wave inversion was noted during stress.  Stress test is negative for ischemia   ECHOCARDIOGRAM  ECHOCARDIOGRAM COMPLETE 08/27/2024  Narrative ECHOCARDIOGRAM REPORT    Patient Name:   Anthony Paul Date of Exam: 08/27/2024 Medical Rec #:  [id number]      Height:       73.7 in Accession #:    [id number]     Weight:       237.6 lb Date of Birth:  October 18, 1956       BSA:          2.332 m Patient Age:    68 years       BP:           101/66 mmHg Patient Gender: M              HR:           72 bpm. Exam Location:  [address]  Procedure: 2D Echo, Cardiac Doppler and Color Doppler (Both Spectral and Color Flow Doppler were utilized during procedure).  Indications:    I35.0 Aortic Stenosis  History:        Patient has prior history of Echocardiogram examinations, most recent 03/19/2024. COPD; Risk Factors:Dyslipidemia and Current Smoker.  Sonographer:    NaTashia  Rodgers-Jones RDCS Referring Phys: [id number] PETER C NISHAN  IMPRESSIONS   1. Left ventricular ejection fraction, by estimation, is 55 to 60%. The left ventricle has normal function. The left ventricle has no regional wall motion abnormalities. There is moderate concentric left ventricular hypertrophy. Left ventricular diastolic parameters are consistent with Grade II diastolic dysfunction (pseudonormalization). 2. Right ventricular systolic function is normal. The right ventricular size is normal. 3. The mitral valve is normal in structure. No  evidence of mitral valve regurgitation. No evidence of mitral stenosis. 4. The aortic valve is calcified. There is severe calcifcation of the aortic valve. There is severe thickening of the aortic valve. Aortic valve regurgitation is moderate. Severe aortic valve stenosis. Aortic regurgitation PHT measures 361 msec. Aortic valve area, by VTI measures 1.19 cm. Aortic valve mean gradient measures 42.0 mmHg. Aortic valve Vmax measures 3.86 m/s. 5. Aortic dilatation noted. There is moderate dilatation of the ascending aorta, measuring 44 mm. 6. The inferior vena cava is normal in size with greater than 50% respiratory variability, suggesting right atrial pressure of 3 mmHg.  FINDINGS Left Ventricle: Left ventricular ejection fraction, by estimation, is 55 to 60%. The left ventricle has normal function. The left ventricle has no regional wall motion abnormalities. The left ventricular internal cavity size was normal in size. There is moderate concentric left ventricular hypertrophy. Left ventricular diastolic parameters are consistent with Grade II diastolic dysfunction (pseudonormalization). Indeterminate filling pressures.  Right Ventricle: The right ventricular size is normal. No increase in right ventricular wall thickness. Right ventricular systolic function is normal.  Left Atrium: Left atrial size was normal in size.  Right Atrium: Right atrial size was normal in size.  Pericardium: There is no evidence of pericardial effusion.  Mitral Valve: The mitral valve is normal in structure. No evidence of mitral valve regurgitation. No evidence of mitral valve stenosis.  Tricuspid Valve: The tricuspid valve is normal in structure. Tricuspid valve regurgitation is not demonstrated. No evidence of tricuspid stenosis.  The aortic valve is calcified. There is severe calcifcation of the aortic valve. There is severe thickening of the aortic valve. Aortic valve regurgitation is moderate. Severe aortic  stenosis is present. .  Pulmonic Valve: The pulmonic valve was normal in structure. Pulmonic valve regurgitation is not visualized. No evidence of pulmonic stenosis.  Aorta: Aortic dilatation noted. There is moderate dilatation of the ascending aorta, measuring 44 mm.  Venous: The inferior vena cava is normal in size with greater than 50% respiratory variability, suggesting right atrial pressure of 3 mmHg.  IAS/Shunts: No atrial level shunt detected by color flow Doppler.   LEFT VENTRICLE PLAX 2D LVIDd:         4.50 cm   Diastology LVIDs:         2.70 cm   LV e' medial:    7.29 cm/s LV PW:         1.40 cm   LV E/e' medial:  12.7 LV IVS:        1.40 cm   LV e' lateral:   6.42 cm/s LVOT diam:     2.30 cm   LV E/e' lateral: 14.4 LV SV:         100 LV SV Index:   43 LVOT Area:     4.15 cm   RIGHT VENTRICLE             IVC RV Basal diam:  4.20 cm     IVC diam: 1.10 cm RV S prime:  19.00 cm/s TAPSE (M-mode): 2.4 cm  LEFT ATRIUM             Index        RIGHT ATRIUM           Index LA diam:        4.50 cm 1.93 cm/m   RA Area:     13.40 cm LA Vol (A2C):   67.8 ml 29.07 ml/m  RA Volume:   36.00 ml  15.44 ml/m LA Vol (A4C):   55.9 ml 23.97 ml/m LA Biplane Vol: 64.7 ml 27.74 ml/m AORTIC VALVE AV Area (Vmax):    1.20 cm AV Area (Vmean):   1.17 cm AV Area (VTI):     1.19 cm AV Vmax:           386.00 cm/s AV Vmean:          271.600 cm/s AV VTI:            0.844 m AV Peak Grad:      59.6 mmHg AV Mean Grad:      42.0 mmHg LVOT Vmax:         111.67 cm/s LVOT Vmean:        76.567 cm/s LVOT VTI:          0.241 m LVOT/AV VTI ratio: 0.29 AI PHT:            361 msec  AORTA Ao Root diam: 3.80 cm Ao Asc diam:  4.40 cm  MITRAL VALVE MV Area (PHT): 3.84 cm    SHUNTS MV Decel Time: 198 msec    Systemic VTI:  0.24 m MV E velocity: 92.50 cm/s  Systemic Diam: 2.30 cm MV A velocity: 81.75 cm/s MV E/A ratio:  1.13  Annabella Scarce MD Electronically signed by Annabella Scarce MD Signature Date/Time: 08/27/2024/7:29:26 PM    Final      CT SCANS  CT CORONARY MORPH W/CTA COR W/SCORE 09/30/2024  Addendum 10/02/2024 11:44 PM ADDENDUM REPORT: 10/02/2024 23:41  EXAM: OVER-READ INTERPRETATION  CT CHEST  The following report is an over-read performed by radiologist Dr. Franky Leff [facility] on 10/02/2024. This over-read does not include interpretation of cardiac or coronary anatomy or pathology. The coronary CTA interpretation by the cardiologist is attached.  COMPARISON:  03/01/2023  FINDINGS: Aneurysmal dilatation of the ascending thoracic aorta measuring up to 4.2 cm. Insert calcified aortic valve. No adenopathy. No confluent airspace opacities or effusions. No acute findings in the upper abdomen. Chest wall soft tissues are unremarkable. No acute bony abnormality.  IMPRESSION: Ascending thoracic aortic aneurysm.  No acute extra cardiac abnormality.   Electronically Signed By: Franky Crease M.D. On: 10/02/2024 23:41  Narrative CLINICAL DATA:  Severe Aortic Stenosis.  EXAM: Cardiac TAVR CT  TECHNIQUE: A non-contrast, gated CT scan was obtained with axial slices of 2.5 mm through the heart for aortic valve scoring. A 100 kV retrospective, gated, contrast cardiac scan was obtained. Gantry rotation speed was 230 msec and collimation was 0.63 mm. Nitroglycerin  was not given. The 3D dataset was reconstructed in systole with motion correction. The 3D data set was reconstructed in 5% intervals of the 0-95% of the R-R cycle. Systolic and diastolic phases were analyzed on a dedicated workstation using MPR, MIP, and VRT modes. The patient received 100 cc of contrast.  FINDINGS: Image quality: Excellent.  Noise artifact is: Limited.  Valve Morphology: Bicuspid aortic valve with fusion of the RCC/LCC with raphe. Bulky calcification of the raphe. Restricted  leaflet motion in systole.  Aortic Valve Calcium  score:  2296  Aortic annular dimension:  Phase assessed: 25%  Annular area: 553 mm2  Annular perimeter: 85.1 mm  Max diameter: 29.6 mm  Min diameter: 24.2 mm  Annular and subannular calcification: None.  Membranous septum length: 11.8 mm  Optimal coplanar projection: RAO 16 CAU 10  Coronary Artery Height above Annulus:  Left Main: 8.4 mm  Right Coronary: 17.8 mm  Sinus of Valsalva Measurements:  Non-coronary: 36 mm  Right-coronary: 37 mm  Left-coronary: 38 mm  Max diameter: 40 mm cusp to cusp  Sinus of Valsalva Height:  Non-coronary: 24.2 mm  Right-coronary: 22.4 mm  Left-coronary: 18.8 mm  Sinotubular Junction: 38 mm  Ascending Thoracic Aorta: 44 mm (double oblique).  Coronary Arteries: Normal coronary origin. Right dominance. The study was performed without use of NTG and is insufficient for plaque evaluation. Please refer to recent cardiac catheterization for coronary assessment.  Cardiac Morphology:  Right Atrium: Right atrial size is within normal limits.  Right Ventricle: The right ventricular cavity is within normal limits.  Left Atrium: Left atrial size is normal in size with no left atrial appendage filling defect.  Left Ventricle: The ventricular cavity size is within normal limits.  Pulmonary arteries: Normal in size without proximal filling defect.  Pulmonary veins: Normal pulmonary venous drainage.  Pericardium: Normal thickness with no significant effusion or calcium  present.  Mitral Valve: The mitral valve is normal structure without significant calcification.  Extra-cardiac findings: See attached radiology report for non-cardiac structures.  IMPRESSION: 1. Bicuspid aortic valve with fusion of the RCC/LCC with raphe. Bulky calcification of the raphe noted.  2. Annular measurements support a 29 mm S3 or 34 mm Evolut Pro.  3. No significant annular or subannular calcifications.  4. Shallow left main height (8.4 mm). Structural  heart team discussion recommended.  5. Optimal Fluoroscopic Angle for Delivery: RAO 16 CAU 10  6. Moderate dilated of the ascending aorta up to 44 mm.  Darryle T. Barbaraann, MD  Electronically Signed: By: Darryle Barbaraann M.D. On: 10/02/2024 20:08   CT SCANS  CT CORONARY MORPH W/CTA COR W/SCORE 12/11/2018  Addendum 12/11/2018  2:02 PM ADDENDUM REPORT: 12/11/2018 13:59  CLINICAL DATA:  Chest pain  EXAM: Cardiac CTA  MEDICATIONS: Sub lingual nitro. 4 mg and lopressor  5mg   TECHNIQUE: The patient was scanned on a [employer] 192 scanner. Gantry rotation speed was 250 msecs. Collimation was. 6 mm . A 120 kV prospective scan was triggered in the ascending thoracic aorta at 140 HU's with full mA between 30-70% of the R-R interval . Average HR during the scan was 54 bpm. The 3D data set was interpreted on a dedicated work station using MPR, MIP and VRT modes. A total of 80 cc of contrast was used.  FINDINGS: Non-cardiac: See separate report from [facility]. No significant findings on limited lung and soft tissue windows.  Aortic valve was tri leaflet with severely calcified commissure between right and left cusps making motion functionally bicuspid  Ascending aortic root: 3.8 cm  STJ: 3.0 cm  Average Sinus 3.2 cm  Arch: 2.9 cm with normal branch vessels  Descending thoracic aorta: 2.2 cm  Calcium  score: Not done  Coronary Arteries: Right dominant with no anomalies  LM: Normal  LAD: Normal  IM: Normla  D1: Normal  Circumflex: Normal  OM1: Normal  OM2: Normal  RCA: Normal  PDA: Normal  PLA: Normal  IMPRESSION: 1. Functionally bicuspid AV with partial fusion  of left and right cusps. Recommend following degree of AS with echo  2.  Upper normal aortic root size 3.8 cm with normal arch vessels  3.  Normal right dominant coronary arteries  Anthony Paul   Electronically Signed By: Anthony Paul M.D. On: 12/11/2018  13:59  Narrative EXAM: OVER-READ INTERPRETATION  CT CHEST  The following report is an over-read performed by radiologist Dr. Franky Crease of [facility] on 12/11/2018. This over-read does not include interpretation of cardiac or coronary anatomy or pathology. The coronary CTA interpretation by the cardiologist is attached.  COMPARISON:  05/28/2012  FINDINGS: Vascular: Ascending aorta has a maximum diameter of 3.8 cm. Heart is normal size.  Mediastinum/Nodes: No adenopathy in the lower mediastinum or hila.  Lungs/Pleura: Dependent atelectasis.  No effusions.  Upper Abdomen: Imaging into the upper abdomen shows no acute findings.  Musculoskeletal: Chest wall soft tissues are unremarkable. No acute bony abnormality.  IMPRESSION: Ascending aorta upper limits normal in diameter at 3.8 cm. No acute extra cardiac abnormality.  Electronically Signed: By: Franky Crease M.D. On: 12/11/2018 11:26   CARDIAC MRI  MR CARDIAC MORPHOLOGY W WO CONTRAST 07/03/2012  Narrative Cardiac MRI/Aortic MRA  Indication: ? Bicuspid Aortic Valve R/O Aneurysm  Protocol:  The patient was scanned on a 1.5 Tesla GE magnet. Functional imaging was done using Fiesta sequences.  MRA and delayed enhancement images were done after 25 cc of Mulithance. Aortic root measurements were made axially using IIR black blood sequences as well as in LAO oblique on MRA.  Quantitative EF and regurgitant fraction were calculated on a dedicated [employer] work station  Findings:  All 4 cardiac chambers were normal in size and function. Quantitative EF was 60% (EDV 170cc, ESV 68cc, SV 102cc) There was no hyperenhancement or scar tissue  The Aortic valve was functional type one bicuspid with fusion of the right and left cusps.  Valve opening was minimally restricted and mean gradient was only 16 mmHg by recent echo.  Regurgitant fraction was estimated at 25% suggesting mild AR although  Fiesta sequences suggested more moderate visual AR.  AR was central from poor leaflet coaptation  Aortic MRA:  There was mild ascending aortic root dilatation at 3.9 cm at the level of the RPA.  The sinus of valsalva, arch and descending thoracic aorta was normal.  Descending aorta measures 2.3 cm.  The great vessels arose normally from the arch. There was no evendence of coarctation.  Impression:  1)    Normal LV size and function EF 60%  2)    No hyperenhancement 3)    Functionally type one bicuspid AV with mild AS and mild AR by regurgitant fraction although qualitatively looked more moderate 4)    Mild dilatation of ascending aortic root 3.9 cm with no coarctation  Cc: Dr Ethan Schilling  Original Report Authenticated By: [email or web address]   ______________________________________________________________________________________________     EKG: Sinus I have independently reviewed the above radiologic studies and discussed with the patient   Recent Lab Findings: Lab Results  Component Value Date   WBC 7.7 09/18/2024   HGB 13.6 09/20/2024   HGB 13.9 09/20/2024   HCT 40.0 09/20/2024   HCT 41.0 09/20/2024   PLT 181 09/18/2024   GLUCOSE 71 09/18/2024   CHOL 128 08/28/2024   TRIG 184.0 (H) 08/28/2024   HDL 30.30 (L) 08/28/2024   LDLCALC 61 08/28/2024   ALT 28 08/28/2024   AST 19 08/28/2024   NA 139 09/20/2024  NA 139 09/20/2024   K 4.2 09/20/2024   K 4.2 09/20/2024   CL 104 09/18/2024   CREATININE 1.21 09/18/2024   BUN 15 09/18/2024   CO2 23 09/18/2024   TSH 1.60 10/31/2023   INR 0.96 12/23/2010   HGBA1C 5.6 08/26/2024   HGBA1C 5.6 08/26/2024   HGBA1C 5.6 (A) 08/26/2024   HGBA1C 5.6 08/26/2024    Intervention   Assessment / Plan:   68 y.o. male with severe aortic stenosis, and moderated AI.  No evidence of coronary disease.  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  His left main ostium is low at 8.4cm, thus he would not be a good candidate for TAVR.  We  discussed the risks and benefits of a aortic valve replacement with a tissue valve.  I do not think it 4.3 cm anything will be needed for his aneurysm.  He would like to proceed with surgery in the new year.  I will see him back in a month for further discussion.     I  spent 40 minutes counseling the patient face to face.   Linnie MALVA Rayas 10/11/2024 2:54 PM

## 2024-10-11 ENCOUNTER — Encounter: Payer: Self-pay | Admitting: Family Medicine

## 2024-10-11 ENCOUNTER — Ambulatory Visit
Attending: Thoracic Surgery (Cardiothoracic Vascular Surgery) | Admitting: Thoracic Surgery (Cardiothoracic Vascular Surgery)

## 2024-10-11 ENCOUNTER — Ambulatory Visit (INDEPENDENT_AMBULATORY_CARE_PROVIDER_SITE_OTHER): Admitting: Family Medicine

## 2024-10-11 VITALS — BP 104/64 | HR 71 | Temp 98.2°F | Ht 74.0 in | Wt 235.0 lb

## 2024-10-11 VITALS — BP 112/69 | HR 75 | Resp 18 | Ht 74.0 in | Wt 234.0 lb

## 2024-10-11 DIAGNOSIS — K5792 Diverticulitis of intestine, part unspecified, without perforation or abscess without bleeding: Secondary | ICD-10-CM | POA: Diagnosis not present

## 2024-10-11 DIAGNOSIS — I350 Nonrheumatic aortic (valve) stenosis: Secondary | ICD-10-CM | POA: Diagnosis present

## 2024-10-11 DIAGNOSIS — R1031 Right lower quadrant pain: Secondary | ICD-10-CM | POA: Diagnosis not present

## 2024-10-11 MED ORDER — AMOXICILLIN-POT CLAVULANATE 875-125 MG PO TABS: 1.0000 | ORAL_TABLET | Freq: Two times a day (BID) | ORAL | 0 refills | Status: DC

## 2024-10-11 NOTE — Progress Notes (Addendum)
 OFFICE VISIT  10/11/2024  CC:  Chief Complaint  Patient presents with   Abdominal Pain    Lower; occurring 1.5 months, not effecting urination or BM. Excess amount of gas in the evenings, still taking Movantik  2.5 d/t use of Oxycodone    Patient is a 68 y.o. male who presents for lower abdominal pain.  HPI: He has noted about 2 months of right lower quadrant abdominal pain with excessive gas and lower GI.  His worst symptoms are in the evening. No specific triggering or alleviating factors. No fevers, no significant problems with constipation lately. No blood in stool. No dysuria, urinary urgency, urinary frequency, or blood in urine. He sees no bulge in the groin.  He does have a remote history of severe diverticulitis that required resection of 8 inches of his colon (approximately 2012).  Review of systems: No nausea, vomiting, diarrhea, rash, or weakness.    Past Medical History:  Diagnosis Date   Abnormal CT scan, chest    Blebs on CT chest but NORMAL PFTs- no COPD   Aortic stenosis    severe 08/2024 (Dr. Delford).  Aortic valve very calcified and thickened, functionally bicuspid.   Ascending aorta dilatation    needs annual CTA (Dr. Delford)   Bicuspid aortic valve    Echo 2014  EF 55-60% Mild to moderate AR mild AS with mean gradient 18 mmHg and peak of 25 mmHg. ? Bicuspid valve with dilated aortic root 4.0 cm.   BPH with obstruction/lower urinary tract symptoms    Chronic abdominal pain    Atypical features/unknown etiology.  Followed by [facility] pain clinic.   COPD (chronic obstructive pulmonary disease) (HCC)    diverticulosis    DJD (degenerative joint disease)    ED (erectile dysfunction)    Elevated blood pressure reading without diagnosis of hypertension    Hx of colonic polyp 2015   Insomnia    Lumbar spondylosis    Mixed hyperlipidemia    Prediabetes    Tobacco abuse     Past Surgical History:  Procedure Laterality Date   CARDIAC CATHETERIZATION      09/2024 no signif CAD.   Carotid dopplers     03/2024 normal   COLON SURGERY  12/2010   COLONOSCOPY  01/13/2014   hx multiple adenomas.  Most recent 01/14/20->1 polyp->needs q5 yr colonoscopy (Dr. Leigh)   HEMORROIDECTOMY     INGUINAL HERNIA REPAIR Right 2015   Dr. Camellia Blush   RESECTION DISTAL CLAVICAL Right    RIGHT HEART CATH AND CORONARY ANGIOGRAPHY N/A 09/20/2024   Procedure: RIGHT HEART CATH AND CORONARY ANGIOGRAPHY;  Surgeon: Wonda Sharper, MD;  Location: [facility] INVASIVE CV LAB;  Service: Cardiovascular;  Laterality: N/A;   TONSILLECTOMY     TRANSTHORACIC ECHOCARDIOGRAM     03/2024, AS a bit worse (mod-to-severe), known bicuspid valve moderate AR.  08/2024 AS severe, aortic dilatation to 44 mm, grd II DD, otherwise normal.   UPPER GASTROINTESTINAL ENDOSCOPY     UPPER GI ENDOSCOPY  2015   Dr. Luis    Outpatient Medications Prior to Visit  Medication Sig Dispense Refill   acetaminophen (TYLENOL) 500 MG tablet Take 1,000 mg by mouth every 6 (six) hours as needed for mild pain (pain score 1-3).     albuterol  (VENTOLIN  HFA) 108 (90 Base) MCG/ACT inhaler INHALE 2 PUFFS INTO THE LUNGS EVERY 4 (FOUR) HOURS AS NEEDED FOR WHEEZING OR SHORTNESS OF BREATH. PLEASE GIVE GENERIC OR THE ONE THAT INSURANCE COVERS 18 each 2  aspirin 81 MG tablet Take 81 mg by mouth daily.     atorvastatin  (LIPITOR) 40 MG tablet TAKE 1 TABLET BY MOUTH EVERY DAY 90 tablet 3   celecoxib  (CELEBREX ) 200 MG capsule Take 200 mg by mouth daily.     Cholecalciferol (VITAMIN D ) 2000 UNITS CAPS Take 2,000 Units by mouth daily.     Cyanocobalamin (VITAMIN B12 PO) Take 1 tablet by mouth daily.     icosapent  Ethyl (VASCEPA ) 1 g capsule Take 2 capsules (2 g total) by mouth 2 (two) times daily. 120 capsule 11   metoprolol  tartrate (LOPRESSOR ) 50 MG tablet Take one tablet by mouth as directed prior to CT scan 1 tablet 0   MOVANTIK  25 MG TABS tablet TAKE 1 TABLET DAILY FOR REGULAR BM'S 90 tablet 3   oxyCODONE-acetaminophen  (PERCOCET) 10-325 MG tablet Take 1 tablet by mouth every 4 (four) hours as needed for pain.     traZODone  (DESYREL ) 150 MG tablet Take      1/2 to 1 tablet       1 hour      before Bedtime for Sleep 90 tablet 3   Facility-Administered Medications Prior to Visit  Medication Dose Route Frequency Provider Last Rate Last Admin   0.9 %  sodium chloride  infusion  500 mL Intravenous Once Armbruster, Elspeth SQUIBB, MD       ipratropium-albuterol  (DUONEB) 0.5-2.5 (3) MG/3ML nebulizer solution 3 mL  3 mL Nebulization Once Jeanine Knee, NP        Allergies  Allergen Reactions   Morphine Itching    Can take hydrocodone without issues    Review of Systems  As per HPI  PE:    10/11/2024    3:27 PM 10/11/2024   12:31 PM 09/30/2024    9:53 AM  Vitals with BMI  Height 6' 2 6' 2   Weight 235 lbs 234 lbs   BMI 30.16 30.03   Systolic 104 112 860  Diastolic 64 69 73  Pulse 71 75 76     Physical Exam  Gen: Alert, well appearing.  Patient is oriented to person, place, time, and situation. AFFECT: pleasant, lucid thought and speech. ABD: soft, nondistended.  +TTP RLQ extending down over inguinal ligament region.  No guarding.  No rebound tenderness.  No mass or hepatosplenomegaly.  Bowel sounds normal. No hernia. No signif worsening of the pain with resisted R hip flexion. No pain with R hip ER but mild pain with IR.  LABS:  Last CBC Lab Results  Component Value Date   WBC 7.7 09/18/2024   HGB 13.6 09/20/2024   HGB 13.9 09/20/2024   HCT 40.0 09/20/2024   HCT 41.0 09/20/2024   MCV 97 09/18/2024   MCH 32.2 09/18/2024   RDW 12.5 09/18/2024   PLT 181 09/18/2024   Last metabolic panel Lab Results  Component Value Date   GLUCOSE 71 09/18/2024   NA 139 09/20/2024   NA 139 09/20/2024   K 4.2 09/20/2024   K 4.2 09/20/2024   CL 104 09/18/2024   CO2 23 09/18/2024   BUN 15 09/18/2024   CREATININE 1.21 09/18/2024   EGFR 65 09/18/2024   CALCIUM  9.9 09/18/2024   PROT 6.6 08/28/2024    ALBUMIN 4.3 08/28/2024   BILITOT 0.6 08/28/2024   ALKPHOS 53 08/28/2024   AST 19 08/28/2024   ALT 28 08/28/2024   IMPRESSION AND PLAN:  Right lower quadrant pain, subacute. Question smoldering diverticulitis.  (Of note, a CT angio abdomen and  pelvis with and without contrast on 09/30/2024 showed some benign liver cysts.  No gallstones, gallbladder wall thickening, or biliary dilatation.  Pancreas was unremarkable. Spleen normal.  Adrenals and kidneys normal.  Mild thickening of the urinary bladder, likely secondary to chronic outlet obstruction.  Stomach normal, appendix normal.  No bowel wall thickening, distention, or inflammatory changes.  There is descending colonic diverticulosis.  Moderate burden of stool.).  Plan: Augmentin 875 twice daily x 14 days prescribed.  An After Visit Summary was printed and given to the patient.  FOLLOW UP: Return for 7-10d f/u abd pain.  Signed:  Gerlene Hockey, MD           10/11/2024

## 2024-10-14 NOTE — Progress Notes (Signed)
 Procedure Type: Isolated AVR  Perioperative Outcome Estimate %  Operative Mortality 1.01%  Morbidity & Mortality 6.34%  Stroke 0.984%  Renal Failure 1.13%  Reoperation 3.49%  Prolonged Ventilation 2.93%  Deep Sternal Wound Infection 0.05%  Long Hospital Stay (>14 days) 1.86%  Short Hospital Stay (<6 days) 59.7%

## 2024-10-23 ENCOUNTER — Ambulatory Visit (INDEPENDENT_AMBULATORY_CARE_PROVIDER_SITE_OTHER): Admitting: Family Medicine

## 2024-10-23 ENCOUNTER — Encounter: Payer: Self-pay | Admitting: Family Medicine

## 2024-10-23 VITALS — BP 104/60 | HR 64 | Temp 98.5°F | Ht 74.0 in | Wt 233.4 lb

## 2024-10-23 DIAGNOSIS — K5792 Diverticulitis of intestine, part unspecified, without perforation or abscess without bleeding: Secondary | ICD-10-CM | POA: Diagnosis not present

## 2024-10-23 MED ORDER — AMOXICILLIN-POT CLAVULANATE 875-125 MG PO TABS: 1.0000 | ORAL_TABLET | Freq: Two times a day (BID) | ORAL | 0 refills | Status: AC

## 2024-10-23 NOTE — Progress Notes (Signed)
 OFFICE VISIT  10/23/2024  CC:  Chief Complaint  Patient presents with   Follow-up    Abd pain; per pt, slight improvement   Patient is a 68 y.o. male who presents for 12-day follow-up abdominal pain. A/P as of last visit: Right lower quadrant pain, subacute. Question smoldering diverticulitis. (Of note, a CT angio abdomen and pelvis with and without contrast on 09/30/2024 showed some benign liver cysts.  No gallstones, gallbladder wall thickening, or biliary dilatation.  Pancreas was unremarkable. Spleen normal.  Adrenals and kidneys normal.  Mild thickening of the urinary bladder, likely secondary to chronic outlet obstruction.  Stomach normal, appendix normal.  No bowel wall thickening, distention, or inflammatory changes.  There is descending colonic diverticulosis.  Moderate burden of stool.). Plan: Augmentin 875 twice daily x 14 days prescribed  INTERIM HX: Andru feels much better.  Abdominal pain is about 75% improved.  He no longer has any bloating/gas. No new symptoms such as fever, diarrhea, blood in stool, or nausea or vomiting   Past Medical History:  Diagnosis Date   Abnormal CT scan, chest    Blebs on CT chest but NORMAL PFTs- no COPD   Aortic stenosis    severe 08/2024 (Dr. Delford).  Aortic valve very calcified and thickened, functionally bicuspid.   Ascending aorta dilatation    needs annual CTA (Dr. Delford)   Bicuspid aortic valve    Echo 2014  EF 55-60% Mild to moderate AR mild AS with mean gradient 18 mmHg and peak of 25 mmHg. ? Bicuspid valve with dilated aortic root 4.0 cm.   BPH with obstruction/lower urinary tract symptoms    Chronic abdominal pain    Atypical features/unknown etiology.  Followed by [facility] pain clinic.   COPD (chronic obstructive pulmonary disease) (HCC)    diverticulosis    DJD (degenerative joint disease)    ED (erectile dysfunction)    Elevated blood pressure reading without diagnosis of hypertension    Hx of colonic polyp 2015    Insomnia    Lumbar spondylosis    Mixed hyperlipidemia    Prediabetes    Tobacco abuse     Past Surgical History:  Procedure Laterality Date   CARDIAC CATHETERIZATION     09/2024 no signif CAD.   Carotid dopplers     03/2024 normal   COLON SURGERY  12/2010   For severe diverticulitis, 8 inches of colon was resected per patient report   COLONOSCOPY  01/13/2014   hx multiple adenomas.  Most recent 01/14/20->1 polyp->needs q5 yr colonoscopy (Dr. Leigh)   HEMORROIDECTOMY     INGUINAL HERNIA REPAIR Right 2015   Dr. Camellia Blush   RESECTION DISTAL CLAVICAL Right    RIGHT HEART CATH AND CORONARY ANGIOGRAPHY N/A 09/20/2024   Procedure: RIGHT HEART CATH AND CORONARY ANGIOGRAPHY;  Surgeon: Wonda Sharper, MD;  Location: [facility] INVASIVE CV LAB;  Service: Cardiovascular;  Laterality: N/A;   TONSILLECTOMY     TRANSTHORACIC ECHOCARDIOGRAM     03/2024, AS a bit worse (mod-to-severe), known bicuspid valve moderate AR.  08/2024 AS severe, aortic dilatation to 44 mm, grd II DD, otherwise normal.   UPPER GASTROINTESTINAL ENDOSCOPY     UPPER GI ENDOSCOPY  2015   Dr. Luis    Outpatient Medications Prior to Visit  Medication Sig Dispense Refill   acetaminophen (TYLENOL) 500 MG tablet Take 1,000 mg by mouth every 6 (six) hours as needed for mild pain (pain score 1-3).     albuterol  (VENTOLIN  HFA) 108 (90 Base)  MCG/ACT inhaler INHALE 2 PUFFS INTO THE LUNGS EVERY 4 (FOUR) HOURS AS NEEDED FOR WHEEZING OR SHORTNESS OF BREATH. PLEASE GIVE GENERIC OR THE ONE THAT INSURANCE COVERS 18 each 2   aspirin 81 MG tablet Take 81 mg by mouth daily.     atorvastatin  (LIPITOR) 40 MG tablet TAKE 1 TABLET BY MOUTH EVERY DAY 90 tablet 3   celecoxib  (CELEBREX ) 200 MG capsule Take 200 mg by mouth daily.     Cholecalciferol (VITAMIN D ) 2000 UNITS CAPS Take 2,000 Units by mouth daily.     Cyanocobalamin (VITAMIN B12 PO) Take 1 tablet by mouth daily.     icosapent  Ethyl (VASCEPA ) 1 g capsule Take 2 capsules (2 g total) by  mouth 2 (two) times daily. 120 capsule 11   metoprolol  tartrate (LOPRESSOR ) 50 MG tablet Take one tablet by mouth as directed prior to CT scan 1 tablet 0   MOVANTIK  25 MG TABS tablet TAKE 1 TABLET DAILY FOR REGULAR BM'S 90 tablet 3   oxyCODONE-acetaminophen (PERCOCET) 10-325 MG tablet Take 1 tablet by mouth every 4 (four) hours as needed for pain.     traZODone  (DESYREL ) 150 MG tablet Take      1/2 to 1 tablet       1 hour      before Bedtime for Sleep 90 tablet 3   amoxicillin-clavulanate (AUGMENTIN) 875-125 MG tablet Take 1 tablet by mouth 2 (two) times daily for 14 days. 28 tablet 0   Facility-Administered Medications Prior to Visit  Medication Dose Route Frequency Provider Last Rate Last Admin   0.9 %  sodium chloride  infusion  500 mL Intravenous Once Armbruster, Elspeth SQUIBB, MD       ipratropium-albuterol  (DUONEB) 0.5-2.5 (3) MG/3ML nebulizer solution 3 mL  3 mL Nebulization Once Jeanine Knee, NP        Allergies  Allergen Reactions   Morphine Itching    Can take hydrocodone without issues    Review of Systems As per HPI  PE:    10/23/2024    3:59 PM 10/11/2024    3:27 PM 10/11/2024   12:31 PM  Vitals with BMI  Height 6' 2 6' 2 6' 2  Weight 233 lbs 6 oz 235 lbs 234 lbs  BMI 29.95 30.16 30.03  Systolic 104 104 887  Diastolic 60 64 69  Pulse 64 71 75     Physical Exam  General: Alert and well-appearing. Affect is pleasant, lucid thought and speech. No further exam today.  LABS:  Last CBC Lab Results  Component Value Date   WBC 7.7 09/18/2024   HGB 13.6 09/20/2024   HGB 13.9 09/20/2024   HCT 40.0 09/20/2024   HCT 41.0 09/20/2024   MCV 97 09/18/2024   MCH 32.2 09/18/2024   RDW 12.5 09/18/2024   PLT 181 09/18/2024   Last metabolic panel Lab Results  Component Value Date   GLUCOSE 71 09/18/2024   NA 139 09/20/2024   NA 139 09/20/2024   K 4.2 09/20/2024   K 4.2 09/20/2024   CL 104 09/18/2024   CO2 23 09/18/2024   BUN 15 09/18/2024   CREATININE 1.21  09/18/2024   EGFR 65 09/18/2024   CALCIUM  9.9 09/18/2024   PROT 6.6 08/28/2024   ALBUMIN 4.3 08/28/2024   BILITOT 0.6 08/28/2024   ALKPHOS 53 08/28/2024   AST 19 08/28/2024   ALT 28 08/28/2024   Lab Results  Component Value Date   VITAMINB12 249 10/31/2023   IMPRESSION AND PLAN:  #1  subacute right lower quadrant pain, working diagnosis is smoldering acute diverticulitis, significantly improved since getting on Augmentin 12 days ago. I would like him to do 1 more week of Augmentin 875 twice daily. If he has not had complete resolution of his pain after that then he will let us  know.  #2 vitamin B12 deficiency. His B12 level was 249 about one year ago. He just recently picked up some B12 supplement--> he will take 1000 mcg daily.  An After Visit Summary was printed and given to the patient.  FOLLOW UP: Return if symptoms worsen or fail to improve.  Signed:  Gerlene Hockey, MD           10/23/2024

## 2024-10-30 ENCOUNTER — Encounter: Payer: Medicare Other | Admitting: Nurse Practitioner

## 2024-11-20 ENCOUNTER — Ambulatory Visit: Admitting: Family Medicine

## 2024-11-20 ENCOUNTER — Encounter: Payer: Self-pay | Admitting: Family Medicine

## 2024-11-20 VITALS — BP 114/69 | HR 72 | Temp 98.7°F | Ht 74.0 in | Wt 235.0 lb

## 2024-11-20 DIAGNOSIS — R7303 Prediabetes: Secondary | ICD-10-CM

## 2024-11-20 DIAGNOSIS — Z125 Encounter for screening for malignant neoplasm of prostate: Secondary | ICD-10-CM

## 2024-11-20 DIAGNOSIS — E7800 Pure hypercholesterolemia, unspecified: Secondary | ICD-10-CM | POA: Diagnosis not present

## 2024-11-20 DIAGNOSIS — Z0000 Encounter for general adult medical examination without abnormal findings: Secondary | ICD-10-CM

## 2024-11-20 DIAGNOSIS — Z23 Encounter for immunization: Secondary | ICD-10-CM

## 2024-11-20 LAB — COMPREHENSIVE METABOLIC PANEL WITH GFR
ALT: 29 U/L (ref 0–53)
AST: 19 U/L (ref 0–37)
Albumin: 4.3 g/dL (ref 3.5–5.2)
Alkaline Phosphatase: 59 U/L (ref 39–117)
BUN: 17 mg/dL (ref 6–23)
CO2: 27 meq/L (ref 19–32)
Calcium: 9.8 mg/dL (ref 8.4–10.5)
Chloride: 105 meq/L (ref 96–112)
Creatinine, Ser: 1.23 mg/dL (ref 0.40–1.50)
GFR: 59.77 mL/min — ABNORMAL LOW (ref >60.00)
Glucose, Bld: 119 mg/dL — ABNORMAL HIGH (ref 70–99)
Potassium: 4.7 meq/L (ref 3.5–5.1)
Sodium: 139 meq/L (ref 135–145)
Total Bilirubin: 0.7 mg/dL (ref 0.2–1.2)
Total Protein: 6.2 g/dL (ref 6.0–8.3)

## 2024-11-20 LAB — HEMOGLOBIN A1C: Hgb A1c MFr Bld: 5.8 % (ref 4.6–6.5)

## 2024-11-20 LAB — LIPID PANEL
Cholesterol: 114 mg/dL (ref 0–200)
HDL: 26.4 mg/dL — ABNORMAL LOW (ref >39.00)
LDL Cholesterol: 59 mg/dL (ref 0–99)
NonHDL: 87.38
Total CHOL/HDL Ratio: 4
Triglycerides: 141 mg/dL (ref 0.0–149.0)
VLDL: 28.2 mg/dL (ref 0.0–40.0)

## 2024-11-20 LAB — CBC WITH DIFFERENTIAL/PLATELET
Basophils Absolute: 0 K/uL (ref 0.0–0.1)
Basophils Relative: 0.5 % (ref 0.0–3.0)
Eosinophils Absolute: 0.1 K/uL (ref 0.0–0.7)
Eosinophils Relative: 1.5 % (ref 0.0–5.0)
HCT: 44.7 % (ref 39.0–52.0)
Hemoglobin: 15.3 g/dL (ref 13.0–17.0)
Lymphocytes Relative: 18.9 % (ref 12.0–46.0)
Lymphs Abs: 1.3 K/uL (ref 0.7–4.0)
MCHC: 34.2 g/dL (ref 30.0–36.0)
MCV: 93.9 fl (ref 78.0–100.0)
Monocytes Absolute: 0.6 K/uL (ref 0.1–1.0)
Monocytes Relative: 8.8 % (ref 3.0–12.0)
Neutro Abs: 4.7 K/uL (ref 1.4–7.7)
Neutrophils Relative %: 70.3 % (ref 43.0–77.0)
Platelets: 174 K/uL (ref 150.0–400.0)
RBC: 4.76 Mil/uL (ref 4.22–5.81)
RDW: 13.5 % (ref 11.5–15.5)
WBC: 6.7 K/uL (ref 4.0–10.5)

## 2024-11-20 LAB — PSA, MEDICARE: PSA: 0.85 ng/mL (ref 0.10–4.00)

## 2024-11-20 NOTE — Progress Notes (Addendum)
 "     Office Note 11/20/2024  CC:  Chief Complaint  Patient presents with   Annual Exam    Pt is fasting    HPI:  Patient is a 68 y.o. who is here for f/u HLD and prediabetes. Anthony Paul feels well. He feels like he is doing pretty good with his diet. He does not exercise.  He still smokes but he picked up nicotine  patches yesterday for the first time and is going to start this soon.    Past Medical History:  Diagnosis Date   Abnormal CT scan, chest    Blebs on CT chest but NORMAL PFTs- no COPD   Aortic stenosis    severe 08/2024 (Dr. Delford).  Aortic valve very calcified and thickened, functionally bicuspid.   Ascending aorta dilatation    needs annual CTA (Dr. Delford)   Bicuspid aortic valve    Echo 2014  EF 55-60% Mild to moderate AR mild AS with mean gradient 18 mmHg and peak of 25 mmHg. ? Bicuspid valve with dilated aortic root 4.0 cm.   BPH with obstruction/lower urinary tract symptoms    Chronic abdominal pain    Atypical features/unknown etiology.  Followed by [facility] clinic.   COPD (chronic obstructive pulmonary disease) (HCC)    diverticulosis    hx diverticulitis and partial colon resection   DJD (degenerative joint disease)    ED (erectile dysfunction)    Elevated blood pressure reading without diagnosis of hypertension    Hx of colonic polyp 2015   Insomnia    Lumbar spondylosis    Mixed hyperlipidemia    Prediabetes    Tobacco abuse    Vitamin B12 deficiency     Past Surgical History:  Procedure Laterality Date   CARDIAC CATHETERIZATION     09/2024 no signif CAD.   Carotid dopplers     03/2024 normal   COLON SURGERY  12/2010   For severe diverticulitis, 8 inches of colon was resected per patient report   COLONOSCOPY  01/13/2014   hx multiple adenomas.  Most recent 01/14/20->1 polyp->needs q5 yr colonoscopy (Dr. Leigh)   HEMORROIDECTOMY     INGUINAL HERNIA REPAIR Right 2015   Dr. Camellia Blush   RESECTION DISTAL CLAVICAL Right    RIGHT HEART  CATH AND CORONARY ANGIOGRAPHY N/A 09/20/2024   Procedure: RIGHT HEART CATH AND CORONARY ANGIOGRAPHY;  Surgeon: Wonda Sharper, MD;  Location: [facility] INVASIVE CV LAB;  Service: Cardiovascular;  Laterality: N/A;   TONSILLECTOMY     TRANSTHORACIC ECHOCARDIOGRAM     03/2024, AS a bit worse (mod-to-severe), known bicuspid valve moderate AR.  08/2024 AS severe, aortic dilatation to 44 mm, grd II DD, otherwise normal.   UPPER GASTROINTESTINAL ENDOSCOPY     UPPER GI ENDOSCOPY  2015   Dr. Luis    Family History  Problem Relation Age of Onset   Heart disease Father    Benign prostatic hyperplasia Father    Diabetes Mother    Kidney disease Sister    Cancer Sister        in Spine   Colon cancer Neg Hx    Esophageal cancer Neg Hx    Stomach cancer Neg Hx    Rectal cancer Neg Hx     Social History   Socioeconomic History   Marital status: Married    Spouse name: Not on file   Number of children: 3   Years of education: Not on file   Highest education level: Not on file  Occupational History   Occupation: retired Control And Instrumentation Engineer: [employer]  Tobacco Use   Smoking status: Every Day    Current packs/day: 1.50    Average packs/day: 1.5 packs/day for 52.9 years (79.4 ttl pk-yrs)    Types: Cigarettes    Start date: 1973   Smokeless tobacco: Former    Types: Chew    Quit date: 12/19/2006   Tobacco comments:    Started smoking at age 62, current 1/2 ppd  Vaping Use   Vaping status: Never Used  Substance and Sexual Activity   Alcohol use: Yes    Alcohol/week: 5.0 standard drinks of alcohol    Types: 5 Standard drinks or equivalent per week    Comment: 1-2 drinks per day   Drug use: No   Sexual activity: Yes    Partners: Female  Other Topics Concern   Not on file  Social History Narrative   Married, 2 daughters local and 1 in [place].   Lives in [place] many years.   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   Likes to dispensing optician.   50+ pack-yr cig hx-->current as of 02/2024.   Social Drivers of Corporate Investment Banker Strain: Not on file  Food Insecurity: Not on file  Transportation Needs: Not on file  Physical Activity: Not on file  Stress: Not on file  Social Connections: Not on file  Intimate Partner Violence: Not on file    Outpatient Medications Prior to Visit  Medication Sig Dispense Refill   acetaminophen  (TYLENOL ) 500 MG tablet Take 1,000 mg by mouth every 6 (six) hours as needed for mild pain (pain score 1-3).     albuterol  (VENTOLIN  HFA) 108 (90 Base) MCG/ACT inhaler INHALE 2 PUFFS INTO THE LUNGS EVERY 4 (FOUR) HOURS AS NEEDED FOR WHEEZING OR SHORTNESS OF BREATH. PLEASE GIVE GENERIC OR THE ONE THAT INSURANCE COVERS 18 each 2   aspirin  81 MG tablet Take 81 mg by mouth daily.     atorvastatin  (LIPITOR) 40 MG tablet TAKE 1 TABLET BY MOUTH EVERY DAY 90 tablet 3   celecoxib  (CELEBREX ) 200 MG capsule Take 200 mg by mouth daily.     Cholecalciferol (VITAMIN D ) 2000 UNITS CAPS Take 2,000 Units by mouth daily.     Cyanocobalamin  (VITAMIN B12 PO) Take 1 tablet by mouth daily.     icosapent  Ethyl (VASCEPA ) 1 g capsule Take 2 capsules (2 g total) by mouth 2 (two) times daily. 120 capsule 11   metoprolol  tartrate (LOPRESSOR ) 50 MG tablet Take one tablet by mouth as directed prior to CT scan 1 tablet 0   MOVANTIK  25 MG TABS tablet TAKE 1 TABLET DAILY FOR REGULAR BM'S 90 tablet 3   oxyCODONE-acetaminophen  (PERCOCET) 10-325 MG tablet Take 1 tablet by mouth every 4 (four) hours as needed for pain.     traZODone  (DESYREL ) 150 MG tablet Take      1/2 to 1 tablet       1 hour      before Bedtime for Sleep 90 tablet 3   Facility-Administered Medications Prior to Visit  Medication Dose Route Frequency Provider Last Rate Last Admin   0.9 %  sodium chloride  infusion  500 mL Intravenous Once Armbruster, Elspeth SQUIBB, MD       ipratropium-albuterol  (DUONEB) 0.5-2.5 (3) MG/3ML nebulizer solution 3 mL  3 mL Nebulization  Once Jeanine Knee, NP  Allergies  Allergen Reactions   Morphine Itching    Can take hydrocodone without issues    Review of Systems  Constitutional:  Negative for appetite change, chills, fatigue and fever.  HENT:  Negative for congestion, dental problem, ear pain and sore throat.   Eyes:  Negative for discharge, redness and visual disturbance.  Respiratory:  Negative for cough, chest tightness, shortness of breath and wheezing.   Cardiovascular:  Negative for chest pain, palpitations and leg swelling.  Gastrointestinal:  Negative for abdominal pain, blood in stool, diarrhea, nausea and vomiting.  Genitourinary:  Negative for difficulty urinating, dysuria, flank pain, frequency, hematuria and urgency.  Musculoskeletal:  Negative for arthralgias, back pain, joint swelling, myalgias and neck stiffness.  Skin:  Negative for pallor and rash.  Neurological:  Negative for dizziness, speech difficulty, weakness and headaches.  Hematological:  Negative for adenopathy. Does not bruise/bleed easily.  Psychiatric/Behavioral:  Negative for confusion and sleep disturbance. The patient is not nervous/anxious.     PE;    11/20/2024    1:20 PM 10/23/2024    3:59 PM 10/11/2024    3:27 PM  Vitals with BMI  Height 6' 2 6' 2 6' 2  Weight 235 lbs 233 lbs 6 oz 235 lbs  BMI 30.16 29.95 30.16  Systolic  104 104  Diastolic  60 64  Pulse  64 71   Gen: Alert, well appearing.  Patient is oriented to person, place, time, and situation. AFFECT: pleasant, lucid thought and speech. ENT: Ears: EACs clear, normal epithelium.  TMs with good light reflex and landmarks bilaterally.  Eyes: no injection, icteris, swelling, or exudate.  EOMI, PERRLA. Nose: no drainage or turbinate edema/swelling.  No injection or focal lesion.  Mouth: lips without lesion/swelling.  Oral mucosa pink and moist.  Dentition intact and without obvious caries or gingival swelling.  Oropharynx without erythema, exudate, or  swelling.  Neck: supple/nontender.  No LAD, mass, or TM.  Carotid pulses 2+ bilaterally, without bruits. CV: RRR, 2 out of 6 systolic murmur.  S1 and S2 are obscured.  No diastolic murmur.  No r/g.   LUNGS: CTA bilat, nonlabored resps, good aeration in all lung fields. ABD: soft, NT, ND, BS normal.  No hepatospenomegaly or mass.  No bruits. EXT: no clubbing, cyanosis, or edema.  Musculoskeletal: no joint swelling, erythema, warmth, or tenderness.  ROM of all joints intact. Skin - no sores or suspicious lesions or rashes or color changes  Pertinent labs:  Lab Results  Component Value Date   TSH 1.60 10/31/2023   Lab Results  Component Value Date   WBC 7.7 09/18/2024   HGB 13.6 09/20/2024   HGB 13.9 09/20/2024   HCT 40.0 09/20/2024   HCT 41.0 09/20/2024   MCV 97 09/18/2024   PLT 181 09/18/2024   Lab Results  Component Value Date   CREATININE 1.21 09/18/2024   BUN 15 09/18/2024   NA 139 09/20/2024   NA 139 09/20/2024   K 4.2 09/20/2024   K 4.2 09/20/2024   CL 104 09/18/2024   CO2 23 09/18/2024   Lab Results  Component Value Date   ALT 28 08/28/2024   AST 19 08/28/2024   ALKPHOS 53 08/28/2024   BILITOT 0.6 08/28/2024   Lab Results  Component Value Date   CHOL 128 08/28/2024   Lab Results  Component Value Date   HDL 30.30 (L) 08/28/2024   Lab Results  Component Value Date   LDLCALC 61 08/28/2024   Lab Results  Component Value Date   TRIG 184.0 (H) 08/28/2024   Lab Results  Component Value Date   CHOLHDL 4 08/28/2024   Lab Results  Component Value Date   PSA 0.58 10/31/2023   PSA 0.47 09/29/2022   PSA 0.24 09/29/2021   Lab Results  Component Value Date   HGBA1C 5.6 08/26/2024   HGBA1C 5.6 08/26/2024   HGBA1C 5.6 (A) 08/26/2024   HGBA1C 5.6 08/26/2024   ASSESSMENT AND PLAN:   #1 Mixed hyperlipidemia.  Doing well on vascepa  2g bid and atorva 40 every day. Lipid and hepatic panel today.  #2 Prediabetes. Max A1c 6.2% nov 2024. Fasting gluc and  Hba1c today. TLC.  #3 Preventative health: Vaccines: flu->given today.  Shingrix-->he'll consider-->rx at pharm. Labs: fasting HP + Hba1c and PSA (see labs from sept/oct this year) Prostate ca screening: PSA today Colon ca screening:  hx multiple adenomas.  Most recent 01/14/20->1 polyp->needs q5 yr colonoscopy (Dr. Leigh) Lung cancer screening: He gets imaged annually for history of fusiform dilatation of the ascending thoracic aorta, most recent was 09/30/24, no pulm lesion.  Followed by CV surgeon Dr. Kerrin.  An After Visit Summary was printed and given to the patient.  FOLLOW UP:  Return in about 6 months (around 05/21/2025) for routine chronic illness f/u.  Signed:  Gerlene Hockey, MD           11/20/2024  "

## 2024-11-20 NOTE — Patient Instructions (Signed)
   It was very nice to see you today!   Please contact [facility] to schedule your repeat colonoscopy.   PLEASE NOTE:  If labs were collected or images ordered, we will inform you of  results once we have received them and reviewed. We will contact you either by echart message, or telephone call.  Please give ample time to the testing facility, and our office to run,  receive and review results. Please do not call inquiring of results, even if you can see them in your chart. We will contact you as soon as we are able. If it has been over 1 week since the test was completed, and you have not yet heard from us , then please call us .     - echart message- for normal results that have been seen by the patient already.   - telephone call: abnormal results or if patient has not viewed results in their echart.   If a referral to a specialist was entered for you, please call us  in 2 weeks if you have not heard from the specialist office to schedule.    Please try these tips to maintain a healthy lifestyle:  Eat most of your calories during the day when you are active. Eliminate processed foods including packaged sweets (pies, cakes, cookies), reduce intake of potatoes, white bread, white pasta, and white rice. Look for whole grain options, oat flour or almond flour.  Each meal should contain half fruits/vegetables, one quarter protein, and one quarter carbs (no bigger than a computer mouse).  Cut down on sweet beverages. This includes juice, soda, and sweet tea. Also watch fruit intake, though this is a healthier sweet option, it still contains natural sugar! Limit to 3 servings daily.  Drink at least 1 glass of water  with each meal and aim for at least 8 glasses per day  Exercise at least 150 minutes every week.

## 2024-11-21 ENCOUNTER — Ambulatory Visit: Payer: Self-pay | Admitting: Family Medicine

## 2024-12-19 ENCOUNTER — Encounter: Payer: Self-pay | Admitting: Family Medicine

## 2024-12-19 NOTE — Addendum Note (Signed)
 Addended by: CANDISE ALEENE DEL on: 12/19/2024 07:33 PM   Modules accepted: Level of Service

## 2024-12-27 ENCOUNTER — Ambulatory Visit
Attending: Thoracic Surgery (Cardiothoracic Vascular Surgery) | Admitting: Thoracic Surgery (Cardiothoracic Vascular Surgery)

## 2024-12-27 ENCOUNTER — Other Ambulatory Visit: Payer: Self-pay | Admitting: *Deleted

## 2024-12-27 ENCOUNTER — Encounter: Payer: Self-pay | Admitting: Thoracic Surgery (Cardiothoracic Vascular Surgery)

## 2024-12-27 ENCOUNTER — Encounter: Payer: Self-pay | Admitting: *Deleted

## 2024-12-27 VITALS — BP 115/75 | HR 83 | Resp 20 | Ht 74.0 in | Wt 235.7 lb

## 2024-12-27 DIAGNOSIS — I350 Nonrheumatic aortic (valve) stenosis: Secondary | ICD-10-CM | POA: Insufficient documentation

## 2024-12-27 NOTE — H&P (View-Only) (Signed)
 "     [address]       [place] [address]             [phone]        [name] [facility] Medical Record #[id number] Date of Birth: 1956/10/21  Referring: Delford Maude BROCKS, MD Primary Care: Candise Aleene DEL, MD Primary Cardiologist:Peter Delford, MD  Chief Complaint:    Chief Complaint  Patient presents with   Aortic Stenosis    Further discuss surgery    History of Present Illness:     Anthony Paul is a 69 y.o. male who presents for surgical evaluation of severe aortic stenosis.  He was originally evaluated for TAVR, but was found to have a left main height of 8.4 mm.  In regards to his symptoms, he does admit to some shortness of breath with exertion but denies any chest pain or lightheadedness.  He sees a education officer, community twice a year.  No major changes since his last appointment.  He continues to have some dizziness, and shortness of breath, but denies any chest pain.   Past Medical History:  Diagnosis Date   Abnormal CT scan, chest    Blebs on CT chest but NORMAL PFTs- no COPD   Aortic stenosis    severe 2025--->pt to get tissue valve replacement 2026 (Dr. Kerrin)   Ascending aorta dilatation    needs annual CTA (Dr. Delford)   Bicuspid aortic valve    Echo 2014  EF 55-60% Mild to moderate AR mild AS with mean gradient 18 mmHg and peak of 25 mmHg. ? Bicuspid valve with dilated aortic root 4.0 cm.   BPH with obstruction/lower urinary tract symptoms    Chronic abdominal pain    Atypical features/unknown etiology.  Followed by [facility] pain clinic.   COPD (chronic obstructive pulmonary disease) (HCC)    diverticulosis    hx diverticulitis and partial colon resection   DJD (degenerative joint disease)    ED (erectile dysfunction)    Elevated blood pressure reading without diagnosis of hypertension    Hx of colonic polyp 2015   Insomnia    Lumbar spondylosis    Mixed hyperlipidemia    Prediabetes    Tobacco abuse    Vitamin B12 deficiency      Past Surgical History:  Procedure Laterality Date   CARDIAC CATHETERIZATION     09/2024 no signif CAD.   Carotid dopplers     03/2024 normal   COLON SURGERY  12/2010   For severe diverticulitis, 8 inches of colon was resected per patient report   COLONOSCOPY  01/13/2014   hx multiple adenomas.  Most recent 01/14/20->1 polyp->needs q5 yr colonoscopy (Dr. Leigh)   HEMORROIDECTOMY     INGUINAL HERNIA REPAIR Right 2015   Dr. Camellia Blush   RESECTION DISTAL CLAVICAL Right    RIGHT HEART CATH AND CORONARY ANGIOGRAPHY N/A 09/20/2024   Procedure: RIGHT HEART CATH AND CORONARY ANGIOGRAPHY;  Surgeon: Wonda Sharper, MD;  Location: [facility] INVASIVE CV LAB;  Service: Cardiovascular;  Laterality: N/A;   TONSILLECTOMY     TRANSTHORACIC ECHOCARDIOGRAM     03/2024, AS a bit worse (mod-to-severe), known bicuspid valve moderate AR.  08/2024 AS severe, aortic dilatation to 44 mm, grd II DD, otherwise normal.   UPPER GASTROINTESTINAL ENDOSCOPY     UPPER GI ENDOSCOPY  2015   Dr. Luis    Social History:  Social History   Tobacco Use  Smoking Status Every Day   Current  packs/day: 1.50   Average packs/day: 1.5 packs/day for 53.0 years (79.5 ttl pk-yrs)   Types: Cigarettes   Start date: 1973  Smokeless Tobacco Former   Types: Chew   Quit date: 12/19/2006  Tobacco Comments   Started smoking at age 10, current 1/2 ppd    Social History   Substance and Sexual Activity  Alcohol Use Yes   Alcohol/week: 5.0 standard drinks of alcohol   Types: 5 Standard drinks or equivalent per week   Comment: 1-2 drinks per day     Allergies  Allergen Reactions   Morphine Itching    Can take hydrocodone without issues     Current Outpatient Medications  Medication Sig Dispense Refill   acetaminophen  (TYLENOL ) 500 MG tablet Take 1,000 mg by mouth every 6 (six) hours as needed for mild pain (pain score 1-3).     albuterol  (VENTOLIN  HFA) 108 (90 Base) MCG/ACT inhaler INHALE 2 PUFFS INTO THE LUNGS EVERY 4  (FOUR) HOURS AS NEEDED FOR WHEEZING OR SHORTNESS OF BREATH. PLEASE GIVE GENERIC OR THE ONE THAT INSURANCE COVERS 18 each 2   aspirin  81 MG tablet Take 81 mg by mouth daily.     atorvastatin  (LIPITOR) 40 MG tablet TAKE 1 TABLET BY MOUTH EVERY DAY 90 tablet 3   celecoxib  (CELEBREX ) 200 MG capsule Take 200 mg by mouth daily.     Cholecalciferol (VITAMIN D ) 2000 UNITS CAPS Take 2,000 Units by mouth daily.     Cyanocobalamin  (VITAMIN B12 PO) Take 1 tablet by mouth daily.     icosapent  Ethyl (VASCEPA ) 1 g capsule Take 2 capsules (2 g total) by mouth 2 (two) times daily. 120 capsule 11   metoprolol  tartrate (LOPRESSOR ) 50 MG tablet Take one tablet by mouth as directed prior to CT scan 1 tablet 0   MOVANTIK  25 MG TABS tablet TAKE 1 TABLET DAILY FOR REGULAR BM'S 90 tablet 3   oxyCODONE-acetaminophen  (PERCOCET) 10-325 MG tablet Take 1 tablet by mouth every 4 (four) hours as needed for pain.     traZODone  (DESYREL ) 150 MG tablet Take      1/2 to 1 tablet       1 hour      before Bedtime for Sleep 90 tablet 3   Current Facility-Administered Medications  Medication Dose Route Frequency Provider Last Rate Last Admin   0.9 %  sodium chloride  infusion  500 mL Intravenous Once Armbruster, Elspeth SQUIBB, MD       ipratropium-albuterol  (DUONEB) 0.5-2.5 (3) MG/3ML nebulizer solution 3 mL  3 mL Nebulization Once Jeanine Knee, NP        (Not in a hospital admission)   Family History  Problem Relation Age of Onset   Heart disease Father    Benign prostatic hyperplasia Father    Diabetes Mother    Kidney disease Sister    Cancer Sister        in Spine   Colon cancer Neg Hx    Esophageal cancer Neg Hx    Stomach cancer Neg Hx    Rectal cancer Neg Hx      Review of Systems:   Review of Systems  Constitutional:  Negative for malaise/fatigue.  Respiratory:  Positive for shortness of breath. Negative for cough.   Cardiovascular:  Negative for chest pain and leg swelling.  Neurological: Negative.        Physical Exam: There were no vitals taken for this visit. Physical Exam Constitutional:      General: He is not in  acute distress.    Appearance: He is not ill-appearing.  HENT:     Head: Normocephalic and atraumatic.  Eyes:     Extraocular Movements: Extraocular movements intact.  Cardiovascular:     Rate and Rhythm: Normal rate.  Pulmonary:     Effort: Pulmonary effort is normal. No respiratory distress.  Abdominal:     General: Abdomen is flat. There is no distension.  Musculoskeletal:        General: Normal range of motion.     Cervical back: Normal range of motion.  Skin:    General: Skin is warm and dry.  Neurological:     General: No focal deficit present.     Mental Status: He is alert and oriented to person, place, and time.       Diagnostic Studies & Laboratory data: Cardiac Studies & Procedures   ______________________________________________________________________________________________ CARDIAC CATHETERIZATION  CARDIAC CATHETERIZATION 09/20/2024  Conclusion 1.  Patent coronary arteries with minimal irregularities, no significant stenoses 2.  Normal right heart catheterization: RA mean 8 mmHg RV 28/8 mmHg PA 25/10 mean 17 mmHg Pulmonary wedge pressure A-wave 13, V wave 10, mean 9 mmHg Cardiac output 4.5 L/min, cardiac index 1.92 3.  Calcified bicuspid aortic valve with restricted leaflet mobility  Recommendations: Cardiac surgical consultation for aortic valve replacement.  Patient with no significant CAD.  Suspect the low cardiac index is spurious as the patient has no clinical evidence of low output.  Findings Coronary Findings Diagnostic  Dominance: Right  Left Main Vessel is angiographically normal. The left main is normal.  It divides into the LAD, intermediate branch, and left circumflex.  Left Anterior Descending The vessel exhibits minimal luminal irregularities. The LAD is patent to the apex there is a mild intramyocardial bridge in  the midportion.  The vessel has no stenosis.  The diagonals are patent.  Ramus Intermedius Vessel is angiographically normal. Widely patent vessel with a focal bridging segment in the midportion  Left Circumflex The vessel exhibits minimal luminal irregularities.  Right Coronary Artery The vessel exhibits minimal luminal irregularities. The RCA is dominant.  The vessel is widely patent throughout with no significant stenosis.  The PDA and PLA branches are patent.  Intervention  No interventions have been documented.   STRESS TESTS  EXERCISE TOLERANCE TEST (ETT) 10/10/2016  Interpretation Summary  Blood pressure demonstrated a normal response to exercise.  There was no ST segment deviation noted during stress.  No T wave inversion was noted during stress.  Stress test is negative for ischemia   ECHOCARDIOGRAM  ECHOCARDIOGRAM COMPLETE 08/27/2024  Narrative ECHOCARDIOGRAM REPORT    Patient Name:   DARRILL VREELAND Date of Exam: 08/27/2024 Medical Rec #:  [id number]      Height:       73.7 in Accession #:    [id number]     Weight:       237.6 lb Date of Birth:  14-Jan-1956       BSA:          2.332 m Patient Age:    68 years       BP:           101/66 mmHg Patient Gender: M              HR:           72 bpm. Exam Location:  [address]  Procedure: 2D Echo, Cardiac Doppler and Color Doppler (Both Spectral and Color Flow Doppler were utilized during procedure).  Indications:  I35.0 Aortic Stenosis  History:        Patient has prior history of Echocardiogram examinations, most recent 03/19/2024. COPD; Risk Factors:Dyslipidemia and Current Smoker.  Sonographer:    Carl Rodgers-Jones RDCS Referring Phys: [id number] PETER C NISHAN  IMPRESSIONS   1. Left ventricular ejection fraction, by estimation, is 55 to 60%. The left ventricle has normal function. The left ventricle has no regional wall motion abnormalities. There is moderate concentric left ventricular hypertrophy.  Left ventricular diastolic parameters are consistent with Grade II diastolic dysfunction (pseudonormalization). 2. Right ventricular systolic function is normal. The right ventricular size is normal. 3. The mitral valve is normal in structure. No evidence of mitral valve regurgitation. No evidence of mitral stenosis. 4. The aortic valve is calcified. There is severe calcifcation of the aortic valve. There is severe thickening of the aortic valve. Aortic valve regurgitation is moderate. Severe aortic valve stenosis. Aortic regurgitation PHT measures 361 msec. Aortic valve area, by VTI measures 1.19 cm. Aortic valve mean gradient measures 42.0 mmHg. Aortic valve Vmax measures 3.86 m/s. 5. Aortic dilatation noted. There is moderate dilatation of the ascending aorta, measuring 44 mm. 6. The inferior vena cava is normal in size with greater than 50% respiratory variability, suggesting right atrial pressure of 3 mmHg.  FINDINGS Left Ventricle: Left ventricular ejection fraction, by estimation, is 55 to 60%. The left ventricle has normal function. The left ventricle has no regional wall motion abnormalities. The left ventricular internal cavity size was normal in size. There is moderate concentric left ventricular hypertrophy. Left ventricular diastolic parameters are consistent with Grade II diastolic dysfunction (pseudonormalization). Indeterminate filling pressures.  Right Ventricle: The right ventricular size is normal. No increase in right ventricular wall thickness. Right ventricular systolic function is normal.  Left Atrium: Left atrial size was normal in size.  Right Atrium: Right atrial size was normal in size.  Pericardium: There is no evidence of pericardial effusion.  Mitral Valve: The mitral valve is normal in structure. No evidence of mitral valve regurgitation. No evidence of mitral valve stenosis.  Tricuspid Valve: The tricuspid valve is normal in structure. Tricuspid valve  regurgitation is not demonstrated. No evidence of tricuspid stenosis.  The aortic valve is calcified. There is severe calcifcation of the aortic valve. There is severe thickening of the aortic valve. Aortic valve regurgitation is moderate. Severe aortic stenosis is present. .  Pulmonic Valve: The pulmonic valve was normal in structure. Pulmonic valve regurgitation is not visualized. No evidence of pulmonic stenosis.  Aorta: Aortic dilatation noted. There is moderate dilatation of the ascending aorta, measuring 44 mm.  Venous: The inferior vena cava is normal in size with greater than 50% respiratory variability, suggesting right atrial pressure of 3 mmHg.  IAS/Shunts: No atrial level shunt detected by color flow Doppler.   LEFT VENTRICLE PLAX 2D LVIDd:         4.50 cm   Diastology LVIDs:         2.70 cm   LV e' medial:    7.29 cm/s LV PW:         1.40 cm   LV E/e' medial:  12.7 LV IVS:        1.40 cm   LV e' lateral:   6.42 cm/s LVOT diam:     2.30 cm   LV E/e' lateral: 14.4 LV SV:         100 LV SV Index:   43 LVOT Area:     4.15 cm  RIGHT VENTRICLE             IVC RV Basal diam:  4.20 cm     IVC diam: 1.10 cm RV S prime:     19.00 cm/s TAPSE (M-mode): 2.4 cm  LEFT ATRIUM             Index        RIGHT ATRIUM           Index LA diam:        4.50 cm 1.93 cm/m   RA Area:     13.40 cm LA Vol (A2C):   67.8 ml 29.07 ml/m  RA Volume:   36.00 ml  15.44 ml/m LA Vol (A4C):   55.9 ml 23.97 ml/m LA Biplane Vol: 64.7 ml 27.74 ml/m AORTIC VALVE AV Area (Vmax):    1.20 cm AV Area (Vmean):   1.17 cm AV Area (VTI):     1.19 cm AV Vmax:           386.00 cm/s AV Vmean:          271.600 cm/s AV VTI:            0.844 m AV Peak Grad:      59.6 mmHg AV Mean Grad:      42.0 mmHg LVOT Vmax:         111.67 cm/s LVOT Vmean:        76.567 cm/s LVOT VTI:          0.241 m LVOT/AV VTI ratio: 0.29 AI PHT:            361 msec  AORTA Ao Root diam: 3.80 cm Ao Asc diam:  4.40  cm  MITRAL VALVE MV Area (PHT): 3.84 cm    SHUNTS MV Decel Time: 198 msec    Systemic VTI:  0.24 m MV E velocity: 92.50 cm/s  Systemic Diam: 2.30 cm MV A velocity: 81.75 cm/s MV E/A ratio:  1.13  Annabella Scarce MD Electronically signed by Annabella Scarce MD Signature Date/Time: 08/27/2024/7:29:26 PM    Final      CT SCANS  CT CORONARY MORPH W/CTA COR W/SCORE 09/30/2024  Addendum 10/02/2024 11:44 PM ADDENDUM REPORT: 10/02/2024 23:41  EXAM: OVER-READ INTERPRETATION  CT CHEST  The following report is an over-read performed by radiologist Dr. Franky Leff [facility] on 10/02/2024. This over-read does not include interpretation of cardiac or coronary anatomy or pathology. The coronary CTA interpretation by the cardiologist is attached.  COMPARISON:  03/01/2023  FINDINGS: Aneurysmal dilatation of the ascending thoracic aorta measuring up to 4.2 cm. Insert calcified aortic valve. No adenopathy. No confluent airspace opacities or effusions. No acute findings in the upper abdomen. Chest wall soft tissues are unremarkable. No acute bony abnormality.  IMPRESSION: Ascending thoracic aortic aneurysm.  No acute extra cardiac abnormality.   Electronically Signed By: Franky Crease M.D. On: 10/02/2024 23:41  Narrative CLINICAL DATA:  Severe Aortic Stenosis.  EXAM: Cardiac TAVR CT  TECHNIQUE: A non-contrast, gated CT scan was obtained with axial slices of 2.5 mm through the heart for aortic valve scoring. A 100 kV retrospective, gated, contrast cardiac scan was obtained. Gantry rotation speed was 230 msec and collimation was 0.63 mm. Nitroglycerin  was not given. The 3D dataset was reconstructed in systole with motion correction. The 3D data set was reconstructed in 5% intervals of the 0-95% of the R-R cycle. Systolic and diastolic phases were analyzed on a dedicated workstation using MPR, MIP, and VRT modes.  The patient received 100 cc of  contrast.  FINDINGS: Image quality: Excellent.  Noise artifact is: Limited.  Valve Morphology: Bicuspid aortic valve with fusion of the RCC/LCC with raphe. Bulky calcification of the raphe. Restricted leaflet motion in systole.  Aortic Valve Calcium  score: 2296  Aortic annular dimension:  Phase assessed: 25%  Annular area: 553 mm2  Annular perimeter: 85.1 mm  Max diameter: 29.6 mm  Min diameter: 24.2 mm  Annular and subannular calcification: None.  Membranous septum length: 11.8 mm  Optimal coplanar projection: RAO 16 CAU 10  Coronary Artery Height above Annulus:  Left Main: 8.4 mm  Right Coronary: 17.8 mm  Sinus of Valsalva Measurements:  Non-coronary: 36 mm  Right-coronary: 37 mm  Left-coronary: 38 mm  Max diameter: 40 mm cusp to cusp  Sinus of Valsalva Height:  Non-coronary: 24.2 mm  Right-coronary: 22.4 mm  Left-coronary: 18.8 mm  Sinotubular Junction: 38 mm  Ascending Thoracic Aorta: 44 mm (double oblique).  Coronary Arteries: Normal coronary origin. Right dominance. The study was performed without use of NTG and is insufficient for plaque evaluation. Please refer to recent cardiac catheterization for coronary assessment.  Cardiac Morphology:  Right Atrium: Right atrial size is within normal limits.  Right Ventricle: The right ventricular cavity is within normal limits.  Left Atrium: Left atrial size is normal in size with no left atrial appendage filling defect.  Left Ventricle: The ventricular cavity size is within normal limits.  Pulmonary arteries: Normal in size without proximal filling defect.  Pulmonary veins: Normal pulmonary venous drainage.  Pericardium: Normal thickness with no significant effusion or calcium  present.  Mitral Valve: The mitral valve is normal structure without significant calcification.  Extra-cardiac findings: See attached radiology report for non-cardiac structures.  IMPRESSION: 1. Bicuspid  aortic valve with fusion of the RCC/LCC with raphe. Bulky calcification of the raphe noted.  2. Annular measurements support a 29 mm S3 or 34 mm Evolut Pro.  3. No significant annular or subannular calcifications.  4. Shallow left main height (8.4 mm). Structural heart team discussion recommended.  5. Optimal Fluoroscopic Angle for Delivery: RAO 16 CAU 10  6. Moderate dilated of the ascending aorta up to 44 mm.  Darryle T. Barbaraann, MD  Electronically Signed: By: Darryle Barbaraann M.D. On: 10/02/2024 20:08   CT SCANS  CT CORONARY MORPH W/CTA COR W/SCORE 12/11/2018  Addendum 12/11/2018  2:02 PM ADDENDUM REPORT: 12/11/2018 13:59  CLINICAL DATA:  Chest pain  EXAM: Cardiac CTA  MEDICATIONS: Sub lingual nitro. 4 mg and lopressor  5mg   TECHNIQUE: The patient was scanned on a [employer] 192 scanner. Gantry rotation speed was 250 msecs. Collimation was. 6 mm . A 120 kV prospective scan was triggered in the ascending thoracic aorta at 140 HU's with full mA between 30-70% of the R-R interval . Average HR during the scan was 54 bpm. The 3D data set was interpreted on a dedicated work station using MPR, MIP and VRT modes. A total of 80 cc of contrast was used.  FINDINGS: Non-cardiac: See separate report from [facility]. No significant findings on limited lung and soft tissue windows.  Aortic valve was tri leaflet with severely calcified commissure between right and left cusps making motion functionally bicuspid  Ascending aortic root: 3.8 cm  STJ: 3.0 cm  Average Sinus 3.2 cm  Arch: 2.9 cm with normal branch vessels  Descending thoracic aorta: 2.2 cm  Calcium  score: Not done  Coronary Arteries: Right dominant with no anomalies  LM: Normal  LAD: Normal  IM: Normla  D1: Normal  Circumflex: Normal  OM1: Normal  OM2: Normal  RCA: Normal  PDA: Normal  PLA: Normal  IMPRESSION: 1. Functionally bicuspid AV with partial fusion of left and  right cusps. Recommend following degree of AS with echo  2.  Upper normal aortic root size 3.8 cm with normal arch vessels  3.  Normal right dominant coronary arteries  Maude Emmer   Electronically Signed By: Maude Emmer M.D. On: 12/11/2018 13:59  Narrative EXAM: OVER-READ INTERPRETATION  CT CHEST  The following report is an over-read performed by radiologist Dr. Franky Crease of [facility] on 12/11/2018. This over-read does not include interpretation of cardiac or coronary anatomy or pathology. The coronary CTA interpretation by the cardiologist is attached.  COMPARISON:  05/28/2012  FINDINGS: Vascular: Ascending aorta has a maximum diameter of 3.8 cm. Heart is normal size.  Mediastinum/Nodes: No adenopathy in the lower mediastinum or hila.  Lungs/Pleura: Dependent atelectasis.  No effusions.  Upper Abdomen: Imaging into the upper abdomen shows no acute findings.  Musculoskeletal: Chest wall soft tissues are unremarkable. No acute bony abnormality.  IMPRESSION: Ascending aorta upper limits normal in diameter at 3.8 cm. No acute extra cardiac abnormality.  Electronically Signed: By: Franky Crease M.D. On: 12/11/2018 11:26   CARDIAC MRI  MR CARDIAC MORPHOLOGY W WO CONTRAST 07/03/2012  Narrative Cardiac MRI/Aortic MRA  Indication: ? Bicuspid Aortic Valve R/O Aneurysm  Protocol:  The patient was scanned on a 1.5 Tesla GE magnet. Functional imaging was done using Fiesta sequences.  MRA and delayed enhancement images were done after 25 cc of Mulithance. Aortic root measurements were made axially using IIR black blood sequences as well as in LAO oblique on MRA.  Quantitative EF and regurgitant fraction were calculated on a dedicated [employer] work station  Findings:  All 4 cardiac chambers were normal in size and function. Quantitative EF was 60% (EDV 170cc, ESV 68cc, SV 102cc) There was no hyperenhancement or scar tissue  The  Aortic valve was functional type one bicuspid with fusion of the right and left cusps.  Valve opening was minimally restricted and mean gradient was only 16 mmHg by recent echo.  Regurgitant fraction was estimated at 25% suggesting mild AR although Fiesta sequences suggested more moderate visual AR.  AR was central from poor leaflet coaptation  Aortic MRA:  There was mild ascending aortic root dilatation at 3.9 cm at the level of the RPA.  The sinus of valsalva, arch and descending thoracic aorta was normal.  Descending aorta measures 2.3 cm.  The great vessels arose normally from the arch. There was no evendence of coarctation.  Impression:  1)    Normal LV size and function EF 60%  2)    No hyperenhancement 3)    Functionally type one bicuspid AV with mild AS and mild AR by regurgitant fraction although qualitatively looked more moderate 4)    Mild dilatation of ascending aortic root 3.9 cm with no coarctation  Cc: Dr Ethan Schilling  Original Report Authenticated By: [email or web address]   ______________________________________________________________________________________________     EKG: Sinus I have independently reviewed the above radiologic studies and discussed with the patient   Recent Lab Findings: Lab Results  Component Value Date   WBC 6.7 11/20/2024   HGB 15.3 11/20/2024   HCT 44.7 11/20/2024   PLT 174.0 11/20/2024   GLUCOSE 119 (H) 11/20/2024   CHOL 114 11/20/2024   TRIG 141.0 11/20/2024   HDL  26.40 (L) 11/20/2024   LDLCALC 59 11/20/2024   ALT 29 11/20/2024   AST 19 11/20/2024   NA 139 11/20/2024   K 4.7 11/20/2024   CL 105 11/20/2024   CREATININE 1.23 11/20/2024   BUN 17 11/20/2024   CO2 27 11/20/2024   TSH 1.60 10/31/2023   INR 0.96 12/23/2010   HGBA1C 5.8 11/20/2024    Intervention   Assessment / Plan:   69 y.o. male with severe aortic stenosis, and moderated AI.  No evidence of coronary disease.  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  His  left main ostium is low at 8.4cm, thus he would not be a good candidate for TAVR.  We discussed the risks and benefits of a aortic valve replacement with a tissue valve.  I do not think it 4.3 cm anything will be needed for his aneurysm.  He would like to proceed with surgery.  We have agree to use a bioprosthetic aortic valve.      I  spent 40 minutes counseling the patient face to face.   Linnie MALVA Rayas 12/27/2024 8:41 AM       "

## 2024-12-27 NOTE — Progress Notes (Signed)
 "     [address]       [place] [address]             [phone]        [name] [facility] Medical Record #[id number] Date of Birth: 1956/10/21  Referring: Delford Maude BROCKS, MD Primary Care: Candise Aleene DEL, MD Primary Cardiologist:Peter Delford, MD  Chief Complaint:    Chief Complaint  Patient presents with   Aortic Stenosis    Further discuss surgery    History of Present Illness:     Anthony Paul is a 69 y.o. male who presents for surgical evaluation of severe aortic stenosis.  He was originally evaluated for TAVR, but was found to have a left main height of 8.4 mm.  In regards to his symptoms, he does admit to some shortness of breath with exertion but denies any chest pain or lightheadedness.  He sees a education officer, community twice a year.  No major changes since his last appointment.  He continues to have some dizziness, and shortness of breath, but denies any chest pain.   Past Medical History:  Diagnosis Date   Abnormal CT scan, chest    Blebs on CT chest but NORMAL PFTs- no COPD   Aortic stenosis    severe 2025--->pt to get tissue valve replacement 2026 (Dr. Kerrin)   Ascending aorta dilatation    needs annual CTA (Dr. Delford)   Bicuspid aortic valve    Echo 2014  EF 55-60% Mild to moderate AR mild AS with mean gradient 18 mmHg and peak of 25 mmHg. ? Bicuspid valve with dilated aortic root 4.0 cm.   BPH with obstruction/lower urinary tract symptoms    Chronic abdominal pain    Atypical features/unknown etiology.  Followed by [facility] pain clinic.   COPD (chronic obstructive pulmonary disease) (HCC)    diverticulosis    hx diverticulitis and partial colon resection   DJD (degenerative joint disease)    ED (erectile dysfunction)    Elevated blood pressure reading without diagnosis of hypertension    Hx of colonic polyp 2015   Insomnia    Lumbar spondylosis    Mixed hyperlipidemia    Prediabetes    Tobacco abuse    Vitamin B12 deficiency      Past Surgical History:  Procedure Laterality Date   CARDIAC CATHETERIZATION     09/2024 no signif CAD.   Carotid dopplers     03/2024 normal   COLON SURGERY  12/2010   For severe diverticulitis, 8 inches of colon was resected per patient report   COLONOSCOPY  01/13/2014   hx multiple adenomas.  Most recent 01/14/20->1 polyp->needs q5 yr colonoscopy (Dr. Leigh)   HEMORROIDECTOMY     INGUINAL HERNIA REPAIR Right 2015   Dr. Camellia Blush   RESECTION DISTAL CLAVICAL Right    RIGHT HEART CATH AND CORONARY ANGIOGRAPHY N/A 09/20/2024   Procedure: RIGHT HEART CATH AND CORONARY ANGIOGRAPHY;  Surgeon: Wonda Sharper, MD;  Location: [facility] INVASIVE CV LAB;  Service: Cardiovascular;  Laterality: N/A;   TONSILLECTOMY     TRANSTHORACIC ECHOCARDIOGRAM     03/2024, AS a bit worse (mod-to-severe), known bicuspid valve moderate AR.  08/2024 AS severe, aortic dilatation to 44 mm, grd II DD, otherwise normal.   UPPER GASTROINTESTINAL ENDOSCOPY     UPPER GI ENDOSCOPY  2015   Dr. Luis    Social History:  Social History   Tobacco Use  Smoking Status Every Day   Current  packs/day: 1.50   Average packs/day: 1.5 packs/day for 53.0 years (79.5 ttl pk-yrs)   Types: Cigarettes   Start date: 1973  Smokeless Tobacco Former   Types: Chew   Quit date: 12/19/2006  Tobacco Comments   Started smoking at age 10, current 1/2 ppd    Social History   Substance and Sexual Activity  Alcohol Use Yes   Alcohol/week: 5.0 standard drinks of alcohol   Types: 5 Standard drinks or equivalent per week   Comment: 1-2 drinks per day     Allergies  Allergen Reactions   Morphine Itching    Can take hydrocodone without issues     Current Outpatient Medications  Medication Sig Dispense Refill   acetaminophen  (TYLENOL ) 500 MG tablet Take 1,000 mg by mouth every 6 (six) hours as needed for mild pain (pain score 1-3).     albuterol  (VENTOLIN  HFA) 108 (90 Base) MCG/ACT inhaler INHALE 2 PUFFS INTO THE LUNGS EVERY 4  (FOUR) HOURS AS NEEDED FOR WHEEZING OR SHORTNESS OF BREATH. PLEASE GIVE GENERIC OR THE ONE THAT INSURANCE COVERS 18 each 2   aspirin  81 MG tablet Take 81 mg by mouth daily.     atorvastatin  (LIPITOR) 40 MG tablet TAKE 1 TABLET BY MOUTH EVERY DAY 90 tablet 3   celecoxib  (CELEBREX ) 200 MG capsule Take 200 mg by mouth daily.     Cholecalciferol (VITAMIN D ) 2000 UNITS CAPS Take 2,000 Units by mouth daily.     Cyanocobalamin  (VITAMIN B12 PO) Take 1 tablet by mouth daily.     icosapent  Ethyl (VASCEPA ) 1 g capsule Take 2 capsules (2 g total) by mouth 2 (two) times daily. 120 capsule 11   metoprolol  tartrate (LOPRESSOR ) 50 MG tablet Take one tablet by mouth as directed prior to CT scan 1 tablet 0   MOVANTIK  25 MG TABS tablet TAKE 1 TABLET DAILY FOR REGULAR BM'S 90 tablet 3   oxyCODONE-acetaminophen  (PERCOCET) 10-325 MG tablet Take 1 tablet by mouth every 4 (four) hours as needed for pain.     traZODone  (DESYREL ) 150 MG tablet Take      1/2 to 1 tablet       1 hour      before Bedtime for Sleep 90 tablet 3   Current Facility-Administered Medications  Medication Dose Route Frequency Provider Last Rate Last Admin   0.9 %  sodium chloride  infusion  500 mL Intravenous Once Armbruster, Elspeth SQUIBB, MD       ipratropium-albuterol  (DUONEB) 0.5-2.5 (3) MG/3ML nebulizer solution 3 mL  3 mL Nebulization Once Jeanine Knee, NP        (Not in a hospital admission)   Family History  Problem Relation Age of Onset   Heart disease Father    Benign prostatic hyperplasia Father    Diabetes Mother    Kidney disease Sister    Cancer Sister        in Spine   Colon cancer Neg Hx    Esophageal cancer Neg Hx    Stomach cancer Neg Hx    Rectal cancer Neg Hx      Review of Systems:   Review of Systems  Constitutional:  Negative for malaise/fatigue.  Respiratory:  Positive for shortness of breath. Negative for cough.   Cardiovascular:  Negative for chest pain and leg swelling.  Neurological: Negative.        Physical Exam: There were no vitals taken for this visit. Physical Exam Constitutional:      General: He is not in  acute distress.    Appearance: He is not ill-appearing.  HENT:     Head: Normocephalic and atraumatic.  Eyes:     Extraocular Movements: Extraocular movements intact.  Cardiovascular:     Rate and Rhythm: Normal rate.  Pulmonary:     Effort: Pulmonary effort is normal. No respiratory distress.  Abdominal:     General: Abdomen is flat. There is no distension.  Musculoskeletal:        General: Normal range of motion.     Cervical back: Normal range of motion.  Skin:    General: Skin is warm and dry.  Neurological:     General: No focal deficit present.     Mental Status: He is alert and oriented to person, place, and time.       Diagnostic Studies & Laboratory data: Cardiac Studies & Procedures   ______________________________________________________________________________________________ CARDIAC CATHETERIZATION  CARDIAC CATHETERIZATION 09/20/2024  Conclusion 1.  Patent coronary arteries with minimal irregularities, no significant stenoses 2.  Normal right heart catheterization: RA mean 8 mmHg RV 28/8 mmHg PA 25/10 mean 17 mmHg Pulmonary wedge pressure A-wave 13, V wave 10, mean 9 mmHg Cardiac output 4.5 L/min, cardiac index 1.92 3.  Calcified bicuspid aortic valve with restricted leaflet mobility  Recommendations: Cardiac surgical consultation for aortic valve replacement.  Patient with no significant CAD.  Suspect the low cardiac index is spurious as the patient has no clinical evidence of low output.  Findings Coronary Findings Diagnostic  Dominance: Right  Left Main Vessel is angiographically normal. The left main is normal.  It divides into the LAD, intermediate branch, and left circumflex.  Left Anterior Descending The vessel exhibits minimal luminal irregularities. The LAD is patent to the apex there is a mild intramyocardial bridge in  the midportion.  The vessel has no stenosis.  The diagonals are patent.  Ramus Intermedius Vessel is angiographically normal. Widely patent vessel with a focal bridging segment in the midportion  Left Circumflex The vessel exhibits minimal luminal irregularities.  Right Coronary Artery The vessel exhibits minimal luminal irregularities. The RCA is dominant.  The vessel is widely patent throughout with no significant stenosis.  The PDA and PLA branches are patent.  Intervention  No interventions have been documented.   STRESS TESTS  EXERCISE TOLERANCE TEST (ETT) 10/10/2016  Interpretation Summary  Blood pressure demonstrated a normal response to exercise.  There was no ST segment deviation noted during stress.  No T wave inversion was noted during stress.  Stress test is negative for ischemia   ECHOCARDIOGRAM  ECHOCARDIOGRAM COMPLETE 08/27/2024  Narrative ECHOCARDIOGRAM REPORT    Patient Name:   DARRILL VREELAND Date of Exam: 08/27/2024 Medical Rec #:  [id number]      Height:       73.7 in Accession #:    [id number]     Weight:       237.6 lb Date of Birth:  14-Jan-1956       BSA:          2.332 m Patient Age:    68 years       BP:           101/66 mmHg Patient Gender: M              HR:           72 bpm. Exam Location:  [address]  Procedure: 2D Echo, Cardiac Doppler and Color Doppler (Both Spectral and Color Flow Doppler were utilized during procedure).  Indications:  I35.0 Aortic Stenosis  History:        Patient has prior history of Echocardiogram examinations, most recent 03/19/2024. COPD; Risk Factors:Dyslipidemia and Current Smoker.  Sonographer:    Carl Rodgers-Jones RDCS Referring Phys: [id number] PETER C NISHAN  IMPRESSIONS   1. Left ventricular ejection fraction, by estimation, is 55 to 60%. The left ventricle has normal function. The left ventricle has no regional wall motion abnormalities. There is moderate concentric left ventricular hypertrophy.  Left ventricular diastolic parameters are consistent with Grade II diastolic dysfunction (pseudonormalization). 2. Right ventricular systolic function is normal. The right ventricular size is normal. 3. The mitral valve is normal in structure. No evidence of mitral valve regurgitation. No evidence of mitral stenosis. 4. The aortic valve is calcified. There is severe calcifcation of the aortic valve. There is severe thickening of the aortic valve. Aortic valve regurgitation is moderate. Severe aortic valve stenosis. Aortic regurgitation PHT measures 361 msec. Aortic valve area, by VTI measures 1.19 cm. Aortic valve mean gradient measures 42.0 mmHg. Aortic valve Vmax measures 3.86 m/s. 5. Aortic dilatation noted. There is moderate dilatation of the ascending aorta, measuring 44 mm. 6. The inferior vena cava is normal in size with greater than 50% respiratory variability, suggesting right atrial pressure of 3 mmHg.  FINDINGS Left Ventricle: Left ventricular ejection fraction, by estimation, is 55 to 60%. The left ventricle has normal function. The left ventricle has no regional wall motion abnormalities. The left ventricular internal cavity size was normal in size. There is moderate concentric left ventricular hypertrophy. Left ventricular diastolic parameters are consistent with Grade II diastolic dysfunction (pseudonormalization). Indeterminate filling pressures.  Right Ventricle: The right ventricular size is normal. No increase in right ventricular wall thickness. Right ventricular systolic function is normal.  Left Atrium: Left atrial size was normal in size.  Right Atrium: Right atrial size was normal in size.  Pericardium: There is no evidence of pericardial effusion.  Mitral Valve: The mitral valve is normal in structure. No evidence of mitral valve regurgitation. No evidence of mitral valve stenosis.  Tricuspid Valve: The tricuspid valve is normal in structure. Tricuspid valve  regurgitation is not demonstrated. No evidence of tricuspid stenosis.  The aortic valve is calcified. There is severe calcifcation of the aortic valve. There is severe thickening of the aortic valve. Aortic valve regurgitation is moderate. Severe aortic stenosis is present. .  Pulmonic Valve: The pulmonic valve was normal in structure. Pulmonic valve regurgitation is not visualized. No evidence of pulmonic stenosis.  Aorta: Aortic dilatation noted. There is moderate dilatation of the ascending aorta, measuring 44 mm.  Venous: The inferior vena cava is normal in size with greater than 50% respiratory variability, suggesting right atrial pressure of 3 mmHg.  IAS/Shunts: No atrial level shunt detected by color flow Doppler.   LEFT VENTRICLE PLAX 2D LVIDd:         4.50 cm   Diastology LVIDs:         2.70 cm   LV e' medial:    7.29 cm/s LV PW:         1.40 cm   LV E/e' medial:  12.7 LV IVS:        1.40 cm   LV e' lateral:   6.42 cm/s LVOT diam:     2.30 cm   LV E/e' lateral: 14.4 LV SV:         100 LV SV Index:   43 LVOT Area:     4.15 cm  RIGHT VENTRICLE             IVC RV Basal diam:  4.20 cm     IVC diam: 1.10 cm RV S prime:     19.00 cm/s TAPSE (M-mode): 2.4 cm  LEFT ATRIUM             Index        RIGHT ATRIUM           Index LA diam:        4.50 cm 1.93 cm/m   RA Area:     13.40 cm LA Vol (A2C):   67.8 ml 29.07 ml/m  RA Volume:   36.00 ml  15.44 ml/m LA Vol (A4C):   55.9 ml 23.97 ml/m LA Biplane Vol: 64.7 ml 27.74 ml/m AORTIC VALVE AV Area (Vmax):    1.20 cm AV Area (Vmean):   1.17 cm AV Area (VTI):     1.19 cm AV Vmax:           386.00 cm/s AV Vmean:          271.600 cm/s AV VTI:            0.844 m AV Peak Grad:      59.6 mmHg AV Mean Grad:      42.0 mmHg LVOT Vmax:         111.67 cm/s LVOT Vmean:        76.567 cm/s LVOT VTI:          0.241 m LVOT/AV VTI ratio: 0.29 AI PHT:            361 msec  AORTA Ao Root diam: 3.80 cm Ao Asc diam:  4.40  cm  MITRAL VALVE MV Area (PHT): 3.84 cm    SHUNTS MV Decel Time: 198 msec    Systemic VTI:  0.24 m MV E velocity: 92.50 cm/s  Systemic Diam: 2.30 cm MV A velocity: 81.75 cm/s MV E/A ratio:  1.13  Annabella Scarce MD Electronically signed by Annabella Scarce MD Signature Date/Time: 08/27/2024/7:29:26 PM    Final      CT SCANS  CT CORONARY MORPH W/CTA COR W/SCORE 09/30/2024  Addendum 10/02/2024 11:44 PM ADDENDUM REPORT: 10/02/2024 23:41  EXAM: OVER-READ INTERPRETATION  CT CHEST  The following report is an over-read performed by radiologist Dr. Franky Leff [facility] on 10/02/2024. This over-read does not include interpretation of cardiac or coronary anatomy or pathology. The coronary CTA interpretation by the cardiologist is attached.  COMPARISON:  03/01/2023  FINDINGS: Aneurysmal dilatation of the ascending thoracic aorta measuring up to 4.2 cm. Insert calcified aortic valve. No adenopathy. No confluent airspace opacities or effusions. No acute findings in the upper abdomen. Chest wall soft tissues are unremarkable. No acute bony abnormality.  IMPRESSION: Ascending thoracic aortic aneurysm.  No acute extra cardiac abnormality.   Electronically Signed By: Franky Crease M.D. On: 10/02/2024 23:41  Narrative CLINICAL DATA:  Severe Aortic Stenosis.  EXAM: Cardiac TAVR CT  TECHNIQUE: A non-contrast, gated CT scan was obtained with axial slices of 2.5 mm through the heart for aortic valve scoring. A 100 kV retrospective, gated, contrast cardiac scan was obtained. Gantry rotation speed was 230 msec and collimation was 0.63 mm. Nitroglycerin  was not given. The 3D dataset was reconstructed in systole with motion correction. The 3D data set was reconstructed in 5% intervals of the 0-95% of the R-R cycle. Systolic and diastolic phases were analyzed on a dedicated workstation using MPR, MIP, and VRT modes.  The patient received 100 cc of  contrast.  FINDINGS: Image quality: Excellent.  Noise artifact is: Limited.  Valve Morphology: Bicuspid aortic valve with fusion of the RCC/LCC with raphe. Bulky calcification of the raphe. Restricted leaflet motion in systole.  Aortic Valve Calcium  score: 2296  Aortic annular dimension:  Phase assessed: 25%  Annular area: 553 mm2  Annular perimeter: 85.1 mm  Max diameter: 29.6 mm  Min diameter: 24.2 mm  Annular and subannular calcification: None.  Membranous septum length: 11.8 mm  Optimal coplanar projection: RAO 16 CAU 10  Coronary Artery Height above Annulus:  Left Main: 8.4 mm  Right Coronary: 17.8 mm  Sinus of Valsalva Measurements:  Non-coronary: 36 mm  Right-coronary: 37 mm  Left-coronary: 38 mm  Max diameter: 40 mm cusp to cusp  Sinus of Valsalva Height:  Non-coronary: 24.2 mm  Right-coronary: 22.4 mm  Left-coronary: 18.8 mm  Sinotubular Junction: 38 mm  Ascending Thoracic Aorta: 44 mm (double oblique).  Coronary Arteries: Normal coronary origin. Right dominance. The study was performed without use of NTG and is insufficient for plaque evaluation. Please refer to recent cardiac catheterization for coronary assessment.  Cardiac Morphology:  Right Atrium: Right atrial size is within normal limits.  Right Ventricle: The right ventricular cavity is within normal limits.  Left Atrium: Left atrial size is normal in size with no left atrial appendage filling defect.  Left Ventricle: The ventricular cavity size is within normal limits.  Pulmonary arteries: Normal in size without proximal filling defect.  Pulmonary veins: Normal pulmonary venous drainage.  Pericardium: Normal thickness with no significant effusion or calcium  present.  Mitral Valve: The mitral valve is normal structure without significant calcification.  Extra-cardiac findings: See attached radiology report for non-cardiac structures.  IMPRESSION: 1. Bicuspid  aortic valve with fusion of the RCC/LCC with raphe. Bulky calcification of the raphe noted.  2. Annular measurements support a 29 mm S3 or 34 mm Evolut Pro.  3. No significant annular or subannular calcifications.  4. Shallow left main height (8.4 mm). Structural heart team discussion recommended.  5. Optimal Fluoroscopic Angle for Delivery: RAO 16 CAU 10  6. Moderate dilated of the ascending aorta up to 44 mm.  Darryle T. Barbaraann, MD  Electronically Signed: By: Darryle Barbaraann M.D. On: 10/02/2024 20:08   CT SCANS  CT CORONARY MORPH W/CTA COR W/SCORE 12/11/2018  Addendum 12/11/2018  2:02 PM ADDENDUM REPORT: 12/11/2018 13:59  CLINICAL DATA:  Chest pain  EXAM: Cardiac CTA  MEDICATIONS: Sub lingual nitro. 4 mg and lopressor  5mg   TECHNIQUE: The patient was scanned on a [employer] 192 scanner. Gantry rotation speed was 250 msecs. Collimation was. 6 mm . A 120 kV prospective scan was triggered in the ascending thoracic aorta at 140 HU's with full mA between 30-70% of the R-R interval . Average HR during the scan was 54 bpm. The 3D data set was interpreted on a dedicated work station using MPR, MIP and VRT modes. A total of 80 cc of contrast was used.  FINDINGS: Non-cardiac: See separate report from [facility]. No significant findings on limited lung and soft tissue windows.  Aortic valve was tri leaflet with severely calcified commissure between right and left cusps making motion functionally bicuspid  Ascending aortic root: 3.8 cm  STJ: 3.0 cm  Average Sinus 3.2 cm  Arch: 2.9 cm with normal branch vessels  Descending thoracic aorta: 2.2 cm  Calcium  score: Not done  Coronary Arteries: Right dominant with no anomalies  LM: Normal  LAD: Normal  IM: Normla  D1: Normal  Circumflex: Normal  OM1: Normal  OM2: Normal  RCA: Normal  PDA: Normal  PLA: Normal  IMPRESSION: 1. Functionally bicuspid AV with partial fusion of left and  right cusps. Recommend following degree of AS with echo  2.  Upper normal aortic root size 3.8 cm with normal arch vessels  3.  Normal right dominant coronary arteries  Maude Emmer   Electronically Signed By: Maude Emmer M.D. On: 12/11/2018 13:59  Narrative EXAM: OVER-READ INTERPRETATION  CT CHEST  The following report is an over-read performed by radiologist Dr. Franky Crease of [facility] on 12/11/2018. This over-read does not include interpretation of cardiac or coronary anatomy or pathology. The coronary CTA interpretation by the cardiologist is attached.  COMPARISON:  05/28/2012  FINDINGS: Vascular: Ascending aorta has a maximum diameter of 3.8 cm. Heart is normal size.  Mediastinum/Nodes: No adenopathy in the lower mediastinum or hila.  Lungs/Pleura: Dependent atelectasis.  No effusions.  Upper Abdomen: Imaging into the upper abdomen shows no acute findings.  Musculoskeletal: Chest wall soft tissues are unremarkable. No acute bony abnormality.  IMPRESSION: Ascending aorta upper limits normal in diameter at 3.8 cm. No acute extra cardiac abnormality.  Electronically Signed: By: Franky Crease M.D. On: 12/11/2018 11:26   CARDIAC MRI  MR CARDIAC MORPHOLOGY W WO CONTRAST 07/03/2012  Narrative Cardiac MRI/Aortic MRA  Indication: ? Bicuspid Aortic Valve R/O Aneurysm  Protocol:  The patient was scanned on a 1.5 Tesla GE magnet. Functional imaging was done using Fiesta sequences.  MRA and delayed enhancement images were done after 25 cc of Mulithance. Aortic root measurements were made axially using IIR black blood sequences as well as in LAO oblique on MRA.  Quantitative EF and regurgitant fraction were calculated on a dedicated [employer] work station  Findings:  All 4 cardiac chambers were normal in size and function. Quantitative EF was 60% (EDV 170cc, ESV 68cc, SV 102cc) There was no hyperenhancement or scar tissue  The  Aortic valve was functional type one bicuspid with fusion of the right and left cusps.  Valve opening was minimally restricted and mean gradient was only 16 mmHg by recent echo.  Regurgitant fraction was estimated at 25% suggesting mild AR although Fiesta sequences suggested more moderate visual AR.  AR was central from poor leaflet coaptation  Aortic MRA:  There was mild ascending aortic root dilatation at 3.9 cm at the level of the RPA.  The sinus of valsalva, arch and descending thoracic aorta was normal.  Descending aorta measures 2.3 cm.  The great vessels arose normally from the arch. There was no evendence of coarctation.  Impression:  1)    Normal LV size and function EF 60%  2)    No hyperenhancement 3)    Functionally type one bicuspid AV with mild AS and mild AR by regurgitant fraction although qualitatively looked more moderate 4)    Mild dilatation of ascending aortic root 3.9 cm with no coarctation  Cc: Dr Ethan Schilling  Original Report Authenticated By: [email or web address]   ______________________________________________________________________________________________     EKG: Sinus I have independently reviewed the above radiologic studies and discussed with the patient   Recent Lab Findings: Lab Results  Component Value Date   WBC 6.7 11/20/2024   HGB 15.3 11/20/2024   HCT 44.7 11/20/2024   PLT 174.0 11/20/2024   GLUCOSE 119 (H) 11/20/2024   CHOL 114 11/20/2024   TRIG 141.0 11/20/2024   HDL  26.40 (L) 11/20/2024   LDLCALC 59 11/20/2024   ALT 29 11/20/2024   AST 19 11/20/2024   NA 139 11/20/2024   K 4.7 11/20/2024   CL 105 11/20/2024   CREATININE 1.23 11/20/2024   BUN 17 11/20/2024   CO2 27 11/20/2024   TSH 1.60 10/31/2023   INR 0.96 12/23/2010   HGBA1C 5.8 11/20/2024    Intervention   Assessment / Plan:   69 y.o. male with severe aortic stenosis, and moderated AI.  No evidence of coronary disease.  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  His  left main ostium is low at 8.4cm, thus he would not be a good candidate for TAVR.  We discussed the risks and benefits of a aortic valve replacement with a tissue valve.  I do not think it 4.3 cm anything will be needed for his aneurysm.  He would like to proceed with surgery.  We have agree to use a bioprosthetic aortic valve.      I  spent 40 minutes counseling the patient face to face.   Linnie MALVA Rayas 12/27/2024 8:41 AM       "

## 2024-12-28 ENCOUNTER — Encounter: Payer: Self-pay | Admitting: Gastroenterology

## 2025-01-01 ENCOUNTER — Ambulatory Visit

## 2025-01-01 DIAGNOSIS — Z0000 Encounter for general adult medical examination without abnormal findings: Secondary | ICD-10-CM | POA: Diagnosis not present

## 2025-01-01 NOTE — Progress Notes (Signed)
 "  Chief Complaint  Patient presents with   Medicare Wellness     Subjective:   Anthony Paul is a 69 y.o. male who presents for a Medicare Annual Wellness Visit.  Visit info / Clinical Intake: Medicare Wellness Visit Type:: Subsequent Annual Wellness Visit Persons participating in visit and providing information:: patient Medicare Wellness Visit Mode:: Telephone If telephone:: video declined Since this visit was completed virtually, some vitals may be partially provided or unavailable. Missing vitals are due to the limitations of the virtual format.: Unable to obtain vitals - no equipment If Telephone or Video please confirm:: I connected with patient using audio/video enable telemedicine. I verified patient identity with two identifiers, discussed telehealth limitations, and patient agreed to proceed. Interpreter Needed?: No Pre-visit prep was completed: no AWV questionnaire completed by patient prior to visit?: no Living arrangements:: lives with spouse/significant other Patient's Overall Health Status Rating: good Typical amount of pain: some Does pain affect daily life?: (!) yes Are you currently prescribed opioids?: (!) yes  Dietary Habits and Nutritional Risks How many meals a day?: 2 Eats fruit and vegetables daily?: (!) no Most meals are obtained by: preparing own meals In the last 2 weeks, have you had any of the following?: none Diabetic:: no  Functional Status Activities of Daily Living (to include ambulation/medication): Independent Ambulation: Independent Medication Administration: Independent Home Management (perform basic housework or laundry): Independent Manage your own finances?: yes Primary transportation is: driving Concerns about vision?: no *vision screening is required for WTM* Concerns about hearing?: (!) yes Uses hearing aids?: (!) yes Hear whispered voice?: (!) no *in-person visit only*  Fall Screening Falls in the past year?: 0 Number of  falls in past year: 0 Was there an injury with Fall?: 0 Fall Risk Category Calculator: 0 Patient Fall Risk Level: Low Fall Risk  Fall Risk Patient at Risk for Falls Due to: No Fall Risks Fall risk Follow up: Falls evaluation completed  Home and Transportation Safety: All rugs have non-skid backing?: yes All stairs or steps have railings?: yes Grab bars in the bathtub or shower?: (!) no Have non-skid surface in bathtub or shower?: yes Good home lighting?: yes Regular seat belt use?: yes Hospital stays in the last year:: no  Cognitive Assessment Difficulty concentrating, remembering, or making decisions? : no Will 6CIT or Mini Cog be Completed: no 6CIT or Mini Cog Declined: patient alert, oriented, able to answer questions appropriately and recall recent events  Advance Directives (For Healthcare) Does Patient Have a Medical Advance Directive?: Yes Does patient want to make changes to medical advance directive?: No - Patient declined Type of Advance Directive: Healthcare Power of [place]; Living will Copy of Healthcare Power of Attorney in Chart?: No - copy requested Copy of Living Will in Chart?: No - copy requested  Reviewed/Updated  Reviewed/Updated: Reviewed All (Medical, Surgical, Family, Medications, Allergies, Care Teams, Patient Goals)    Allergies (verified) Morphine   Current Medications (verified) Outpatient Encounter Medications as of 01/01/2025  Medication Sig   acetaminophen  (TYLENOL ) 500 MG tablet Take 1,000 mg by mouth every 6 (six) hours as needed for mild pain (pain score 1-3).   albuterol  (VENTOLIN  HFA) 108 (90 Base) MCG/ACT inhaler INHALE 2 PUFFS INTO THE LUNGS EVERY 4 (FOUR) HOURS AS NEEDED FOR WHEEZING OR SHORTNESS OF BREATH. PLEASE GIVE GENERIC OR THE ONE THAT INSURANCE COVERS   aspirin  81 MG tablet Take 81 mg by mouth daily.   atorvastatin  (LIPITOR) 40 MG tablet TAKE 1 TABLET BY MOUTH  EVERY DAY   celecoxib  (CELEBREX ) 200 MG capsule Take 200 mg by  mouth daily.   Cholecalciferol (VITAMIN D ) 2000 UNITS CAPS Take 2,000 Units by mouth daily.   Cyanocobalamin  (VITAMIN B12 PO) Take 1 tablet by mouth daily.   icosapent  Ethyl (VASCEPA ) 1 g capsule Take 2 capsules (2 g total) by mouth 2 (two) times daily.   MOVANTIK  25 MG TABS tablet TAKE 1 TABLET DAILY FOR REGULAR BM'S   oxyCODONE-acetaminophen  (PERCOCET) 10-325 MG tablet Take 1 tablet by mouth every 4 (four) hours as needed for pain.   traZODone  (DESYREL ) 150 MG tablet Take      1/2 to 1 tablet       1 hour      before Bedtime for Sleep   Facility-Administered Encounter Medications as of 01/01/2025  Medication   0.9 %  sodium chloride  infusion   ipratropium-albuterol  (DUONEB) 0.5-2.5 (3) MG/3ML nebulizer solution 3 mL    History: Past Medical History:  Diagnosis Date   Abnormal CT scan, chest    Blebs on CT chest but NORMAL PFTs- no COPD   Aortic stenosis    severe 2025--->pt to get tissue valve replacement 2026 (Dr. Kerrin)   Ascending aorta dilatation    needs annual CTA (Dr. Delford)   Bicuspid aortic valve    Echo 2014  EF 55-60% Mild to moderate AR mild AS with mean gradient 18 mmHg and peak of 25 mmHg. ? Bicuspid valve with dilated aortic root 4.0 cm.   BPH with obstruction/lower urinary tract symptoms    Chronic abdominal pain    Atypical features/unknown etiology.  Followed by [facility].   COPD (chronic obstructive pulmonary disease) (HCC)    diverticulosis    hx diverticulitis and partial colon resection   DJD (degenerative joint disease)    ED (erectile dysfunction)    Elevated blood pressure reading without diagnosis of hypertension    Hx of colonic polyp 2015   Insomnia    Lumbar spondylosis    Mixed hyperlipidemia    Prediabetes    Tobacco abuse    Vitamin B12 deficiency    Past Surgical History:  Procedure Laterality Date   CARDIAC CATHETERIZATION     09/2024 no signif CAD.   Carotid dopplers     03/2024 normal   COLON SURGERY  12/2010   For  severe diverticulitis, 8 inches of colon was resected per patient report   COLONOSCOPY  01/13/2014   hx multiple adenomas.  Most recent 01/14/20->1 polyp->needs q5 yr colonoscopy (Dr. Leigh)   HEMORROIDECTOMY     INGUINAL HERNIA REPAIR Right 2015   Dr. Camellia Blush   RESECTION DISTAL CLAVICAL Right    RIGHT HEART CATH AND CORONARY ANGIOGRAPHY N/A 09/20/2024   Procedure: RIGHT HEART CATH AND CORONARY ANGIOGRAPHY;  Surgeon: Wonda Sharper, MD;  Location: [facility] INV[facility]IVE CV LAB;  Service: Cardiovascular;  Laterality: N/A;   TONSILLECTOMY     TRANSTHORACIC ECHOCARDIOGRAM     03/2024, AS a bit worse (mod-to-severe), known bicuspid valve moderate AR.  08/2024 AS severe, aortic dilatation to 44 mm, grd II DD, otherwise normal.   UPPER GASTROINTESTINAL ENDOSCOPY     UPPER GI ENDOSCOPY  2015   Dr. Luis   Family History  Problem Relation Age of Onset   Heart disease Father    Benign prostatic hyperplasia Father    Diabetes Mother    Kidney disease Sister    Cancer Sister        in Spine   Colon  cancer Neg Hx    Esophageal cancer Neg Hx    Stomach cancer Neg Hx    Rectal cancer Neg Hx    Social History   Occupational History   Occupation: retired Control And Instrumentation Engineer: [employer]  Tobacco Use   Smoking status: Every Day    Current packs/day: 1.50    Average packs/day: 1.5 packs/day for 53.0 years (79.6 ttl pk-yrs)    Types: Cigarettes    Start date: 1973   Smokeless tobacco: Former    Types: Chew    Quit date: 12/19/2006   Tobacco comments:    Started smoking at age 89, current 1/2 ppd  Vaping Use   Vaping status: Never Used  Substance and Sexual Activity   Alcohol use: Yes    Alcohol/week: 5.0 standard drinks of alcohol    Types: 5 Standard drinks or equivalent per week    Comment: 1-2 drinks per day   Drug use: No   Sexual activity: Yes    Partners: Female   Tobacco Counseling Ready to quit: Not Answered Counseling given: Not Answered Tobacco comments: Started smoking at  age 24, current 1/2 ppd  SDOH Screenings   Food Insecurity: No Food Insecurity (01/01/2025)  Housing: Unknown (01/01/2025)  Transportation Needs: No Transportation Needs (01/01/2025)  Utilities: Not At Risk (01/01/2025)  Depression (PHQ2-9): Low Risk (01/01/2025)  Physical Activity: Inactive (01/01/2025)  Social Connections: Socially Integrated (01/01/2025)  Stress: No Stress Concern Present (01/01/2025)  Tobacco Use: High Risk (01/01/2025)  Health Literacy: Adequate Health Literacy (01/01/2025)   See flowsheets for full screening details  Depression Screen PHQ 2 & 9 Depression Scale- Over the past 2 weeks, how often have you been bothered by any of the following problems? Little interest or pleasure in doing things: 0 Feeling down, depressed, or hopeless (PHQ Adolescent also includes...irritable): 0 PHQ-2 Total Score: 0 Trouble falling or staying asleep, or sleeping too much: 0 Feeling tired or having little energy: 0 Poor appetite or overeating (PHQ Adolescent also includes...weight loss): 0 Feeling bad about yourself - or that you are a failure or have let yourself or your family down: 0 Trouble concentrating on things, such as reading the newspaper or watching television (PHQ Adolescent also includes...like school work): 0 Moving or speaking so slowly that other people could have noticed. Or the opposite - being so fidgety or restless that you have been moving around a lot more than usual: 0 Thoughts that you would be better off dead, or of hurting yourself in some way: 0 PHQ-9 Total Score: 0 If you checked off any problems, how difficult have these problems made it for you to do your work, take care of things at home, or get along with other people?: Not difficult at all     Goals Addressed             This Visit's Progress    Patient Stated       Live thru surgery             Objective:    There were no vitals filed for this visit. There is no height or weight on file  to calculate BMI.  Hearing/Vision screen No results found. Immunizations and Health Maintenance Health Maintenance  Topic Date Due   Zoster Vaccines- Shingrix (1 of 2) Never done   COVID-19 Vaccine (3 - Pfizer risk series) 03/30/2020   Medicare Annual Wellness (AWV)  06/12/2024   Colonoscopy  01/13/2025   DTaP/Tdap/Td (3 - Td  or Tdap) 03/08/2025   Lung Cancer Screening  09/30/2025   Pneumococcal Vaccine: 50+ Years  Completed   Influenza Vaccine  Completed   Hepatitis C Screening  Completed   Meningococcal B Vaccine  Aged Out        Assessment/Plan:  This is a routine wellness examination for Anthony Paul.  Patient Care Team: Candise Aleene DEL, MD as PCP - General (Family Medicine) Delford Maude BROCKS, MD as PCP - Cardiology (Cardiology)  I have personally reviewed and noted the following in the patients chart:   Medical and social history Use of alcohol, tobacco or illicit drugs  Current medications and supplements including opioid prescriptions. Functional ability and status Nutritional status Physical activity Advanced directives List of other physicians Hospitalizations, surgeries, and ER visits in previous 12 months Vitals Screenings to include cognitive, depression, and falls Referrals and appointments  No orders of the defined types were placed in this encounter.  In addition, I have reviewed and discussed with patient certain preventive protocols, quality metrics, and best practice recommendations. A written personalized care plan for preventive services as well as general preventive health recommendations were provided to patient.   Shanda LELON Sharps, CMA   01/01/2025   No follow-ups on file.  After Visit Summary: (Declined) Due to this being a telephonic visit, with patients personalized plan was offered to patient but patient Declined AVS at this time   Nurse Notes: Non-Face to Face or Face to Face 10 minute visit Encounter   Anthony Paul,  Thank you for taking the  time for your Medicare Wellness Visit. I appreciate your continued commitment to your health goals. Please review the care plan we discussed, and feel free to reach out if I can assist you further.  Please note that Annual Wellness Visits do not include a physical exam. Some assessments may be limited, especially if the visit was conducted virtually. If needed, we may recommend an in-person follow-up with your provider.  Ongoing Care Seeing your primary care provider every 3 to 6 months helps us  monitor your health and provide consistent, personalized care.   Referrals If a referral was made during today's visit and you haven't received any updates within two weeks, please contact the referred provider directly to check on the status.  Recommended Screenings:  Health Maintenance  Topic Date Due   Zoster (Shingles) Vaccine (1 of 2) Never done   COVID-19 Vaccine (3 - Pfizer risk series) 03/30/2020   Medicare Annual Wellness Visit  06/12/2024   Colon Cancer Screening  01/13/2025   DTaP/Tdap/Td vaccine (3 - Td or Tdap) 03/08/2025   Screening for Lung Cancer  09/30/2025   Pneumococcal Vaccine for age over 54  Completed   Flu Shot  Completed   Hepatitis C Screening  Completed   Meningitis B Vaccine  Aged Out       01/01/2025   10:21 AM  Advanced Directives  Does Patient Have a Medical Advance Directive? Yes  Type of Estate Agent of [place];Living will  Does patient want to make changes to medical advance directive? No - Patient declined  Copy of Healthcare Power of Attorney in Chart? No - copy requested    Vision: Annual vision screenings are recommended for early detection of glaucoma, cataracts, and diabetic retinopathy. These exams can also reveal signs of chronic conditions such as diabetes and high blood pressure.  Dental: Annual dental screenings help detect early signs of oral cancer, gum disease, and other conditions linked to overall health, including  heart disease and diabetes.  Please see the attached documents for additional preventive care recommendations.  "

## 2025-01-01 NOTE — Patient Instructions (Signed)
 Health Maintenance, Male  Adopting a healthy lifestyle and getting preventive care are important in promoting health and wellness. Ask your health care provider about:  The right schedule for you to have regular tests and exams.  Things you can do on your own to prevent diseases and keep yourself healthy.  What should I know about diet, weight, and exercise?  Eat a healthy diet    Eat a diet that includes plenty of vegetables, fruits, low-fat dairy products, and lean protein.  Do not eat a lot of foods that are high in solid fats, added sugars, or sodium.  Maintain a healthy weight  Body mass index (BMI) is a measurement that can be used to identify possible weight problems. It estimates body fat based on height and weight. Your health care provider can help determine your BMI and help you achieve or maintain a healthy weight.  Get regular exercise  Get regular exercise. This is one of the most important things you can do for your health. Most adults should:  Exercise for at least 150 minutes each week. The exercise should increase your heart rate and make you sweat (moderate-intensity exercise).  Do strengthening exercises at least twice a week. This is in addition to the moderate-intensity exercise.  Spend less time sitting. Even light physical activity can be beneficial.  Watch cholesterol and blood lipids  Have your blood tested for lipids and cholesterol at 70 years of age, then have this test every 5 years.  You may need to have your cholesterol levels checked more often if:  Your lipid or cholesterol levels are high.  You are older than 70 years of age.  You are at high risk for heart disease.  What should I know about cancer screening?  Many types of cancers can be detected early and may often be prevented. Depending on your health history and family history, you may need to have cancer screening at various ages. This may include screening for:  Colorectal cancer.  Prostate cancer.  Skin cancer.  Lung  cancer.  What should I know about heart disease, diabetes, and high blood pressure?  Blood pressure and heart disease  High blood pressure causes heart disease and increases the risk of stroke. This is more likely to develop in people who have high blood pressure readings or are overweight.  Talk with your health care provider about your target blood pressure readings.  Have your blood pressure checked:  Every 3-5 years if you are 24-52 years of age.  Every year if you are 3 years old or older.  If you are between the ages of 60 and 72 and are a current or former smoker, ask your health care provider if you should have a one-time screening for abdominal aortic aneurysm (AAA).  Diabetes  Have regular diabetes screenings. This checks your fasting blood sugar level. Have the screening done:  Once every three years after age 66 if you are at a normal weight and have a low risk for diabetes.  More often and at a younger age if you are overweight or have a high risk for diabetes.  What should I know about preventing infection?  Hepatitis B  If you have a higher risk for hepatitis B, you should be screened for this virus. Talk with your health care provider to find out if you are at risk for hepatitis B infection.  Hepatitis C  Blood testing is recommended for:  Everyone born from [birth year] through 1966.  Anyone  with known risk factors for hepatitis C.  Sexually transmitted infections (STIs)  You should be screened each year for STIs, including gonorrhea and chlamydia, if:  You are sexually active and are younger than 70 years of age.  You are older than 70 years of age and your health care provider tells you that you are at risk for this type of infection.  Your sexual activity has changed since you were last screened, and you are at increased risk for chlamydia or gonorrhea. Ask your health care provider if you are at risk.  Ask your health care provider about whether you are at high risk for HIV. Your health care provider  may recommend a prescription medicine to help prevent HIV infection. If you choose to take medicine to prevent HIV, you should first get tested for HIV. You should then be tested every 3 months for as long as you are taking the medicine.  Follow these instructions at home:  Alcohol use  Do not drink alcohol if your health care provider tells you not to drink.  If you drink alcohol:  Limit how much you have to 0-2 drinks a day.  Know how much alcohol is in your drink. In the [place] one drink equals one 12 oz bottle of beer (355 mL), one 5 oz glass of wine (148 mL), or one 1 oz glass of hard liquor (44 mL).  Lifestyle  Do not use any products that contain nicotine or tobacco. These products include cigarettes, chewing tobacco, and vaping devices, such as e-cigarettes. If you need help quitting, ask your health care provider.  Do not use street drugs.  Do not share needles.  Ask your health care provider for help if you need support or information about quitting drugs.  General instructions  Schedule regular health, dental, and eye exams.  Stay current with your vaccines.  Tell your health care provider if:  You often feel depressed.  You have ever been abused or do not feel safe at home.  Summary  Adopting a healthy lifestyle and getting preventive care are important in promoting health and wellness.  Follow your health care provider's instructions about healthy diet, exercising, and getting tested or screened for diseases.  Follow your health care provider's instructions on monitoring your cholesterol and blood pressure.  This information is not intended to replace advice given to you by your health care provider. Make sure you discuss any questions you have with your health care provider.  Document Revised: 04/26/2021 Document Reviewed: 04/26/2021  Elsevier Patient Education  2024 [employer].

## 2025-01-08 NOTE — Pre-Procedure Instructions (Signed)
 Surgical Instructions   Your procedure is scheduled on January 13, 2025. Report to [facility] Main Entrance A at 08:00 A.M., then check in with the Admitting office. Any questions or running late day of surgery: call [phone]  Questions prior to your surgery date: call [phone], Monday-Friday, 8am-4pm. If you experience any cold or flu symptoms such as cough, fever, chills, shortness of breath, etc. between now and your scheduled surgery, please notify us  at the above number.     Remember:  Do not eat or drink after midnight the night before your surgery    Take these medicines the morning of surgery with A SIP OF WATER   MOVANTIK     May take these medicines IF NEEDED: albuterol  (VENTOLIN  HFA) - please bring with you to the hospital oxyCODONE-acetaminophen  (PERCOCET)    One week prior to surgery, STOP taking any Aleve, Naproxen, Ibuprofen, Motrin, Advil, Goody's, BC's, all herbal medications, fish oil, and non-prescription vitamins. This includes your celecoxib  (CELEBREX ) .  Follow your surgeons instructions on when to stop icosapent  Ethyl (VASCEPA ). If you have not received any instructions, call the surgeon's office.   You may continue to take aspirin  up until the day before surgery. Your last dose should be 01/12/25.                     Do NOT Smoke (Tobacco/Vaping) for 24 hours prior to your procedure.  If you use a CPAP at night, you may bring your mask/headgear for your overnight stay.   You will be asked to remove any contacts, glasses, piercing's, hearing aid's, dentures/partials prior to surgery. Please bring cases for these items if needed.    Your surgeon will determine if you are to be admitted or discharged the same day.  Patients discharged the day of surgery will not be allowed to drive home, and someone needs to stay with them for 24 hours.  SURGICAL WAITING ROOM VISITATION Patients may have no more than 2 support people in the waiting area - these  visitors may rotate.   Pre-op nurse will coordinate an appropriate time for 2 ADULT support persons, who may not rotate, to accompany patient in pre-op.  Children under the age of 28 must have an adult with them who is not the patient and must remain in the main waiting area with an adult.  If the patient needs to stay at the hospital during part of their recovery, the visitor guidelines for inpatient rooms apply.  Please refer to the [facility] website for the visitor guidelines for any additional information.   If you received a COVID test during your pre-op visit  it is requested that you wear a mask when out in public, stay away from anyone that may not be feeling well and notify your surgeon if you develop symptoms. If you have been in contact with anyone that has tested positive in the last 10 days please notify you surgeon.      Pre-operative CHG Bathing Instructions   You can play a key role in reducing the risk of infection after surgery. Your skin needs to be as free of germs as possible. You can reduce the number of germs on your skin by washing with CHG (chlorhexidine gluconate) soap before surgery. CHG is an antiseptic soap that kills germs and continues to kill germs even after washing.   DO NOT use if you have an allergy to chlorhexidine/CHG or antibacterial soaps. If your skin becomes reddened or irritated, stop  using the CHG and notify one of our RNs at [phone].              TAKE A SHOWER THE NIGHT BEFORE SURGERY   Please keep in mind the following:  DO NOT shave, including legs and underarms, 48 hours prior to surgery.   You may shave your face before/day of surgery.  Place clean sheets on your bed the night before surgery Use a clean washcloth (not used since being washed) for shower. DO NOT sleep with pet's night before surgery.  CHG Shower Instructions:  Wash your face and private area with normal soap. If you choose to wash your hair, wash first with your  normal shampoo.  After you use shampoo/soap, rinse your hair and body thoroughly to remove shampoo/soap residue.  Turn the water  OFF and apply half the bottle of CHG soap to a CLEAN washcloth.  Apply CHG soap ONLY FROM YOUR NECK DOWN TO YOUR TOES (washing for 3-5 minutes)  DO NOT use CHG soap on face, private areas, open wounds, or sores.  Pay special attention to the area where your surgery is being performed.  If you are having back surgery, having someone wash your back for you may be helpful. Wait 2 minutes after CHG soap is applied, then you may rinse off the CHG soap.  Pat dry with a clean towel  Put on clean pajamas    Additional instructions for the day of surgery: If you choose, you may shower the morning of surgery with an antibacterial soap.  DO NOT APPLY any lotions, deodorants, cologne, or perfumes.   Do not wear jewelry or makeup Do not wear nail polish, gel polish, artificial nails, or any other type of covering on natural nails (fingers and toes) Do not bring valuables to the hospital. [facility] is not responsible for valuables/personal belongings. Put on clean/comfortable clothes.  Please brush your teeth.  Ask your nurse before applying any prescription medications to the skin.

## 2025-01-09 ENCOUNTER — Encounter (HOSPITAL_COMMUNITY): Payer: Self-pay

## 2025-01-09 ENCOUNTER — Other Ambulatory Visit: Payer: Self-pay

## 2025-01-09 ENCOUNTER — Encounter (HOSPITAL_COMMUNITY)
Admission: RE | Admit: 2025-01-09 | Discharge: 2025-01-09 | Disposition: A | Discharge status: Home / Self Care | Source: Ambulatory Visit | Attending: Thoracic Surgery (Cardiothoracic Vascular Surgery) | Admitting: Thoracic Surgery (Cardiothoracic Vascular Surgery)

## 2025-01-09 ENCOUNTER — Ambulatory Visit (HOSPITAL_COMMUNITY)
Admission: RE | Admit: 2025-01-09 | Discharge: 2025-01-09 | Disposition: A | Discharge status: Home / Self Care | Source: Ambulatory Visit | Attending: Thoracic Surgery (Cardiothoracic Vascular Surgery) | Admitting: Thoracic Surgery (Cardiothoracic Vascular Surgery)

## 2025-01-09 VITALS — BP 133/82 | HR 89 | Temp 98.8°F | Resp 18 | Ht 73.0 in | Wt 233.6 lb

## 2025-01-09 DIAGNOSIS — I350 Nonrheumatic aortic (valve) stenosis: Secondary | ICD-10-CM

## 2025-01-09 DIAGNOSIS — R9431 Abnormal electrocardiogram [ECG] [EKG]: Secondary | ICD-10-CM | POA: Insufficient documentation

## 2025-01-09 DIAGNOSIS — Z01818 Encounter for other preprocedural examination: Secondary | ICD-10-CM | POA: Diagnosis present

## 2025-01-09 LAB — URINALYSIS, ROUTINE W REFLEX MICROSCOPIC
Bilirubin Urine: NEGATIVE
Glucose, UA: 50 mg/dL — AB
Hgb urine dipstick: NEGATIVE
Ketones, ur: NEGATIVE mg/dL
Leukocytes,Ua: NEGATIVE
Nitrite: NEGATIVE
Protein, ur: NEGATIVE mg/dL
Specific Gravity, Urine: 1.026 (ref 1.005–1.030)
pH: 5 (ref 5.0–8.0)

## 2025-01-09 LAB — COMPREHENSIVE METABOLIC PANEL WITH GFR
ALT: 21 U/L (ref 0–44)
AST: 19 U/L (ref 15–41)
Albumin: 4.1 g/dL (ref 3.5–5.0)
Alkaline Phosphatase: 59 U/L (ref 38–126)
Anion gap: 10 (ref 5–15)
BUN: 17 mg/dL (ref 8–23)
CO2: 26 mmol/L (ref 22–32)
Calcium: 9.4 mg/dL (ref 8.9–10.3)
Chloride: 104 mmol/L (ref 98–111)
Creatinine, Ser: 1.1 mg/dL (ref 0.61–1.24)
GFR, Estimated: >60 mL/min
Glucose, Bld: 121 mg/dL — ABNORMAL HIGH (ref 70–99)
Potassium: 4.2 mmol/L (ref 3.5–5.1)
Sodium: 140 mmol/L (ref 135–145)
Total Bilirubin: 0.3 mg/dL (ref 0.0–1.2)
Total Protein: 6.4 g/dL — ABNORMAL LOW (ref 6.5–8.1)

## 2025-01-09 LAB — HEMOGLOBIN A1C
Hgb A1c MFr Bld: 5.8 % — ABNORMAL HIGH (ref 4.8–5.6)
Mean Plasma Glucose: 119.76 mg/dL

## 2025-01-09 LAB — CBC
HCT: 42.8 % (ref 39.0–52.0)
Hemoglobin: 14.8 g/dL (ref 13.0–17.0)
MCH: 32 pg (ref 26.0–34.0)
MCHC: 34.6 g/dL (ref 30.0–36.0)
MCV: 92.6 fL (ref 80.0–100.0)
Platelets: 188 K/uL (ref 150–400)
RBC: 4.62 MIL/uL (ref 4.22–5.81)
RDW: 12.6 % (ref 11.5–15.5)
WBC: 7 K/uL (ref 4.0–10.5)
nRBC: 0 % (ref 0.0–0.2)

## 2025-01-09 LAB — SURGICAL PCR SCREEN
MRSA, PCR: NEGATIVE
Staphylococcus aureus: NEGATIVE

## 2025-01-09 LAB — PROTIME-INR
INR: 1 (ref 0.8–1.2)
Prothrombin Time: 14 s (ref 11.4–15.2)

## 2025-01-09 LAB — APTT: aPTT: 29 s (ref 24–36)

## 2025-01-09 NOTE — Progress Notes (Addendum)
 PCP - Dr. Aleene Hockey Cardiologist - Dr. Maude Quint - last office visit 09/18/2024  PPM/ICD - Denies Device Orders - n/a Rep Notified - n/a  Chest x-ray - 01/09/2025 EKG - 01/09/2025 Stress Test - Per pt, many years ago ECHO - 08/27/2024 Cardiac Cath - 09/20/2024  Sleep Study - Denies CPAP - n/a  No DM  Last dose of GLP1 agonist- n/a GLP1 instructions: n/a  Blood Thinner Instructions: n/a Aspirin  Instructions: Pt instructed to continue taking ASA through the day before surgery and none the morning of surgery  NPO after midnight  COVID TEST- n/a   Anesthesia review: Yes. Abnormal EKG and hx of COPD. Per [facility] office, pt is to stop taking his Vascepa  starting today, January 22nd. Pt made aware.  Patient denies shortness of breath, fever, cough and chest pain at PAT appointment. Pt denies any respiratory illness/infection in the last two months.   All instructions explained to the patient, with a verbal understanding of the material. Patient agrees to go over the instructions while at home for a better understanding. Patient also instructed to self quarantine after being tested for COVID-19. The opportunity to ask questions was provided.

## 2025-01-10 MED ORDER — CEFAZOLIN SODIUM-DEXTROSE 2-4 GM/100ML-% IV SOLN
2.0000 g | INTRAVENOUS | Status: AC
  Administered 2025-01-13: 2 g via INTRAVENOUS
  Filled 2025-01-10: qty 100

## 2025-01-10 MED ORDER — DEXMEDETOMIDINE HCL IN NACL 400 MCG/100ML IV SOLN
0.1000 ug/kg/h | INTRAVENOUS | Status: AC
  Administered 2025-01-13: .7 ug/kg/h via INTRAVENOUS
  Filled 2025-01-10: qty 100

## 2025-01-10 MED ORDER — NOREPINEPHRINE 4 MG/250ML-% IV SOLN
0.0000 ug/min | INTRAVENOUS | Status: DC
  Filled 2025-01-10: qty 250

## 2025-01-10 MED ORDER — EPINEPHRINE HCL 5 MG/250ML IV SOLN IN NS
0.0000 ug/min | INTRAVENOUS | Status: DC
  Filled 2025-01-10: qty 250

## 2025-01-10 MED ORDER — MANNITOL 20 % IV SOLN
INTRAVENOUS | Status: DC
  Filled 2025-01-10: qty 13

## 2025-01-10 MED ORDER — HEPARIN 30,000 UNITS/1000 ML (OHS) CELLSAVER SOLUTION
Status: DC
  Filled 2025-01-10: qty 1000

## 2025-01-10 MED ORDER — NITROGLYCERIN IN D5W 200-5 MCG/ML-% IV SOLN
2.0000 ug/min | INTRAVENOUS | Status: DC
  Filled 2025-01-10: qty 250

## 2025-01-10 MED ORDER — MILRINONE LACTATE IN DEXTROSE 20-5 MG/100ML-% IV SOLN
0.3000 ug/kg/min | INTRAVENOUS | Status: DC
  Filled 2025-01-10: qty 100

## 2025-01-10 MED ORDER — VANCOMYCIN HCL 1.5 G IV SOLR
1500.0000 mg | INTRAVENOUS | Status: AC
  Administered 2025-01-13: 1500 mg via INTRAVENOUS
  Filled 2025-01-10: qty 30

## 2025-01-10 MED ORDER — POTASSIUM CHLORIDE 2 MEQ/ML IV SOLN
80.0000 meq | INTRAVENOUS | Status: DC
  Filled 2025-01-10: qty 40

## 2025-01-10 MED ORDER — PLASMA-LYTE A IV SOLN
INTRAVENOUS | Status: DC
  Filled 2025-01-10: qty 2.5

## 2025-01-10 MED ORDER — TRANEXAMIC ACID 1000 MG/10ML IV SOLN
1.5000 mg/kg/h | INTRAVENOUS | Status: AC
  Administered 2025-01-13: 1.5 mg/kg/h via INTRAVENOUS
  Filled 2025-01-10: qty 25

## 2025-01-10 MED ORDER — TRANEXAMIC ACID (OHS) BOLUS VIA INFUSION
15.0000 mg/kg | INTRAVENOUS | Status: AC
  Administered 2025-01-13: 1590 mg via INTRAVENOUS
  Filled 2025-01-10: qty 1590

## 2025-01-10 MED ORDER — PHENYLEPHRINE HCL-NACL 20-0.9 MG/250ML-% IV SOLN
30.0000 ug/min | INTRAVENOUS | Status: AC
  Administered 2025-01-13: 25 ug/min via INTRAVENOUS
  Filled 2025-01-10: qty 250

## 2025-01-10 MED ORDER — TRANEXAMIC ACID (OHS) PUMP PRIME SOLUTION
2.0000 mg/kg | INTRAVENOUS | Status: DC
  Filled 2025-01-10: qty 2.12

## 2025-01-10 MED ORDER — INSULIN REGULAR(HUMAN) IN NACL 100-0.9 UT/100ML-% IV SOLN
INTRAVENOUS | Status: AC
  Administered 2025-01-13: .9 [IU]/h via INTRAVENOUS
  Filled 2025-01-10: qty 100

## 2025-01-12 NOTE — Anesthesia Preprocedure Evaluation (Signed)
"                                    Anesthesia Evaluation  Patient identified by MRN, date of birth, ID band Patient awake    Reviewed: Allergy & Precautions, NPO status , Patient's Chart, lab work & pertinent test results  Airway Mallampati: II  TM Distance: >3 FB Neck ROM: Limited    Dental  (+) Dental Advisory Given, Teeth Intact   Pulmonary pneumonia, resolved, COPD, Current Smoker and Patient abstained from smoking.   Pulmonary exam normal breath sounds clear to auscultation       Cardiovascular + Valvular Problems/Murmurs AS and AI  Rhythm:Regular Rate:Normal + Systolic murmurs Echo 08/2024 1. Left ventricular ejection fraction, by estimation, is 55 to 60%. The  left ventricle has normal function. The left ventricle has no regional  wall motion abnormalities. There is moderate concentric left ventricular  hypertrophy. Left ventricular  diastolic parameters are consistent with Grade II diastolic dysfunction  (pseudonormalization).   2. Right ventricular systolic function is normal. The right ventricular  size is normal.   3. The mitral valve is normal in structure. No evidence of mitral valve  regurgitation. No evidence of mitral stenosis.   4. The aortic valve is calcified. There is severe calcifcation of the  aortic valve. There is severe thickening of the aortic valve. Aortic valve  regurgitation is moderate. Severe aortic valve stenosis. Aortic  regurgitation PHT measures 361 msec. Aortic  valve area, by VTI measures 1.19 cm. Aortic valve mean gradient measures  42.0 mmHg. Aortic valve Vmax measures 3.86 m/s.   5. Aortic dilatation noted. There is moderate dilatation of the ascending  aorta, measuring 44 mm.   6. The inferior vena cava is normal in size with greater than 50%  respiratory variability, suggesting right atrial pressure of 3 mmHg.     Neuro/Psych negative neurological ROS     GI/Hepatic negative GI ROS, Neg liver ROS,,,  Endo/Other   negative endocrine ROS    Renal/GU negative Renal ROS     Musculoskeletal  (+) Arthritis ,    Abdominal   Peds  Hematology negative hematology ROS (+)   Anesthesia Other Findings   Reproductive/Obstetrics                              Anesthesia Physical Anesthesia Plan  ASA: 4  Anesthesia Plan: General   Post-op Pain Management:    Induction: Intravenous  PONV Risk Score and Plan: Treatment may vary due to age or medical condition  Airway Management Planned: Oral ETT  Additional Equipment: Arterial line, CVP, PA Cath, TEE and Ultrasound Guidance Line Placement  Intra-op Plan:   Post-operative Plan: Post-operative intubation/ventilation  Informed Consent: I have reviewed the patients History and Physical, chart, labs and discussed the procedure including the risks, benefits and alternatives for the proposed anesthesia with the patient or authorized representative who has indicated his/her understanding and acceptance.     Dental advisory given  Plan Discussed with: CRNA  Anesthesia Plan Comments:          Anesthesia Quick Evaluation  "

## 2025-01-13 ENCOUNTER — Encounter (HOSPITAL_COMMUNITY): Payer: Self-pay | Admitting: Thoracic Surgery (Cardiothoracic Vascular Surgery)

## 2025-01-13 ENCOUNTER — Inpatient Hospital Stay (HOSPITAL_COMMUNITY): Payer: Self-pay | Admitting: Certified Registered"

## 2025-01-13 ENCOUNTER — Inpatient Hospital Stay (HOSPITAL_COMMUNITY)
Admission: RE | Admit: 2025-01-13 | Source: Home / Self Care | Admitting: Thoracic Surgery (Cardiothoracic Vascular Surgery)

## 2025-01-13 ENCOUNTER — Inpatient Hospital Stay (HOSPITAL_COMMUNITY)

## 2025-01-13 ENCOUNTER — Other Ambulatory Visit: Payer: Self-pay | Admitting: Physician Assistant

## 2025-01-13 ENCOUNTER — Encounter (HOSPITAL_COMMUNITY): Payer: Self-pay | Admitting: Physician Assistant

## 2025-01-13 ENCOUNTER — Encounter (HOSPITAL_COMMUNITY)
Admission: RE | Disposition: A | Payer: Self-pay | Source: Home / Self Care | Attending: Thoracic Surgery (Cardiothoracic Vascular Surgery)

## 2025-01-13 DIAGNOSIS — Z952 Presence of prosthetic heart valve: Secondary | ICD-10-CM

## 2025-01-13 DIAGNOSIS — I3139 Other pericardial effusion (noninflammatory): Secondary | ICD-10-CM | POA: Diagnosis not present

## 2025-01-13 DIAGNOSIS — Q2381 Bicuspid aortic valve: Secondary | ICD-10-CM | POA: Diagnosis not present

## 2025-01-13 DIAGNOSIS — G8194 Hemiplegia, unspecified affecting left nondominant side: Secondary | ICD-10-CM | POA: Diagnosis not present

## 2025-01-13 DIAGNOSIS — R531 Weakness: Secondary | ICD-10-CM

## 2025-01-13 DIAGNOSIS — Z48813 Encounter for surgical aftercare following surgery on the respiratory system: Secondary | ICD-10-CM | POA: Diagnosis not present

## 2025-01-13 DIAGNOSIS — Z860100 Personal history of colon polyps, unspecified: Secondary | ICD-10-CM

## 2025-01-13 DIAGNOSIS — E785 Hyperlipidemia, unspecified: Secondary | ICD-10-CM

## 2025-01-13 DIAGNOSIS — J449 Chronic obstructive pulmonary disease, unspecified: Secondary | ICD-10-CM | POA: Diagnosis not present

## 2025-01-13 DIAGNOSIS — Z8701 Personal history of pneumonia (recurrent): Secondary | ICD-10-CM

## 2025-01-13 DIAGNOSIS — I7121 Aneurysm of the ascending aorta, without rupture: Secondary | ICD-10-CM | POA: Diagnosis present

## 2025-01-13 DIAGNOSIS — F17210 Nicotine dependence, cigarettes, uncomplicated: Secondary | ICD-10-CM | POA: Diagnosis not present

## 2025-01-13 DIAGNOSIS — G9782 Other postprocedural complications and disorders of nervous system: Secondary | ICD-10-CM | POA: Diagnosis not present

## 2025-01-13 DIAGNOSIS — I350 Nonrheumatic aortic (valve) stenosis: Secondary | ICD-10-CM | POA: Diagnosis not present

## 2025-01-13 DIAGNOSIS — T8119XA Other postprocedural shock, initial encounter: Secondary | ICD-10-CM | POA: Diagnosis not present

## 2025-01-13 DIAGNOSIS — Z791 Long term (current) use of non-steroidal anti-inflammatories (NSAID): Secondary | ICD-10-CM

## 2025-01-13 DIAGNOSIS — I4891 Unspecified atrial fibrillation: Secondary | ICD-10-CM

## 2025-01-13 DIAGNOSIS — K5909 Other constipation: Secondary | ICD-10-CM | POA: Diagnosis present

## 2025-01-13 DIAGNOSIS — Z885 Allergy status to narcotic agent status: Secondary | ICD-10-CM

## 2025-01-13 DIAGNOSIS — I352 Nonrheumatic aortic (valve) stenosis with insufficiency: Secondary | ICD-10-CM

## 2025-01-13 DIAGNOSIS — Z720 Tobacco use: Secondary | ICD-10-CM | POA: Diagnosis not present

## 2025-01-13 DIAGNOSIS — R569 Unspecified convulsions: Secondary | ICD-10-CM | POA: Diagnosis not present

## 2025-01-13 DIAGNOSIS — Z8419 Family history of other disorders of kidney and ureter: Secondary | ICD-10-CM

## 2025-01-13 DIAGNOSIS — Z8249 Family history of ischemic heart disease and other diseases of the circulatory system: Secondary | ICD-10-CM

## 2025-01-13 DIAGNOSIS — G8929 Other chronic pain: Secondary | ICD-10-CM | POA: Diagnosis present

## 2025-01-13 DIAGNOSIS — I472 Ventricular tachycardia, unspecified: Secondary | ICD-10-CM | POA: Diagnosis present

## 2025-01-13 DIAGNOSIS — Z48812 Encounter for surgical aftercare following surgery on the circulatory system: Secondary | ICD-10-CM | POA: Diagnosis not present

## 2025-01-13 DIAGNOSIS — R29700 NIHSS score 0: Secondary | ICD-10-CM | POA: Diagnosis not present

## 2025-01-13 DIAGNOSIS — E782 Mixed hyperlipidemia: Secondary | ICD-10-CM

## 2025-01-13 DIAGNOSIS — N400 Enlarged prostate without lower urinary tract symptoms: Secondary | ICD-10-CM | POA: Diagnosis present

## 2025-01-13 DIAGNOSIS — R7303 Prediabetes: Secondary | ICD-10-CM | POA: Diagnosis present

## 2025-01-13 DIAGNOSIS — Y831 Surgical operation with implant of artificial internal device as the cause of abnormal reaction of the patient, or of later complication, without mention of misadventure at the time of the procedure: Secondary | ICD-10-CM | POA: Diagnosis not present

## 2025-01-13 DIAGNOSIS — I10 Essential (primary) hypertension: Secondary | ICD-10-CM | POA: Diagnosis present

## 2025-01-13 DIAGNOSIS — D696 Thrombocytopenia, unspecified: Secondary | ICD-10-CM | POA: Diagnosis present

## 2025-01-13 DIAGNOSIS — I493 Ventricular premature depolarization: Secondary | ICD-10-CM | POA: Diagnosis not present

## 2025-01-13 DIAGNOSIS — I9581 Postprocedural hypotension: Secondary | ICD-10-CM | POA: Diagnosis not present

## 2025-01-13 DIAGNOSIS — Z781 Physical restraint status: Secondary | ICD-10-CM

## 2025-01-13 DIAGNOSIS — J9811 Atelectasis: Secondary | ICD-10-CM | POA: Diagnosis not present

## 2025-01-13 DIAGNOSIS — E873 Alkalosis: Secondary | ICD-10-CM | POA: Diagnosis not present

## 2025-01-13 DIAGNOSIS — I6340 Cerebral infarction due to embolism of unspecified cerebral artery: Secondary | ICD-10-CM | POA: Diagnosis not present

## 2025-01-13 DIAGNOSIS — Z79899 Other long term (current) drug therapy: Secondary | ICD-10-CM

## 2025-01-13 DIAGNOSIS — J9601 Acute respiratory failure with hypoxia: Secondary | ICD-10-CM | POA: Diagnosis not present

## 2025-01-13 DIAGNOSIS — J90 Pleural effusion, not elsewhere classified: Secondary | ICD-10-CM | POA: Diagnosis not present

## 2025-01-13 DIAGNOSIS — I639 Cerebral infarction, unspecified: Secondary | ICD-10-CM

## 2025-01-13 DIAGNOSIS — Z7982 Long term (current) use of aspirin: Secondary | ICD-10-CM

## 2025-01-13 DIAGNOSIS — Z833 Family history of diabetes mellitus: Secondary | ICD-10-CM

## 2025-01-13 DIAGNOSIS — I97820 Postprocedural cerebrovascular infarction during cardiac surgery: Secondary | ICD-10-CM | POA: Diagnosis not present

## 2025-01-13 DIAGNOSIS — R739 Hyperglycemia, unspecified: Secondary | ICD-10-CM

## 2025-01-13 LAB — CBC
HCT: 31.4 % — ABNORMAL LOW (ref 39.0–52.0)
Hemoglobin: 10.8 g/dL — ABNORMAL LOW (ref 13.0–17.0)
MCH: 32.5 pg (ref 26.0–34.0)
MCHC: 34.4 g/dL (ref 30.0–36.0)
MCV: 94.6 fL (ref 80.0–100.0)
Platelets: 120 10*3/uL — ABNORMAL LOW (ref 150–400)
RBC: 3.32 MIL/uL — ABNORMAL LOW (ref 4.22–5.81)
RDW: 12.6 % (ref 11.5–15.5)
WBC: 17.9 10*3/uL — ABNORMAL HIGH (ref 4.0–10.5)
nRBC: 0 % (ref 0.0–0.2)

## 2025-01-13 LAB — POCT I-STAT, CHEM 8
BUN: 17 mg/dL (ref 8–23)
BUN: 19 mg/dL (ref 8–23)
BUN: 15 mg/dL (ref 8–23)
BUN: 16 mg/dL (ref 8–23)
BUN: 15 mg/dL (ref 8–23)
Calcium, Ion: 1.22 mmol/L (ref 1.15–1.40)
Calcium, Ion: 1.3 mmol/L (ref 1.15–1.40)
Calcium, Ion: 1.06 mmol/L — ABNORMAL LOW (ref 1.15–1.40)
Calcium, Ion: 1.14 mmol/L — ABNORMAL LOW (ref 1.15–1.40)
Calcium, Ion: 1.27 mmol/L (ref 1.15–1.40)
Chloride: 105 mmol/L (ref 98–111)
Chloride: 106 mmol/L (ref 98–111)
Chloride: 101 mmol/L (ref 98–111)
Chloride: 106 mmol/L (ref 98–111)
Chloride: 102 mmol/L (ref 98–111)
Creatinine, Ser: 1.1 mg/dL (ref 0.61–1.24)
Creatinine, Ser: 1.1 mg/dL (ref 0.61–1.24)
Creatinine, Ser: 1 mg/dL (ref 0.61–1.24)
Creatinine, Ser: 1.1 mg/dL (ref 0.61–1.24)
Creatinine, Ser: 1 mg/dL (ref 0.61–1.24)
Glucose, Bld: 117 mg/dL — ABNORMAL HIGH (ref 70–99)
Glucose, Bld: 129 mg/dL — ABNORMAL HIGH (ref 70–99)
Glucose, Bld: 132 mg/dL — ABNORMAL HIGH (ref 70–99)
Glucose, Bld: 135 mg/dL — ABNORMAL HIGH (ref 70–99)
Glucose, Bld: 140 mg/dL — ABNORMAL HIGH (ref 70–99)
HCT: 38 % — ABNORMAL LOW (ref 39.0–52.0)
HCT: 40 % (ref 39.0–52.0)
HCT: 32 % — ABNORMAL LOW (ref 39.0–52.0)
HCT: 30 % — ABNORMAL LOW (ref 39.0–52.0)
HCT: 29 % — ABNORMAL LOW (ref 39.0–52.0)
Hemoglobin: 12.9 g/dL — ABNORMAL LOW (ref 13.0–17.0)
Hemoglobin: 13.6 g/dL (ref 13.0–17.0)
Hemoglobin: 10.9 g/dL — ABNORMAL LOW (ref 13.0–17.0)
Hemoglobin: 10.2 g/dL — ABNORMAL LOW (ref 13.0–17.0)
Hemoglobin: 9.9 g/dL — ABNORMAL LOW (ref 13.0–17.0)
Potassium: 4.1 mmol/L (ref 3.5–5.1)
Potassium: 4.2 mmol/L (ref 3.5–5.1)
Potassium: 4.1 mmol/L (ref 3.5–5.1)
Potassium: 4.8 mmol/L (ref 3.5–5.1)
Potassium: 4.3 mmol/L (ref 3.5–5.1)
Sodium: 140 mmol/L (ref 135–145)
Sodium: 142 mmol/L (ref 135–145)
Sodium: 140 mmol/L (ref 135–145)
Sodium: 139 mmol/L (ref 135–145)
Sodium: 144 mmol/L (ref 135–145)
TCO2: 25 mmol/L (ref 22–32)
TCO2: 26 mmol/L (ref 22–32)
TCO2: 26 mmol/L (ref 22–32)
TCO2: 25 mmol/L (ref 22–32)
TCO2: 22 mmol/L (ref 22–32)

## 2025-01-13 LAB — ECHO INTRAOPERATIVE TEE
AV Mean grad: 30 mmHg
AV Peak grad: 22.6 mmHg
Ao pk vel: 2.38 m/s
P 1/2 time: 644 ms

## 2025-01-13 LAB — POCT I-STAT 7, (LYTES, BLD GAS, ICA,H+H)
Acid-base deficit: 2 mmol/L (ref 0.0–2.0)
Acid-base deficit: 1 mmol/L (ref 0.0–2.0)
Acid-base deficit: 7 mmol/L — ABNORMAL HIGH (ref 0.0–2.0)
Acid-base deficit: 2 mmol/L (ref 0.0–2.0)
Acid-base deficit: 3 mmol/L — ABNORMAL HIGH (ref 0.0–2.0)
Bicarbonate: 24.3 mmol/L (ref 20.0–28.0)
Bicarbonate: 24.3 mmol/L (ref 20.0–28.0)
Bicarbonate: 20.2 mmol/L (ref 20.0–28.0)
Bicarbonate: 22.3 mmol/L (ref 20.0–28.0)
Bicarbonate: 20.4 mmol/L (ref 20.0–28.0)
Calcium, Ion: 1.16 mmol/L (ref 1.15–1.40)
Calcium, Ion: 1.22 mmol/L (ref 1.15–1.40)
Calcium, Ion: 1.21 mmol/L (ref 1.15–1.40)
Calcium, Ion: 1.16 mmol/L (ref 1.15–1.40)
Calcium, Ion: 1.11 mmol/L — ABNORMAL LOW (ref 1.15–1.40)
HCT: 40 % (ref 39.0–52.0)
HCT: 28 % — ABNORMAL LOW (ref 39.0–52.0)
HCT: 32 % — ABNORMAL LOW (ref 39.0–52.0)
HCT: 30 % — ABNORMAL LOW (ref 39.0–52.0)
HCT: 27 % — ABNORMAL LOW (ref 39.0–52.0)
Hemoglobin: 13.6 g/dL (ref 13.0–17.0)
Hemoglobin: 9.5 g/dL — ABNORMAL LOW (ref 13.0–17.0)
Hemoglobin: 10.9 g/dL — ABNORMAL LOW (ref 13.0–17.0)
Hemoglobin: 10.2 g/dL — ABNORMAL LOW (ref 13.0–17.0)
Hemoglobin: 9.2 g/dL — ABNORMAL LOW (ref 13.0–17.0)
O2 Saturation: 100 %
O2 Saturation: 100 %
O2 Saturation: 99 %
O2 Saturation: 98 %
O2 Saturation: 97 %
Patient temperature: 36.6
Patient temperature: 36.5
Patient temperature: 36.8
Potassium: 4.2 mmol/L (ref 3.5–5.1)
Potassium: 4.4 mmol/L (ref 3.5–5.1)
Potassium: 4.2 mmol/L (ref 3.5–5.1)
Potassium: 4.5 mmol/L (ref 3.5–5.1)
Potassium: 4.1 mmol/L (ref 3.5–5.1)
Sodium: 141 mmol/L (ref 135–145)
Sodium: 139 mmol/L (ref 135–145)
Sodium: 141 mmol/L (ref 135–145)
Sodium: 142 mmol/L (ref 135–145)
Sodium: 143 mmol/L (ref 135–145)
TCO2: 26 mmol/L (ref 22–32)
TCO2: 26 mmol/L (ref 22–32)
TCO2: 22 mmol/L (ref 22–32)
TCO2: 23 mmol/L (ref 22–32)
TCO2: 21 mmol/L — ABNORMAL LOW (ref 22–32)
pCO2 arterial: 46.2 mmHg (ref 32–48)
pCO2 arterial: 44.8 mmHg (ref 32–48)
pCO2 arterial: 43.6 mmHg (ref 32–48)
pCO2 arterial: 36.2 mmHg (ref 32–48)
pCO2 arterial: 30.6 mmHg — ABNORMAL LOW (ref 32–48)
pH, Arterial: 7.33 — ABNORMAL LOW (ref 7.35–7.45)
pH, Arterial: 7.343 — ABNORMAL LOW (ref 7.35–7.45)
pH, Arterial: 7.271 — ABNORMAL LOW (ref 7.35–7.45)
pH, Arterial: 7.395 (ref 7.35–7.45)
pH, Arterial: 7.433 (ref 7.35–7.45)
pO2, Arterial: 422 mmHg — ABNORMAL HIGH (ref 83–108)
pO2, Arterial: 230 mmHg — ABNORMAL HIGH (ref 83–108)
pO2, Arterial: 141 mmHg — ABNORMAL HIGH (ref 83–108)
pO2, Arterial: 105 mmHg (ref 83–108)
pO2, Arterial: 82 mmHg — ABNORMAL LOW (ref 83–108)

## 2025-01-13 LAB — GLUCOSE, CAPILLARY
Glucose-Capillary: 124 mg/dL — ABNORMAL HIGH (ref 70–99)
Glucose-Capillary: 143 mg/dL — ABNORMAL HIGH (ref 70–99)
Glucose-Capillary: 125 mg/dL — ABNORMAL HIGH (ref 70–99)
Glucose-Capillary: 160 mg/dL — ABNORMAL HIGH (ref 70–99)

## 2025-01-13 LAB — PLATELET COUNT: Platelets: 147 10*3/uL — ABNORMAL LOW (ref 150–400)

## 2025-01-13 LAB — HEMOGLOBIN AND HEMATOCRIT, BLOOD
HCT: 33 % — ABNORMAL LOW (ref 39.0–52.0)
Hemoglobin: 11.5 g/dL — ABNORMAL LOW (ref 13.0–17.0)

## 2025-01-13 LAB — APTT: aPTT: 42 s — ABNORMAL HIGH (ref 24–36)

## 2025-01-13 LAB — PROTIME-INR
INR: 1.5 — ABNORMAL HIGH (ref 0.8–1.2)
Prothrombin Time: 19.3 s — ABNORMAL HIGH (ref 11.4–15.2)

## 2025-01-13 MED ORDER — DOCUSATE SODIUM 100 MG PO CAPS: 200.0000 mg | ORAL_CAPSULE | Freq: Every day | ORAL | Status: DC

## 2025-01-13 MED ORDER — HEPARIN SODIUM (PORCINE) 1000 UNIT/ML IJ SOLN
INTRAMUSCULAR | Status: AC
  Filled 2025-01-13: qty 10

## 2025-01-13 MED ORDER — ATORVASTATIN CALCIUM 40 MG PO TABS
40.0000 mg | ORAL_TABLET | Freq: Every evening | ORAL | Status: DC
  Filled 2025-01-13: qty 1

## 2025-01-13 MED ORDER — AMIODARONE HCL IN DEXTROSE 360-4.14 MG/200ML-% IV SOLN
30.0000 mg/h | INTRAVENOUS | Status: DC
  Administered 2025-01-13 – 2025-01-17 (×7): 30 mg/h via INTRAVENOUS
  Filled 2025-01-13 (×9): qty 200

## 2025-01-13 MED ORDER — DEXMEDETOMIDINE HCL IN NACL 400 MCG/100ML IV SOLN: 0.0000 ug/kg/h | INTRAVENOUS | Status: DC

## 2025-01-13 MED ORDER — FENTANYL CITRATE (PF) 50 MCG/ML IJ SOSY: 12.5000 ug | PREFILLED_SYRINGE | Freq: Once | INTRAMUSCULAR | Status: DC

## 2025-01-13 MED ORDER — FENTANYL CITRATE (PF) 250 MCG/5ML IJ SOLN
INTRAMUSCULAR | Status: AC
  Filled 2025-01-13: qty 5

## 2025-01-13 MED ORDER — SODIUM CHLORIDE 0.9 % IV SOLN: INTRAVENOUS | Status: DC | PRN

## 2025-01-13 MED ORDER — HEPARIN SODIUM (PORCINE) 1000 UNIT/ML IJ SOLN
INTRAMUSCULAR | Status: DC | PRN
  Administered 2025-01-13: 37000 [IU] via INTRAVENOUS

## 2025-01-13 MED ORDER — MIDAZOLAM HCL (PF) 2 MG/2ML IJ SOLN
2.0000 mg | Freq: Once | INTRAMUSCULAR | Status: DC
  Filled 2025-01-13: qty 2

## 2025-01-13 MED ORDER — SODIUM CHLORIDE 0.9 % IV SOLN
250.0000 mL | INTRAVENOUS | Status: AC
  Administered 2025-01-14: 250 mL via INTRAVENOUS

## 2025-01-13 MED ORDER — DEXTROSE 50 % IV SOLN: 0.0000 mL | INTRAVENOUS | Status: DC | PRN

## 2025-01-13 MED ORDER — ACETAMINOPHEN 160 MG/5ML PO SOLN
1000.0000 mg | Freq: Four times a day (QID) | ORAL | Status: DC
  Administered 2025-01-14 (×3): 1000 mg
  Filled 2025-01-13 (×3): qty 40.6

## 2025-01-13 MED ORDER — EPINEPHRINE 1 MG/10ML IV SOSY
PREFILLED_SYRINGE | INTRAVENOUS | Status: DC | PRN
  Administered 2025-01-13 (×2): 100 ug via INTRAVENOUS

## 2025-01-13 MED ORDER — METOPROLOL TARTRATE 12.5 MG HALF TABLET: 12.5000 mg | ORAL_TABLET | Freq: Once | ORAL | Status: DC

## 2025-01-13 MED ORDER — PROPOFOL 10 MG/ML IV BOLUS
INTRAVENOUS | Status: DC | PRN
  Administered 2025-01-13: 70 mg via INTRAVENOUS
  Administered 2025-01-13: 40 mg via INTRAVENOUS

## 2025-01-13 MED ORDER — METOPROLOL TARTRATE 12.5 MG HALF TABLET
ORAL_TABLET | ORAL | Status: AC
  Administered 2025-01-13: 12.5 mg via ORAL
  Filled 2025-01-13: qty 1

## 2025-01-13 MED ORDER — ROCURONIUM BROMIDE 10 MG/ML (PF) SYRINGE
PREFILLED_SYRINGE | INTRAVENOUS | Status: DC | PRN
  Administered 2025-01-13: 20 mg via INTRAVENOUS
  Administered 2025-01-13: 50 mg via INTRAVENOUS
  Administered 2025-01-13: 100 mg via INTRAVENOUS

## 2025-01-13 MED ORDER — PHENYLEPHRINE HCL-NACL 20-0.9 MG/250ML-% IV SOLN: 0.0000 ug/min | INTRAVENOUS | Status: DC

## 2025-01-13 MED ORDER — NALOXEGOL OXALATE 25 MG PO TABS
25.0000 mg | ORAL_TABLET | Freq: Every day | ORAL | Status: DC
  Filled 2025-01-13: qty 1

## 2025-01-13 MED ORDER — OXYCODONE HCL 5 MG PO TABS: 5.0000 mg | ORAL_TABLET | ORAL | Status: DC | PRN

## 2025-01-13 MED ORDER — ALBUMIN HUMAN 5 % IV SOLN
250.0000 mL | INTRAVENOUS | Status: AC | PRN
  Administered 2025-01-13 – 2025-01-14 (×5): 12.5 g via INTRAVENOUS
  Filled 2025-01-13 (×2): qty 250

## 2025-01-13 MED ORDER — PANTOPRAZOLE SODIUM 40 MG PO TBEC: 40.0000 mg | DELAYED_RELEASE_TABLET | Freq: Every day | ORAL | Status: DC

## 2025-01-13 MED ORDER — ORAL CARE MOUTH RINSE: 15.0000 mL | OROMUCOSAL | Status: DC | PRN

## 2025-01-13 MED ORDER — METOPROLOL TARTRATE 12.5 MG HALF TABLET
12.5000 mg | ORAL_TABLET | Freq: Two times a day (BID) | ORAL | Status: DC
  Administered 2025-01-15 – 2025-01-16 (×2): 12.5 mg via ORAL
  Filled 2025-01-13 (×2): qty 1

## 2025-01-13 MED ORDER — CHLORHEXIDINE GLUCONATE CLOTH 2 % EX PADS
6.0000 | MEDICATED_PAD | Freq: Every day | CUTANEOUS | Status: DC
  Administered 2025-01-13 – 2025-01-18 (×6): 6 via TOPICAL

## 2025-01-13 MED ORDER — EPHEDRINE 5 MG/ML INJ
INTRAVENOUS | Status: AC
  Filled 2025-01-13: qty 5

## 2025-01-13 MED ORDER — SODIUM BICARBONATE 8.4 % IV SOLN
100.0000 meq | Freq: Once | INTRAVENOUS | Status: AC
  Administered 2025-01-13: 100 meq via INTRAVENOUS

## 2025-01-13 MED ORDER — PANTOPRAZOLE SODIUM 40 MG IV SOLR
40.0000 mg | Freq: Every day | INTRAVENOUS | Status: DC
  Administered 2025-01-13: 40 mg via INTRAVENOUS
  Filled 2025-01-13: qty 10

## 2025-01-13 MED ORDER — MIDAZOLAM HCL (PF) 10 MG/2ML IJ SOLN
INTRAMUSCULAR | Status: AC
  Filled 2025-01-13: qty 2

## 2025-01-13 MED ORDER — PROTAMINE SULFATE 10 MG/ML IV SOLN
INTRAVENOUS | Status: DC | PRN
  Administered 2025-01-13: 370 mg via INTRAVENOUS

## 2025-01-13 MED ORDER — VASOPRESSIN 20 UNIT/ML IV SOLN
INTRAVENOUS | Status: DC | PRN
  Administered 2025-01-13: .5 [IU] via INTRAVENOUS

## 2025-01-13 MED ORDER — SODIUM CHLORIDE 0.9% FLUSH
3.0000 mL | Freq: Two times a day (BID) | INTRAVENOUS | Status: DC
  Administered 2025-01-14 – 2025-01-20 (×9): 3 mL via INTRAVENOUS

## 2025-01-13 MED ORDER — ALBUTEROL SULFATE (2.5 MG/3ML) 0.083% IN NEBU: 3.0000 mL | INHALATION_SOLUTION | RESPIRATORY_TRACT | Status: DC | PRN

## 2025-01-13 MED ORDER — LACTATED RINGERS IV SOLN: INTRAVENOUS | Status: AC

## 2025-01-13 MED ORDER — TRAMADOL HCL 50 MG PO TABS: 50.0000 mg | ORAL_TABLET | ORAL | Status: DC | PRN

## 2025-01-13 MED ORDER — ASPIRIN 325 MG PO TBEC
325.0000 mg | DELAYED_RELEASE_TABLET | Freq: Every day | ORAL | Status: DC
  Administered 2025-01-15 – 2025-01-16 (×2): 325 mg via ORAL
  Filled 2025-01-13 (×2): qty 1

## 2025-01-13 MED ORDER — SODIUM CHLORIDE 0.9% FLUSH: 3.0000 mL | INTRAVENOUS | Status: DC | PRN

## 2025-01-13 MED ORDER — PHENYLEPHRINE 80 MCG/ML (10ML) SYRINGE FOR IV PUSH (FOR BLOOD PRESSURE SUPPORT)
PREFILLED_SYRINGE | INTRAVENOUS | Status: DC | PRN
  Administered 2025-01-13 (×2): 160 ug via INTRAVENOUS
  Administered 2025-01-13 (×2): 80 ug via INTRAVENOUS
  Administered 2025-01-13 (×2): 160 ug via INTRAVENOUS
  Administered 2025-01-13: 80 ug via INTRAVENOUS
  Administered 2025-01-13: 160 ug via INTRAVENOUS
  Administered 2025-01-13 (×2): 80 ug via INTRAVENOUS
  Administered 2025-01-13: 160 ug via INTRAVENOUS
  Administered 2025-01-13 (×2): 80 ug via INTRAVENOUS

## 2025-01-13 MED ORDER — PROTAMINE SULFATE 10 MG/ML IV SOLN
INTRAVENOUS | Status: AC
  Filled 2025-01-13: qty 25

## 2025-01-13 MED ORDER — MIDAZOLAM HCL (PF) 2 MG/2ML IJ SOLN
2.0000 mg | INTRAMUSCULAR | Status: DC | PRN
  Administered 2025-01-13: 2 mg via INTRAVENOUS
  Filled 2025-01-13: qty 2

## 2025-01-13 MED ORDER — ACETAMINOPHEN 160 MG/5ML PO SOLN
650.0000 mg | Freq: Once | ORAL | Status: DC
  Filled 2025-01-13: qty 20.3

## 2025-01-13 MED ORDER — VASOPRESSIN 20 UNIT/ML IV SOLN
INTRAVENOUS | Status: AC
  Filled 2025-01-13: qty 1

## 2025-01-13 MED ORDER — ACETAMINOPHEN 160 MG/5ML PO SOLN: 1000.0000 mg | Freq: Four times a day (QID) | ORAL | Status: DC

## 2025-01-13 MED ORDER — ALBUMIN HUMAN 5 % IV SOLN: INTRAVENOUS | Status: DC | PRN

## 2025-01-13 MED ORDER — FENTANYL CITRATE (PF) 50 MCG/ML IJ SOSY
12.5000 ug | PREFILLED_SYRINGE | INTRAMUSCULAR | Status: DC | PRN
  Administered 2025-01-14: 25 ug via INTRAVENOUS
  Filled 2025-01-13 (×2): qty 1

## 2025-01-13 MED ORDER — ACETAMINOPHEN 500 MG PO TABS: 1000.0000 mg | ORAL_TABLET | Freq: Four times a day (QID) | ORAL | Status: DC

## 2025-01-13 MED ORDER — CHLORHEXIDINE GLUCONATE 0.12 % MT SOLN: 15.0000 mL | Freq: Once | OROMUCOSAL | Status: DC

## 2025-01-13 MED ORDER — TRAZODONE HCL 150 MG PO TABS
150.0000 mg | ORAL_TABLET | Freq: Every day | ORAL | Status: DC
  Filled 2025-01-13: qty 1

## 2025-01-13 MED ORDER — CHLORHEXIDINE GLUCONATE 0.12 % MT SOLN
OROMUCOSAL | Status: AC
  Administered 2025-01-13: 15 mL via OROMUCOSAL
  Filled 2025-01-13: qty 15

## 2025-01-13 MED ORDER — FENTANYL CITRATE (PF) 50 MCG/ML IJ SOSY
50.0000 ug | PREFILLED_SYRINGE | INTRAMUSCULAR | Status: DC | PRN
  Administered 2025-01-13: 100 ug via INTRAVENOUS
  Filled 2025-01-13: qty 2
  Filled 2025-01-13: qty 1

## 2025-01-13 MED ORDER — INSULIN REGULAR(HUMAN) IN NACL 100-0.9 UT/100ML-% IV SOLN
INTRAVENOUS | Status: DC
  Administered 2025-01-14: 4.4 [IU]/h via INTRAVENOUS
  Filled 2025-01-13: qty 100

## 2025-01-13 MED ORDER — PHENYLEPHRINE 80 MCG/ML (10ML) SYRINGE FOR IV PUSH (FOR BLOOD PRESSURE SUPPORT)
PREFILLED_SYRINGE | INTRAVENOUS | Status: AC
  Filled 2025-01-13: qty 10

## 2025-01-13 MED ORDER — ONDANSETRON HCL 4 MG/2ML IJ SOLN: 4.0000 mg | Freq: Four times a day (QID) | INTRAMUSCULAR | Status: DC | PRN

## 2025-01-13 MED ORDER — ONDANSETRON HCL 4 MG/2ML IJ SOLN
INTRAMUSCULAR | Status: DC | PRN
  Administered 2025-01-13: 4 mg via INTRAVENOUS

## 2025-01-13 MED ORDER — ASPIRIN 81 MG PO CHEW
324.0000 mg | CHEWABLE_TABLET | Freq: Once | ORAL | Status: DC
  Filled 2025-01-13: qty 4

## 2025-01-13 MED ORDER — CHLORHEXIDINE GLUCONATE 4 % EX SOLN: 30.0000 mL | CUTANEOUS | Status: DC

## 2025-01-13 MED ORDER — LACTATED RINGERS IV SOLN: INTRAVENOUS | Status: DC | PRN

## 2025-01-13 MED ORDER — SODIUM CHLORIDE (PF) 0.9 % IJ SOLN: OROMUCOSAL | Status: DC | PRN

## 2025-01-13 MED ORDER — ASPIRIN 81 MG PO CHEW
324.0000 mg | CHEWABLE_TABLET | Freq: Every day | ORAL | Status: DC
  Administered 2025-01-14: 324 mg
  Filled 2025-01-13: qty 4

## 2025-01-13 MED ORDER — ACETAMINOPHEN 160 MG/5ML PO SOLN: 650.0000 mg | Freq: Once | ORAL | Status: DC

## 2025-01-13 MED ORDER — AMIODARONE HCL IN DEXTROSE 360-4.14 MG/200ML-% IV SOLN
60.0000 mg/h | INTRAVENOUS | Status: AC
  Administered 2025-01-13 (×3): 60 mg/h via INTRAVENOUS

## 2025-01-13 MED ORDER — BISACODYL 5 MG PO TBEC
10.0000 mg | DELAYED_RELEASE_TABLET | Freq: Every day | ORAL | Status: DC
  Administered 2025-01-15 – 2025-01-22 (×3): 10 mg via ORAL
  Filled 2025-01-13 (×6): qty 2

## 2025-01-13 MED ORDER — SODIUM CHLORIDE 0.9 % IV SOLN: INTRAVENOUS | Status: AC

## 2025-01-13 MED ORDER — BISACODYL 10 MG RE SUPP
10.0000 mg | Freq: Every day | RECTAL | Status: DC
  Filled 2025-01-13: qty 1

## 2025-01-13 MED ORDER — SODIUM CHLORIDE 0.45 % IV SOLN: INTRAVENOUS | Status: AC | PRN

## 2025-01-13 MED ORDER — PHENYLEPHRINE 80 MCG/ML (10ML) SYRINGE FOR IV PUSH (FOR BLOOD PRESSURE SUPPORT)
PREFILLED_SYRINGE | INTRAVENOUS | Status: AC
  Filled 2025-01-13: qty 20

## 2025-01-13 MED ORDER — METOPROLOL TARTRATE 5 MG/5ML IV SOLN: 2.5000 mg | INTRAVENOUS | Status: DC | PRN

## 2025-01-13 MED ORDER — MAGNESIUM SULFATE 4 GM/100ML IV SOLN
4.0000 g | Freq: Once | INTRAVENOUS | Status: AC
  Administered 2025-01-13: 4 g via INTRAVENOUS
  Filled 2025-01-13: qty 100

## 2025-01-13 MED ORDER — CEFAZOLIN SODIUM-DEXTROSE 2-4 GM/100ML-% IV SOLN
2.0000 g | Freq: Three times a day (TID) | INTRAVENOUS | Status: AC
  Administered 2025-01-13 – 2025-01-15 (×6): 2 g via INTRAVENOUS
  Filled 2025-01-13 (×6): qty 100

## 2025-01-13 MED ORDER — SUGAMMADEX SODIUM 200 MG/2ML IV SOLN
INTRAVENOUS | Status: DC | PRN
  Administered 2025-01-13: 300 mg via INTRAVENOUS

## 2025-01-13 MED ORDER — HEPARIN SODIUM (PORCINE) 1000 UNIT/ML IJ SOLN
INTRAMUSCULAR | Status: AC
  Filled 2025-01-13: qty 1

## 2025-01-13 MED ORDER — POTASSIUM CHLORIDE 10 MEQ/50ML IV SOLN: 10.0000 meq | INTRAVENOUS | Status: AC

## 2025-01-13 MED ORDER — LIDOCAINE 2% (20 MG/ML) 5 ML SYRINGE
INTRAMUSCULAR | Status: AC
  Filled 2025-01-13: qty 5

## 2025-01-13 MED ORDER — METOPROLOL TARTRATE 25 MG/10 ML ORAL SUSPENSION: 12.5000 mg | Freq: Two times a day (BID) | ORAL | Status: DC

## 2025-01-13 MED ORDER — METOCLOPRAMIDE HCL 5 MG/ML IJ SOLN
10.0000 mg | Freq: Four times a day (QID) | INTRAMUSCULAR | Status: AC
  Administered 2025-01-13 – 2025-01-14 (×5): 10 mg via INTRAVENOUS
  Filled 2025-01-13 (×5): qty 2

## 2025-01-13 MED ORDER — EPINEPHRINE 1 MG/10ML IV SOSY
PREFILLED_SYRINGE | INTRAVENOUS | Status: AC
  Filled 2025-01-13: qty 10

## 2025-01-13 MED ORDER — PROPOFOL 10 MG/ML IV BOLUS
INTRAVENOUS | Status: AC
  Filled 2025-01-13: qty 20

## 2025-01-13 MED ORDER — MIDAZOLAM HCL 2 MG/2ML IJ SOLN
INTRAMUSCULAR | Status: AC
  Administered 2025-01-13: 2 mg via INTRAVENOUS
  Filled 2025-01-13: qty 2

## 2025-01-13 MED ORDER — ONDANSETRON HCL 4 MG/2ML IJ SOLN
INTRAMUSCULAR | Status: AC
  Filled 2025-01-13: qty 2

## 2025-01-13 MED ORDER — CHLORHEXIDINE GLUCONATE 0.12 % MT SOLN
15.0000 mL | OROMUCOSAL | Status: AC
  Filled 2025-01-13: qty 15

## 2025-01-13 MED ORDER — ~~LOC~~ CARDIAC SURGERY, PATIENT & FAMILY EDUCATION: Freq: Once | Status: AC

## 2025-01-13 MED ORDER — FENTANYL CITRATE (PF) 250 MCG/5ML IJ SOLN
INTRAMUSCULAR | Status: DC | PRN
  Administered 2025-01-13 (×2): 100 ug via INTRAVENOUS
  Administered 2025-01-13: 50 ug via INTRAVENOUS
  Administered 2025-01-13 (×2): 100 ug via INTRAVENOUS
  Administered 2025-01-13: 150 ug via INTRAVENOUS
  Administered 2025-01-13 (×2): 100 ug via INTRAVENOUS
  Administered 2025-01-13: 200 ug via INTRAVENOUS

## 2025-01-13 MED ORDER — PROTAMINE SULFATE 10 MG/ML IV SOLN
INTRAVENOUS | Status: AC
  Filled 2025-01-13: qty 5

## 2025-01-13 MED ORDER — 0.9 % SODIUM CHLORIDE (POUR BTL) OPTIME
TOPICAL | Status: DC | PRN
  Administered 2025-01-13: 5000 mL

## 2025-01-13 MED ORDER — PLASMA-LYTE A IV SOLN: INTRAVENOUS | Status: DC | PRN

## 2025-01-13 MED ORDER — ROCURONIUM BROMIDE 10 MG/ML (PF) SYRINGE
PREFILLED_SYRINGE | INTRAVENOUS | Status: AC
  Filled 2025-01-13: qty 10

## 2025-01-13 MED ORDER — ACETAMINOPHEN 500 MG PO TABS
1000.0000 mg | ORAL_TABLET | Freq: Four times a day (QID) | ORAL | Status: AC
  Administered 2025-01-15 – 2025-01-18 (×14): 1000 mg via ORAL
  Filled 2025-01-13 (×14): qty 2

## 2025-01-13 MED ORDER — VANCOMYCIN HCL IN DEXTROSE 1-5 GM/200ML-% IV SOLN
1000.0000 mg | Freq: Once | INTRAVENOUS | Status: AC
  Administered 2025-01-13: 1000 mg via INTRAVENOUS
  Filled 2025-01-13: qty 200

## 2025-01-13 MED ORDER — FENTANYL CITRATE (PF) 50 MCG/ML IJ SOSY
12.5000 ug | PREFILLED_SYRINGE | INTRAMUSCULAR | Status: DC | PRN
  Administered 2025-01-13: 12.5 ug via INTRAVENOUS

## 2025-01-13 MED ORDER — MIDAZOLAM HCL (PF) 5 MG/ML IJ SOLN
INTRAMUSCULAR | Status: DC | PRN
  Administered 2025-01-13: 3 mg via INTRAVENOUS
  Administered 2025-01-13 (×2): 2 mg via INTRAVENOUS

## 2025-01-13 NOTE — Anesthesia Postprocedure Evaluation (Signed)
"   Anesthesia Post Note  Patient: [name]  Procedure(s) Performed: REPLACEMENT, AORTIC VALVE, OPEN UTILIZING 25 MM  INSPIRIS RESILIA  AORTIC VALVE (Chest) ECHOCARDIOGRAM, TRANSESOPHAGEAL, INTRAOPERATIVE     Patient location during evaluation: SICU Anesthesia Type: General Level of consciousness: sedated and patient remains intubated per anesthesia plan Pain management: pain level controlled Vital Signs Assessment: post-procedure vital signs reviewed and stable Respiratory status: patient remains intubated per anesthesia plan Cardiovascular status: stable Anesthetic complications: no   No notable events documented.  Last Vitals:  Vitals:   01/13/25 0825  BP: (!) 155/83  Pulse: 81  Resp: (!) 22  Temp: 37.2 C  SpO2: 99%    Last Pain:  Vitals:   01/13/25 0852  TempSrc:   PainSc: 0-No pain                 Norleen Pope      "

## 2025-01-13 NOTE — Op Note (Signed)
 "    [address] [place] [address]             [id number]                                        01/13/2025 Patient:  Anthony Paul Pre-Op Dx: severe aortic stenosis Moderate aortic insufficiency   Post-op Dx:  same Procedure: Aortic valve replacement with a 25mm Inspiris Resilia valve     Surgeon and Role:      * Danitra Payano, Linnie KIDD, MD - Primary    * E. Barrett, PA-C  An experienced assistant was required given the complexity of this surgery and the standard of surgical care. The assistant was needed for exposure, dissection, suctioning, retraction of delicate tissues and sutures, instrument exchange and for overall help during this procedure.    Anesthesia  general EBL:  Blood Administration: none Xclamp Time:  70 min Pump Time:   Drains: 19 F blake drain: R, L, mediastinal  Wires: V Counts: correct   Indications: 69 y.o. male with severe aortic stenosis, and moderated AI.  No evidence of coronary disease.  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  His left main ostium is low at 8.4cm, thus he would not be a good candidate for TAVR.  We discussed the risks and benefits of a aortic valve replacement with a tissue valve.  I do not think it 4.3 cm anything will be needed for his aneurysm.  He would like to proceed with surgery.  We have agree to use a bioprosthetic aortic valve.    Findings: Tricuspid valve with fusion at the right and left commissure.    Operative Technique: All invasive lines were placed in pre-op holding.  After the risks, benefits and alternatives were thoroughly discussed, the patient was brought to the operative theatre.  Anesthesia was induced, and the patient was prepped and draped in normal sterile fashion.  An appropriate surgical pause was performed, and pre-operative antibiotics were dosed accordingly.  We began with an incision over the chest for the sternotomy.  This was carried down with bovie  cautery, and the sternum was divided with a reciprocating saw.  Meticulous hemostasis was obtained.  The patient was systemically heparinized.   The sternal retractor was placed.  The pericardium was divided in the midline and fashioned into a cradle with pericardial stitches.   After we confirmed an appropriate ACT, the ascending aorta was cannulated in standard fashion.  The right atrial appendage was used for venous cannulation site.  Cardiopulmonary bypass was initiated and we began to cool the patient to 32 degrees. The cross clamp was applied, and a dose of anterograde cardioplegia was given with good arrest of the heart.  Our aortotomy was made and directed toward the non coronary cusp.  The valve was inspected.  All leaflets were excised.  The annulus was sized to a 25mm Inspiris valve.  The left ventricle was then copiously irrigated.  Pledgeted mattress sutures were placed circumferentially through the annulus.  These sutures were then passed through the sewing ring of the valve.  Once the valve was seated in the annulus, it was secured with Core-knot sutures.  We began to rewarm, and close our aortotomy in 2 layers.  A re-animation dose of cardioplegia was given.  After de-airing the heart, the aortic  cross clamp was removed.  We checked our valve function, and for air using the TEE.  Once we were satisfying, we separated from cardiopulmonary bypass without event.    The heparin  was reversed with protamine , and hemostasis was obtained.  Chest tubes and wires were placed, and the sternum was re-approximated with with sternal wires.  The soft tissue and skin were re-approximated wth absorbable suture.    The patient tolerated the procedure without any immediate complications, and was transferred to the ICU in guarded condition.  Lavalle Skoda O Shelle Galdamez  "

## 2025-01-13 NOTE — Consult Note (Signed)
 NEUROLOGY CONSULT NOTE   Date of service: January 13, 2025 Patient Name: Anthony Paul MRN:  [id number] DOB:  20-Jan-1956 Chief Complaint: L sided weakness s/p AVR Requesting Provider: Shyrl Linnie KIDD, MD  History of Present Illness  Anthony Paul is a 69 y.o. male with hx of preDM2, tobacco use, COPD, severe AS s/p AVR who is currently admitted to the cardiovascular ICU post op. During surgery, air was found in the aortic line and patient was placed in trendelenburg position and air cleared.  Post op in the CVICU, noted to have some weakness in his L hand and L foot and therefore neurology was consulted for further evaluation.  On my evaluation, patient is drowsy and somnolent. He is unable to provide any history. He is able to follow simple commands but only after a lot of encouragement/coaxing.  LKW: prior to surgery Modified rankin score: 0-Completely asymptomatic and back to baseline post- stroke IV Thrombolysis: not offered, outside window EVT: not offered, exam non focal, low suspicion for LVO.  NIHSS components Score: Comment  1a Level of Conscious 0[]  1[]  2[x]  3[]      1b LOC Questions 0[]  1[]  2[x]       1c LOC Commands 0[x]  1[]  2[]       2 Best Gaze 0[x]  1[]  2[]       3 Visual 0[x]  1[]  2[]  3[]      4 Facial Palsy 0[x]  1[]  2[]  3[]      5a Motor Arm - left 0[]  1[]  2[x]  3[]  4[]  UN[]    5b Motor Arm - Right 0[]  1[]  2[x]  3[]  4[]  UN[]    6a Motor Leg - Left 0[]  1[]  2[x]  3[]  4[]  UN[]    6b Motor Leg - Right 0[]  1[]  2[x]  3[]  4[]  UN[]    7 Limb Ataxia 0[x]  1[]  2[]  UN[]      8 Sensory 0[x]  1[]  2[]  UN[]      9 Best Language 0[]  1[x]  2[]  3[]      10 Dysarthria 0[]  1[x]  2[]  UN[]      11 Extinct. and Inattention 0[x]  1[]  2[]       TOTAL: 12      ROS  Unable to ascertain due to somnolence/drowsiness.  Past History   Past Medical History:  Diagnosis Date   Abnormal CT scan, chest    Blebs on CT chest but NORMAL PFTs- no COPD   Aortic stenosis    severe 2025--->pt to get tissue valve  replacement 2026 (Dr. Kerrin)   Ascending aorta dilatation    needs annual CTA (Dr. Delford)   Bicuspid aortic valve    Echo 2014  EF 55-60% Mild to moderate AR mild AS with mean gradient 18 mmHg and peak of 25 mmHg. ? Bicuspid valve with dilated aortic root 4.0 cm.   BPH with obstruction/lower urinary tract symptoms    Chronic abdominal pain    Atypical features/unknown etiology.  Followed by [facility] pain clinic.   COPD (chronic obstructive pulmonary disease) (HCC)    diverticulosis    hx diverticulitis and partial colon resection   DJD (degenerative joint disease)    ED (erectile dysfunction)    Elevated blood pressure reading without diagnosis of hypertension    Heart murmur    Hx of colonic polyp 2015   Insomnia    Lumbar spondylosis    Mixed hyperlipidemia    Prediabetes    Tobacco abuse    Vitamin B12 deficiency     Past Surgical History:  Procedure Laterality Date   CARDIAC CATHETERIZATION     09/2024 no  signif CAD.   Carotid dopplers     03/2024 normal   COLON SURGERY  12/2010   For severe diverticulitis, 8 inches of colon was resected per patient report   COLONOSCOPY  01/13/2014   hx multiple adenomas.  Most recent 01/14/20->1 polyp->needs q5 yr colonoscopy (Dr. Leigh)   HEMORROIDECTOMY     INGUINAL HERNIA REPAIR Right 2015   Dr. Camellia Blush   RESECTION DISTAL CLAVICAL Right    RIGHT HEART CATH AND CORONARY ANGIOGRAPHY N/A 09/20/2024   Procedure: RIGHT HEART CATH AND CORONARY ANGIOGRAPHY;  Surgeon: Wonda Sharper, MD;  Location: [facility] INVASIVE CV LAB;  Service: Cardiovascular;  Laterality: N/A;   TONSILLECTOMY     TRANSTHORACIC ECHOCARDIOGRAM     03/2024, AS a bit worse (mod-to-severe), known bicuspid valve moderate AR.  08/2024 AS severe, aortic dilatation to 44 mm, grd II DD, otherwise normal.   UPPER GASTROINTESTINAL ENDOSCOPY     UPPER GI ENDOSCOPY  2015   Dr. Luis    Family History: Family History  Problem Relation Age of Onset   Heart disease  Father    Benign prostatic hyperplasia Father    Diabetes Mother    Kidney disease Sister    Cancer Sister        in Spine   Colon cancer Neg Hx    Esophageal cancer Neg Hx    Stomach cancer Neg Hx    Rectal cancer Neg Hx     Social History  reports that he has been smoking cigarettes. He started smoking about 53 years ago. He has a 78.6 pack-year smoking history. He quit smokeless tobacco use about 18 years ago.  His smokeless tobacco use included chew. He reports current alcohol use of about 5.0 standard drinks of alcohol per week. He reports that he does not use drugs.  Allergies[1]  Medications  Current Medications[2]  Vitals   Vitals:   01/13/25 2145 01/13/25 2200 01/13/25 2215 01/13/25 2230  BP:      Pulse: 79 77 76 79  Resp:      Temp: 99.7 F (37.6 C) 99.9 F (37.7 C)    TempSrc:      SpO2: 96% 97% 98% 95%  Weight:      Height:        Body mass index is 30.61 kg/m.   Physical Exam   General: Laying comfortably in bed; in no acute distress.  HENT: Normal oropharynx and mucosa. Normal external appearance of ears and nose.  Neck: Supple, no pain or tenderness  CV: No JVD. No peripheral edema.  Pulmonary: Symmetric Chest rise. Normal respiratory effort.  Abdomen: Soft to touch, non-tender.  Ext: No cyanosis, edema, or deformity  Skin: No rash. Normal palpation of skin.   Musculoskeletal: Normal digits and nails by inspection. No clubbing.   Neurologic Examination  Mental status/Cognition: somnolent, eyes closed. Only opens eyes with a lot of encouragement/coaxing. Immediately falls back to sleep without stimulation. Oriented to self, unable to answer any further orientation questions. Speech/language: dysarthric. Sporadic. Able to count fingers held in front of her. Cranial nerves:   CN II Pupils equal and reactive to light, unable to evaluate for VF deficits effectively.   CN III,IV,VI No gaze preference or nystagmus noted.   CN V normal sensation in V1,  V2, and V3 segments bilaterally   CN VII no asymmetry, no nasolabial fold flattening   CN VIII normal hearing to speech   CN IX & X normal palatal elevation, no uvular deviation  CN XI    CN XII midline tongue protrusion    Motor:  Muscle bulk: normal, tone normal. Difficult to evaluate full strength 2/2 somnolence. 4/5 hand grip on the right, 3/5 hand grip on the left. Able to barely lift BL lower ext off the bed with a lot of encouragement/coaxing.  Sensation:  Light touch    Pin prick Localizes to proximal pinch in all extremities.   Temperature    Vibration   Proprioception    Coordination/Complex Motor:  - Finger to Nose intact BL - Heel to shin unable to assess due to somnolence. - Rapid alternating movement unable to assess - Gait: deferred.  Labs/Imaging/Neurodiagnostic studies   CBC:  Recent Labs  Lab January 31, 2025 1200 01/13/25 1041 01/13/25 1243 01/13/25 1244 01/13/25 1500 01/13/25 1502 01/13/25 1713 01/13/25 1826  WBC 7.0  --   --   --  17.9*  --   --   --   HGB 14.8   < > 11.5*   < > 10.8*   < > 10.2* 9.2*  HCT 42.8   < > 33.0*   < > 31.4*   < > 30.0* 27.0*  MCV 92.6  --   --   --  94.6  --   --   --   PLT 188  --  147*  --  120*  --   --   --    < > = values in this interval not displayed.   Basic Metabolic Panel:  Lab Results  Component Value Date   NA 143 01/13/2025   K 4.1 01/13/2025   CO2 26 01-31-2025   GLUCOSE 140 (H) 01/13/2025   BUN 15 01/13/2025   CREATININE 1.00 01/13/2025   CALCIUM  9.4 31-Jan-2025   GFRNONAA >60 2025/01/31   GFRAA 78 06/09/2021   Lipid Panel:  Lab Results  Component Value Date   LDLCALC 59 11/20/2024   HgbA1c:  Lab Results  Component Value Date   HGBA1C 5.8 (H) 01/31/2025   Urine Drug Screen: No results found for: LABOPIA, COCAINSCRNUR, LABBENZ, AMPHETMU, THCU, LABBARB  Alcohol Level No results found for: [facility] INR  Lab Results  Component Value Date   INR 1.5 (H) 01/13/2025   APTT  Lab  Results  Component Value Date   APTT 42 (H) 01/13/2025   AED levels: No results found for: PHENYTOIN, ZONISAMIDE, LAMOTRIGINE, LEVETIRACETA  CT Head without contrast(Personally reviewed): CTH was negative for a large hypodensity concerning for a large territory infarct or hyperdensity concerning for an ICH  MRI Brain(Personally reviewed): pending ASSESSMENT   Anthony Paul is a 69 y.o. male with stroke risk factors including hx of preDM2, tobacco use, COPD, severe AS s/p AVR who is currently admitted to the cardiovascular ICU post op. During surgery, air was found in the aortic line and patient was placed in trendelenburg position and air cleared.  Post op in the CVICU, noted to have some weakness in his L hand and L foot and therefore neurology was consulted for further evaluation.  Unfortunately, hard to be sure if he has unilteral/focal deficits with poor participation with exam and somnolence. Will get MRI Brain to clarify.  RECOMMENDATIONS  - MRI Brain w/o contrast. ______________________________________________________________________    Signed, Joylynn Defrancesco, MD [facility]     [1]  Allergies Allergen Reactions   Morphine Itching    Can take hydrocodone without issues  [2]  Current Facility-Administered Medications:    0.45 % sodium chloride  infusion, , Intravenous, Continuous  PRN, Barrett, Erin R, PA-C   [START ON 01/14/2025] 0.9 %  sodium chloride  infusion, 250 mL, Intravenous, Continuous, Barrett, Erin R, PA-C   0.9 %  sodium chloride  infusion, , Intravenous, Continuous, Barrett, Erin R, PA-C   [START ON 01/14/2025] acetaminophen  (TYLENOL ) tablet 1,000 mg, 1,000 mg, Oral, Q6H **OR** [START ON 01/14/2025] acetaminophen  (TYLENOL ) 160 MG/5ML solution 1,000 mg, 1,000 mg, Per Tube, Q6H, Lightfoot, Harrell O, MD   acetaminophen  (TYLENOL ) 160 MG/5ML solution 650 mg, 650 mg, Oral, Once, Lightfoot, Linnie KIDD, MD   albumin  human 5 % solution 12.5 g, 250  mL, Intravenous, Q15 min PRN, Barrett, Erin R, PA-C, Last Rate: 999 mL/hr at 01/13/25 2200, Infusion Verify at 01/13/25 2200   albuterol  (PROVENTIL ) (2.5 MG/3ML) 0.083% nebulizer solution 3 mL, 3 mL, Inhalation, Q4H PRN, Barrett, Erin R, PA-C   amiodarone  (NEXTERONE  PREMIX) 360-4.14 MG/200ML-% (1.8 mg/mL) IV infusion, 30 mg/hr, Intravenous, Continuous, Lightfoot, Harrell O, MD, Last Rate: 16.67 mL/hr at 01/13/25 2144, 30 mg/hr at 01/13/25 2144   aspirin  chewable tablet 324 mg, 324 mg, Oral, Once, Barrett, Erin R, PA-C   [START ON 01/14/2025] aspirin  EC tablet 325 mg, 325 mg, Oral, Daily **OR** [START ON 01/14/2025] aspirin  chewable tablet 324 mg, 324 mg, Per Tube, Daily, Barrett, Erin R, PA-C   atorvastatin  (LIPITOR) tablet 40 mg, 40 mg, Oral, QPM, Barrett, Erin R, PA-C   [START ON 01/14/2025] bisacodyl  (DULCOLAX) EC tablet 10 mg, 10 mg, Oral, Daily **OR** [START ON 01/14/2025] bisacodyl  (DULCOLAX) suppository 10 mg, 10 mg, Rectal, Daily, Barrett, Erin R, PA-C   ceFAZolin  (ANCEF ) IVPB 2g/100 mL premix, 2 g, Intravenous, Q8H, Barrett, Erin R, PA-C, Stopped at 01/13/25 1555   chlorhexidine  (HIBICLENS ) 4 % liquid 2 Application, 30 mL, Topical, UD, Lightfoot, Harrell O, MD   [START ON 01/14/2025] chlorhexidine  (PERIDEX ) 0.12 % solution 15 mL, 15 mL, Mouth/Throat, Once, Lightfoot, Harrell O, MD   Chlorhexidine  Gluconate Cloth 2 % PADS 6 each, 6 each, Topical, Daily, Lightfoot, Linnie KIDD, MD, 6 each at 01/13/25 1833   dextrose  50 % solution 0-50 mL, 0-50 mL, Intravenous, PRN, Barrett, Erin R, PA-C   [START ON 01/14/2025] docusate sodium  (COLACE) capsule 200 mg, 200 mg, Oral, Daily, Barrett, Erin R, PA-C   fentaNYL  (SUBLIMAZE ) injection 12.5-25 mcg, 12.5-25 mcg, Intravenous, Q4H PRN, Smith, Joshua C, NP   insulin  regular, human (MYXREDLIN ) 100 units/ 100 mL infusion, , Intravenous, Continuous, Barrett, Erin R, PA-C, Last Rate: 0.6 mL/hr at 01/13/25 2200, 0.6 Units/hr at 01/13/25 2200   lactated ringers  infusion, ,  Intravenous, Continuous, Barrett, Erin R, PA-C   lactated ringers  infusion, , Intravenous, Continuous, Barrett, Erin R, PA-C, Stopped at 01/13/25 2139   metoCLOPramide  (REGLAN ) injection 10 mg, 10 mg, Intravenous, Q6H, Barrett, Erin R, PA-C, 10 mg at 01/13/25 1603   metoprolol  tartrate (LOPRESSOR ) tablet 12.5 mg, 12.5 mg, Oral, BID, 12.5 mg at 01/13/25 0900 **OR** metoprolol  tartrate (LOPRESSOR ) 25 mg/10 mL oral suspension 12.5 mg, 12.5 mg, Per Tube, BID, Barrett, Erin R, PA-C   metoprolol  tartrate (LOPRESSOR ) injection 2.5-5 mg, 2.5-5 mg, Intravenous, Q2H PRN, Barrett, Erin R, PA-C   metoprolol  tartrate (LOPRESSOR ) tablet 12.5 mg, 12.5 mg, Oral, Once, Lightfoot, Harrell O, MD   midazolam  PF (VERSED ) injection 2 mg, 2 mg, Intravenous, Q1H PRN, Barrett, Erin R, PA-C, 2 mg at 01/13/25 1539   midazolam  PF (VERSED ) injection 2 mg, 2 mg, Intravenous, Once, Claudene Fonda BROCKS, NP   [START ON 01/14/2025] naloxegol  oxalate (MOVANTIK ) tablet 25 mg, 25 mg, Oral, Daily,  Barrett, Erin R, PA-C   ondansetron  (ZOFRAN ) injection 4 mg, 4 mg, Intravenous, Q6H PRN, Barrett, Erin R, PA-C   Oral care mouth rinse, 15 mL, Mouth Rinse, PRN, Lightfoot, Harrell O, MD   oxyCODONE  (Oxy IR/ROXICODONE ) immediate release tablet 5-10 mg, 5-10 mg, Oral, Q3H PRN, Barrett, Erin R, PA-C   [START ON 01/15/2025] pantoprazole  (PROTONIX ) EC tablet 40 mg, 40 mg, Oral, Daily, Barrett, Erin R, PA-C   pantoprazole  (PROTONIX ) injection 40 mg, 40 mg, Intravenous, QHS, Barrett, Erin R, PA-C   phenylephrine  (NEO-SYNEPHRINE) 20mg /NS 250mL premix infusion, 0-400 mcg/min, Intravenous, Titrated, Claudene Fonda BROCKS, NP, Stopped at 01/13/25 2147   [START ON 01/14/2025] sodium chloride  flush (NS) 0.9 % injection 3 mL, 3 mL, Intravenous, Q12H, Barrett, Erin R, PA-C   [START ON 01/14/2025] sodium chloride  flush (NS) 0.9 % injection 3 mL, 3 mL, Intravenous, PRN, Barrett, Erin R, PA-C   traMADol  (ULTRAM ) tablet 50-100 mg, 50-100 mg, Oral, Q4H PRN, Barrett, Erin R,  PA-C   traZODone  (DESYREL ) tablet 150 mg, 150 mg, Oral, QHS, Barrett, Erin R, PA-C

## 2025-01-13 NOTE — Addendum Note (Signed)
 Addendum  created 01/13/25 1658 by Darlyn Rush, MD   Child order released for a procedure order, Clinical Note Signed, Intraprocedure Blocks edited, LDA created via procedure documentation, SmartForm saved

## 2025-01-13 NOTE — Anesthesia Procedure Notes (Signed)
 Central Venous Catheter Insertion Performed by: Darlyn Rush, MD, anesthesiologist Start/End1/26/2026 9:50 AM, 01/13/2025 10:05 AM Patient location: Pre-op. Preanesthetic checklist: patient identified, IV checked, site marked, risks and benefits discussed, surgical consent, monitors and equipment checked, pre-op evaluation, timeout performed and anesthesia consent Position: Trendelenburg Lidocaine  1% used for infiltration and patient sedated Hand hygiene performed  and maximum sterile barriers used  Catheter size: 8.5 Fr Sheath introducer Procedure performed using ultrasound to evaluate access site. Ultrasound Notes:relevant anatomy identified, ultrasound used to visualize needle entry, vessel patent under ultrasound and image(s) printed for medical record. Attempts: 1 Following insertion, line sutured, dressing applied and Biopatch. Post procedure assessment: blood return through all ports, free fluid flow and no air  Patient tolerated the procedure well with no immediate complications.

## 2025-01-13 NOTE — Progress Notes (Signed)
 PCCM interval progress note:  Pt noted to be weaker in the L hand and foot, he was awake enough to answer questions and follow commands, though still falling back asleep when not in conversation. He denied parasthesias, able to grossly move the LUE and LLE and no facial droop, but not able to squeeze with the L and or wiggle toes on the L.  Notified Dr. Kerrin with surgery, will proceed with a stat head CT and have requested a neurology consult.  Pt's daughter Warren updated.    Anthony Paul Angelia Hazell, PA-C

## 2025-01-13 NOTE — Plan of Care (Signed)
  Problem: Clinical Measurements: Goal: Will remain free from infection Outcome: Progressing Goal: Diagnostic test results will improve Outcome: Progressing Goal: Respiratory complications will improve Outcome: Progressing Goal: Cardiovascular complication will be avoided Outcome: Progressing   Problem: Coping: Goal: Level of anxiety will decrease Outcome: Progressing   Problem: Safety: Goal: Ability to remain free from injury will improve Outcome: Progressing

## 2025-01-13 NOTE — Procedures (Signed)
 Extubation Procedure Note  Patient Details:   Name: Anthony Paul DOB: 1956/08/27 MRN: [id number]   Airway Documentation:    Vent end date: 01/13/25 Vent end time: 1718   Evaluation  O2 sats: stable throughout Complications: No apparent complications Patient did tolerate procedure well. Bilateral Breath Sounds: Rhonchi, Diminished   Yes  Pt extubated per physician order without complication. Pt suctioned via ETT/orally prior and positive cuff leak heard. Upon extubation, pt placed on 4L nasal cannula. Pt able to speak name, give a good cough and no stridor heard at this time.   Geofm FORBES Batty 01/13/2025, 5:23 PM

## 2025-01-13 NOTE — Anesthesia Procedure Notes (Addendum)
 Arterial Line Insertion Start/End1/26/2026 9:40 AM, 01/13/2025 9:50 AM Performed by: Darlyn Rush, MD, Delores Dus, CRNA, CRNA  Patient location: Pre-op. Preanesthetic checklist: patient identified, IV checked, site marked, risks and benefits discussed, surgical consent, monitors and equipment checked, pre-op evaluation, timeout performed and anesthesia consent Lidocaine  1% used for infiltration Left, radial was placed Catheter size: 20 G Hand hygiene performed  and maximum sterile barriers used   Attempts: 1 Procedure performed using ultrasound to evaluate access site. Ultrasound Notes:relevant anatomy identified, ultrasound used to visualize needle entry and vessel patent under ultrasound. Following insertion, dressing applied. Post procedure assessment: normal and unchanged  Patient tolerated the procedure well with no immediate complications. Additional procedure comments: Unable to thread x 2, Dr. Darlyn placed cephalad  to initial attempt.

## 2025-01-13 NOTE — Interval H&P Note (Signed)
 History and Physical Interval Note:  01/13/2025 9:55 AM  Anthony Paul  has presented today for surgery, with the diagnosis of AORTIC STENOSIS.  The various methods of treatment have been discussed with the patient and family. After consideration of risks, benefits and other options for treatment, the patient has consented to  Procedures: REPLACEMENT, AORTIC VALVE, OPEN (N/A) ECHOCARDIOGRAM, TRANSESOPHAGEAL, INTRAOPERATIVE (N/A) as a surgical intervention.  The patient's history has been reviewed, patient examined, no change in status, stable for surgery.  I have reviewed the patient's chart and labs.  Questions were answered to the patient's satisfaction.     Shantel Wesely MALVA Rayas

## 2025-01-13 NOTE — Brief Op Note (Signed)
 01/13/2025 10:00 AM  7:20 AM  PATIENT:  Anthony Paul  69 y.o. male  PRE-OPERATIVE DIAGNOSIS:  AORTIC STENOSIS  POST-OPERATIVE DIAGNOSIS:  AORTIC STENOSIS  PROCEDURE:  Procedures:  REPLACEMENT, AORTIC VALVE, OPEN UTILIZING 25 MM  INSPIRIS RESILIA  AORTIC VALVE (N/A)  ECHOCARDIOGRAM, TRANSESOPHAGEAL, INTRAOPERATIVE (N/A)  SURGEON:  Surgeons and Role:    * Shyrl Linnie KIDD, MD - Primary  PHYSICIAN ASSISTANT: Rocky Shad PA-C  ASSISTANTS: Doyal Music RNFA   ANESTHESIA:   general  EBL: Per Anesthesia Record  BLOOD ADMINISTERED:  CC CELLSAVER  DRAINS: Mediastinal Chest Tubes   LOCAL MEDICATIONS USED:  NONE  SPECIMEN:  Source of Specimen:  Aortic Valve Leaflets  DISPOSITION OF SPECIMEN:  PATHOLOGY  COUNTS:  YES  TOURNIQUET:  * No tourniquets in log *  DICTATION: .Dragon Dictation  PLAN OF CARE: Admit to inpatient   PATIENT DISPOSITION:  ICU - intubated and hemodynamically stable.   Delay start of Pharmacological VTE agent (>24hrs) due to surgical blood loss or risk of bleeding: yes

## 2025-01-13 NOTE — Progress Notes (Signed)
 Post Extubation Patient family notified that patient allergic to morphine P: Discontinued morphine PRN  Placed low dose fentanyl  12.59mcg- 25mcg for breakthrough  Continue multimodal pain regimen- tramadol , oxy, with bowel reg   Sherlean Sharps AGACNP-BC   [facility] 01/02/2025, 8:41 PM  Call Cell: [phone] if have any emergent needs  7a -7p

## 2025-01-13 NOTE — Progress Notes (Signed)
" ° °  [address] [place] [address]             [phone]   S/p AVR  BP (!) 155/83   Pulse 81   Temp 99 F (37.2 C) (Oral)   Resp (!) 22   Ht 6' 1 (1.854 m)   Wt 105.2 kg   SpO2 99%   BMI 30.61 kg/m  18/5 CI 2.75 CVP 3 Neo @ 15 Amiodarone  at 60 mg/min   Intake/Output Summary (Last 24 hours) at 01/13/2025 1633 Last data filed at 01/13/2025 1600 Gross per 24 hour  Intake 3776.18 ml  Output 1645 ml  Net 2131.18 ml   CT ~ 300 ml since OR  Hct 31, PLT 120K  Doing well early postop  .Elspeth MOTE Kerrin, MD [facility] [phone]  "

## 2025-01-13 NOTE — Transfer of Care (Signed)
 Immediate Anesthesia Transfer of Care Note  Patient: Anthony Paul  Procedure(s) Performed: REPLACEMENT, AORTIC VALVE, OPEN UTILIZING 25 MM  INSPIRIS RESILIA  AORTIC VALVE (Chest) ECHOCARDIOGRAM, TRANSESOPHAGEAL, INTRAOPERATIVE  Patient Location: ICU  Anesthesia Type:General  Level of Consciousness: Patient remains intubated per anesthesia plan  Airway & Oxygen Therapy: Patient Spontanous Breathing, Patient remains intubated per anesthesia plan, and Patient placed on Ventilator (see vital sign flow sheet for setting)  Post-op Assessment: Report given to RN and Post -op Vital signs reviewed and stable  Post vital signs: Reviewed and stable  Last Vitals:  Vitals Value Taken Time  BP 99/66 01/13/25 14:45  Temp    Pulse 69 01/13/25 14:45  Resp    SpO2 100 % 01/13/25 14:45  Vitals shown include unfiled device data.  Last Pain:  Vitals:   01/13/25 0852  TempSrc:   PainSc: 0-No pain         Complications: No notable events documented.

## 2025-01-13 NOTE — Consult Note (Signed)
 "  NAME:  Anthony Paul, MRN:  [id number], DOB:  1956/01/02, LOS: 0 ADMISSION DATE:  01/13/2025, CONSULTATION DATE:  1/26 REFERRING MD:  Shyrl, CHIEF COMPLAINT:  s/p AVR    History of Present Illness:  69 year old male with past medical history of prediabetes, BPH, hyperlipidemia, tobacco use and COPD, bicuspid aortic valve with severe AS who presents for AVR.   Followed by Dr. Delford with cardiology for murmur since 2014. Found to have bicuspid AV. Had serial echos showing worsening disease. He had RHC 09/2024 showing patent coronaries, normal RHC, calcified bicuspid aortic valve with restricted mobility. Recommending CVTS evaluation. 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 was seen by Dr. Shyrl [facility] 12/27/24 for AVR, now s/p replacement.  Pump time: 102 Xclamp time: 71 EBL: ~ 460 UOP:  950 cc  Products: no blood products   Cell saver: 230 cc   Pertinent  Medical History  prediabetes, BPH, hyperlipidemia, tobacco use and COPD, bicuspid aortic valve with severe AS  Significant Hospital Events: Including procedures, antibiotic start and stop dates in addition to other pertinent events   1/26: s/p AVR ICU Admit   Interim History / Subjective:  Admit ICU s/p AVR   Objective   Blood pressure (!) 155/83, pulse 81, temperature 99 F (37.2 C), temperature source Oral, resp. rate (!) 22, height 6' 1 (1.854 m), weight 105.2 kg, SpO2 99%.        Intake/Output Summary (Last 24 hours) at 01/13/2025 1339 Last data filed at 01/13/2025 1332 Gross per 24 hour  Intake 2150 ml  Output 750 ml  Net 1400 ml   Filed Weights   01/13/25 0825  Weight: 105.2 kg    Examination: General: acute on chronic, older adult male, s/p AVR  HENT: normocephalic, PERRLA intact Lungs: cl, diminished, mediastinal and pleural chest tubes  Cardiovascular: s1,s2, pacing wires intact- not paced, frequent PVCs  Abdomen: bs active, soft  Extremities: moves all  extremities, intermittent commands  Neuro: RASS -1 to 2, moves all extremities, RUE > LUE in regards to strength  GU: foley intact   Resolved Hospital Problem list    Assessment & Plan:  Severe aortic stenosis s/p AVR  Bicuspid AV  Frequent PVCS,- occasion VT runs  - post-op management per TCTS - cbc, bmp now  - con't weaning pressors as able to maintain MAP >70-90. Albumin  boluses prn for low SV.  - avoid acidemia, coagulopathy, hypocalcemia, hypothermia - insulin  gtt for BG 100-180 - tele monitoring/ pacing prn - mediastinal drains per TCTS - multimodal pain control per protocol- oxycodone , tramadol , morphine with bowel regimen - complete post-op antibiotics - monitor electrolytes, replete PRN On neosynephrine, CVP ~ 4, continue to give volume via albumin   Start Amio gtt- 60mg /hr, no bolus per TCTS   Post bypass vasoplegia - wean vasopressors for MAP 70-90, albumin  prn   Post-operative vent management COPD  Remote tobacco use  - rapid wean per TCTS protocol - VAP/ PPI - CXR/ ABG now  - PRN Duonebs for now   Expected post-operative ABLA Expected post-operative consumptive thrombocytopenia  - trend  - transfuse for hgb <8 or significant bleeding  - correct coagulopathy with significant bleeding    Prediabetes  - A1c 01/09/25 5.8 - Continue PRN insulin    HLD  - statin   BPH  - monitor for retention  - not on home tamsulosin  or other   Labs   CBC: Recent Labs  Lab 01/09/25 1200 01/13/25 1041 01/13/25 1045  01/13/25 1128 01/13/25 1152 01/13/25 1243 01/13/25 1244  WBC 7.0  --   --   --   --   --   --   HGB 14.8   < > 13.6 12.9* 10.9* 11.5* 10.2*  HCT 42.8   < > 40.0 38.0* 32.0* 33.0* 30.0*  MCV 92.6  --   --   --   --   --   --   PLT 188  --   --   --   --  147*  --    < > = values in this interval not displayed.    Basic Metabolic Panel: Recent Labs  Lab 01/09/25 1200 01/13/25 1041 01/13/25 1045 01/13/25 1128 01/13/25 1152 01/13/25 1244  NA  140 141 142 140 140 139  K 4.2 4.2 4.2 4.1 4.1 4.8  CL 104  --  106 105 101 106  CO2 26  --   --   --   --   --   GLUCOSE 121*  --  117* 129* 132* 135*  BUN 17  --  19 17 15 16   CREATININE 1.10  --  1.10 1.10 1.00 1.10  CALCIUM  9.4  --   --   --   --   --    GFR: Estimated Creatinine Clearance: 80.7 mL/min (by C-G formula based on SCr of 1.1 mg/dL). Recent Labs  Lab 01/09/25 1200  WBC 7.0    Liver Function Tests: Recent Labs  Lab 01/09/25 1200  AST 19  ALT 21  ALKPHOS 59  BILITOT 0.3  PROT 6.4*  ALBUMIN  4.1   No results for input(s): LIPASE, AMYLASE in the last 168 hours. No results for input(s): AMMONIA in the last 168 hours.  ABG    Component Value Date/Time   PHART 7.330 (L) 01/13/2025 1041   PCO2ART 46.2 01/13/2025 1041   PO2ART 422 (H) 01/13/2025 1041   HCO3 24.3 01/13/2025 1041   TCO2 25 01/13/2025 1244   ACIDBASEDEF 2.0 01/13/2025 1041   O2SAT 100 01/13/2025 1041     Coagulation Profile: Recent Labs  Lab 01/09/25 1200  INR 1.0    Cardiac Enzymes: No results for input(s): CKTOTAL, CKMB, CKMBINDEX, TROPONINI in the last 168 hours.  HbA1C: HbA1c, POC (prediabetic range)  Date/Time Value Ref Range Status  08/26/2024 03:02 PM 5.6 (A) 5.7 - 6.4 % Final   HbA1c, POC (controlled diabetic range)  Date/Time Value Ref Range Status  08/26/2024 03:02 PM 5.6 0.0 - 7.0 % Final   HbA1c POC (<> result, manual entry)  Date/Time Value Ref Range Status  08/26/2024 03:02 PM 5.6 4.0 - 5.6 % Final   Hgb A1c MFr Bld  Date/Time Value Ref Range Status  01/09/2025 12:00 PM 5.8 (H) 4.8 - 5.6 % Final    Comment:    (NOTE) Diagnosis of Diabetes The following HbA1c ranges recommended by the American Diabetes Association (ADA) may be used as an aid in the diagnosis of diabetes mellitus.  Hemoglobin             Suggested A1C NGSP%              Diagnosis  <5.7                   Non Diabetic  5.7-6.4                Pre-Diabetic  >6.4  Diabetic  <7.0                   Glycemic control for                       adults with diabetes.    11/20/2024 01:43 PM 5.8 4.6 - 6.5 % Final    Comment:    Glycemic Control Guidelines for People with Diabetes:Non Diabetic:  <6%Goal of Therapy: <7%Additional Action Suggested:  >8%     CBG: No results for input(s): GLUCAP in the last 168 hours.  Review of Systems:   As above   Past Medical History:  He,  has a past medical history of Abnormal CT scan, chest, Aortic stenosis, Ascending aorta dilatation, Bicuspid aortic valve, BPH with obstruction/lower urinary tract symptoms, Chronic abdominal pain, COPD (chronic obstructive pulmonary disease) (HCC), diverticulosis, DJD (degenerative joint disease), ED (erectile dysfunction), Elevated blood pressure reading without diagnosis of hypertension, Heart murmur, colonic polyp (2015), Insomnia, Lumbar spondylosis, Mixed hyperlipidemia, Prediabetes, Tobacco abuse, and Vitamin B12 deficiency.   Surgical History:   Past Surgical History:  Procedure Laterality Date   CARDIAC CATHETERIZATION     09/2024 no signif CAD.   Carotid dopplers     03/2024 normal   COLON SURGERY  12/2010   For severe diverticulitis, 8 inches of colon was resected per patient report   COLONOSCOPY  01/13/2014   hx multiple adenomas.  Most recent 01/14/20->1 polyp->needs q5 yr colonoscopy (Dr. Leigh)   HEMORROIDECTOMY     INGUINAL HERNIA REPAIR Right 2015   Dr. Camellia Blush   RESECTION DISTAL CLAVICAL Right    RIGHT HEART CATH AND CORONARY ANGIOGRAPHY N/A 09/20/2024   Procedure: RIGHT HEART CATH AND CORONARY ANGIOGRAPHY;  Surgeon: Wonda Sharper, MD;  Location: [facility] INVASIVE CV LAB;  Service: Cardiovascular;  Laterality: N/A;   TONSILLECTOMY     TRANSTHORACIC ECHOCARDIOGRAM     03/2024, AS a bit worse (mod-to-severe), known bicuspid valve moderate AR.  08/2024 AS severe, aortic dilatation to 44 mm, grd II DD, otherwise normal.   UPPER GASTROINTESTINAL ENDOSCOPY      UPPER GI ENDOSCOPY  2015   Dr. Luis     Social History:   reports that he has been smoking cigarettes. He started smoking about 53 years ago. He has a 78.6 pack-year smoking history. He quit smokeless tobacco use about 18 years ago.  His smokeless tobacco use included chew. He reports current alcohol use of about 5.0 standard drinks of alcohol per week. He reports that he does not use drugs.   Family History:  His family history includes Benign prostatic hyperplasia in his father; Cancer in his sister; Diabetes in his mother; Heart disease in his father; Kidney disease in his sister. There is no history of Colon cancer, Esophageal cancer, Stomach cancer, or Rectal cancer.   Allergies Allergies[1]   Home Medications  Prior to Admission medications  Medication Sig Start Date End Date Taking? Authorizing Provider  aspirin  EC 81 MG tablet Take 81 mg by mouth every evening.   Yes [provider]  atorvastatin  (LIPITOR) 40 MG tablet TAKE 1 TABLET BY MOUTH EVERY DAY Patient taking differently: Take 40 mg by mouth every evening. 01/26/24  Yes Nishan, Peter C, MD  calcium  carbonate (TUMS - DOSED IN MG ELEMENTAL CALCIUM ) 500 MG chewable tablet Chew 1-2 tablets by mouth every evening.   Yes [provider]  celecoxib  (CELEBREX ) 200 MG capsule Take 200 mg by mouth daily in  the afternoon. 10/10/23  Yes [provider]  Cyanocobalamin  (VITAMIN B12 PO) Take 1,000 mcg by mouth every evening.   Yes [provider]  icosapent  Ethyl (VASCEPA ) 1 g capsule Take 2 capsules (2 g total) by mouth 2 (two) times daily. 02/14/24  Yes Delford Maude BROCKS, MD  MOVANTIK  25 MG TABS tablet TAKE 1 TABLET DAILY FOR REGULAR BM'S 03/06/21  Yes McClanahan, Kyra, NP  oxyCODONE -acetaminophen  (PERCOCET) 10-325 MG tablet Take 1 tablet by mouth every 4 (four) hours as needed for pain. 07/21/20  Yes [provider]  traZODone  (DESYREL ) 150 MG tablet Take      1/2 to 1 tablet       1 hour       before Bedtime for Sleep Patient taking differently: Take 150 mg by mouth at bedtime. 04/25/24  Yes McGowen, Aleene DEL, MD  VITAMIN D  PO Take 1,000 Units by mouth daily in the afternoon.   Yes [provider]  albuterol  (VENTOLIN  HFA) 108 (90 Base) MCG/ACT inhaler INHALE 2 PUFFS INTO THE LUNGS EVERY 4 (FOUR) HOURS AS NEEDED FOR WHEEZING OR SHORTNESS OF BREATH. PLEASE GIVE GENERIC OR THE ONE THAT INSURANCE COVERS 10/09/23   Jude Lonell BRAVO, NP     Critical care time: 60 mins    The patient is critically ill with multiple organ system failure and requires high complexity decision making for assessment and support, frequent evaluation and titration of therapies, advanced monitoring, review of radiographic studies and interpretation of complex data.    Critical Care Time devoted to patient care services, exclusive of separately billable procedures, described in this note is 60 mins   Christian Adelai Achey AGACNP-BC   [facility] 01/02/2025, 8:41 PM  Call Cell: [phone] if have any emergent needs  7a-7p        [1]  Allergies Allergen Reactions   Morphine Itching    Can take hydrocodone without issues   "

## 2025-01-13 NOTE — Discharge Summary (Signed)
 "      [address] [place] [address]             [phone]        Physician Discharge Summary  Patient ID: DEMARIS LEAVELL MRN: [id number] DOB/AGE: 05-16-1956 69 y.o.  Admit date: 01/13/2025 Discharge date: 01/23/2025  Admission Diagnoses:  Patient Active Problem List   Diagnosis Date Noted   History of adenomatous polyp of colon 06/09/2021   Ectatic thoracic aorta (HCC) - CT 09/2020 06/09/2021   Insomnia 06/09/2021   BPH (benign prostatic hyperplasia) 06/09/2021   Aortic atherosclerosis (HCC) - CT 09/2020 10/15/2020   Chronic left shoulder pain 06/08/2018   Chest pain, atypical most c/w ibs  04/13/2018   Cigarette smoker (70+ pack year current smoker) 04/13/2018   Abnormal glucose 03/08/2015   Vitamin D  deficiency 03/08/2015   Medication management 03/08/2015   ED (erectile dysfunction)    DJD (degenerative joint disease)    Bicuspid aortic valve 06/27/2012   Mixed hyperlipidemia 06/27/2012   Borderline systolic HTN 06/27/2012   Congenital insufficiency of aortic valve 06/27/2012   COPD GOLD 0     Discharge Diagnoses:  Patient Active Problem List   Diagnosis Date Noted   Ischemic stroke (HCC) 01/17/2025   Atrial fibrillation with rapid ventricular response (HCC) 01/17/2025   Seizure (HCC) 01/14/2025   S/P AVR (aortic valve replacement) 01/13/2025   History of adenomatous polyp of colon 06/09/2021   Ectatic thoracic aorta (HCC) - CT 09/2020 06/09/2021   Insomnia 06/09/2021   BPH (benign prostatic hyperplasia) 06/09/2021   Aortic atherosclerosis (HCC) - CT 09/2020 10/15/2020   Chronic left shoulder pain 06/08/2018   Chest pain, atypical most c/w ibs  04/13/2018   Cigarette smoker (70+ pack year current smoker) 04/13/2018   Abnormal glucose 03/08/2015   Vitamin D  deficiency 03/08/2015   Medication management 03/08/2015   ED (erectile dysfunction)    DJD (degenerative joint disease)    Bicuspid aortic valve 06/27/2012   Mixed  hyperlipidemia 06/27/2012   Borderline systolic HTN 06/27/2012   Congenital insufficiency of aortic valve 06/27/2012   COPD GOLD 0     Discharged Condition: stable  History of Present Illness:  Armas Mcbee is a 69 yo male with known history of COPD, HLD, HTN, and Aortic Stenosis.  He has been followed routinely by Cardiology with progression of his Aortic Stenosis.  It was felt he would benefit from TAVR procedure, however during workup it was noted his Left main height of 8.4 mm.  Due to this TAVR was not felt to be possible.  He was referred to Dr. Shyrl for surgical evaluation at which time the patient admitted to some shortness of breath with exertion.  He denied chest pain and near syncope.  He routinely sees a dentist and denied dental issues.  It was felt he would be a good candidate for aortic valve replacement.  The risks and benefits of the procedure were explained to the patient and he was agreeable to proceed.  Hospital Course:  Mr. Deboy presented to [facility] on 01/13/2025.  He was taken to the operating room and underwent Aortic Valve Replacement with a 25 mm Edwards Resilia Bioprosthetic Valve.  He tolerated the procedure without difficulty and was taken to the SICU in stable condition.  The patient was extubated the evening of surgery.  The patient required support with Neo-synephrine which was weaned as tolerated.  He developed unilateral weakness on his  left side.  Head CT scan was obtained and was negative.  Neurology consult was also obtained and felt it was difficult to determine if symptoms were due to somnolence.  They recommended MRI of the brain.  The patient remained somnolent, he developed a minute tonic clonic seizure.  He was treated with Ativan  but remained post-ictal.  He was re-intubated for airway protection.  He required initiation of Levophed  with rapidly escalating doses for blood pressure support. He was noted to be acidotic and once this was  corrected, he was able to be discontinued off both vasopressor requirements. He was started on anti-seizure medications including Keppra . MRI of the brain was obtained and showed a patchy small volume restricted diffusion involving the posterior left temporoccipital region, favored to be a small acute ischemic infarct.  He also underwent EEG monitoring which showed generalized cerebral dysfunction which was felt to be related to sedation. He was again weaned and extubated on 01/15/2025. He passed his swallow evaluation and was started on a heart healthy diet. He was routinely diuresed. He developed expected acute blood loss anemia and was transfused with 1U PRBCs. He developed atrial fibrillation and was started on IV Amiodarone , he converted to NSR and IV Amiodarone  was transitioned to PO Amiodarone .  He has also been started on Eliquis .  He was felt stable for transfer to the progressive unit.  He did develop some shortness of breath but CXR showed only a possible small pleural effusion.  A CT scan was obtained to rule out PE and this showed no pulmonary embolism but large left pleural effusion and moderate to large right pleural effusion with bibasilar atelectasis. IR was consulted for left sided thoracentesis on 2/2 which yielded 1.1L of dark maroon colored fluid. He was weaned to room air following the procedure. He still endorsed shortness of breath, IR was consulted for right sided thoracentesis 2/3 which yielded 800 mL of serous fluid. EKG showed diffuse ST elevation and he had a small pericardial effusion on chest CT. 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 He had a prolonged QT interval but neurology recommended continuing Keppra  for postoperative seizure, Dr. Shyrl recommended decreasing Amiodarone  to 200mg  BID for 7 days then 200mg  daily. Creatinine bumped slightly, Lasix  was discontinued and this improved. He was not discharged on Lasix  but he was told to  elevate legs and wear compression stockings to assist with lower extremity edema. His CXR showed bibasilar atelectasis but no further pleural effusions. He was ambulating well on room air and had no acute deficits following his stroke. His bowels began moving appropriately. His incisions were healing well without sign of infection. He was felt stable for discharge home with home health.   Consults: neurology, PCCM  Significant Diagnostic Studies: cardiac graphics:   Echocardiogram:    IMPRESSIONS    1. Left ventricular ejection fraction, by estimation, is 55 to 60%. The  left ventricle has normal function. The left ventricle has no regional  wall motion abnormalities. There is moderate concentric left ventricular  hypertrophy. Left ventricular  diastolic parameters are consistent with Grade II diastolic dysfunction  (pseudonormalization).   2. Right ventricular systolic function is normal. The right ventricular  size is normal.   3. The mitral valve is normal in structure. No evidence of mitral valve  regurgitation. No evidence of mitral stenosis.   4. The aortic valve is calcified. There is severe calcifcation of the  aortic valve. There is severe thickening of the aortic valve. Aortic  valve  regurgitation is moderate. Severe aortic valve stenosis. Aortic  regurgitation PHT measures 361 msec. Aortic  valve area, by VTI measures 1.19 cm. Aortic valve mean gradient measures  42.0 mmHg. Aortic valve Vmax measures 3.86 m/s.   5. Aortic dilatation noted. There is moderate dilatation of the ascending  aorta, measuring 44 mm.   6. The inferior vena cava is normal in size with greater than 50%  respiratory variability, suggesting right atrial pressure of 3 mmHg.   Treatments: surgery: 01/13/2025 Patient:  [name] Pre-Op Dx: severe aortic stenosis Moderate aortic insufficiency   Post-op Dx:  same Procedure: Aortic valve replacement with a 25mm Inspiris Resilia valve    Surgeon  and Role:      * Lightfoot, Linnie KIDD, MD - Primary  Discharge Exam: Blood pressure (!) 107/90, pulse 66, temperature 98.2 F (36.8 C), temperature source Oral, resp. rate 20, height 6' 1 (1.854 m), weight 104.4 kg, SpO2 98%. General appearance: alert, cooperative, and no distress Neurologic: intact Heart: regular rate and rhythm, S1, S2 normal, no murmur, click, rub or gallop Lungs: clear to auscultation bilaterally Abdomen: soft, non-tender; bowel sounds normal; no masses,  no organomegaly Extremities: edema trace-1+ edema BLE Wound: Clean and dry without sign of infection  Discharge Medications:  The patient has been discharged on:   1.Beta Blocker:  Yes [  X ]                              No   [   ]                              If No, reason:  2.Ace Inhibitor/ARB: Yes [   ]                                     No  [   X ]                                     If No, reason: Soft BP  3.Statin:   Yes [ X  ]                  No  [   ]                  If No, reason:  4.Ecasa:  Yes  [  X ]                  No   [   ]                  If No, reason:  Patient had ACS upon admission: No  Plavix/P2Y12 inhibitor: Yes [   ]                                      No  [ X  ]     Discharge Instructions     Amb Referral to Cardiac Rehabilitation   Complete by: As directed    Diagnosis: Valve Replacement   Valve: Aortic   After initial evaluation and assessments completed: Virtual  Based Care may be provided alone or in conjunction with Phase 2 Cardiac Rehab based on patient barriers.: Yes   Intensive Cardiac Rehabilitation (ICR) [facility] location only OR Traditional Cardiac Rehabilitation (TCR) *If criteria for ICR are not met will enroll in TCR ([facility] only): Yes   Ambulatory referral to Neurology   Complete by: As directed    An appointment is requested in approximately: 4-8 weeks      Allergies as of 01/23/2025       Reactions   Morphine Itching   Can take hydrocodone without  issues        Medication List     STOP taking these medications    celecoxib  200 MG capsule Commonly known as: CELEBREX        TAKE these medications    albuterol  108 (90 Base) MCG/ACT inhaler Commonly known as: VENTOLIN  HFA INHALE 2 PUFFS INTO THE LUNGS EVERY 4 (FOUR) HOURS AS NEEDED FOR WHEEZING OR SHORTNESS OF BREATH. PLEASE GIVE GENERIC OR THE ONE THAT INSURANCE COVERS   amiodarone  200 MG tablet Commonly known as: PACERONE  Take 1 tablet twice per day for 5 days then take 1 tablet daily thereafter   apixaban  5 MG Tabs tablet Commonly known as: ELIQUIS  Take 1 tablet (5 mg total) by mouth 2 (two) times daily.   aspirin  EC 81 MG tablet Take 81 mg by mouth every evening.   atorvastatin  40 MG tablet Commonly known as: LIPITOR Take 1 tablet (40 mg total) by mouth every evening. Please keep upcoming appointment in February for future refills. Thank you. What changed:  when to take this additional instructions   calcium  carbonate 500 MG chewable tablet Commonly known as: TUMS - dosed in mg elemental calcium  Chew 1-2 tablets by mouth every evening.   colchicine  0.6 MG tablet Take 1 tablet (0.6 mg total) by mouth daily.   Fe Fum-Vit C-Vit B12-FA Caps capsule Commonly known as: TRIGELS-F FORTE Take 1 capsule by mouth daily after breakfast. May take similar generic medication   icosapent  Ethyl 1 g capsule Commonly known as: VASCEPA  Take 2 capsules (2 g total) by mouth 2 (two) times daily.   levETIRAcetam  500 MG tablet Commonly known as: KEPPRA  Take 1 tablet (500 mg total) by mouth 2 (two) times daily.   Metoprolol  Tartrate 37.5 MG Tabs Take 1 tablet (37.5 mg total) by mouth 2 (two) times daily.   Movantik  25 MG Tabs tablet Generic drug: naloxegol  oxalate TAKE 1 TABLET DAILY FOR REGULAR BM'S   oxyCODONE -acetaminophen  10-325 MG tablet Commonly known as: PERCOCET Take 1 tablet by mouth every 4 (four) hours as needed for pain.   tamsulosin  0.4 MG Caps  capsule Commonly known as: FLOMAX  Take 1 capsule (0.4 mg total) by mouth daily.   traZODone  150 MG tablet Commonly known as: DESYREL  Take      1/2 to 1 tablet       1 hour      before Bedtime for Sleep What changed:  how much to take how to take this when to take this additional instructions   VITAMIN B12 PO Take 1,000 mcg by mouth every evening.   VITAMIN D  PO Take 1,000 Units by mouth daily in the afternoon.               Durable Medical Equipment  (From admission, onward)           Start     Ordered   01/20/25 1548  For home use only DME 3 n 1  Once        01/20/25 1547   01/20/25 1547  For home use only DME Walker rolling  Once       Question Answer Comment  Walker: With 5 Inch Wheels   Patient needs a walker to treat with the following condition Physical deconditioning   Patient needs a walker to treat with the following condition S/P AVR (aortic valve replacement)      01/20/25 1547            Follow-up Information     Janene Boer, PA Follow up on 01/31/2025.   Specialties: Cardiology, Radiology Why: Cardiology appointment is at 2:45 PM Contact information: [address] [place] [address] [phone]         Candise Aleene DEL, MD. Schedule an appointment as soon as possible for a visit.   Specialty: Family Medicine Why: Within 2 weeks of discharge for hospital follow up Contact information: [address] [place] [address] [phone]         Shyrl Linnie KIDD, MD Follow up on 01/31/2025.   Specialty: Cardiothoracic Surgery Why: Virtual appointment is at 2:30PM. Please do NOT come to the office as this is a VIRTUAL appointment. Dr. Shyrl will call you. Contact information: [address], [place] [address] [phone]         [facility]g [facility] at [facility] [place] Follow up on 02/24/2025.   Specialty: Cardiology Why: Echocardiogram is at 3:05PM Contact  information: [address] [place]  [address] [phone]        [facility]) Follow up.   Specialty: Home Health Services Why: HHPT/OT - T[facility] office made referral for [facility] needs- they will contact you to schedule Contact information: [address] [place]  [address] [phone]        Rosemarie Eather RAMAN, MD. Schedule an appointment as soon as possible for a visit in 8 week(s).   Specialties: Neurology, Radiology Why: May follow up with any provider at [facility] information: [address] [place] [address] [phone]         [facility] (DME) Follow up.   Specialty: DME Services Why: Rolling walker arranged- to be delivered to pt prior to discharge Contact information: [address] [facility] [place]  [address] [phone]                Signed:  Con RAMAN Raguel DEVONNA  01/23/2025, 11:57 AM   "

## 2025-01-13 NOTE — Anesthesia Procedure Notes (Signed)
 Central Venous Catheter Insertion Performed by: Darlyn Rush, MD, anesthesiologist Start/End1/26/2026 10:05 AM, 01/13/2025 10:10 AM Patient location: Pre-op. Preanesthetic checklist: patient identified, IV checked, site marked, risks and benefits discussed, surgical consent, monitors and equipment checked, pre-op evaluation, timeout performed and anesthesia consent Hand hygiene performed  and maximum sterile barriers used  PA cath was placed.Swan type:thermodilution PA Cath depth:51 Attempts: 1 Post procedure assessment: free fluid flow and no air  Patient tolerated the procedure well with no immediate complications.

## 2025-01-13 NOTE — Hospital Course (Addendum)
 History of Present Illness:  Anthony Paul is a 69 yo male with known history of COPD, HLD, HTN, and Aortic Stenosis.  He has been followed routinely by Cardiology with progression of his Aortic Stenosis.  It was felt he would benefit from TAVR procedure, however during workup it was noted his Left main height of 8.4 mm.  Due to this TAVR was not felt to be possible.  He was referred to Dr. Shyrl for surgical evaluation at which time the patient admitted to some shortness of breath with exertion.  He denied chest pain and near syncope.  He routinely sees a dentist and denied dental issues.  It was felt he would be a good candidate for aortic valve replacement.  The risks and benefits of the procedure were explained to the patient and he was agreeable to proceed.  Hospital Course:  Anthony Paul presented to [facility] on 01/13/2025.  He was taken to the operating room and underwent Aortic Valve Replacement with a 25 mm Edwards Resilia Bioprosthetic Valve.  He tolerated the procedure without difficulty and was taken to the SICU in stable condition.  The patient was extubated the evening of surgery.  The patient required support with Neo-synephrine which was weaned as tolerated.  He developed unilateral weakness on his left side.  Head CT scan was obtained and was negative.  Neurology consult was also obtained and felt it was difficult to determine if symptoms were due to somnolence.  They recommended MRI of the brain.  The patient remained somnolent, he developed a minute tonic clonic seizure.  He was treated with Ativan  but remained post-ictal.  He was re-intubated for airway protection.  He required initiation of Levophed  with rapidly escalating doses for blood pressure support. He was noted to be acidotic and once this was corrected, he was able to be discontinued off both vasopressor requirements. He was started on anti-seizure medications including Keppra . MRI of the brain was obtained and showed a  patchy small volume restricted diffusion involving the posterior left temporoccipital region, favored to be a small acute ischemic infarct.  He also underwent EEG monitoring which showed generalized cerebral dysfunction which was felt to be related to sedation. He was again weaned and extubated on 01/15/2025. He passed his swallow evaluation and was started on a heart healthy diet. He was routinely diuresed. He developed expected acute blood loss anemia and was transfused with 1U PRBCs. He developed atrial fibrillation and was started on IV Amiodarone , he converted to NSR and IV Amiodarone  was transitioned to PO Amiodarone .  He has also been started on Eliquis .  He was felt stable for transfer to the progressive unit.  He did develop some shortness of breath but CXR showed only a possible small pleural effusion.  A CT scan was obtained to rule out PE and this showed no pulmonary embolism but large left pleural effusion and moderate to large right pleural effusion with bibasilar atelectasis. IR was consulted for left sided thoracentesis on 2/2 which yielded 1.1L of dark maroon colored fluid. He was weaned to room air following the procedure. He still endorsed shortness of breath, IR was consulted for right sided thoracentesis 2/3 which yielded 800 mL of serous fluid. EKG showed diffuse ST elevation and he had a small pericardial effusion on chest CT. 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 He had a prolonged QT interval but neurology recommended continuing Keppra  for postoperative seizure, Dr. Shyrl recommended decreasing Amiodarone  to 200mg  BID for  7 days then 200mg  daily. He was ambulating well on room air and had no acute deficits following his stroke. His bowels began moving appropriately. His incisions were healing well without sign of infection. He was felt stable for discharge home with home health.

## 2025-01-14 ENCOUNTER — Inpatient Hospital Stay (HOSPITAL_COMMUNITY)

## 2025-01-14 ENCOUNTER — Encounter (HOSPITAL_COMMUNITY): Payer: Self-pay | Admitting: Thoracic Surgery (Cardiothoracic Vascular Surgery)

## 2025-01-14 DIAGNOSIS — J9601 Acute respiratory failure with hypoxia: Secondary | ICD-10-CM

## 2025-01-14 DIAGNOSIS — J449 Chronic obstructive pulmonary disease, unspecified: Secondary | ICD-10-CM | POA: Diagnosis not present

## 2025-01-14 DIAGNOSIS — T790XXA Air embolism (traumatic), initial encounter: Secondary | ICD-10-CM | POA: Diagnosis not present

## 2025-01-14 DIAGNOSIS — G4089 Other seizures: Secondary | ICD-10-CM

## 2025-01-14 DIAGNOSIS — N400 Enlarged prostate without lower urinary tract symptoms: Secondary | ICD-10-CM | POA: Diagnosis not present

## 2025-01-14 DIAGNOSIS — I493 Ventricular premature depolarization: Secondary | ICD-10-CM | POA: Diagnosis not present

## 2025-01-14 DIAGNOSIS — R569 Unspecified convulsions: Secondary | ICD-10-CM

## 2025-01-14 DIAGNOSIS — Q2381 Bicuspid aortic valve: Secondary | ICD-10-CM | POA: Diagnosis not present

## 2025-01-14 DIAGNOSIS — I6350 Cerebral infarction due to unspecified occlusion or stenosis of unspecified cerebral artery: Secondary | ICD-10-CM

## 2025-01-14 DIAGNOSIS — Z48812 Encounter for surgical aftercare following surgery on the circulatory system: Secondary | ICD-10-CM | POA: Diagnosis not present

## 2025-01-14 DIAGNOSIS — G40909 Epilepsy, unspecified, not intractable, without status epilepticus: Secondary | ICD-10-CM | POA: Diagnosis not present

## 2025-01-14 DIAGNOSIS — Z952 Presence of prosthetic heart valve: Secondary | ICD-10-CM | POA: Diagnosis not present

## 2025-01-14 DIAGNOSIS — E785 Hyperlipidemia, unspecified: Secondary | ICD-10-CM | POA: Diagnosis not present

## 2025-01-14 DIAGNOSIS — Z720 Tobacco use: Secondary | ICD-10-CM | POA: Diagnosis not present

## 2025-01-14 DIAGNOSIS — Z48813 Encounter for surgical aftercare following surgery on the respiratory system: Secondary | ICD-10-CM | POA: Diagnosis not present

## 2025-01-14 DIAGNOSIS — I350 Nonrheumatic aortic (valve) stenosis: Secondary | ICD-10-CM | POA: Diagnosis not present

## 2025-01-14 DIAGNOSIS — R739 Hyperglycemia, unspecified: Secondary | ICD-10-CM | POA: Diagnosis not present

## 2025-01-14 LAB — POCT I-STAT 7, (LYTES, BLD GAS, ICA,H+H)
Acid-Base Excess: 2 mmol/L (ref 0.0–2.0)
Acid-Base Excess: 0 mmol/L (ref 0.0–2.0)
Acid-base deficit: 1 mmol/L (ref 0.0–2.0)
Acid-base deficit: 10 mmol/L — ABNORMAL HIGH (ref 0.0–2.0)
Acid-base deficit: 21 mmol/L — ABNORMAL HIGH (ref 0.0–2.0)
Acid-base deficit: 1 mmol/L (ref 0.0–2.0)
Bicarbonate: 21.1 mmol/L (ref 20.0–28.0)
Bicarbonate: 10.8 mmol/L — ABNORMAL LOW (ref 20.0–28.0)
Bicarbonate: 17.7 mmol/L — ABNORMAL LOW (ref 20.0–28.0)
Bicarbonate: 23.6 mmol/L (ref 20.0–28.0)
Bicarbonate: 25.8 mmol/L (ref 20.0–28.0)
Bicarbonate: 23.6 mmol/L (ref 20.0–28.0)
Calcium, Ion: 1.16 mmol/L (ref 1.15–1.40)
Calcium, Ion: 1.13 mmol/L — ABNORMAL LOW (ref 1.15–1.40)
Calcium, Ion: 1.24 mmol/L (ref 1.15–1.40)
Calcium, Ion: 1.1 mmol/L — ABNORMAL LOW (ref 1.15–1.40)
Calcium, Ion: 1.08 mmol/L — ABNORMAL LOW (ref 1.15–1.40)
Calcium, Ion: 1.11 mmol/L — ABNORMAL LOW (ref 1.15–1.40)
HCT: 23 % — ABNORMAL LOW (ref 39.0–52.0)
HCT: 22 % — ABNORMAL LOW (ref 39.0–52.0)
HCT: 23 % — ABNORMAL LOW (ref 39.0–52.0)
HCT: 22 % — ABNORMAL LOW (ref 39.0–52.0)
HCT: 23 % — ABNORMAL LOW (ref 39.0–52.0)
HCT: 23 % — ABNORMAL LOW (ref 39.0–52.0)
Hemoglobin: 7.8 g/dL — ABNORMAL LOW (ref 13.0–17.0)
Hemoglobin: 7.5 g/dL — ABNORMAL LOW (ref 13.0–17.0)
Hemoglobin: 7.8 g/dL — ABNORMAL LOW (ref 13.0–17.0)
Hemoglobin: 7.5 g/dL — ABNORMAL LOW (ref 13.0–17.0)
Hemoglobin: 7.8 g/dL — ABNORMAL LOW (ref 13.0–17.0)
Hemoglobin: 7.8 g/dL — ABNORMAL LOW (ref 13.0–17.0)
O2 Saturation: 84 %
O2 Saturation: 90 %
O2 Saturation: 98 %
O2 Saturation: 100 %
O2 Saturation: 100 %
O2 Saturation: 100 %
Patient temperature: 38.5
Patient temperature: 38.1
Patient temperature: 38.7
Patient temperature: 38.1
Patient temperature: 38.2
Patient temperature: 38
Potassium: 3.7 mmol/L (ref 3.5–5.1)
Potassium: 3.5 mmol/L (ref 3.5–5.1)
Potassium: 4 mmol/L (ref 3.5–5.1)
Potassium: 3.4 mmol/L — ABNORMAL LOW (ref 3.5–5.1)
Potassium: 3.3 mmol/L — ABNORMAL LOW (ref 3.5–5.1)
Potassium: 3.4 mmol/L — ABNORMAL LOW (ref 3.5–5.1)
Sodium: 140 mmol/L (ref 135–145)
Sodium: 144 mmol/L (ref 135–145)
Sodium: 145 mmol/L (ref 135–145)
Sodium: 145 mmol/L (ref 135–145)
Sodium: 145 mmol/L (ref 135–145)
Sodium: 145 mmol/L (ref 135–145)
TCO2: 22 mmol/L (ref 22–32)
TCO2: 12 mmol/L — ABNORMAL LOW (ref 22–32)
TCO2: 19 mmol/L — ABNORMAL LOW (ref 22–32)
TCO2: 25 mmol/L (ref 22–32)
TCO2: 27 mmol/L (ref 22–32)
TCO2: 25 mmol/L (ref 22–32)
pCO2 arterial: 27 mmHg — ABNORMAL LOW (ref 32–48)
pCO2 arterial: 47.3 mmHg (ref 32–48)
pCO2 arterial: 59.5 mmHg — ABNORMAL HIGH (ref 32–48)
pCO2 arterial: 42.6 mmHg (ref 32–48)
pCO2 arterial: 35.4 mmHg (ref 32–48)
pCO2 arterial: 36.4 mmHg (ref 32–48)
pH, Arterial: 7.506 — ABNORMAL HIGH (ref 7.35–7.45)
pH, Arterial: 6.881 — CL (ref 7.35–7.45)
pH, Arterial: 7.188 — CL (ref 7.35–7.45)
pH, Arterial: 7.357 (ref 7.35–7.45)
pH, Arterial: 7.475 — ABNORMAL HIGH (ref 7.35–7.45)
pH, Arterial: 7.424 (ref 7.35–7.45)
pO2, Arterial: 47 mmHg — ABNORMAL LOW (ref 83–108)
pO2, Arterial: 107 mmHg (ref 83–108)
pO2, Arterial: 125 mmHg — ABNORMAL HIGH (ref 83–108)
pO2, Arterial: 190 mmHg — ABNORMAL HIGH (ref 83–108)
pO2, Arterial: 338 mmHg — ABNORMAL HIGH (ref 83–108)
pO2, Arterial: 171 mmHg — ABNORMAL HIGH (ref 83–108)

## 2025-01-14 LAB — CBC
HCT: 22.6 % — ABNORMAL LOW (ref 39.0–52.0)
HCT: 22.5 % — ABNORMAL LOW (ref 39.0–52.0)
Hemoglobin: 8.1 g/dL — ABNORMAL LOW (ref 13.0–17.0)
Hemoglobin: 8 g/dL — ABNORMAL LOW (ref 13.0–17.0)
MCH: 33.2 pg (ref 26.0–34.0)
MCH: 33.2 pg (ref 26.0–34.0)
MCHC: 35.8 g/dL (ref 30.0–36.0)
MCHC: 35.6 g/dL (ref 30.0–36.0)
MCV: 92.6 fL (ref 80.0–100.0)
MCV: 93.4 fL (ref 80.0–100.0)
Platelets: 108 10*3/uL — ABNORMAL LOW (ref 150–400)
Platelets: 120 10*3/uL — ABNORMAL LOW (ref 150–400)
RBC: 2.44 MIL/uL — ABNORMAL LOW (ref 4.22–5.81)
RBC: 2.41 MIL/uL — ABNORMAL LOW (ref 4.22–5.81)
RDW: 12.5 % (ref 11.5–15.5)
RDW: 12.7 % (ref 11.5–15.5)
WBC: 13.5 10*3/uL — ABNORMAL HIGH (ref 4.0–10.5)
WBC: 26 10*3/uL — ABNORMAL HIGH (ref 4.0–10.5)
nRBC: 0 % (ref 0.0–0.2)
nRBC: 0 % (ref 0.0–0.2)

## 2025-01-14 LAB — BASIC METABOLIC PANEL WITH GFR
Anion gap: 12 (ref 5–15)
Anion gap: 9 (ref 5–15)
BUN: 18 mg/dL (ref 8–23)
BUN: 21 mg/dL (ref 8–23)
CO2: 21 mmol/L — ABNORMAL LOW (ref 22–32)
CO2: 26 mmol/L (ref 22–32)
Calcium: 8.3 mg/dL — ABNORMAL LOW (ref 8.9–10.3)
Calcium: 8.2 mg/dL — ABNORMAL LOW (ref 8.9–10.3)
Chloride: 108 mmol/L (ref 98–111)
Chloride: 106 mmol/L (ref 98–111)
Creatinine, Ser: 1.14 mg/dL (ref 0.61–1.24)
Creatinine, Ser: 1.37 mg/dL — ABNORMAL HIGH (ref 0.61–1.24)
GFR, Estimated: >60 mL/min
GFR, Estimated: 56 mL/min — ABNORMAL LOW
Glucose, Bld: 138 mg/dL — ABNORMAL HIGH (ref 70–99)
Glucose, Bld: 146 mg/dL — ABNORMAL HIGH (ref 70–99)
Potassium: 3.6 mmol/L (ref 3.5–5.1)
Potassium: 3.7 mmol/L (ref 3.5–5.1)
Sodium: 141 mmol/L (ref 135–145)
Sodium: 141 mmol/L (ref 135–145)

## 2025-01-14 LAB — GLUCOSE, CAPILLARY
Glucose-Capillary: 124 mg/dL — ABNORMAL HIGH (ref 70–99)
Glucose-Capillary: 126 mg/dL — ABNORMAL HIGH (ref 70–99)
Glucose-Capillary: 133 mg/dL — ABNORMAL HIGH (ref 70–99)
Glucose-Capillary: 136 mg/dL — ABNORMAL HIGH (ref 70–99)
Glucose-Capillary: 136 mg/dL — ABNORMAL HIGH (ref 70–99)
Glucose-Capillary: 142 mg/dL — ABNORMAL HIGH (ref 70–99)
Glucose-Capillary: 143 mg/dL — ABNORMAL HIGH (ref 70–99)
Glucose-Capillary: 144 mg/dL — ABNORMAL HIGH (ref 70–99)
Glucose-Capillary: 145 mg/dL — ABNORMAL HIGH (ref 70–99)
Glucose-Capillary: 149 mg/dL — ABNORMAL HIGH (ref 70–99)
Glucose-Capillary: 154 mg/dL — ABNORMAL HIGH (ref 70–99)
Glucose-Capillary: 158 mg/dL — ABNORMAL HIGH (ref 70–99)
Glucose-Capillary: 162 mg/dL — ABNORMAL HIGH (ref 70–99)
Glucose-Capillary: 162 mg/dL — ABNORMAL HIGH (ref 70–99)
Glucose-Capillary: 177 mg/dL — ABNORMAL HIGH (ref 70–99)
Glucose-Capillary: 183 mg/dL — ABNORMAL HIGH (ref 70–99)
Glucose-Capillary: 200 mg/dL — ABNORMAL HIGH (ref 70–99)
Glucose-Capillary: 213 mg/dL — ABNORMAL HIGH (ref 70–99)

## 2025-01-14 LAB — PROTIME-INR
INR: 1.4 — ABNORMAL HIGH (ref 0.8–1.2)
Prothrombin Time: 17.7 s — ABNORMAL HIGH (ref 11.4–15.2)

## 2025-01-14 LAB — MAGNESIUM
Magnesium: 2.3 mg/dL (ref 1.7–2.4)
Magnesium: 3.1 mg/dL — ABNORMAL HIGH (ref 1.7–2.4)

## 2025-01-14 LAB — APTT: aPTT: 33 s (ref 24–36)

## 2025-01-14 MED ORDER — POTASSIUM CHLORIDE 20 MEQ PO PACK
40.0000 meq | PACK | Freq: Once | ORAL | Status: AC
  Administered 2025-01-14: 40 meq
  Filled 2025-01-14: qty 2

## 2025-01-14 MED ORDER — LEVETIRACETAM (KEPPRA) 500 MG/5 ML ADULT IV PUSH
500.0000 mg | Freq: Two times a day (BID) | INTRAVENOUS | Status: DC
  Administered 2025-01-14 – 2025-01-16 (×4): 500 mg via INTRAVENOUS
  Filled 2025-01-14 (×4): qty 5

## 2025-01-14 MED ORDER — PROPOFOL 1000 MG/100ML IV EMUL
0.0000 ug/kg/min | INTRAVENOUS | Status: DC
  Administered 2025-01-14: 30 ug/kg/min via INTRAVENOUS
  Administered 2025-01-14: 40 ug/kg/min via INTRAVENOUS
  Administered 2025-01-14: 50 ug/kg/min via INTRAVENOUS
  Administered 2025-01-14: 40 ug/kg/min via INTRAVENOUS
  Filled 2025-01-14 (×3): qty 100
  Filled 2025-01-14: qty 200
  Filled 2025-01-14 (×2): qty 100

## 2025-01-14 MED ORDER — PANTOPRAZOLE SODIUM 40 MG IV SOLR
40.0000 mg | Freq: Every day | INTRAVENOUS | Status: DC
  Administered 2025-01-14 – 2025-01-15 (×2): 40 mg via INTRAVENOUS
  Filled 2025-01-14 (×3): qty 10

## 2025-01-14 MED ORDER — ORAL CARE MOUTH RINSE: 15.0000 mL | OROMUCOSAL | Status: DC | PRN

## 2025-01-14 MED ORDER — ORAL CARE MOUTH RINSE
15.0000 mL | OROMUCOSAL | Status: DC
  Administered 2025-01-14 – 2025-01-15 (×7): 15 mL via OROMUCOSAL

## 2025-01-14 MED ORDER — ETOMIDATE 2 MG/ML IV SOLN
INTRAVENOUS | Status: AC
  Administered 2025-01-14: 2 mg via INTRAVENOUS
  Filled 2025-01-14: qty 20

## 2025-01-14 MED ORDER — DOCUSATE SODIUM 50 MG/5ML PO LIQD
200.0000 mg | Freq: Every day | ORAL | Status: DC
  Administered 2025-01-14: 200 mg
  Filled 2025-01-14: qty 20

## 2025-01-14 MED ORDER — POTASSIUM CHLORIDE CRYS ER 20 MEQ PO TBCR: 40.0000 meq | EXTENDED_RELEASE_TABLET | Freq: Once | ORAL | Status: DC

## 2025-01-14 MED ORDER — OXYCODONE HCL 5 MG PO TABS
5.0000 mg | ORAL_TABLET | ORAL | Status: DC | PRN
  Administered 2025-01-15: 5 mg
  Filled 2025-01-14: qty 1

## 2025-01-14 MED ORDER — AMIODARONE LOAD VIA INFUSION
150.0000 mg | Freq: Once | INTRAVENOUS | Status: AC
  Administered 2025-01-14: 150 mg via INTRAVENOUS

## 2025-01-14 MED ORDER — PROPOFOL 1000 MG/100ML IV EMUL
INTRAVENOUS | Status: AC
  Administered 2025-01-14: 20 ug/kg/min via INTRAVENOUS
  Filled 2025-01-14: qty 100

## 2025-01-14 MED ORDER — LEVETIRACETAM (KEPPRA) 500 MG/5 ML ADULT IV PUSH
20.0000 mg/kg | Freq: Once | INTRAVENOUS | Status: AC
  Administered 2025-01-14: 2000 mg via INTRAVENOUS
  Filled 2025-01-14: qty 20

## 2025-01-14 MED ORDER — LORAZEPAM 2 MG/ML IJ SOLN
INTRAMUSCULAR | Status: AC
  Administered 2025-01-14: 2 mg via INTRAVENOUS
  Filled 2025-01-14: qty 1

## 2025-01-14 MED ORDER — REVEFENACIN 175 MCG/3ML IN SOLN
175.0000 ug | Freq: Every day | RESPIRATORY_TRACT | Status: DC
  Administered 2025-01-15 – 2025-01-23 (×8): 175 ug via RESPIRATORY_TRACT
  Filled 2025-01-14 (×8): qty 3

## 2025-01-14 MED ORDER — ROCURONIUM BROMIDE 10 MG/ML (PF) SYRINGE: 10.0000 mL | PREFILLED_SYRINGE | Freq: Once | INTRAVENOUS | Status: AC

## 2025-01-14 MED ORDER — ETOMIDATE 2 MG/ML IV SOLN: 2.0000 mg | Freq: Once | INTRAVENOUS | Status: AC

## 2025-01-14 MED ORDER — NOREPINEPHRINE 4 MG/250ML-% IV SOLN
INTRAVENOUS | Status: AC
  Administered 2025-01-14: 5 ug/min via INTRAVENOUS
  Filled 2025-01-14: qty 250

## 2025-01-14 MED ORDER — FENTANYL CITRATE (PF) 50 MCG/ML IJ SOSY
PREFILLED_SYRINGE | INTRAMUSCULAR | Status: AC
  Filled 2025-01-14: qty 1

## 2025-01-14 MED ORDER — PHENYLEPHRINE 80 MCG/ML (10ML) SYRINGE FOR IV PUSH (FOR BLOOD PRESSURE SUPPORT): 80.0000 ug | PREFILLED_SYRINGE | Freq: Once | INTRAVENOUS | Status: AC | PRN

## 2025-01-14 MED ORDER — ATORVASTATIN CALCIUM 40 MG PO TABS
40.0000 mg | ORAL_TABLET | Freq: Every evening | ORAL | Status: DC
  Administered 2025-01-14: 40 mg
  Filled 2025-01-14: qty 1

## 2025-01-14 MED ORDER — POTASSIUM CHLORIDE 10 MEQ/50ML IV SOLN
10.0000 meq | INTRAVENOUS | Status: AC
  Administered 2025-01-14 (×4): 10 meq via INTRAVENOUS
  Filled 2025-01-14 (×4): qty 50

## 2025-01-14 MED ORDER — FENTANYL CITRATE (PF) 50 MCG/ML IJ SOSY
PREFILLED_SYRINGE | INTRAMUSCULAR | Status: AC
  Filled 2025-01-14: qty 2

## 2025-01-14 MED ORDER — MIDAZOLAM HCL 2 MG/2ML IJ SOLN
INTRAMUSCULAR | Status: AC
  Filled 2025-01-14: qty 2

## 2025-01-14 MED ORDER — ARFORMOTEROL TARTRATE 15 MCG/2ML IN NEBU
15.0000 ug | INHALATION_SOLUTION | Freq: Two times a day (BID) | RESPIRATORY_TRACT | Status: DC
  Administered 2025-01-14 – 2025-01-23 (×15): 15 ug via RESPIRATORY_TRACT
  Filled 2025-01-14 (×16): qty 2

## 2025-01-14 MED ORDER — ROCURONIUM BROMIDE 10 MG/ML (PF) SYRINGE
PREFILLED_SYRINGE | INTRAVENOUS | Status: AC
  Administered 2025-01-14: 100 mg via INTRAVENOUS
  Filled 2025-01-14: qty 10

## 2025-01-14 MED ORDER — MIDAZOLAM HCL (PF) 2 MG/2ML IJ SOLN
2.0000 mg | INTRAMUSCULAR | Status: DC | PRN
  Administered 2025-01-14: 2 mg via INTRAVENOUS
  Filled 2025-01-14: qty 2

## 2025-01-14 MED ORDER — NOREPINEPHRINE 4 MG/250ML-% IV SOLN
0.0000 ug/min | INTRAVENOUS | Status: DC
  Administered 2025-01-14: 5 ug/min via INTRAVENOUS
  Administered 2025-01-14 (×2): 11 ug/min via INTRAVENOUS
  Administered 2025-01-15: 7 ug/min via INTRAVENOUS
  Filled 2025-01-14 (×3): qty 250

## 2025-01-14 MED ORDER — PHENYLEPHRINE 80 MCG/ML (10ML) SYRINGE FOR IV PUSH (FOR BLOOD PRESSURE SUPPORT)
PREFILLED_SYRINGE | INTRAVENOUS | Status: AC
  Administered 2025-01-14: 80 ug via INTRAVENOUS
  Filled 2025-01-14: qty 10

## 2025-01-14 MED ORDER — OXYCODONE HCL 5 MG PO TABS
5.0000 mg | ORAL_TABLET | Freq: Four times a day (QID) | ORAL | Status: DC
  Administered 2025-01-14 – 2025-01-15 (×4): 5 mg
  Filled 2025-01-14 (×4): qty 1

## 2025-01-14 MED ORDER — LORAZEPAM 2 MG/ML IJ SOLN: 2.0000 mg | Freq: Once | INTRAMUSCULAR | Status: AC

## 2025-01-14 MED ORDER — SODIUM BICARBONATE 8.4 % IV SOLN
150.0000 meq | Freq: Once | INTRAVENOUS | Status: AC
  Filled 2025-01-14: qty 100

## 2025-01-14 MED ORDER — SODIUM BICARBONATE 8.4 % IV SOLN
INTRAVENOUS | Status: AC
  Administered 2025-01-14: 50 meq via INTRAVENOUS
  Filled 2025-01-14: qty 150

## 2025-01-14 NOTE — Plan of Care (Signed)
  Problem: Clinical Measurements: Goal: Diagnostic test results will improve Outcome: Progressing Goal: Respiratory complications will improve Outcome: Progressing Goal: Cardiovascular complication will be avoided Outcome: Progressing   Problem: Activity: Goal: Risk for activity intolerance will decrease Outcome: Progressing   Problem: Nutrition: Goal: Adequate nutrition will be maintained Outcome: Progressing   Problem: Coping: Goal: Level of anxiety will decrease Outcome: Progressing   Problem: Safety: Goal: Ability to remain free from injury will improve Outcome: Progressing   Problem: Skin Integrity: Goal: Risk for impaired skin integrity will decrease Outcome: Progressing

## 2025-01-14 NOTE — Plan of Care (Signed)

## 2025-01-14 NOTE — Significant Event (Signed)
 Pt was somnolent this AM, though without new focal deficits, so obtained ABG 7.506/27/47/21, was in process of placing him on HFNC when he had an approximately one minute tonic-clonic sz.  He was given Ativan  2mg , but remained post-ictal and required intubation for airway protection.   Leita SAUNDERS Dorianna Mckiver, PA-C

## 2025-01-14 NOTE — Discharge Instructions (Addendum)
Discharge Instructions:  1. You may shower, please wash incisions daily with soap and water and keep dry.  If you wish to cover wounds with dressing you may do so but please keep clean and change daily.  No tub baths or swimming until incisions have completely healed.  If your incisions become red or develop any drainage please call our office at [phone]  2. No Driving until cleared by Dr. Abran Duke office and you are no longer using narcotic pain medications  3. Monitor your weight daily.. Please use the same scale and weigh at same time... If you gain 5-10 lbs in 48 hours with associated lower extremity swelling, please contact our office at [phone]  4. Fever of 101.5 for at least 24 hours with no source, please contact our office at [phone]  5. Activity- up as tolerated, please walk at least 3 times per day.  Avoid strenuous activity, no lifting, pushing, or pulling with your arms over 8-10 lbs for a minimum of 6 weeks  6. If any questions or concerns arise, please do not hesitate to contact our office at [phone]  ------------------------------------------- Information on my medicine - ELIQUIS (apixaban)  Why was Eliquis prescribed for you? Eliquis was prescribed for you to reduce the risk of a blood clot forming that can cause a stroke if you have a medical condition called atrial fibrillation (a type of irregular heartbeat).  What do You need to know about Eliquis ? Take your Eliquis TWICE DAILY - one tablet in the morning and one tablet in the evening with or without food. If you have difficulty swallowing the tablet whole please discuss with your pharmacist how to take the medication safely.  Take Eliquis exactly as prescribed by your doctor and DO NOT stop taking Eliquis without talking to the doctor who prescribed the medication.  Stopping may increase your risk of developing a stroke.  Refill your prescription before you run out.  After discharge, you  should have regular check-up appointments with your healthcare provider that is prescribing your Eliquis.  In the future your dose may need to be changed if your kidney function or weight changes by a significant amount or as you get older.  What do you do if you miss a dose? If you miss a dose, take it as soon as you remember on the same day and resume taking twice daily.  Do not take more than one dose of ELIQUIS at the same time to make up a missed dose.  Important Safety Information A possible side effect of Eliquis is bleeding. You should call your healthcare provider right away if you experience any of the following: ? Bleeding from an injury or your nose that does not stop. ? Unusual colored urine (red or dark brown) or unusual colored stools (red or black). ? Unusual bruising for unknown reasons. ? A serious fall or if you hit your head (even if there is no bleeding).  Some medicines may interact with Eliquis and might increase your risk of bleeding or clotting while on Eliquis. To help avoid this, consult your healthcare provider or pharmacist prior to using any new prescription or non-prescription medications, including herbals, vitamins, non-steroidal anti-inflammatory drugs (NSAIDs) and supplements.  This website has more information on Eliquis (apixaban): http://www.eliquis.com/eliquis/home

## 2025-01-14 NOTE — Progress Notes (Signed)
 Transported patient to MRI while patient was on the mechanical ventilator. Patient remained stable during transport.

## 2025-01-14 NOTE — TOC Initial Note (Signed)
 Transition of Care [facility]) - Initial/Assessment Note    Patient Details  Name: Anthony Paul MRN: [id number] Date of Birth: 1956-07-20  Transition of Care [facility]) CM/SW Contact:    Marval Gell, RN Phone Number: 01/14/2025, 11:41 AM  Clinical Narrative:                  Patient from home w wife.  Admitted for AVR, reintubated after sx this morning. Patient was re assigned to [facility] through office PTA if [facility] services are needed.  ICM will continue to follow.   Expected Discharge Plan: Home w Home Health Services Barriers to Discharge: Continued Medical Work up   Patient Goals and CMS Choice            Expected Discharge Plan and Services   Discharge Planning Services: CM Consult                                 [facility] Agency: [facility]        Prior Living Arrangements/Services                       Activities of Daily Living      Permission Sought/Granted                  Emotional Assessment              Admission diagnosis:  Aortic valve stenosis, etiology of cardiac valve disease unspecified [I35.0] S/P AVR (aortic valve replacement) [Z95.2] Patient Active Problem List   Diagnosis Date Noted   Seizure (HCC) 01/14/2025   S/P AVR (aortic valve replacement) 01/13/2025   History of adenomatous polyp of colon 06/09/2021   Ectatic thoracic aorta (HCC) - CT 09/2020 06/09/2021   Insomnia 06/09/2021   BPH (benign prostatic hyperplasia) 06/09/2021   Aortic atherosclerosis (HCC) - CT 09/2020 10/15/2020   Chronic left shoulder pain 06/08/2018   Chest pain, atypical most c/w ibs  04/13/2018   Cigarette smoker (70+ pack year current smoker) 04/13/2018   Abnormal glucose 03/08/2015   Vitamin D  deficiency 03/08/2015   Medication management 03/08/2015   ED (erectile dysfunction)    DJD (degenerative joint disease)    Bicuspid aortic valve 06/27/2012   Mixed hyperlipidemia 06/27/2012   Borderline systolic HTN  06/27/2012   Congenital insufficiency of aortic valve 06/27/2012   COPD GOLD 0     PCP:  Candise Aleene DEL, MD Pharmacy:   [facility] [phone] - [place], [place] - [address] [place] [address] [place] [address] [place] [address] Phone: [phone] Fax: [phone]     Social Drivers of Health (SDOH) Social History: SDOH Screenings   Food Insecurity: No Food Insecurity (01/01/2025)  Housing: Unknown (01/01/2025)  Transportation Needs: No Transportation Needs (01/01/2025)  Utilities: Not At Risk (01/01/2025)  Depression (PHQ2-9): Low Risk (01/01/2025)  Physical Activity: Inactive (01/01/2025)  Social Connections: Socially Integrated (01/01/2025)  Stress: No Stress Concern Present (01/01/2025)  Tobacco Use: High Risk (01/13/2025)  Health Literacy: Adequate Health Literacy (01/01/2025)   SDOH Interventions:     Readmission Risk Interventions     No data to display

## 2025-01-14 NOTE — Progress Notes (Signed)
 vLTM setup  All impedances below 10k  [facility] to monitor  MRI leads used

## 2025-01-14 NOTE — Progress Notes (Signed)
 NEUROLOGY CONSULT FOLLOW UP NOTE   Date of service: January 14, 2025 Patient Name: Anthony Paul MRN:  [id number] DOB:  [birth year]  Interval Hx/subjective  This morning, the patient had a witnessed tonic-clonic seizure followed by post-ictal state leading to intubation for airway protection. He was administered 2 mg Ativan  and 2000 mg Keppra .   Vitals   Vitals:   01/14/25 0330 01/14/25 0345 01/14/25 0400 01/14/25 0648  BP:      Pulse: 82 81 85   Resp:      Temp: (!) 100.9 F (38.3 C) (!) 100.9 F (38.3 C) (!) 100.9 F (38.3 C)   TempSrc:      SpO2: 94% 93% 92%   Weight:      Height:    6' 1 (1.854 m)     Body mass index is 30.61 kg/m.  Physical Exam   Constitutional: Appears well-developed and well-nourished.  Eyes: No scleral injection.  HENT: Intubated Head: Normocephalic.  Respiratory: Mechanically ventilated Skin: Edema is noted  Neurologic Examination   Mental Status: Sedated on propofol  at a rate of 30. Not opening eyes or following any commands. Will move extremities weakly and nonpurposefully to sustained noxious stimuli  Cranial Nerves: II: Pupils are equal and sluggishly reactive to light. No blink to threat.   III,IV, VI: Eyes are conjugate at the midline without roving EOM. No nystagmus.  V: Weak corneals bilaterally, weaker on the left.  VII: Flaccidly symmetric with no movement to noxious VIII: No response to voice IX,X: Swallows spontaneously XI: Head is midline XII: Unable to assess Motor/Sensory: Decreased tone x 4.  Will move RUE slightly after sustained noxious pinching. Trace movement to LUE, less than on the right.  Will curl toes downwards after sustained noxious plantar stimulation, but no movement at knee or hip joints.  Deep Tendon Reflexes: Hypoactive throughout (sedated with propofol ) Cerebellar: Unable to assess Gait: Unable to assess  Medications Current Medications[1]  Labs and Diagnostic Imaging   CBC:  Recent Labs  Lab  01/13/25 1500 01/13/25 1502 01/14/25 0503 01/14/25 0553 01/14/25 0652 01/14/25 0729  WBC 17.9*  --  13.5*  --   --   --   HGB 10.8*   < > 8.1*   < > 7.8* 7.5*  HCT 31.4*   < > 22.6*   < > 23.0* 22.0*  MCV 94.6  --  92.6  --   --   --   PLT 120*  --  108*  --   --   --    < > = values in this interval not displayed.    Basic Metabolic Panel:  Lab Results  Component Value Date   NA 144 01/14/2025   K 3.5 01/14/2025   CO2 21 (L) 01/14/2025   GLUCOSE 138 (H) 01/14/2025   BUN 18 01/14/2025   CREATININE 1.14 01/14/2025   CALCIUM  8.3 (L) 01/14/2025   GFRNONAA >60 01/14/2025   GFRAA 78 06/09/2021   Lipid Panel:  Lab Results  Component Value Date   LDLCALC 59 11/20/2024   HgbA1c:  Lab Results  Component Value Date   HGBA1C 5.8 (H) 01/09/2025   Urine Drug Screen: No results found for: LABOPIA, COCAINSCRNUR, LABBENZ, AMPHETMU, THCU, LABBARB  Alcohol Level No results found for: [facility] INR  Lab Results  Component Value Date   INR 1.5 (H) 01/13/2025   APTT  Lab Results  Component Value Date   APTT 42 (H) 01/13/2025     Assessment  Anthony LULLA  Paul is a 69 y.o. male with stroke risk factors including hx of preDM2, tobacco use, COPD, severe AS s/p AVR who is currently admitted to the cardiovascular ICU post op. During surgery, air was found in the aortic line and patient was placed in trendelenburg position and air cleared. Post op in the CVICU, noted to have some weakness in his left hand and left foot. Per CCM note, he denied parasthesias, was able to grossly move the LUE and LLE and had no facial droop, but was not able to squeeze with his left hand or wiggle his toes on the left. Neurology was consulted for further evaluation due to concern for possible stroke due to air embolism. On initial exam Monday night, it was difficult to determine if he had unilateral/focal deficits due to poor participation with exam and somnolence. This morning (Tuesday), he was somnolent.  ABG revealed a low pO2 of 47, low PCO2 of 27 and pH of 7.5 with no acid-base deficit. He then had a witnessed tonic-clonic seizure lasting for about 1 minute. Ativan  was administered, but he remained postictal and required intubation for airway protection. Keppra  2000 mg was loaded.  - Exam reveals a sedated patient who does not arouse to noxious stimuli. He does move limbs weakly to sustained noxious stimuli - will move RUE slightly after sustained noxious pinching. Trace movement to LUE, less than on the right. Will curl toes downwards after sustained noxious plantar stimulation, but no movement at knees or hip joints. - Preliminary bedside review of LTM EEG while on propofol  sedation reveals no electrographic seizures.  - CT head (personally reviewed): Cerebral volume within normal limits. No acute intracranial hemorrhage. No visible acute large vessel territory infarct. No mass lesion, midline shift or mass effect. No hydrocephalus or extra-axial fluid collection. - Na and Mg normal. Ionized Ca was normal prior to the seizure. Was hypocapnic and alkalotic on ABG obtained just prior to the seizure. Hypocapnia may be the best explanation regarding the etiology for his seizure. Also on the DDx would be a stroke secondary to air embolization resulting in a new seizure focus - Impression: New onset seizure in the setting of possible stroke secondary to air embolization. Possible decreased seizure threshold secondary to hypocapnia.   Recommendations  - MRI brain with and without contrast can be obtained once the pacer leads are removed, per Cardiothoracic Surgery (discussed by telephone).  - LTM EEG has been ordered - Discontinue Ultram , as this may lower the seizure threshold - Continue Keppra  at 500 mg IV BID, first dose tonight at 8 PM - Inpatient seizure precautions.   ______________________________________________________________________  CRITICAL CARE Performed by: MERRIANNE, Ridgely Anastacio   Total  critical care time: 35 minutes  Critical care time was exclusive of separately billable procedures and treating other patients.  Critical care was necessary to treat or prevent imminent or life-threatening deterioration.  Critical care was time spent personally by me on the following activities: development of treatment plan with patient and/or surrogate as well as nursing, discussions with consultants, evaluation of patient's response to treatment, examination of patient, obtaining history from patient or surrogate, ordering and performing treatments and interventions, ordering and review of laboratory studies, ordering and review of radiographic studies, pulse oximetry and re-evaluation of patient's condition.   Signed, MERRIANNE, Praise Stennett, MD [facility]     [1]  Current Facility-Administered Medications:    0.45 % sodium chloride  infusion, , Intravenous, Continuous PRN, Barrett, Erin R, PA-C   0.9 %  sodium chloride   infusion, 250 mL, Intravenous, Continuous, Barrett, Erin R, PA-C, Last Rate: 1 mL/hr at 01/14/25 0533, 250 mL at 01/14/25 0533   0.9 %  sodium chloride  infusion, , Intravenous, Continuous, Barrett, Erin R, PA-C   acetaminophen  (TYLENOL ) tablet 1,000 mg, 1,000 mg, Oral, Q6H **OR** acetaminophen  (TYLENOL ) 160 MG/5ML solution 1,000 mg, 1,000 mg, Per Tube, Q6H, Lightfoot, Harrell O, MD   acetaminophen  (TYLENOL ) 160 MG/5ML solution 650 mg, 650 mg, Oral, Once, Lightfoot, Harrell O, MD   albuterol  (PROVENTIL ) (2.5 MG/3ML) 0.083% nebulizer solution 3 mL, 3 mL, Inhalation, Q4H PRN, Barrett, Erin R, PA-C   amiodarone  (NEXTERONE  PREMIX) 360-4.14 MG/200ML-% (1.8 mg/mL) IV infusion, 30 mg/hr, Intravenous, Continuous, Lightfoot, Harrell O, MD, Last Rate: 16.67 mL/hr at 01/14/25 0500, 30 mg/hr at 01/14/25 0500   aspirin  chewable tablet 324 mg, 324 mg, Oral, Once, Barrett, Erin R, PA-C   aspirin  EC tablet 325 mg, 325 mg, Oral, Daily **OR** aspirin  chewable tablet 324 mg, 324 mg, Per  Tube, Daily, Barrett, Erin R, PA-C   atorvastatin  (LIPITOR) tablet 40 mg, 40 mg, Oral, QPM, Barrett, Erin R, PA-C   bisacodyl  (DULCOLAX) EC tablet 10 mg, 10 mg, Oral, Daily **OR** bisacodyl  (DULCOLAX) suppository 10 mg, 10 mg, Rectal, Daily, Barrett, Erin R, PA-C   ceFAZolin  (ANCEF ) IVPB 2g/100 mL premix, 2 g, Intravenous, Q8H, Barrett, Erin R, PA-C, Last Rate: 200 mL/hr at 01/14/25 0516, 2 g at 01/14/25 0516   chlorhexidine  (HIBICLENS ) 4 % liquid 2 Application, 30 mL, Topical, UD, Lightfoot, Harrell O, MD   chlorhexidine  (PERIDEX ) 0.12 % solution 15 mL, 15 mL, Mouth/Throat, Once, Lightfoot, Harrell O, MD   Chlorhexidine  Gluconate Cloth 2 % PADS 6 each, 6 each, Topical, Daily, Lightfoot, Linnie KIDD, MD, 6 each at 01/13/25 1833   dextrose  50 % solution 0-50 mL, 0-50 mL, Intravenous, PRN, Barrett, Erin R, PA-C   docusate sodium  (COLACE) capsule 200 mg, 200 mg, Oral, Daily, Barrett, Erin R, PA-C   fentaNYL  (SUBLIMAZE ) injection 12.5-25 mcg, 12.5-25 mcg, Intravenous, Q4H PRN, Smith, Joshua C, NP   insulin  regular, human (MYXREDLIN ) 100 units/ 100 mL infusion, , Intravenous, Continuous, Barrett, Erin R, PA-C, Last Rate: 1.5 mL/hr at 01/14/25 0500, 1.5 Units/hr at 01/14/25 0500   lactated ringers  infusion, , Intravenous, Continuous, Barrett, Erin R, PA-C   lactated ringers  infusion, , Intravenous, Continuous, Barrett, Erin R, PA-C, Stopped at 01/13/25 2139   metoCLOPramide  (REGLAN ) injection 10 mg, 10 mg, Intravenous, Q6H, Barrett, Erin R, PA-C, 10 mg at 01/14/25 9484   metoprolol  tartrate (LOPRESSOR ) tablet 12.5 mg, 12.5 mg, Oral, BID, 12.5 mg at 01/13/25 0900 **OR** metoprolol  tartrate (LOPRESSOR ) 25 mg/10 mL oral suspension 12.5 mg, 12.5 mg, Per Tube, BID, Barrett, Erin R, PA-C   metoprolol  tartrate (LOPRESSOR ) injection 2.5-5 mg, 2.5-5 mg, Intravenous, Q2H PRN, Barrett, Erin R, PA-C   metoprolol  tartrate (LOPRESSOR ) tablet 12.5 mg, 12.5 mg, Oral, Once, Lightfoot, Harrell O, MD   midazolam  PF (VERSED )  injection 2 mg, 2 mg, Intravenous, Q1H PRN, Barrett, Erin R, PA-C, 2 mg at 01/13/25 1539   midazolam  PF (VERSED ) injection 2 mg, 2 mg, Intravenous, Once, Claudene Fonda BROCKS, NP   naloxegol  oxalate (MOVANTIK ) tablet 25 mg, 25 mg, Oral, Daily, Barrett, Erin R, PA-C   norepinephrine  (LEVOPHED ) 4mg  in (0.016 mg/mL) premix infusion, 0-40 mcg/min, Intravenous, Titrated, Clark, Laura P, DO   ondansetron  (ZOFRAN ) injection 4 mg, 4 mg, Intravenous, Q6H PRN, Barrett, Erin R, PA-C   Oral care mouth rinse, 15 mL, Mouth Rinse, PRN, Lightfoot, Harrell O,  MD   oxyCODONE  (Oxy IR/ROXICODONE ) immediate release tablet 5-10 mg, 5-10 mg, Oral, Q3H PRN, Barrett, Erin R, PA-C   [START ON 01/15/2025] pantoprazole  (PROTONIX ) EC tablet 40 mg, 40 mg, Oral, Daily, Barrett, Erin R, PA-C   pantoprazole  (PROTONIX ) injection 40 mg, 40 mg, Intravenous, QHS, Barrett, Erin R, PA-C, 40 mg at 01/13/25 2249   phenylephrine  80 mcg/10 mL injection, , , ,    phenylephrine  80 mcg/10 mL injection, , , ,    potassium chloride  SA (KLOR-CON  M) CR tablet 40 mEq, 40 mEq, Oral, Once, Salam, Savannah B, RPH   propofol  (DIPRIVAN ) 1000 MG/100ML infusion, 0-80 mcg/kg/min, Intravenous, Titrated, Gretta Leita SQUIBB, DO, Last Rate: 18.94 mL/hr at 01/14/25 0656, 30 mcg/kg/min at 01/14/25 0656   sodium chloride  flush (NS) 0.9 % injection 3 mL, 3 mL, Intravenous, Q12H, Barrett, Erin R, PA-C   sodium chloride  flush (NS) 0.9 % injection 3 mL, 3 mL, Intravenous, PRN, Barrett, Erin R, PA-C   traMADol  (ULTRAM ) tablet 50-100 mg, 50-100 mg, Oral, Q4H PRN, Barrett, Erin R, PA-C   traZODone  (DESYREL ) tablet 150 mg, 150 mg, Oral, QHS, Barrett, Erin R, PA-C

## 2025-01-14 NOTE — Progress Notes (Signed)
 Hemodynamically stable in NSR. T100.4.  Remains intubated on vent and sedated.  CT output low  UO ok  Planning MRI this evening.

## 2025-01-14 NOTE — Procedures (Signed)
 Intubation Procedure Note  Anthony Paul  [id number]  08-28-1956  Date:01/14/25  Time:7:32 AM   Provider Performing:Tavarius Grewe R Jaaliyah Lucatero    Procedure: Intubation (31500)  Indication(s) Respiratory Failure  Consent Unable to obtain consent due to emergent nature of procedure.   Anesthesia Versed , Fentanyl , and Rocuronium    Time Out Verified patient identification, verified procedure, site/side was marked, verified correct patient position, special equipment/implants available, medications/allergies/relevant history reviewed, required imaging and test results available.   Sterile Technique Usual hand hygeine, masks, and gloves were used   Procedure Description Patient positioned in bed supine.  Sedation given as noted above.  Patient was intubated with endotracheal tube using Glidescope.  View was Grade 1 full glottis .  Number of attempts was 1.  Colorimetric CO2 detector was consistent with tracheal placement.   Complications/Tolerance None; patient tolerated the procedure well. Chest X-ray is ordered to verify placement.   EBL Minimal   Specimen(s) None   Anthony SAUNDERS Luzelena Heeg, PA-C

## 2025-01-14 NOTE — Progress Notes (Signed)
" ° °   °  [address] [place] [employer] [address]             [phone]                1 Day Post-Op Procedures (LRB): REPLACEMENT, AORTIC VALVE, OPEN UTILIZING 25 MM  INSPIRIS RESILIA  AORTIC VALVE (N/A) ECHOCARDIOGRAM, TRANSESOPHAGEAL, INTRAOPERATIVE (N/A)   Events: Extubated last night. Left hand weakness Seizure this am at 630, and re-intubated for hypoxemia _______________________________________________________________ Vitals: BP 105/83   Pulse 82   Temp (!) 100.9 F (38.3 C)   Resp (!) 24   Ht 6' 1 (1.854 m)   Wt 105.2 kg   SpO2 92%   BMI 30.61 kg/m  Filed Weights   01/13/25 0825  Weight: 105.2 kg     - Neuro: sedated  - Cardiovascular: sinus  Drips: amio, 30, levo 5.   PAP: (16-31)/(-10-12) 28/6 CVP:  [0 mmHg-9 mmHg] 0 mmHg CO:  [3.7 L/min-6.9 L/min] 6.9 L/min CI:  [1.6 L/min/m2-3 L/min/m2] 3 L/min/m2  - Pulm:  Vent Mode: PRVC FiO2 (%):  [40 %-100 %] 100 % Set Rate:  [16 bmp-24 bmp] 24 bmp Vt Set:  [640 mL] 640 mL PEEP:  [5 cmH20-8 cmH20] 8 cmH20 Pressure Support:  [8 cmH20-10 cmH20] 8 cmH20 Plateau Pressure:  [15 cmH20-20 cmH20] 20 cmH20  ABG    Component Value Date/Time   PHART 7.188 (LL) 01/14/2025 0729   PCO2ART 47.3 01/14/2025 0729   PO2ART 125 (H) 01/14/2025 0729   HCO3 17.7 (L) 01/14/2025 0729   TCO2 19 (L) 01/14/2025 0729   ACIDBASEDEF 10.0 (H) 01/14/2025 0729   O2SAT 98 01/14/2025 0729    - Abd: ND - Extremity: trace edema  .Intake/Output      01/26 0701 01/27 0700 01/27 0701 01/28 0700   I.V. (mL/kg) 2593.4 (24.7)    Blood 232    IV Piggyback 2684.6    Total Intake(mL/kg) 5510 (52.4)    Urine (mL/kg/hr) 2985    Chest Tube 670    Total Output 3655    Net +1855            _______________________________________________________________ Labs:    Latest Ref Rng & Units 01/14/2025    7:29 AM 01/14/2025    6:52 AM 01/14/2025    5:53 AM  CBC  Hemoglobin 13.0 - 17.0 g/dL 7.5  7.8  7.8   Hematocrit 39.0 -  52.0 % 22.0  23.0  23.0       Latest Ref Rng & Units 01/14/2025    7:29 AM 01/14/2025    6:52 AM 01/14/2025    5:53 AM  CMP  Sodium 135 - 145 mmol/L 144  145  140   Potassium 3.5 - 5.1 mmol/L 3.5  4.0  3.7     CXR: clear  _______________________________________________________________  Assessment and Plan: POD 1 s/p AVR.  Concern for CVA  Neuro: EEG today.  Loaded with Keppra .  Will obtain MRI CV: wean gtts as tolerated.  Will keep amio for ectopy.  Will remove wires for MRI Pulm: maintain vent support Renal: creat stable GI: NPO for now Heme: stable ID: POD 1 fevers.  Question of aspiration.  CXR clear.   Endo: continue insulin  for now Dispo: continue ICU care   Anthony Paul 01/14/2025 8:31 AM   "

## 2025-01-14 NOTE — Progress Notes (Signed)
 LTM maint complete - no skin breakdown seen.  Serviced P8 P4 [facility] monitored.

## 2025-01-14 NOTE — Progress Notes (Signed)
 "  NAME:  Anthony Paul, MRN:  [id number], DOB:  July 26, 1956, LOS: 1 ADMISSION DATE:  01/13/2025, CONSULTATION DATE:  1/26 REFERRING MD:  Shyrl, CHIEF COMPLAINT:  s/p AVR    History of Present Illness:  69 year old male with past medical history of prediabetes, BPH, hyperlipidemia, tobacco use and COPD, bicuspid aortic valve with severe AS who presents for AVR.   Followed by Dr. Delford with cardiology for murmur since 2014. Found to have bicuspid AV. Had serial echos showing worsening disease. He had RHC 09/2024 showing patent coronaries, normal RHC, calcified bicuspid aortic valve with restricted mobility. Recommending CVTS evaluation. 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 was seen by Dr. Shyrl TCTS 12/27/24 for AVR, now s/p replacement.  Pump time: 102 Xclamp time: 71 EBL: ~ 460 UOP:  950 cc  Products: no blood products   Cell saver: 230 cc   Pertinent  Medical History  prediabetes, BPH, hyperlipidemia, tobacco use and COPD, bicuspid aortic valve with severe AS  Significant Hospital Events: Including procedures, antibiotic start and stop dates in addition to other pertinent events   1/26: s/p AVR , extubated post-op. Had some L sided weakness> head CT ok, Neuro consulted. 1/27 early morning had sudden tonic clonic seizures, sudden shock, required intubation.  Interim History / Subjective:  This morning called to bedside for seizure, intubated, rapid rise in pressors that were quickly titrated down once acidosis was corrected.  Objective   Blood pressure 117/78, pulse 80, temperature (!) 100.6 F (38.1 C), resp. rate (!) 24, height 6' 1 (1.854 m), weight 105.2 kg, SpO2 100%. PAP: (16-40)/(-10-21) 27/17 CVP:  [0 mmHg-11 mmHg] 9 mmHg CO:  [3.7 L/min-6.9 L/min] 6.9 L/min CI:  [1.6 L/min/m2-3 L/min/m2] 3 L/min/m2  Vent Mode: PRVC FiO2 (%):  [40 %-100 %] 40 % Set Rate:  [18 bmp-24 bmp] 24 bmp Vt Set:  [640 mL] 640 mL PEEP:  [5  cmH20-8 cmH20] 8 cmH20 Pressure Support:  [8 cmH20] 8 cmH20 Plateau Pressure:  [20 cmH20] 20 cmH20   Intake/Output Summary (Last 24 hours) at 01/14/2025 1627 Last data filed at 01/14/2025 1500 Gross per 24 hour  Intake 2738.99 ml  Output 2855 ml  Net -116.01 ml   Filed Weights   01/13/25 0825  Weight: 105.2 kg    Examination: General: critically ill appearing man lying in bed in NAD HENT: [place]/AT, eyes anicteric Lungs: breathing comfortably on MV, CTAB. Mild expiratory obstruction on vent. Cardiovascular: S1S2, RRR. Serosanguinous chest tube output. Abdomen: soft, NT Extremities: no significant edema Neuro: RASS -5, swallowing, + cough, breathing over the vent. Withdrawing from pain x 4 extremities.  GU: foley with amber urine  WBC 26 H/H 8/22.5 Platelets 120  7.42/36/171/23  CT head personally reviewed>ETT about 5.5 cm above carina   Resolved Hospital Problem list    Assessment & Plan:  Severe aortic stenosis s/p AVR ; EF 55-60% Bicuspid AV  Frequent PVCS , NSVT Post bypass vasoplegia -post-op management per TCTS -complete post op antibiotics -hold metoprolol  with ongoing pressor requirements -tele monitoring -aspirin , statin -pain control- PAD protocol, has morphine and oxy. D/c tramadol  with seizures -con't chest tubes -pacing wires and swan out for MRI today  New onset seizure, left side weakness-- concern for possible stroke -brain MRI today -cEEG -keppra  load -received midazolam  this morning for intubation post seizure -propofol  preferred sedative -Appreciate Neurology's management -Family had asked about lidocaine  infusion--reviewed literature and with neurology, and this is associated with long-term cognitive decline so  not used. -Neuroprotective measures; aggressively treating fever this morning.  -MAP goal >80 in case of stroke  Acute respiratory failure with hypoxia due to hypoventilation with seizure COPD  Remote tobacco use  -LTVV -VAP  prevention protocol -PAD protocol for sedation -daily SAT & SBT once seizures controlled -add brovana  & yupelri  with obstruction on vent  Hyperglycemia, h/o prediabetes  -con't insulin  gtt  HLD  -con't statin  BPH  -resume tamsulosin  once extubated  Daughter and another family member updated at bedside throughout the morning by myself and PA Gleason.   Labs   CBC: Recent Labs  Lab 01/09/25 1200 01/13/25 1041 01/13/25 1243 01/13/25 1244 01/13/25 1500 01/13/25 1502 01/14/25 0503 01/14/25 0553 01/14/25 0729 01/14/25 0809 01/14/25 1000 01/14/25 1205 01/14/25 1416  WBC 7.0  --   --   --  17.9*  --  13.5*  --   --   --   --   --  26.0*  HGB 14.8   < > 11.5*   < > 10.8*   < > 8.1*   < > 7.5* 7.5* 7.8* 7.8* 8.0*  HCT 42.8   < > 33.0*   < > 31.4*   < > 22.6*   < > 22.0* 22.0* 23.0* 23.0* 22.5*  MCV 92.6  --   --   --  94.6  --  92.6  --   --   --   --   --  93.4  PLT 188  --  147*  --  120*  --  108*  --   --   --   --   --  120*   < > = values in this interval not displayed.    Basic Metabolic Panel: Recent Labs  Lab 01/09/25 1200 01/13/25 1041 01/13/25 1128 01/13/25 1152 01/13/25 1244 01/13/25 1342 01/13/25 1346 01/13/25 1502 01/14/25 0503 01/14/25 0553 01/14/25 0652 01/14/25 0729 01/14/25 0809 01/14/25 1000 01/14/25 1205  NA 140   < > 140 140 139   < > 144   < > 141   < > 145 144 145 145 145  K 4.2   < > 4.1 4.1 4.8   < > 4.3   < > 3.6   < > 4.0 3.5 3.4* 3.3* 3.4*  CL 104   < > 105 101 106  --  102  --  108  --   --   --   --   --   --   CO2 26  --   --   --   --   --   --   --  21*  --   --   --   --   --   --   GLUCOSE 121*   < > 129* 132* 135*  --  140*  --  138*  --   --   --   --   --   --   BUN 17   < > 17 15 16   --  15  --  18  --   --   --   --   --   --   CREATININE 1.10   < > 1.10 1.00 1.10  --  1.00  --  1.14  --   --   --   --   --   --   CALCIUM  9.4  --   --   --   --   --   --   --  8.3*  --   --   --   --   --   --   MG  --   --   --   --    --   --   --   --  2.3  --   --   --   --   --   --    < > = values in this interval not displayed.   GFR: Estimated Creatinine Clearance: 77.9 mL/min (by C-G formula based on SCr of 1.14 mg/dL). Recent Labs  Lab 01/09/25 1200 01/13/25 1500 01/14/25 0503 01/14/25 1416  WBC 7.0 17.9* 13.5* 26.0*    Liver Function Tests: Recent Labs  Lab 01/09/25 1200  AST 19  ALT 21  ALKPHOS 59  BILITOT 0.3  PROT 6.4*  ALBUMIN  4.1       Critical care time:       This patient is critically ill with multiple organ system failure which requires frequent high complexity decision making, assessment, support, evaluation, and titration of therapies. This was completed through the application of advanced monitoring technologies and extensive interpretation of multiple databases. During this encounter critical care time was devoted to patient care services described in this note for 80 minutes.  Leita SHAUNNA Gaskins, DO 01/14/25 4:43 PM [facility]  For contact information, see Amion. If no response to pager, please call PCCM 2H APP. After hours, 7PM- 7AM, please call on call APP for 2H.   "

## 2025-01-14 NOTE — Progress Notes (Signed)
 Called to bedside post seizure for intubation.  MAP in 50s, gave 3 x 160mcg Neosynephrine boluses. Was on Neosynephrine 53mcg> escalated to 30. Started norepi, had to rapidly titrate per my verbal orders at bedside for poor response to escalating doses and boluses of Neosynephrine. Max dose was 30.  Versed  4mg  total, rocuronium  100mg  given for intubation- see separate note. I directly supervised.  No desaturation events.   Immediate post intubation ABG with pH 6.9, bicarb 10, PCO2 in 50s.  Rate on vent increased to 24.  3 amps bicarb pushed with rapid improvement in BP allowing for discontinuation of both vasopressor infusions.  Propofol  started at 20> escalated to 30 with hypertension.  Brain MRI.  Neurology aware, keppra  ordered.

## 2025-01-15 ENCOUNTER — Inpatient Hospital Stay (HOSPITAL_COMMUNITY)

## 2025-01-15 DIAGNOSIS — N400 Enlarged prostate without lower urinary tract symptoms: Secondary | ICD-10-CM | POA: Diagnosis not present

## 2025-01-15 DIAGNOSIS — Z48812 Encounter for surgical aftercare following surgery on the circulatory system: Secondary | ICD-10-CM | POA: Diagnosis not present

## 2025-01-15 DIAGNOSIS — T790XXA Air embolism (traumatic), initial encounter: Secondary | ICD-10-CM | POA: Diagnosis not present

## 2025-01-15 DIAGNOSIS — Q2381 Bicuspid aortic valve: Secondary | ICD-10-CM | POA: Diagnosis not present

## 2025-01-15 DIAGNOSIS — J9601 Acute respiratory failure with hypoxia: Secondary | ICD-10-CM | POA: Diagnosis not present

## 2025-01-15 DIAGNOSIS — I6350 Cerebral infarction due to unspecified occlusion or stenosis of unspecified cerebral artery: Secondary | ICD-10-CM | POA: Diagnosis not present

## 2025-01-15 DIAGNOSIS — G4089 Other seizures: Secondary | ICD-10-CM | POA: Diagnosis not present

## 2025-01-15 DIAGNOSIS — I493 Ventricular premature depolarization: Secondary | ICD-10-CM | POA: Diagnosis not present

## 2025-01-15 DIAGNOSIS — G40909 Epilepsy, unspecified, not intractable, without status epilepticus: Secondary | ICD-10-CM | POA: Diagnosis not present

## 2025-01-15 DIAGNOSIS — Z952 Presence of prosthetic heart valve: Secondary | ICD-10-CM | POA: Diagnosis not present

## 2025-01-15 DIAGNOSIS — I350 Nonrheumatic aortic (valve) stenosis: Secondary | ICD-10-CM | POA: Diagnosis not present

## 2025-01-15 DIAGNOSIS — Z48813 Encounter for surgical aftercare following surgery on the respiratory system: Secondary | ICD-10-CM | POA: Diagnosis not present

## 2025-01-15 DIAGNOSIS — R739 Hyperglycemia, unspecified: Secondary | ICD-10-CM | POA: Diagnosis not present

## 2025-01-15 DIAGNOSIS — J449 Chronic obstructive pulmonary disease, unspecified: Secondary | ICD-10-CM | POA: Diagnosis not present

## 2025-01-15 DIAGNOSIS — Z720 Tobacco use: Secondary | ICD-10-CM | POA: Diagnosis not present

## 2025-01-15 DIAGNOSIS — E785 Hyperlipidemia, unspecified: Secondary | ICD-10-CM | POA: Diagnosis not present

## 2025-01-15 DIAGNOSIS — R569 Unspecified convulsions: Secondary | ICD-10-CM | POA: Diagnosis not present

## 2025-01-15 LAB — POCT I-STAT EG7
Acid-Base Excess: 2 mmol/L (ref 0.0–2.0)
Bicarbonate: 26.5 mmol/L (ref 20.0–28.0)
Calcium, Ion: 1.1 mmol/L — ABNORMAL LOW (ref 1.15–1.40)
HCT: 21 % — ABNORMAL LOW (ref 39.0–52.0)
Hemoglobin: 7.1 g/dL — ABNORMAL LOW (ref 13.0–17.0)
O2 Saturation: 59 %
Patient temperature: 99.3
Potassium: 3.6 mmol/L (ref 3.5–5.1)
Sodium: 141 mmol/L (ref 135–145)
TCO2: 28 mmol/L (ref 22–32)
pCO2, Ven: 41.1 mmHg — ABNORMAL LOW (ref 44–60)
pH, Ven: 7.42 (ref 7.25–7.43)
pO2, Ven: 31 mmHg — CL (ref 32–45)

## 2025-01-15 LAB — GLUCOSE, CAPILLARY
Glucose-Capillary: 156 mg/dL — ABNORMAL HIGH (ref 70–99)
Glucose-Capillary: 138 mg/dL — ABNORMAL HIGH (ref 70–99)
Glucose-Capillary: 105 mg/dL — ABNORMAL HIGH (ref 70–99)
Glucose-Capillary: 95 mg/dL (ref 70–99)
Glucose-Capillary: 132 mg/dL — ABNORMAL HIGH (ref 70–99)
Glucose-Capillary: 105 mg/dL — ABNORMAL HIGH (ref 70–99)
Glucose-Capillary: 112 mg/dL — ABNORMAL HIGH (ref 70–99)
Glucose-Capillary: 133 mg/dL — ABNORMAL HIGH (ref 70–99)
Glucose-Capillary: 112 mg/dL — ABNORMAL HIGH (ref 70–99)
Glucose-Capillary: 118 mg/dL — ABNORMAL HIGH (ref 70–99)

## 2025-01-15 LAB — BASIC METABOLIC PANEL WITH GFR
Anion gap: 10 (ref 5–15)
BUN: 20 mg/dL (ref 8–23)
CO2: 26 mmol/L (ref 22–32)
Calcium: 7.9 mg/dL — ABNORMAL LOW (ref 8.9–10.3)
Chloride: 106 mmol/L (ref 98–111)
Creatinine, Ser: 1.36 mg/dL — ABNORMAL HIGH (ref 0.61–1.24)
GFR, Estimated: 56 mL/min — ABNORMAL LOW
Glucose, Bld: 125 mg/dL — ABNORMAL HIGH (ref 70–99)
Potassium: 3.9 mmol/L (ref 3.5–5.1)
Sodium: 142 mmol/L (ref 135–145)

## 2025-01-15 LAB — MAGNESIUM: Magnesium: 2.5 mg/dL — ABNORMAL HIGH (ref 1.7–2.4)

## 2025-01-15 LAB — CBC
HCT: 21.3 % — ABNORMAL LOW (ref 39.0–52.0)
Hemoglobin: 7.7 g/dL — ABNORMAL LOW (ref 13.0–17.0)
MCH: 34.1 pg — ABNORMAL HIGH (ref 26.0–34.0)
MCHC: 36.2 g/dL — ABNORMAL HIGH (ref 30.0–36.0)
MCV: 94.2 fL (ref 80.0–100.0)
Platelets: 88 10*3/uL — ABNORMAL LOW (ref 150–400)
RBC: 2.26 MIL/uL — ABNORMAL LOW (ref 4.22–5.81)
RDW: 13.2 % (ref 11.5–15.5)
WBC: 21 10*3/uL — ABNORMAL HIGH (ref 4.0–10.5)
nRBC: 0 % (ref 0.0–0.2)

## 2025-01-15 LAB — PHOSPHORUS: Phosphorus: 2.5 mg/dL (ref 2.5–4.6)

## 2025-01-15 LAB — TRIGLYCERIDES: Triglycerides: 167 mg/dL — ABNORMAL HIGH

## 2025-01-15 MED ORDER — ATORVASTATIN CALCIUM 40 MG PO TABS
40.0000 mg | ORAL_TABLET | Freq: Every evening | ORAL | Status: DC
  Administered 2025-01-15 – 2025-01-22 (×8): 40 mg via ORAL
  Filled 2025-01-15 (×8): qty 1

## 2025-01-15 MED ORDER — ENSURE PLUS HIGH PROTEIN PO LIQD: 237.0000 mL | Freq: Two times a day (BID) | ORAL | Status: DC

## 2025-01-15 MED ORDER — AMIODARONE IV BOLUS ONLY 150 MG/100ML
150.0000 mg | Freq: Once | INTRAVENOUS | Status: AC
  Administered 2025-01-15: 150 mg via INTRAVENOUS

## 2025-01-15 MED ORDER — OXYCODONE HCL 5 MG PO TABS
5.0000 mg | ORAL_TABLET | Freq: Once | ORAL | Status: AC
  Administered 2025-01-15: 5 mg via ORAL
  Filled 2025-01-15: qty 1

## 2025-01-15 MED ORDER — NALOXEGOL OXALATE 25 MG PO TABS
25.0000 mg | ORAL_TABLET | Freq: Every day | ORAL | Status: DC
  Administered 2025-01-15 – 2025-01-23 (×9): 25 mg via ORAL
  Filled 2025-01-15 (×9): qty 1

## 2025-01-15 MED ORDER — INSULIN ASPART 100 UNIT/ML IJ SOLN
0.0000 [IU] | INTRAMUSCULAR | Status: DC
  Administered 2025-01-15 – 2025-01-16 (×4): 2 [IU] via SUBCUTANEOUS
  Filled 2025-01-15 (×4): qty 2

## 2025-01-15 MED ORDER — OXYCODONE HCL 5 MG PO TABS
5.0000 mg | ORAL_TABLET | ORAL | Status: DC | PRN
  Administered 2025-01-15 – 2025-01-16 (×4): 10 mg via ORAL
  Administered 2025-01-18: 5 mg via ORAL
  Administered 2025-01-19 (×3): 10 mg via ORAL
  Administered 2025-01-19: 5 mg via ORAL
  Filled 2025-01-15 (×6): qty 2
  Filled 2025-01-15: qty 1
  Filled 2025-01-15 (×2): qty 2

## 2025-01-15 MED ORDER — ORAL CARE MOUTH RINSE
15.0000 mL | Freq: Four times a day (QID) | OROMUCOSAL | Status: DC
  Administered 2025-01-15 – 2025-01-23 (×18): 15 mL via OROMUCOSAL

## 2025-01-15 MED ORDER — ENOXAPARIN SODIUM 40 MG/0.4ML IJ SOSY
40.0000 mg | PREFILLED_SYRINGE | Freq: Every day | INTRAMUSCULAR | Status: DC
  Administered 2025-01-15 – 2025-01-16 (×2): 40 mg via SUBCUTANEOUS
  Filled 2025-01-15 (×2): qty 0.4

## 2025-01-15 MED ORDER — DOCUSATE SODIUM 100 MG PO CAPS
200.0000 mg | ORAL_CAPSULE | Freq: Every day | ORAL | Status: DC
  Administered 2025-01-15 – 2025-01-18 (×3): 200 mg via ORAL
  Filled 2025-01-15 (×4): qty 2

## 2025-01-15 MED ORDER — OXYCODONE HCL 5 MG PO TABS
10.0000 mg | ORAL_TABLET | Freq: Three times a day (TID) | ORAL | Status: DC
  Administered 2025-01-15 – 2025-01-20 (×14): 10 mg via ORAL
  Filled 2025-01-15 (×14): qty 2

## 2025-01-15 MED ORDER — MAGNESIUM SULFATE 2 GM/50ML IV SOLN
2.0000 g | Freq: Once | INTRAVENOUS | Status: AC
  Administered 2025-01-15: 2 g via INTRAVENOUS
  Filled 2025-01-15: qty 50

## 2025-01-15 MED ORDER — ICOSAPENT ETHYL 1 G PO CAPS
2.0000 g | ORAL_CAPSULE | Freq: Two times a day (BID) | ORAL | Status: DC
  Administered 2025-01-15 – 2025-01-23 (×16): 2 g via ORAL
  Filled 2025-01-15 (×18): qty 2

## 2025-01-15 MED ORDER — POTASSIUM CHLORIDE 20 MEQ PO PACK
20.0000 meq | PACK | Freq: Once | ORAL | Status: AC
  Administered 2025-01-15: 20 meq via ORAL
  Filled 2025-01-15: qty 1

## 2025-01-15 MED ORDER — OXYCODONE HCL 5 MG PO TABS
5.0000 mg | ORAL_TABLET | Freq: Four times a day (QID) | ORAL | Status: DC
  Administered 2025-01-15: 5 mg via ORAL
  Filled 2025-01-15: qty 1

## 2025-01-15 MED ORDER — FUROSEMIDE 10 MG/ML IJ SOLN
40.0000 mg | Freq: Once | INTRAMUSCULAR | Status: AC
  Administered 2025-01-15: 40 mg via INTRAVENOUS
  Filled 2025-01-15: qty 4

## 2025-01-15 MED FILL — Heparin Sodium (Porcine) Inj 1000 Unit/ML: Qty: 1000 | Status: AC

## 2025-01-15 MED FILL — Lidocaine HCl Local Preservative Free (PF) Inj 2%: INTRAMUSCULAR | Qty: 14 | Status: AC

## 2025-01-15 MED FILL — Mannitol IV Soln 20%: INTRAVENOUS | Qty: 500 | Status: AC

## 2025-01-15 MED FILL — Sodium Bicarbonate IV Soln 8.4%: INTRAVENOUS | Qty: 50 | Status: AC

## 2025-01-15 MED FILL — Electrolyte-R (PH 7.4) Solution: INTRAVENOUS | Qty: 3000 | Status: AC

## 2025-01-15 MED FILL — Calcium Chloride Inj 10%: INTRAVENOUS | Qty: 10 | Status: AC

## 2025-01-15 MED FILL — Sodium Chloride Irrigation Soln 0.9%: Qty: 6000 | Status: AC

## 2025-01-15 MED FILL — henylephrine-NaCl Pref Syr 0.8 MG/10ML-0.9% (80 MCG/ML): INTRAVENOUS | Qty: 30 | Status: AC

## 2025-01-15 MED FILL — Heparin Sodium (Porcine) Inj 1000 Unit/ML: INTRAMUSCULAR | Qty: 30 | Status: AC

## 2025-01-15 MED FILL — Potassium Chloride Inj 2 mEq/ML: INTRAVENOUS | Qty: 40 | Status: AC

## 2025-01-15 MED FILL — Heparin Sodium (Porcine) Inj 1000 Unit/ML: INTRAMUSCULAR | Qty: 20 | Status: AC

## 2025-01-15 NOTE — Progress Notes (Signed)
" ° °   °  [address] [employer] [address]             [phone]                2 Days Post-Op Procedures (LRB): REPLACEMENT, AORTIC VALVE, OPEN UTILIZING 25 MM  INSPIRIS RESILIA  AORTIC VALVE (N/A) ECHOCARDIOGRAM, TRANSESOPHAGEAL, INTRAOPERATIVE (N/A)   Events: Extubated this am Following command _______________________________________________________________ Vitals: BP 114/75   Pulse (!) 133   Temp 100.2 F (37.9 C)   Resp (!) 24   Ht 6' 1 (1.854 m)   Wt 104.3 kg   SpO2 100%   BMI 30.34 kg/m  Filed Weights   01/13/25 0825 01/15/25 0500  Weight: 105.2 kg 104.3 kg     - Neuro: drowsy, nonfocal  - Cardiovascular: sinus  Drips: amio, 30, levo 7.   PAP: (27-34)/(16-21) 27/17 CVP:  [9 mmHg-11 mmHg] 9 mmHg  - Pulm: EWOB    ABG    Component Value Date/Time   PHART 7.424 01/14/2025 1205   PCO2ART 36.4 01/14/2025 1205   PO2ART 171 (H) 01/14/2025 1205   HCO3 26.5 01/15/2025 0851   TCO2 28 01/15/2025 0851   ACIDBASEDEF 1.0 01/14/2025 0809   O2SAT 59 01/15/2025 0851    - Abd: ND - Extremity: trace edema  .Intake/Output      01/27 0701 01/28 0700 01/28 0701 01/29 0700   I.V. (mL/kg) 1823.7 (17.5) 721.4 (6.9)   Blood     IV Piggyback 520.3 5   Total Intake(mL/kg) 2344 (22.5) 726.4 (7)   Urine (mL/kg/hr) 1385 (0.6) 100 (0.2)   Chest Tube 430    Total Output 1815 100   Net +529 +626.4        Urine Occurrence  1 x      _______________________________________________________________ Labs:    Latest Ref Rng & Units 01/15/2025    8:51 AM 01/15/2025    5:42 AM 01/14/2025    2:16 PM  CBC  WBC 4.0 - 10.5 K/uL  21.0  26.0   Hemoglobin 13.0 - 17.0 g/dL 7.1  7.7  8.0   Hematocrit 39.0 - 52.0 % 21.0  21.3  22.5   Platelets 150 - 400 K/uL  88  120       Latest Ref Rng & Units 01/15/2025    8:51 AM 01/15/2025    5:42 AM 01/14/2025    3:58 PM  CMP  Glucose 70 - 99 mg/dL  874  853   BUN 8 - 23 mg/dL  20  21   Creatinine 9.38 - 1.24  mg/dL  8.63  8.62   Sodium 864 - 145 mmol/L 141  142  141   Potassium 3.5 - 5.1 mmol/L 3.6  3.9  3.7   Chloride 98 - 111 mmol/L  106  106   CO2 22 - 32 mmol/L  26  26   Calcium  8.9 - 10.3 mg/dL  7.9  8.2     CXR:   _______________________________________________________________  Assessment and Plan: POD 2 s/p AVR.  Small L temporal CVA  Neuro: appreciate neuro.  Will keep Keppra  on board.  PT/OT CV: afib this am.  Remains on amio.  Will bolus Pulm: IS, ambulation Renal: will diurese once off levo GI: ok for diet Heme: stable ID: will continue to watch fevers Endo: SSI Dispo: continue ICU care   Anthony Paul 01/15/2025 11:14 AM   "

## 2025-01-15 NOTE — Evaluation (Signed)
 Physical Therapy Evaluation Patient Details Name: Anthony Paul MRN: [id number] DOB: 23-Aug-1956 Today's Date: 01/15/2025  History of Present Illness  Pt is 69 yo presenting to [facility] on 1/26 for scheduled surgery. Pt currently s/p AVR on 1/26; extubated 1/27 then had sudden tonic clonic seizures and required re-intubation. Pt extubated 1/29. Possible small CVA PMH: ascending aortia dilatation, bicuspid aortic valve, BPH, chronic abdominal pain, BPH, COPD, DJD, insomnia, lumbar spondylosis, mixed hyperlipidemia, prediabetes, Vit B12  Clinical Impression  Currently pt is Mod to Mod A +2 for bed mobility, 2 person mod A for transfer from EOB <> BSC. Pt has difficulty remembering sternal precautions and requires reminders throughout. Pt has good family support 24/7. Due to pt current functional status, home set up and available assistance at home recommending skilled physical therapy services > 3 hours/day in order to address strength, balance and functional mobility to decrease risk for falls, injury, immobility, skin break down and re-hospitalization.          If plan is discharge home, recommend the following: A little help with walking and/or transfers;Assistance with cooking/housework;Assist for transportation;Help with stairs or ramp for entrance     Equipment Recommendations Rolling walker (2 wheels);BSC/3in1;Hospital bed  Recommendations for Other Services  Rehab consult    Functional Status Assessment Patient has had a recent decline in their functional status and demonstrates the ability to make significant improvements in function in a reasonable and predictable amount of time.     Precautions / Restrictions Precautions Precautions: Fall;Sternal Precaution Booklet Issued: No Recall of Precautions/Restrictions: Impaired Restrictions Weight Bearing Restrictions Per Provider Order: Yes RUE Weight Bearing Per Provider Order: Non weight bearing LUE Weight Bearing Per Provider Order: Non  weight bearing      Mobility  Bed Mobility Overal bed mobility: Needs Assistance Bed Mobility: Supine to Sit, Sit to Supine     Supine to sit: Mod assist Sit to supine: Mod assist, +2 for safety/equipment, +2 for physical assistance   General bed mobility comments: heavy cues to maintain sternal precautions, Mod A for trunk to midline and cues to initiate movement of LE to EOB. Mod A for trunk and LE for getting back to supine    Transfers Overall transfer level: Needs assistance Equipment used: None Transfers: Sit to/from Stand, Bed to chair/wheelchair/BSC Sit to Stand: Mod assist, +2 safety/equipment, +2 physical assistance   Step pivot transfers: Mod assist, +2 physical assistance, +2 safety/equipment       General transfer comment: Mod A +2 for sit to stand with assist transfering weight in order to step with step by step instructions and heavy cues to maintain sternal precautions throughout.    Ambulation/Gait             Pre-gait activities: Pt was able to take steps from EOB to [facility] with heavy mod A for wgt shifting and step by step cues for sequencing and side stepping at EOB.     Balance Overall balance assessment: Needs assistance Sitting-balance support: Bilateral upper extremity supported, No upper extremity supported, Feet supported Sitting balance-Leahy Scale: Fair     Standing balance support: Bilateral upper extremity supported, During functional activity Standing balance-Leahy Scale: Poor Standing balance comment: Min to Mod A for balance in standing.       Pertinent Vitals/Pain Pain Assessment Pain Assessment: 0-10 Pain Score: 10-Worst pain ever Pain Location: sternum Pain Descriptors / Indicators: Discomfort Pain Intervention(s): Monitored during session, Repositioned    Home Living Family/patient expects to be discharged to:: Private  residence Living Arrangements: Spouse/significant other Available Help at Discharge: Family;Available 24  hours/day Type of Home: House Home Access: Stairs to enter Entrance Stairs-Rails: None Entrance Stairs-Number of Steps: 2   Home Layout: One level Home Equipment: Shower seat - built in      Prior Function Prior Level of Function : Independent/Modified Independent;Driving             Mobility Comments: denies falls or use of AD ADLs Comments: ind with ADL's and IADL's.     Extremity/Trunk Assessment   Upper Extremity Assessment Upper Extremity Assessment: Defer to OT evaluation    Lower Extremity Assessment Lower Extremity Assessment: Generalized weakness    Cervical / Trunk Assessment Cervical / Trunk Assessment: Normal;Other exceptions Cervical / Trunk Exceptions: recent AVR  Communication   Communication Communication: No apparent difficulties    Cognition Arousal: Alert Behavior During Therapy: WFL for tasks assessed/performed   PT - Cognitive impairments: Safety/Judgement, Sequencing, Memory     PT - Cognition Comments: pt with significant difficulty remembering sternal precautions Following commands: Intact       Cueing Cueing Techniques: Verbal cues     General Comments General comments (skin integrity, edema, etc.): incision C/D/I        Assessment/Plan    PT Assessment Patient needs continued PT services  PT Problem List Decreased strength;Decreased activity tolerance;Decreased balance;Decreased mobility;Pain;Decreased safety awareness;Decreased knowledge of precautions       PT Treatment Interventions DME instruction;Balance training;Gait training;Stair training;Functional mobility training;Patient/family education;Therapeutic activities;Therapeutic exercise    PT Goals (Current goals can be found in the Care Plan section)  Acute Rehab PT Goals Patient Stated Goal: improve mobility PT Goal Formulation: With patient Time For Goal Achievement: 01/29/25 Potential to Achieve Goals: Good    Frequency Min 2X/week        AM-PAC PT 6  Clicks Mobility  Outcome Measure Help needed turning from your back to your side while in a flat bed without using bedrails?: A Lot Help needed moving from lying on your back to sitting on the side of a flat bed without using bedrails?: A Lot Help needed moving to and from a bed to a chair (including a wheelchair)?: A Lot Help needed standing up from a chair using your arms (e.g., wheelchair or bedside chair)?: A Lot Help needed to walk in hospital room?: Total Help needed climbing 3-5 steps with a railing? : Total 6 Click Score: 10    End of Session Equipment Utilized During Treatment: Oxygen Activity Tolerance: Patient tolerated treatment well Patient left: in bed;with call bell/phone within reach;with nursing/sitter in room Nurse Communication: Mobility status PT Visit Diagnosis: Unsteadiness on feet (R26.81);Muscle weakness (generalized) (M62.81);Other abnormalities of gait and mobility (R26.89);Pain Pain - part of body:  (sternum)    Time: 8383-8349 PT Time Calculation (min) (ACUTE ONLY): 34 min   Charges:   PT Evaluation $PT Eval Low Complexity: 1 Low PT Treatments $Therapeutic Activity: 8-22 mins PT General Charges $$ ACUTE PT VISIT: 1 Visit         Dorothyann Maier, DPT, CLT  Acute Rehabilitation Services Office: [phone] (Secure chat preferred)   Dorothyann VEAR Maier 01/15/2025, 5:23 PM

## 2025-01-15 NOTE — Evaluation (Signed)
 Clinical/Bedside Swallow Evaluation Patient Details  Name: Anthony Paul MRN: [id number] Date of Birth: 02/26/1956  Today's Date: 01/15/2025 Time: SLP Start Time (ACUTE ONLY): 1105 SLP Stop Time (ACUTE ONLY): 1128 SLP Time Calculation (min) (ACUTE ONLY): 23 min  Past Medical History:  Past Medical History:  Diagnosis Date   Abnormal CT scan, chest    Blebs on CT chest but NORMAL PFTs- no COPD   Aortic stenosis    severe 2025--->pt to get tissue valve replacement 2026 (Dr. Kerrin)   Ascending aorta dilatation    needs annual CTA (Dr. Delford)   Bicuspid aortic valve    Echo 2014  EF 55-60% Mild to moderate AR mild AS with mean gradient 18 mmHg and peak of 25 mmHg. ? Bicuspid valve with dilated aortic root 4.0 cm.   BPH with obstruction/lower urinary tract symptoms    Chronic abdominal pain    Atypical features/unknown etiology.  Followed by [facility] pain clinic.   COPD (chronic obstructive pulmonary disease) (HCC)    diverticulosis    hx diverticulitis and partial colon resection   DJD (degenerative joint disease)    ED (erectile dysfunction)    Elevated blood pressure reading without diagnosis of hypertension    Heart murmur    Hx of colonic polyp 2015   Insomnia    Lumbar spondylosis    Mixed hyperlipidemia    Prediabetes    Tobacco abuse    Vitamin B12 deficiency    Past Surgical History:  Past Surgical History:  Procedure Laterality Date   AORTIC VALVE REPLACEMENT N/A 01/13/2025   Procedure: REPLACEMENT, AORTIC VALVE, OPEN UTILIZING 25 MM  INSPIRIS RESILIA  AORTIC VALVE;  Surgeon: Shyrl Linnie KIDD, MD;  Location: [facility] OR;  Service: Open Heart Surgery;  Laterality: N/A;   CARDIAC CATHETERIZATION     09/2024 no signif CAD.   Carotid dopplers     03/2024 normal   COLON SURGERY  12/2010   For severe diverticulitis, 8 inches of colon was resected per patient report   COLONOSCOPY  01/13/2014   hx multiple adenomas.  Most recent 01/14/20->1 polyp->needs q5 yr colonoscopy  (Dr. Leigh)   HEMORROIDECTOMY     INGUINAL HERNIA REPAIR Right 2015   Dr. Camellia Blush   INTRAOPERATIVE TRANSESOPHAGEAL ECHOCARDIOGRAM N/A 01/13/2025   Procedure: ECHOCARDIOGRAM, TRANSESOPHAGEAL, INTRAOPERATIVE;  Surgeon: Shyrl Linnie KIDD, MD;  Location: [facility] OR;  Service: Open Heart Surgery;  Laterality: N/A;   RESECTION DISTAL CLAVICAL Right    RIGHT HEART CATH AND CORONARY ANGIOGRAPHY N/A 09/20/2024   Procedure: RIGHT HEART CATH AND CORONARY ANGIOGRAPHY;  Surgeon: Wonda Sharper, MD;  Location: [facility] INVASIVE CV LAB;  Service: Cardiovascular;  Laterality: N/A;   TONSILLECTOMY     TRANSTHORACIC ECHOCARDIOGRAM     03/2024, AS a bit worse (mod-to-severe), known bicuspid valve moderate AR.  08/2024 AS severe, aortic dilatation to 44 mm, grd II DD, otherwise normal.   UPPER GASTROINTESTINAL ENDOSCOPY     UPPER GI ENDOSCOPY  2015   Dr. Luis   HPI:  69 year old male with past medical history of prediabetes, BPH, hyperlipidemia, tobacco use and COPD, bicuspid aortic valve with severe AS who presents for AVR 1/26. 1/27 had a sudden tonic clonic seizure requiring intubation. MRI 1/27 with Patchy small volume restricted diffusion involving the posterior left temporoccipital region, favored to reflect a small acute ischemic infarct.    Assessment / Plan / Recommendation  Clinical Impression  Patient presents with a functional oropharyngeal swallow. Mastication of solids prolonged, oddly increased with  pureed solids vs regular textures, but functional and with full oral clearance. Patient with suspected delayed swallow initiation and possibly mild pharyngeal residuals with thin liquids given observation of rapid multiple swallows per bolus however no overt s/s of aspiration observed across consistencies. Vocal qualitiy moderately hoarse indicative of decreased glottal closure s/p short intubation. This increases risk of aspiration however at this time patient judged safe to initiate a po diet, meds  whole in puree to decrease aspiration risk with mixed consistencies. Prognosis for rapid return to fully normal swallow good given no focal deficits from CVA observed and intubation brief. Will f/u x 1 next date for tolerance. SLP Visit Diagnosis: Dysphagia, unspecified (R13.10)    Aspiration Risk  Mild aspiration risk       Other Recommendations Oral Care Recommendations: Oral care BID     Swallow Evaluation Recommendations Recommendations: PO diet PO Diet Recommendation: Regular;Thin liquids (Level 0) Liquid Administration via: Cup;Straw Medication Administration: Whole meds with puree Supervision: Patient able to self-feed;Intermittent supervision/cueing for swallowing strategies Swallowing strategies  : Slow rate;Small bites/sips Postural changes: Position pt fully upright for meals Oral care recommendations: Oral care BID (2x/day)   Assistance Recommended at Discharge    Functional Status Assessment Patient has had a recent decline in their functional status and demonstrates the ability to make significant improvements in function in a reasonable and predictable amount of time.  Frequency and Duration min 1 x/week  1 week       Prognosis Prognosis for improved oropharyngeal function: Good      Swallow Study   General HPI: 69 year old male with past medical history of prediabetes, BPH, hyperlipidemia, tobacco use and COPD, bicuspid aortic valve with severe AS who presents for AVR 1/26. 1/27 had a sudden tonic clonic seizure requiring intubation. MRI 1/27 with Patchy small volume restricted diffusion involving the posterior left temporoccipital region, favored to reflect a small acute ischemic infarct. Type of Study: Bedside Swallow Evaluation Previous Swallow Assessment: none Diet Prior to this Study: NPO Temperature Spikes Noted: Yes Respiratory Status: Room air History of Recent Intubation: Yes Total duration of intubation (days): 1 days Date extubated:  01/15/25 Behavior/Cognition: Alert;Cooperative;Pleasant mood;Other (Comment) (midly confused, decreased attention) Oral Cavity Assessment: Within Functional Limits Oral Care Completed by SLP: Recent completion by staff Oral Cavity - Dentition: Adequate natural dentition Vision: Functional for self-feeding Self-Feeding Abilities: Able to feed self Patient Positioning: Upright in bed Baseline Vocal Quality: Hoarse Volitional Cough: Weak (pain with coughing) Volitional Swallow: Able to elicit    Oral/Motor/Sensory Function Overall Oral Motor/Sensory Function: Within functional limits   Ice Chips Ice chips: Within functional limits Presentation: Spoon   Thin Liquid Thin Liquid: Impaired Presentation: Cup;Straw Pharyngeal  Phase Impairments: Suspected delayed Swallow;Multiple swallows    Nectar Thick Nectar Thick Liquid: Not tested   Honey Thick Honey Thick Liquid: Not tested   Puree Puree: Impaired Presentation: Spoon Oral Phase Impairments: Impaired mastication Oral Phase Functional Implications: Prolonged oral transit (increased mastication with purees)   Solid     Solid: Within functional limits Presentation: Self Fed     Duaine Radin MA, CCC-SLP  Crystle Carelli Meryl 01/15/2025,12:14 PM

## 2025-01-15 NOTE — Progress Notes (Signed)
 NEUROLOGY CONSULT FOLLOW UP NOTE   Date of service: January 15, 2025 Patient Name: Anthony Paul MRN:  [id number] DOB:  [birth year]  Interval Hx/subjective  Participating in therapy today.   Vitals   Vitals:   01/15/25 1000 01/15/25 1015 01/15/25 1030 01/15/25 1045  BP: 111/74 111/74 118/78 112/71  Pulse: (!) 123 (!) 127    Resp:      Temp:      TempSrc:      SpO2: 100% 100%    Weight:      Height:         Body mass index is 30.34 kg/m.  Physical Exam   Constitutional: Appears well-developed and well-nourished.  Eyes: No scleral injection.  HENT: Now extubated. Vocalizations are hoarse.  Head: Normocephalic.  Respiratory: Unlabored respirations Skin: Edema is noted  Neurologic Examination   Mental Status: Awake and alert with decreased attention. Able to follow all commands. Speech is sparse but fluent. No dysarthria. Partially oriented.   Cranial Nerves: II: Pupils are equal    III,IV, VI: Eyes are conjugate with intact EOM. Jerky visual pursuits are noted. No nystagmus.   VII: Smile is symmetric VIII: Hearing intact to conversation IX,X: Mild hoarseness XI: Head is midline XII: No lingual dysarthria Motor/Sensory: Moves all 4 extremities symmetrically to command  Deep Tendon Reflexes: 1+ bilateral patellae and brachioradialis Cerebellar: No ataxia noted Gait: Deferred    Medications Current Medications[1]  Labs and Diagnostic Imaging   CBC:  Recent Labs  Lab 01/14/25 1416 01/15/25 0542  WBC 26.0* 21.0*  HGB 8.0* 7.7*  HCT 22.5* 21.3*  MCV 93.4 94.2  PLT 120* 88*    Basic Metabolic Panel:  Lab Results  Component Value Date   NA 142 01/15/2025   K 3.9 01/15/2025   CO2 26 01/15/2025   GLUCOSE 125 (H) 01/15/2025   BUN 20 01/15/2025   CREATININE 1.36 (H) 01/15/2025   CALCIUM  7.9 (L) 01/15/2025   GFRNONAA 56 (L) 01/15/2025   GFRAA 78 06/09/2021   Lipid Panel:  Lab Results  Component Value Date   LDLCALC 59 11/20/2024   HgbA1c:  Lab  Results  Component Value Date   HGBA1C 5.8 (H) 01/09/2025   Urine Drug Screen: No results found for: LABOPIA, COCAINSCRNUR, LABBENZ, AMPHETMU, THCU, LABBARB  Alcohol Level No results found for: [facility] INR  Lab Results  Component Value Date   INR 1.4 (H) 01/14/2025   APTT  Lab Results  Component Value Date   APTT 33 01/14/2025   Recent TTE (08/27/24): 1. Left ventricular ejection fraction, by estimation, is 55 to 60%. The  left ventricle has normal function. The left ventricle has no regional  wall motion abnormalities. There is moderate concentric left ventricular  hypertrophy. Left ventricular  diastolic parameters are consistent with Grade II diastolic dysfunction  (pseudonormalization).   2. Right ventricular systolic function is normal. The right ventricular  size is normal.   3. The mitral valve is normal in structure. No evidence of mitral valve  regurgitation. No evidence of mitral stenosis.   4. The aortic valve is calcified. There is severe calcifcation of the  aortic valve. There is severe thickening of the aortic valve. Aortic valve  regurgitation is moderate. Severe aortic valve stenosis. Aortic  regurgitation PHT measures 361 msec. Aortic  valve area, by VTI measures 1.19 cm. Aortic valve mean gradient measures  42.0 mmHg. Aortic valve Vmax measures 3.86 m/s.   5. Aortic dilatation noted. There is moderate dilatation of the ascending  aorta, measuring 44 mm.   6. The inferior vena cava is normal in size with greater than 50%  respiratory variability, suggesting right atrial pressure of 3 mmHg.    Assessment  Anthony Paul is a 69 y.o. male with stroke risk factors including hx of preDM2, tobacco use, COPD, severe AS s/p AVR who is currently admitted to the cardiovascular ICU post op. During surgery, air was found in the aortic line and patient was placed in trendelenburg position and air cleared. Post op in the CVICU, noted to have some weakness in his  left hand and left foot. Per CCM note, he denied parasthesias, was able to grossly move the LUE and LLE and had no facial droop, but was not able to squeeze with his left hand or wiggle his toes on the left. Neurology was consulted for further evaluation due to concern for possible stroke due to air embolism. On initial exam Monday night, it was difficult to determine if he had unilateral/focal deficits due to poor participation with exam and somnolence. On Tuesday morning, he was somnolent; ABG revealed a low pO2 of 47, low PCO2 of 27 and pH of 7.5 with no acid-base deficit. He then had a witnessed tonic-clonic seizure lasting for about 1 minute. Ativan  was administered, but he remained postictal and required intubation for airway protection. Keppra  2000 mg was loaded.  - Exam today reveals significant improvements since yesterday. Now with sparse, fluent speech and following all commands - CT head (personally reviewed): Cerebral volume within normal limits. No acute intracranial hemorrhage. No visible acute large vessel territory infarct. No mass lesion, midline shift or mass effect. No hydrocephalus or extra-axial fluid collection. - MRI brain: Patchy small volume restricted diffusion involving the posterior left temporo-occipital region, favored to reflect a small acute ischemic infarct. No associated hemorrhage or significant regional mass effect. - LTM EEG report for this morning: Continuous slow, generalized. This study was initially suggestive of severe diffuse encephalopathy likely related to sedation.  As sedation was weaned, EEG improved and was suggestive of mild diffuse encephalopathy.  No seizures or epileptiform discharges were seen throughout the recording. - Na and Mg normal. Ionized Ca was normal prior to the seizure. Was hypocapnic and alkalotic on ABG obtained just prior to the seizure. Hypocapnia may have lowered the seizure threshold.  - Impression: New onset seizure in the setting of  acute left temporal lobe stroke, possibly secondary to air embolization. Possible decreased seizure threshold secondary to hypocapnia.     Recommendations  - Discontinuing LTM EEG - Continue off Ultram , as this medication may lower the seizure threshold - Continue Keppra  at 500 mg IV BID. Can switch to PO as tolerated.  - Inpatient seizure precautions.  - Stroke work up to include continued cardiac telemetry as well as CTA of head and neck - Stroke Team to follow tomorrow morning.  ______________________________________________________________________   Bonney SHARK, Tanae Petrosky, MD [facility]     [1]  Current Facility-Administered Medications:    acetaminophen  (TYLENOL ) tablet 1,000 mg, 1,000 mg, Oral, Q6H, 1,000 mg at 01/15/25 0505 **OR** acetaminophen  (TYLENOL ) 160 MG/5ML solution 1,000 mg, 1,000 mg, Per Tube, Q6H, Lightfoot, Harrell O, MD, 1,000 mg at 01/14/25 2342   albuterol  (PROVENTIL ) (2.5 MG/3ML) 0.083% nebulizer solution 3 mL, 3 mL, Inhalation, Q4H PRN, Barrett, Erin R, PA-C   amiodarone  (NEXTERONE  PREMIX) 360-4.14 MG/200ML-% (1.8 mg/mL) IV infusion, 30 mg/hr, Intravenous, Continuous, Lightfoot, Harrell O, MD, Last Rate: 16.67 mL/hr at 01/15/25 0908, 30 mg/hr at 01/15/25  0908   arformoterol  (BROVANA ) nebulizer solution 15 mcg, 15 mcg, Nebulization, BID, Gretta Leita SQUIBB, DO, 15 mcg at 01/15/25 0725   aspirin  chewable tablet 324 mg, 324 mg, Oral, Once, Barrett, Erin R, PA-C   aspirin  EC tablet 325 mg, 325 mg, Oral, Daily **OR** aspirin  chewable tablet 324 mg, 324 mg, Per Tube, Daily, Barrett, Erin R, PA-C, 324 mg at 01/14/25 1015   atorvastatin  (LIPITOR) tablet 40 mg, 40 mg, Oral, QPM, Salam, Savannah B, RPH   bisacodyl  (DULCOLAX) EC tablet 10 mg, 10 mg, Oral, Daily **OR** bisacodyl  (DULCOLAX) suppository 10 mg, 10 mg, Rectal, Daily, Barrett, Erin R, PA-C   chlorhexidine  (HIBICLENS ) 4 % liquid 2 Application, 30 mL, Topical, UD, Lightfoot, Harrell O, MD   chlorhexidine   (PERIDEX ) 0.12 % solution 15 mL, 15 mL, Mouth/Throat, Once, Lightfoot, Harrell O, MD   Chlorhexidine  Gluconate Cloth 2 % PADS 6 each, 6 each, Topical, Daily, Lightfoot, Harrell O, MD, 6 each at 01/15/25 0900   docusate sodium  (COLACE) capsule 200 mg, 200 mg, Oral, Daily, Salam, Savannah B, RPH   enoxaparin  (LOVENOX ) injection 40 mg, 40 mg, Subcutaneous, QHS, Lightfoot, Harrell O, MD   fentaNYL  (SUBLIMAZE ) injection 12.5-25 mcg, 12.5-25 mcg, Intravenous, Q4H PRN, Smith, Joshua C, NP, 25 mcg at 01/14/25 2329   CBG monitoring, , , Q4H **AND** insulin  aspart (novoLOG ) injection 0-24 Units, 0-24 Units, Subcutaneous, Q4H, Lightfoot, Harrell O, MD, 2 Units at 01/15/25 9188   levETIRAcetam  (KEPPRA ) undiluted injection 500 mg, 500 mg, Intravenous, Q12H, Pammy Vesey, MD, 500 mg at 01/15/25 0802   magnesium  sulfate IVPB 2 g 50 mL, 2 g, Intravenous, Once, Gretta Leita SQUIBB, DO, Last Rate: 50 mL/hr at 01/15/25 1055, 2 g at 01/15/25 1055   metoprolol  tartrate (LOPRESSOR ) tablet 12.5 mg, 12.5 mg, Oral, BID, 12.5 mg at 01/13/25 0900 **OR** metoprolol  tartrate (LOPRESSOR ) 25 mg/10 mL oral suspension 12.5 mg, 12.5 mg, Per Tube, BID, Barrett, Erin R, PA-C   metoprolol  tartrate (LOPRESSOR ) injection 2.5-5 mg, 2.5-5 mg, Intravenous, Q2H PRN, Barrett, Erin R, PA-C   midazolam  PF (VERSED ) injection 2 mg, 2 mg, Intravenous, Q1H PRN, Gretta Leita SQUIBB, DO, 2 mg at 01/14/25 2225   norepinephrine  (LEVOPHED ) 4mg  in (0.016 mg/mL) premix infusion, 0-40 mcg/min, Intravenous, Titrated, Gretta Leita SQUIBB, DO, Stopped at 01/15/25 9156   ondansetron  (ZOFRAN ) injection 4 mg, 4 mg, Intravenous, Q6H PRN, Barrett, Erin R, PA-C   Oral care mouth rinse, 15 mL, Mouth Rinse, PRN, Lightfoot, Harrell O, MD   Oral care mouth rinse, 15 mL, Mouth Rinse, PRN, Lightfoot, Harrell O, MD   Oral care mouth rinse, 15 mL, Mouth Rinse, QID, Lightfoot, Harrell O, MD   oxyCODONE  (Oxy IR/ROXICODONE ) immediate release tablet 5 mg, 5 mg, Oral, Q6H, Salam,  Savannah B, RPH   oxyCODONE  (Oxy IR/ROXICODONE ) immediate release tablet 5-10 mg, 5-10 mg, Oral, Q3H PRN, Salam, Savannah B, RPH   pantoprazole  (PROTONIX ) injection 40 mg, 40 mg, Intravenous, QHS, Paytes, Emma U, RPH, 40 mg at 01/14/25 2342   potassium chloride  (KLOR-CON ) packet 20 mEq, 20 mEq, Oral, Once, Gretta, Laura P, DO   revefenacin  (YUPELRI ) nebulizer solution 175 mcg, 175 mcg, Nebulization, Daily, Gretta Leita SQUIBB, DO, 175 mcg at 01/15/25 0725   sodium chloride  flush (NS) 0.9 % injection 3 mL, 3 mL, Intravenous, Q12H, Barrett, Erin R, PA-C, 3 mL at 01/15/25 1050   sodium chloride  flush (NS) 0.9 % injection 3 mL, 3 mL, Intravenous, PRN, Barrett, Erin R, PA-C

## 2025-01-15 NOTE — Procedures (Signed)
 Extubation Procedure Note  Patient Details:   Name: Anthony Paul DOB: Aug 21, 1956 MRN: [id number]   Airway Documentation:    Vent end date: 01/15/25 Vent end time: 0730   Evaluation  O2 sats: stable throughout Complications: No apparent complications Patient did tolerate procedure well. Bilateral Breath Sounds: Clear, Diminished   Yes  Pt extubated per physician order to 5L nasal cannula without complication. Pt suctioned via ETT/orally prior and positive cuff leak heard. Upon extubation, pt able to speak name, give a good cough and no stridor heard at this time.   Geofm FORBES Batty 01/15/2025, 7:35 AM

## 2025-01-15 NOTE — Progress Notes (Signed)
 Pain significant, 10/10. Takes oxycodone  10mg  3-6 times per day based on fill history. Daughter thought it was 3 times per day.   Will start scheduled oxycodone  10mg  q8h with hold parameters and leave PRN for severe pain that can be given in addition.  Passed swallow eval with speech, starting heart healthy diet. Can resume PTA chronic constipation med.  Anthony SHAUNNA Gaskins, DO 01/15/25 12:17 PM [facility]

## 2025-01-15 NOTE — Plan of Care (Signed)
" °  Problem: Education: Goal: Knowledge of General Education information will improve Description: Including pain rating scale, medication(s)/side effects and non-pharmacologic comfort measures Outcome: Progressing   Problem: Health Behavior/Discharge Planning: Goal: Ability to manage health-related needs will improve Outcome: Progressing   Problem: Clinical Measurements: Goal: Ability to maintain clinical measurements within normal limits will improve Outcome: Progressing Goal: Will remain free from infection Outcome: Progressing Goal: Diagnostic test results will improve Outcome: Progressing Goal: Cardiovascular complication will be avoided Outcome: Progressing   Problem: Activity: Goal: Risk for activity intolerance will decrease Outcome: Progressing   Problem: Coping: Goal: Level of anxiety will decrease Outcome: Progressing   Problem: Elimination: Goal: Will not experience complications related to bowel motility Outcome: Progressing Goal: Will not experience complications related to urinary retention Outcome: Progressing   "

## 2025-01-15 NOTE — Progress Notes (Signed)
  Inpatient Rehab Admissions Coordinator :  Per therapy recommendations, patient was screened for CIR candidacy by Ottie Glazier RN MSN.  At this time patient appears to be a potential candidate for CIR. I will place a rehab consult per protocol for full assessment. Please call me with any questions.  Ottie Glazier RN MSN Admissions Coordinator [phone]

## 2025-01-15 NOTE — Procedures (Addendum)
 Patient Name: Anthony Paul  MRN: [id number]  Epilepsy Attending: Arlin MALVA Krebs  Referring Physician/Provider: Khaliqdina, Salman, MD  Duration: 01/14/2025 0904 to 01/15/2025 1045  Patient history: 69yo M with witnessed tonic-clonic seizure lasting for about 1 minute. EEG to evaluate for seizure  Level of alertness: comatose/ lethargic   AEDs during EEG study: LEV, propofol   Technical aspects: This EEG study was done with scalp electrodes positioned according to the 10-20 International system of electrode placement. Electrical activity was reviewed with band pass filter of 1-70Hz , sensitivity of 7 uV/mm, display speed of 90mm/sec with a 60Hz  notched filter applied as appropriate. EEG data were recorded continuously and digitally stored.  Video monitoring was available and reviewed as appropriate.  Description: EEG showed continuous generalized 3 to 6 Hz theta-delta slowing admixed with 13-15hz  beta activity distributed symmetrically and diffusely.  As sedation was weaned, EEG improved and showed posterior dominant rhythm of 8-9 Hz activity of moderate voltage (25-35 uV) seen predominantly in posterior head regions, symmetric and reactive to eye opening and eye.  EEG also showed intermittent generalized 3 to 6 Hz theta-delta slowing. Hyperventilation and photic stimulation were not performed.  EEG was disconnected between 01/14/2025 2159 to 2336 for testing  ABNORMALITY - Continuous slow, generalized  IMPRESSION: This study was initially suggestive of severe diffuse encephalopathy likely related to sedation.  As sedation was weaned, EEG improved and was suggestive of mild diffuse encephalopathy.  No seizures or epileptiform discharges were seen throughout the recording.  Rebbeca Sheperd O Leya Paige

## 2025-01-15 NOTE — Progress Notes (Signed)
 LTM EEG D/C'd. No noted skin break down. [facility] notified.

## 2025-01-15 NOTE — Progress Notes (Signed)
 "  NAME:  Anthony Paul, MRN:  [id number], DOB:  Mar 03, 1956, LOS: 2 ADMISSION DATE:  01/13/2025, CONSULTATION DATE:  1/26 REFERRING MD:  Shyrl, CHIEF COMPLAINT:  s/p AVR    History of Present Illness:  69 year old male with past medical history of prediabetes, BPH, hyperlipidemia, tobacco use and COPD, bicuspid aortic valve with severe AS who presents for AVR.   Followed by Dr. Delford with cardiology for murmur since 2014. Found to have bicuspid AV. Had serial echos showing worsening disease. He had RHC 09/2024 showing patent coronaries, normal RHC, calcified bicuspid aortic valve with restricted mobility. Recommending CVTS evaluation. 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 was seen by Dr. Shyrl TCTS 12/27/24 for AVR, now s/p replacement.  Pump time: 102 Xclamp time: 71 EBL: ~ 460 UOP:  950 cc  Products: no blood products   Cell saver: 230 cc   Pertinent  Medical History  prediabetes, BPH, hyperlipidemia, tobacco use and COPD, bicuspid aortic valve with severe AS  Significant Hospital Events: Including procedures, antibiotic start and stop dates in addition to other pertinent events   1/26: s/p AVR , extubated post-op. Had some L sided weakness> head CT ok, Neuro consulted. 1/27 early morning had sudden tonic clonic seizures, sudden shock, required intubation. cEEG. Swan & pacing wires out for MRI brain.  Interim History / Subjective:  Waking up this morning; main complaint is ETT-related discomfort.  Tmax 100.9 yesterday, afebrile overnight.   Objective   Blood pressure 110/76, pulse 81, temperature 99.5 F (37.5 C), resp. rate (!) 24, height 6' 1 (1.854 m), weight 104.3 kg, SpO2 100%. PAP: (23-40)/(4-21) 27/17 CVP:  [2 mmHg-11 mmHg] 9 mmHg  Vent Mode: PSV;CPAP FiO2 (%):  [40 %-100 %] 40 % Set Rate:  [18 bmp-24 bmp] 18 bmp Vt Set:  [640 mL] 640 mL PEEP:  [5 cmH20-8 cmH20] 5 cmH20 Pressure Support:  [8 cmH20] 8 cmH20 Plateau  Pressure:  [20 cmH20-21 cmH20] 21 cmH20   Intake/Output Summary (Last 24 hours) at 01/15/2025 9361 Last data filed at 01/15/2025 0400 Gross per 24 hour  Intake 2185.55 ml  Output 1755 ml  Net 430.55 ml   Filed Weights   01/13/25 0825 01/15/25 0500  Weight: 105.2 kg 104.3 kg    Examination: General: critically ill appearing man sitting up in bed in NAD HENT: [place]/AT,  eyes anicteric Lungs: breathing comfortably on PS/CPAP, CTAB,  Cardiovascular: S1S2, RRR> went into Afib with RVR following my exam. Chest tubes with serosanguinous output. Abdomen: soft, NT Extremities: minimal edema Neuro: RASS +1, following commands, grossly moving all extremities. Able to nod to answer questions.  GU: foley with amber urine  BUN 20 Cr 1.36 WBC 21 H/H 7.7/21.3 Platelets 88  Chest tubes 430cc> no night shift output recorded  CXR personally reviewed> R pleural effusion vs early RLL infiltrate. ETT in place.  MRI brain: small acute ischemic infarct temporoccipital region   Resolved Hospital Problem list    Assessment & Plan:  Severe aortic stenosis s/p AVR ; EF 55-60% Bicuspid AV  Frequent PVCS , NSVT Post bypass vasoplegia -post op management per TCTS -d/c Aline today -con't CVC, chest tubes -d/c foley  -metoprolol  once off NE -titrate off NE to MAP >65 -aspirin , statin -pain control per protocol- may need higher doses with chronic opiate requirements -no tramadol  due to lowering seizure threshold -monitor CBC; no current indication for transfusion  New onset seizure, left side weakness; MRI with small ischemic stroke-- appears watershed -con't  keppra  -cEEG per Neuro -PT, OT, SLP -avoid all meds that lower seizure threshold-- no tramadol  -PT, OT, SLP  New onset Afib with RVR -amiodarone  load, start infusion at 30mg /h since no pacing wires in place -empiric magnesium  -decision on AC depends on duration of Afib  Acute respiratory failure with hypoxia due to hypoventilation  with seizure COPD  Remote tobacco use  -LTVV -VAP prevention protocol -PAD protocol -SAT & SBT-- passed, extubated to [place] -brovana  & yupelri   Hyperglycemia, h/o prediabetes  -transition to basal bolus insulin    HLD  -statin  BPH  -resume tamsulosin   Daughter and another family member updated at bedside this morning. Discsused during multidisciplinary rounds.   Labs   CBC: Recent Labs  Lab 01/09/25 1200 01/13/25 1041 01/13/25 1243 01/13/25 1244 01/13/25 1500 01/13/25 1502 01/14/25 0503 01/14/25 0553 01/14/25 0729 01/14/25 0809 01/14/25 1000 01/14/25 1205 01/14/25 1416  WBC 7.0  --   --   --  17.9*  --  13.5*  --   --   --   --   --  26.0*  HGB 14.8   < > 11.5*   < > 10.8*   < > 8.1*   < > 7.5* 7.5* 7.8* 7.8* 8.0*  HCT 42.8   < > 33.0*   < > 31.4*   < > 22.6*   < > 22.0* 22.0* 23.0* 23.0* 22.5*  MCV 92.6  --   --   --  94.6  --  92.6  --   --   --   --   --  93.4  PLT 188  --  147*  --  120*  --  108*  --   --   --   --   --  120*   < > = values in this interval not displayed.    Basic Metabolic Panel: Recent Labs  Lab 01/09/25 1200 01/13/25 1041 01/13/25 1244 01/13/25 1342 01/13/25 1346 01/13/25 1502 01/14/25 0503 01/14/25 0553 01/14/25 0809 01/14/25 1000 01/14/25 1205 01/14/25 1558 01/15/25 0542  NA 140   < > 139   < > 144   < > 141   < > 145 145 145 141 142  K 4.2   < > 4.8   < > 4.3   < > 3.6   < > 3.4* 3.3* 3.4* 3.7 3.9  CL 104   < > 106  --  102  --  108  --   --   --   --  106 106  CO2 26  --   --   --   --   --  21*  --   --   --   --  26 26  GLUCOSE 121*   < > 135*  --  140*  --  138*  --   --   --   --  146* 125*  BUN 17   < > 16  --  15  --  18  --   --   --   --  21 20  CREATININE 1.10   < > 1.10  --  1.00  --  1.14  --   --   --   --  1.37* 1.36*  CALCIUM  9.4  --   --   --   --   --  8.3*  --   --   --   --  8.2* 7.9*  MG  --   --   --   --   --   --  2.3  --   --   --   --  3.1* 2.5*  PHOS  --   --   --   --   --   --   --   --   --    --   --   --  2.5   < > = values in this interval not displayed.   GFR: Estimated Creatinine Clearance: 65 mL/min (A) (by C-G formula based on SCr of 1.36 mg/dL (H)). Recent Labs  Lab 01/09/25 1200 01/13/25 1500 01/14/25 0503 01/14/25 1416  WBC 7.0 17.9* 13.5* 26.0*    Liver Function Tests: Recent Labs  Lab 01/09/25 1200  AST 19  ALT 21  ALKPHOS 59  BILITOT 0.3  PROT 6.4*  ALBUMIN  4.1       Critical care time:       This patient is critically ill with multiple organ system failure which requires frequent high complexity decision making, assessment, support, evaluation, and titration of therapies. This was completed through the application of advanced monitoring technologies and extensive interpretation of multiple databases. During this encounter critical care time was devoted to patient care services described in this note for 41 minutes.  Leita SHAUNNA Gaskins, DO 01/15/25 10:29 AM [facility]  For contact information, see Amion. If no response to pager, please call PCCM 2H APP. After hours, 7PM- 7AM, please call on call APP for 2H.   "

## 2025-01-16 ENCOUNTER — Inpatient Hospital Stay (HOSPITAL_COMMUNITY)

## 2025-01-16 DIAGNOSIS — R569 Unspecified convulsions: Secondary | ICD-10-CM | POA: Diagnosis not present

## 2025-01-16 DIAGNOSIS — Z6831 Body mass index (BMI) 31.0-31.9, adult: Secondary | ICD-10-CM | POA: Diagnosis not present

## 2025-01-16 DIAGNOSIS — I63412 Cerebral infarction due to embolism of left middle cerebral artery: Secondary | ICD-10-CM

## 2025-01-16 DIAGNOSIS — Z7982 Long term (current) use of aspirin: Secondary | ICD-10-CM | POA: Diagnosis not present

## 2025-01-16 DIAGNOSIS — R739 Hyperglycemia, unspecified: Secondary | ICD-10-CM | POA: Diagnosis not present

## 2025-01-16 DIAGNOSIS — G4089 Other seizures: Secondary | ICD-10-CM | POA: Diagnosis not present

## 2025-01-16 DIAGNOSIS — I4891 Unspecified atrial fibrillation: Secondary | ICD-10-CM

## 2025-01-16 DIAGNOSIS — J449 Chronic obstructive pulmonary disease, unspecified: Secondary | ICD-10-CM | POA: Diagnosis not present

## 2025-01-16 DIAGNOSIS — R29700 NIHSS score 0: Secondary | ICD-10-CM

## 2025-01-16 DIAGNOSIS — I350 Nonrheumatic aortic (valve) stenosis: Secondary | ICD-10-CM | POA: Diagnosis not present

## 2025-01-16 DIAGNOSIS — E785 Hyperlipidemia, unspecified: Secondary | ICD-10-CM | POA: Diagnosis not present

## 2025-01-16 DIAGNOSIS — F17210 Nicotine dependence, cigarettes, uncomplicated: Secondary | ICD-10-CM | POA: Diagnosis not present

## 2025-01-16 DIAGNOSIS — E669 Obesity, unspecified: Secondary | ICD-10-CM

## 2025-01-16 DIAGNOSIS — Z87891 Personal history of nicotine dependence: Secondary | ICD-10-CM

## 2025-01-16 DIAGNOSIS — Z952 Presence of prosthetic heart valve: Secondary | ICD-10-CM | POA: Diagnosis not present

## 2025-01-16 DIAGNOSIS — J9601 Acute respiratory failure with hypoxia: Secondary | ICD-10-CM | POA: Diagnosis not present

## 2025-01-16 LAB — BASIC METABOLIC PANEL WITH GFR
Anion gap: 7 (ref 5–15)
BUN: 23 mg/dL (ref 8–23)
CO2: 28 mmol/L (ref 22–32)
Calcium: 8 mg/dL — ABNORMAL LOW (ref 8.9–10.3)
Chloride: 103 mmol/L (ref 98–111)
Creatinine, Ser: 1.13 mg/dL (ref 0.61–1.24)
GFR, Estimated: >60 mL/min
Glucose, Bld: 118 mg/dL — ABNORMAL HIGH (ref 70–99)
Potassium: 3.9 mmol/L (ref 3.5–5.1)
Sodium: 138 mmol/L (ref 135–145)

## 2025-01-16 LAB — CBC
HCT: 19.1 % — ABNORMAL LOW (ref 39.0–52.0)
HCT: 18.7 % — ABNORMAL LOW (ref 39.0–52.0)
Hemoglobin: 6.4 g/dL — CL (ref 13.0–17.0)
Hemoglobin: 6.3 g/dL — CL (ref 13.0–17.0)
MCH: 32.5 pg (ref 26.0–34.0)
MCH: 32.6 pg (ref 26.0–34.0)
MCHC: 33.5 g/dL (ref 30.0–36.0)
MCHC: 33.7 g/dL (ref 30.0–36.0)
MCV: 97 fL (ref 80.0–100.0)
MCV: 96.9 fL (ref 80.0–100.0)
Platelets: 71 10*3/uL — ABNORMAL LOW (ref 150–400)
Platelets: 72 10*3/uL — ABNORMAL LOW (ref 150–400)
RBC: 1.97 MIL/uL — ABNORMAL LOW (ref 4.22–5.81)
RBC: 1.93 MIL/uL — ABNORMAL LOW (ref 4.22–5.81)
RDW: 13.1 % (ref 11.5–15.5)
RDW: 13.1 % (ref 11.5–15.5)
WBC: 9.2 10*3/uL (ref 4.0–10.5)
WBC: 8.6 10*3/uL (ref 4.0–10.5)
nRBC: 0 % (ref 0.0–0.2)
nRBC: 0 % (ref 0.0–0.2)

## 2025-01-16 LAB — MAGNESIUM: Magnesium: 2.3 mg/dL (ref 1.7–2.4)

## 2025-01-16 LAB — PREPARE RBC (CROSSMATCH)

## 2025-01-16 LAB — HEMOGLOBIN AND HEMATOCRIT, BLOOD
HCT: 22.8 % — ABNORMAL LOW (ref 39.0–52.0)
Hemoglobin: 7.9 g/dL — ABNORMAL LOW (ref 13.0–17.0)

## 2025-01-16 LAB — GLUCOSE, CAPILLARY
Glucose-Capillary: 121 mg/dL — ABNORMAL HIGH (ref 70–99)
Glucose-Capillary: 120 mg/dL — ABNORMAL HIGH (ref 70–99)
Glucose-Capillary: 97 mg/dL (ref 70–99)
Glucose-Capillary: 104 mg/dL — ABNORMAL HIGH (ref 70–99)
Glucose-Capillary: 85 mg/dL (ref 70–99)

## 2025-01-16 LAB — SURGICAL PATHOLOGY

## 2025-01-16 MED ORDER — TAMSULOSIN HCL 0.4 MG PO CAPS
0.4000 mg | ORAL_CAPSULE | Freq: Every day | ORAL | Status: DC
  Administered 2025-01-16 – 2025-01-23 (×8): 0.4 mg via ORAL
  Filled 2025-01-16 (×8): qty 1

## 2025-01-16 MED ORDER — LEVETIRACETAM 500 MG PO TABS
500.0000 mg | ORAL_TABLET | Freq: Two times a day (BID) | ORAL | Status: DC
  Administered 2025-01-16 – 2025-01-23 (×14): 500 mg via ORAL
  Filled 2025-01-16: qty 2
  Filled 2025-01-16 (×3): qty 1
  Filled 2025-01-16: qty 2
  Filled 2025-01-16 (×2): qty 1
  Filled 2025-01-16 (×2): qty 2
  Filled 2025-01-16 (×5): qty 1

## 2025-01-16 MED ORDER — PANTOPRAZOLE SODIUM 40 MG PO TBEC
40.0000 mg | DELAYED_RELEASE_TABLET | Freq: Every day | ORAL | Status: DC
  Administered 2025-01-16 – 2025-01-22 (×7): 40 mg via ORAL
  Filled 2025-01-16 (×7): qty 1

## 2025-01-16 MED ORDER — FUROSEMIDE 10 MG/ML IJ SOLN
40.0000 mg | Freq: Once | INTRAMUSCULAR | Status: AC
  Administered 2025-01-16: 40 mg via INTRAVENOUS
  Filled 2025-01-16: qty 4

## 2025-01-16 MED ORDER — ENSURE PLUS HIGH PROTEIN PO LIQD
237.0000 mL | Freq: Two times a day (BID) | ORAL | Status: DC
  Administered 2025-01-16 – 2025-01-17 (×3): 237 mL via ORAL
  Filled 2025-01-16: qty 237

## 2025-01-16 MED ORDER — POTASSIUM CHLORIDE CRYS ER 20 MEQ PO TBCR
40.0000 meq | EXTENDED_RELEASE_TABLET | Freq: Once | ORAL | Status: AC
  Administered 2025-01-16: 40 meq via ORAL
  Filled 2025-01-16: qty 2

## 2025-01-16 MED ORDER — IOHEXOL 350 MG/ML SOLN
75.0000 mL | Freq: Once | INTRAVENOUS | Status: AC | PRN
  Administered 2025-01-16: 75 mL via INTRAVENOUS

## 2025-01-16 MED ORDER — INSULIN ASPART 100 UNIT/ML IJ SOLN: 0.0000 [IU] | Freq: Three times a day (TID) | INTRAMUSCULAR | Status: DC

## 2025-01-16 MED ORDER — SODIUM CHLORIDE 0.9% IV SOLUTION: Freq: Once | INTRAVENOUS | Status: AC

## 2025-01-16 NOTE — Progress Notes (Signed)
 Inpatient Rehab Admissions Coordinator:    I met with Pt. And daughters to discuss potential CIR admit. They are tentatively interested but hopeful Pt. Will progress to be able to dc directly home. He remains on cardiac drips so not ready at this time, and we can see how he progresses over the weekend. Family is able to provide 24/7 assist if Pt. Does come to CIR.   Leita Kleine, MS, CCC-SLP Rehab Admissions Coordinator  [phone] (celll) [phone] (office)

## 2025-01-16 NOTE — Progress Notes (Signed)
 RN arrived to 2H04 after noting patient was worked up for a Code Stroke after his AVR procedure. A Code Stroke was called on 1/26 regarding concerns for possible stroke due to air embolism post-operatively after finding air in an aortic line. The patient had been intermittently drowsy, left sided weakness in both upper and lower extremity, but would follow simple commands. Patient was later intubated the next day. However, the patient was extubated 1/28 with gradual improvement since initial Code Stroke was called.   Upon arriving to unit, RN noted patient was in chair, eating, and bedside RN re-affirmed there were no NIHSS orders in place. RN to follow up with stroke team regarding documentation/order misses.  For any questions related to patient's stroke care, please call:  Stroke Response Nurse (Monday - Saturday 0700-1900): [phone]  [employer] Nurse (Nights and Sundays): [phone]   Odell Bean, BSN, RN Stroke Response RN

## 2025-01-16 NOTE — Progress Notes (Signed)
 "  NAME:  Anthony Paul, MRN:  [id number], DOB:  02/28/1956, LOS: 3 ADMISSION DATE:  01/13/2025, CONSULTATION DATE:  1/26 REFERRING MD:  Shyrl, CHIEF COMPLAINT:  s/p AVR    History of Present Illness:  69 year old male with past medical history of prediabetes, BPH, hyperlipidemia, tobacco use and COPD, bicuspid aortic valve with severe AS who presents for AVR.   Followed by Dr. Delford with cardiology for murmur since 2014. Found to have bicuspid AV. Had serial echos showing worsening disease. He had RHC 09/2024 showing patent coronaries, normal RHC, calcified bicuspid aortic valve with restricted mobility. Recommending CVTS evaluation. 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 was seen by Dr. Shyrl TCTS 12/27/24 for AVR, now s/p replacement.  Pump time: 102 Xclamp time: 71 EBL: ~ 460 UOP:  950 cc  Products: no blood products   Cell saver: 230 cc   Pertinent  Medical History  prediabetes, BPH, hyperlipidemia, tobacco use and COPD, bicuspid aortic valve with severe AS  Significant Hospital Events: Including procedures, antibiotic start and stop dates in addition to other pertinent events   1/26: s/p AVR , extubated post-op. Had some L sided weakness> head CT ok, Neuro consulted. 1/27 early morning had sudden tonic clonic seizures, sudden shock, required intubation. cEEG. Swan & pacing wires out for MRI brain. 1/28 extubated to [place], weaned off NE  Interim History / Subjective:  Today feels well. He is up to the chair this morning. Was able to get to bedside commode yesterday but has not walked yet. Pain is controlled.   Objective   Blood pressure 97/64, pulse (!) 118, temperature 98.2 F (36.8 C), temperature source Axillary, resp. rate (!) 24, height 6' 1 (1.854 m), weight 104.3 kg, SpO2 96%.    FiO2 (%):  [40 %] 40 % PEEP:  [5 cmH20] 5 cmH20 Pressure Support:  [5 cmH20] 5 cmH20   Intake/Output Summary (Last 24 hours) at 01/16/2025  0635 Last data filed at 01/16/2025 9386 Gross per 24 hour  Intake 1853.01 ml  Output 1620 ml  Net 233.01 ml   Filed Weights   01/13/25 0825 01/15/25 0500  Weight: 105.2 kg 104.3 kg    Examination: General: elderly man sitting up in the chair in NAD HENT: [place]/A, eyes anicteric Lungs: breathing comfortably on [address] [place], reduced basilar breath sounds but otherwise CTA Cardiovascular: S1S2, RRR. Serosanguinous output from chest tubes. NSR on tele.  Abdomen: soft, NT Extremities: mild LE edema Neuro: awake and alert, moving all extremities briskly on command, including wiggling all toes. Normal speech, answering questions appropriately.   BUN 23 Cr 1.13 WBC 8.6 H/H 6.3/18.7 Platelets 72  Chest tubes 230cc  CXR personally reviewed> R pleural effusion vs early RLL infiltrate. ETT in place.  EEG: no epileptiform discharges, encephalopathy improved with sedation weaning   Resolved Hospital Problem list    Assessment & Plan:  Severe aortic stenosis s/p AVR ; EF 55-60% Bicuspid AV  Frequent PVCS , NSVT Post bypass vasoplegia -post-op management per TCTS -diuresis -d/c chest tubes -metoprolol  -aspirin , statin -tele monitoring -pain control per protocol -mobilize and advance diet as able  New onset seizure, left side weakness; MRI with small ischemic stroke -keppra  -discussed seizure precautions; no driving x 6 months -will need AC for Afib -aspirin , statin -seizure precautions, midazolam  as needed -PT, OT -avoid all meds that lower seizure threshold-- con't holding tramadol  -CTA H&N today per stroke team  New onset Afib with RVR -con't amiodarone  IV today, oral tomorrow  if he can stay out of Afib. Went briefly back into Afib twice this morning -additional empiric MG+  -will need AC  Acute respiratory failure with hypoxia due to hypoventilation with seizure COPD  Remote tobacco use  -bronchodilators -lasix  40mg  -wean O2  Hyperglycemia, h/o prediabetes  -SSI  PRN -goal BG 140-180  HLD  -con't statin  BPH  -tamsulosin   Anemia post op, not unexpected or clinically significant  -1 unit pRBC today   Daughter updated at bedside this morning.   Labs   CBC: Recent Labs  Lab 01/14/25 0503 01/14/25 0553 01/14/25 1416 01/15/25 0542 01/15/25 0851 01/16/25 0358 01/16/25 0457  WBC 13.5*  --  26.0* 21.0*  --  9.2 8.6  HGB 8.1*   < > 8.0* 7.7* 7.1* 6.4* 6.3*  HCT 22.6*   < > 22.5* 21.3* 21.0* 19.1* 18.7*  MCV 92.6  --  93.4 94.2  --  97.0 96.9  PLT 108*  --  120* 88*  --  71* 72*   < > = values in this interval not displayed.    Basic Metabolic Panel: Recent Labs  Lab 01/09/25 1200 01/13/25 1041 01/13/25 1346 01/13/25 1502 01/14/25 0503 01/14/25 0553 01/14/25 1205 01/14/25 1558 01/15/25 0542 01/15/25 0851 01/16/25 0358  NA 140   < > 144   < > 141   < > 145 141 142 141 138  K 4.2   < > 4.3   < > 3.6   < > 3.4* 3.7 3.9 3.6 3.9  CL 104   < > 102  --  108  --   --  106 106  --  103  CO2 26  --   --   --  21*  --   --  26 26  --  28  GLUCOSE 121*   < > 140*  --  138*  --   --  146* 125*  --  118*  BUN 17   < > 15  --  18  --   --  21 20  --  23  CREATININE 1.10   < > 1.00  --  1.14  --   --  1.37* 1.36*  --  1.13  CALCIUM  9.4  --   --   --  8.3*  --   --  8.2* 7.9*  --  8.0*  MG  --   --   --   --  2.3  --   --  3.1* 2.5*  --   --   PHOS  --   --   --   --   --   --   --   --  2.5  --   --    < > = values in this interval not displayed.   GFR: Estimated Creatinine Clearance: 78.3 mL/min (by C-G formula based on SCr of 1.13 mg/dL). Recent Labs  Lab 01/14/25 1416 01/15/25 0542 01/16/25 0358 01/16/25 0457  WBC 26.0* 21.0* 9.2 8.6    Liver Function Tests: Recent Labs  Lab 01/09/25 1200  AST 19  ALT 21  ALKPHOS 59  BILITOT 0.3  PROT 6.4*  ALBUMIN  4.1       Critical care time:       Leita SHAUNNA Gaskins, DO 01/16/25 12:14 PM [facility]  For contact information, see Amion. If no response to  pager, please call PCCM 2H APP. After hours, 7PM- 7AM, please call on call APP for 2H.   "

## 2025-01-16 NOTE — PMR Pre-admission (Shared)
 PMR Admission Coordinator Pre-Admission Assessment  Patient: Anthony Paul is an 69 y.o., male MRN: [id number] DOB: December 18, 1956 Height: 6' 1 (185.4 cm) Weight: 108.1 kg  Insurance Information HMO: yes    PPO:      PCP:      IPA:      80/20:      OTHER:  PRIMARY: Medicare A an B     Policy#:[id number]   Subscriber: pt Benefits:  Phone #: passport one source     Name:  Eff. Date: 01/20/2024     Deduct: $1676      Out of Pocket Max: none      Life Max: none CIR: 100%      SNF: 20 full days Outpatient: 80%     Co-Pay: 20% Home Health: 100%      Co-Pay:  DME: 80%     Co-Pay: 20% Providers: pt choice SECONDARY: BCBS Supplement       Policy#: [id number]  Phone#:   Financial Counselor:       Phone#:   [facility] for patients in Inpatient Rehabilitation Facilities with attached Privacy Act Statement-Health Care Records was provided and verbally reviewed with: Patient and Family  Emergency Contact Information Contact Information     Name Relation Home Work Mobile   [place] Spouse [phone]        Other Contacts     Name Relation Home Work Mobile   [place] Daughter   [phone]       Current Medical History  Patient Admitting Diagnosis: AVR post op stroke  History of Present Illness: Pt is a 69 year old male with past medical history of prediabetes, BPH, hyperlipidemia, tobacco use and COPD, bicuspid aortic valve with severe AS who presented to [facility] for AVR 01/13/25. He is Followed by Dr. Delford with cardiology for murmur since 2014. Found to have bicuspid AV. Had serial echos showing worsening disease. He had RHC 09/2024 showing patent coronaries, normal RHC, calcified bicuspid aortic valve with restricted mobility. Recommending CVTS evaluation. 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 was seen by Dr. Shyrl TCTS 01/13/25 for AV  replacement.  He developed L sided weakness post operatively. Head CT was clear. On 1/27 he developed sudden tonic clonic seizures, sudden shock, required intubation. atient was extubated 1/28 with gradual improvement since initial Code Stroke was called. MRI brain: Patchy small volume restricted diffusion involving the posterior left temporo-occipital region, favored to reflect a small acute ischemic infarct. No associated hemorrhage or significant regional mass effect.LTM EEG showed Continuous slow, generalized. This study was initially suggestive of severe diffuse encephalopathy likely related to sedation.  As sedation was weaned, EEG improved and was suggestive of mild diffuse encephalopathy. Pt. Seen by PT/OT and they recommend CIR to assist return to PLOF.     Patient's medical record from [facility] has been reviewed by the rehabilitation admission coordinator and physician.  Past Medical History  Past Medical History:  Diagnosis Date   Abnormal CT scan, chest    Blebs on CT chest but NORMAL PFTs- no COPD   Aortic stenosis    severe 2025--->pt to get tissue valve replacement 2026 (Dr. Kerrin)   Ascending aorta dilatation    needs annual CTA (Dr. Delford)   Bicuspid aortic valve    Echo 2014  EF 55-60% Mild to moderate AR mild AS with mean gradient 18 mmHg and peak of 25 mmHg. ? Bicuspid valve with  dilated aortic root 4.0 cm.   BPH with obstruction/lower urinary tract symptoms    Chronic abdominal pain    Atypical features/unknown etiology.  Followed by [facility] pain clinic.   COPD (chronic obstructive pulmonary disease) (HCC)    diverticulosis    hx diverticulitis and partial colon resection   DJD (degenerative joint disease)    ED (erectile dysfunction)    Elevated blood pressure reading without diagnosis of hypertension    Heart murmur    Hx of colonic polyp 2015   Insomnia    Lumbar spondylosis    Mixed hyperlipidemia    Prediabetes    Tobacco abuse     Vitamin B12 deficiency     Has the patient had major surgery during 100 days prior to admission? Yes  Family History   family history includes Benign prostatic hyperplasia in his father; Cancer in his sister; Diabetes in his mother; Heart disease in his father; Kidney disease in his sister.  Current Medications Current Medications[1]  Patients Current Diet:  Diet Order             Diet Heart Room service appropriate? Yes; Fluid consistency: Thin  Diet effective now                   Precautions / Restrictions Precautions Precautions: Fall, Sternal Precaution Booklet Issued: Yes (comment) Precaution/Restrictions Comments: chest tube Restrictions Weight Bearing Restrictions Per Provider Order: No RUE Weight Bearing Per Provider Order: Non weight bearing LUE Weight Bearing Per Provider Order: Non weight bearing   Has the patient had 2 or more falls or a fall with injury in the past year? No  Prior Activity Level Community (5-7x/wk): Pt. active in the community PTA  Prior Functional Level Self Care: Did the patient need help bathing, dressing, using the toilet or eating? Independent  Indoor Mobility: Did the patient need assistance with walking from room to room (with or without device)? Independent  Stairs: Did the patient need assistance with internal or external stairs (with or without device)? Independent  Functional Cognition: Did the patient need help planning regular tasks such as shopping or remembering to take medications? Independent  Patient Information Are you of Hispanic, Latino/a,or Spanish origin?: A. No, not of Hispanic, Latino/a, or Spanish origin What is your race?: A. White Do you need or want an interpreter to communicate with a doctor or health care staff?: 0. No  Patient's Response To:  Health Literacy and Transportation Is the patient able to respond to health literacy and transportation needs?: Yes Health Literacy - How often do you need to  have someone help you when you read instructions, pamphlets, or other written material from your doctor or pharmacy?: Never In the past 12 months, has lack of transportation kept you from medical appointments or from getting medications?: No In the past 12 months, has lack of transportation kept you from meetings, work, or from getting things needed for daily living?: No  Journalist, Newspaper / Equipment Home Equipment: Shower seat - built in  Prior Device Use: Indicate devices/aids used by the patient prior to current illness, exacerbation or injury? None of the above  Current Functional Level Cognition  Orientation Level: Oriented X4    Extremity Assessment (includes Sensation/Coordination)  Upper Extremity Assessment: Overall WFL for tasks assessed, Right hand dominant (MMT not formally done d/t sternal prec)  Lower Extremity Assessment: Defer to PT evaluation    ADLs  Overall ADL's : Needs assistance/impaired Eating/Feeding: Independent Grooming: Supervision/safety, Standing Upper  Body Bathing: Minimal assistance, Sitting Lower Body Bathing: Minimal assistance, Sit to/from stand, Sitting/lateral leans Upper Body Dressing : Minimal assistance Lower Body Dressing: Minimal assistance, Sitting/lateral leans, Sit to/from stand Lower Body Dressing Details (indicate cue type and reason): able to bring feet to self with effort Toilet Transfer: Contact guard assist, Ambulation, Rolling walker (2 wheels), +2 for safety/equipment Toileting- Clothing Manipulation and Hygiene: Minimal assistance, Sitting/lateral lean, Sit to/from stand Functional mobility during ADLs: Contact guard assist, Minimal assistance, +2 for safety/equipment, Cueing for safety General ADL Comments: Pt able to mobiilize around cardiac ICU using Elyn walker with RN providing chair follow. Cues for pacing needed, posture and reminders to avoid pushing with BUE. Provided sternal preca handout and initial education. Daughter  (paramedic) at bedside, present and reports family able to provide assist. Discussed obtaining handheld shower head to improve ability to safely reach all areas while in shower w/o breaking precautions    Mobility  Overal bed mobility: Needs Assistance Bed Mobility: Supine to Sit, Sit to Supine Supine to sit: Mod assist Sit to supine: Mod assist, +2 for safety/equipment, +2 for physical assistance General bed mobility comments: in recliner on entry    Transfers  Overall transfer level: Needs assistance Equipment used:  Winfred walker) Transfers: Sit to/from Stand Sit to Stand: Contact guard assist Bed to/from chair/wheelchair/BSC transfer type:: Step pivot Step pivot transfers: Mod assist, +2 physical assistance, +2 safety/equipment General transfer comment: cues to hold to heart pillow with pt able to quickly and easily stand from recliner    Ambulation / Gait / Stairs / Wheelchair Mobility  Ambulation/Gait Pre-gait activities: Pt was able to take steps from EOB to [facility] with heavy mod A for wgt shifting and step by step cues for sequencing and side stepping at EOB.    Posture / Balance Balance Overall balance assessment: Needs assistance Sitting-balance support: No upper extremity supported, Feet supported Sitting balance-Leahy Scale: Fair Standing balance support: No upper extremity supported, Bilateral upper extremity supported, During functional activity Standing balance-Leahy Scale: Fair Standing balance comment: Min to Mod A for balance in standing.    Special considerations/life events  Skin *** and Special service needs ***   Previous Home Environment (from acute therapy documentation) Living Arrangements: Spouse/significant other Available Help at Discharge: Family, Available 24 hours/day Type of Home: House Home Layout: One level Home Access: Stairs to enter Entrance Stairs-Rails: Right Entrance Stairs-Number of Steps: 3 Bathroom Shower/Tub: Therapist, Art: Standard Bathroom Accessibility: Yes  Discharge Living Setting Plans for Discharge Living Setting: House Type of Home at Discharge: House Discharge Home Layout: One level Discharge Home Access: Stairs to enter Entrance Stairs-Rails: Left, Right Entrance Stairs-Number of Steps: 3 Discharge Bathroom Shower/Tub: Walk-in shower Discharge Bathroom Toilet: Standard Discharge Bathroom Accessibility: Yes How Accessible: Accessible via walker Does the patient have any problems obtaining your medications?: No  Social/Family/Support Systems Patient Roles: Spouse Contact Information: [phone] Anticipated Caregiver: Doyal Anticipated Caregiver's Contact Information: Wife and daughters 24/7 Ability/Limitations of Caregiver: Mod A Caregiver Availability: 24/7 Discharge Plan Discussed with Primary Caregiver: Yes Is Caregiver In Agreement with Plan?: Yes Does Caregiver/Family have Issues with Lodging/Transportation while Pt is in Rehab?: No  Goals Patient/Family Goal for Rehab: PT/OT  Supervision level Expected length of stay: 10-12 days Pt/Family Agrees to Admission and willing to participate: Yes Program Orientation Provided & Reviewed with Pt/Caregiver Including Roles  & Responsibilities: Yes  Decrease burden of Care through IP rehab admission: Not anticipated  Possible need for SNF placement upon discharge:  Not anticipated  Patient Condition: {PATIENT'S CONDITION:22832}  Preadmission Screen Completed By:  Leita KATHEE Kleine, 01/16/2025 2:25 PM ______________________________________________________________________   Discussed status with Dr. PIERRETTE on *** at *** and received approval for admission today.  Admission Coordinator:  Leita KATHEE Kleine, CCC-SLP, time PIERRETTEPattricia ***   Assessment/Plan: Diagnosis: *** Does the need for close, 24 hr/day Medical supervision in concert with the patient's rehab needs make it unreasonable for this patient to be served in a less intensive setting?  {yes_no_potentially:3041433} Co-Morbidities requiring supervision/potential complications: *** Due to {due un:6958565}, does the patient require 24 hr/day rehab nursing? {yes_no_potentially:3041433} Does the patient require coordinated care of a physician, rehab nurse, PT, OT, and SLP to address physical and functional deficits in the context of the above medical diagnosis(es)? {yes_no_potentially:3041433} Addressing deficits in the following areas: {deficits:3041436} Can the patient actively participate in an intensive therapy program of at least 3 hrs of therapy 5 days a week? {yes_no_potentially:3041433} The potential for patient to make measurable gains while on inpatient rehab is {potential:3041437} Anticipated functional outcomes upon discharge from inpatient rehab: {functional outcomes:304600100} PT, {functional outcomes:304600100} OT, {functional outcomes:304600100} SLP Estimated rehab length of stay to reach the above functional goals is: *** Anticipated discharge destination: {anticipated dc setting:21604} 10. Overall Rehab/Functional Prognosis: {potential:3041437}   MD Signature: ***    [1]  Current Facility-Administered Medications:    acetaminophen  (TYLENOL ) tablet 1,000 mg, 1,000 mg, Oral, Q6H, 1,000 mg at 01/16/25 1347 **OR** acetaminophen  (TYLENOL ) 160 MG/5ML solution 1,000 mg, 1,000 mg, Per Tube, Q6H, Lightfoot, Harrell O, MD, 1,000 mg at 01/14/25 2342   albuterol  (PROVENTIL ) (2.5 MG/3ML) 0.083% nebulizer solution 3 mL, 3 mL, Inhalation, Q4H PRN, Barrett, Erin R, PA-C   amiodarone  (NEXTERONE  PREMIX) 360-4.14 MG/200ML-% (1.8 mg/mL) IV infusion, 30 mg/hr, Intravenous, Continuous, Lightfoot, Harrell O, MD, Last Rate: 16.67 mL/hr at 01/16/25 1400, 30 mg/hr at 01/16/25 1400   arformoterol  (BROVANA ) nebulizer solution 15 mcg, 15 mcg, Nebulization, BID, Gretta Leita SQUIBB, DO, 15 mcg at 01/16/25 9188   aspirin  EC tablet 325 mg, 325 mg, Oral, Daily, 325 mg at 01/16/25 0859 **OR** aspirin   chewable tablet 324 mg, 324 mg, Per Tube, Daily, Barrett, Erin R, PA-C, 324 mg at 01/14/25 1015   atorvastatin  (LIPITOR) tablet 40 mg, 40 mg, Oral, QPM, Salam, Savannah B, RPH, 40 mg at 01/15/25 1721   bisacodyl  (DULCOLAX) EC tablet 10 mg, 10 mg, Oral, Daily, 10 mg at 01/15/25 1202 **OR** bisacodyl  (DULCOLAX) suppository 10 mg, 10 mg, Rectal, Daily, Barrett, Erin R, PA-C   chlorhexidine  (HIBICLENS ) 4 % liquid 2 Application, 30 mL, Topical, UD, Lightfoot, Harrell O, MD   Chlorhexidine  Gluconate Cloth 2 % PADS 6 each, 6 each, Topical, Daily, Lightfoot, Harrell O, MD, 6 each at 01/16/25 0918   docusate sodium  (COLACE) capsule 200 mg, 200 mg, Oral, Daily, Salam, Savannah B, RPH, 200 mg at 01/15/25 1202   enoxaparin  (LOVENOX ) injection 40 mg, 40 mg, Subcutaneous, QHS, Lightfoot, Harrell O, MD, 40 mg at 01/15/25 2156   feeding supplement (ENSURE PLUS HIGH PROTEIN) liquid 237 mL, 237 mL, Oral, BID BM, Lightfoot, Harrell O, MD, 237 mL at 01/16/25 1354   fentaNYL  (SUBLIMAZE ) injection 12.5-25 mcg, 12.5-25 mcg, Intravenous, Q4H PRN, Smith, Joshua C, NP, 25 mcg at 01/14/25 2329   icosapent  Ethyl (VASCEPA ) 1 g capsule 2 g, 2 g, Oral, BID, Clark, Herby Amick P, DO, 2 g at 01/16/25 0859   CBG monitoring, , , 4x Daily, AC & HS **AND** insulin  aspart (novoLOG ) injection 0-24 Units, 0-24 Units, Subcutaneous,  TID WC, Lightfoot, Harrell O, MD   levETIRAcetam  (KEPPRA ) tablet 500 mg, 500 mg, Oral, BID, Gretta Leita SQUIBB, DO   metoprolol  tartrate (LOPRESSOR ) injection 2.5-5 mg, 2.5-5 mg, Intravenous, Q2H PRN, Barrett, Erin R, PA-C   metoprolol  tartrate (LOPRESSOR ) tablet 12.5 mg, 12.5 mg, Oral, BID, 12.5 mg at 01/15/25 2214 **OR** [DISCONTINUED] metoprolol  tartrate (LOPRESSOR ) 25 mg/10 mL oral suspension 12.5 mg, 12.5 mg, Per Tube, BID, Barrett, Erin R, PA-C   naloxegol  oxalate (MOVANTIK ) tablet 25 mg, 25 mg, Oral, Daily, Gretta Leita P, DO, 25 mg at 01/16/25 9140   norepinephrine  (LEVOPHED ) 4mg  in (0.016 mg/mL) premix  infusion, 0-40 mcg/min, Intravenous, Titrated, Gretta Leita SQUIBB, DO, Stopped at 01/15/25 9156   ondansetron  (ZOFRAN ) injection 4 mg, 4 mg, Intravenous, Q6H PRN, Barrett, Erin R, PA-C   Oral care mouth rinse, 15 mL, Mouth Rinse, PRN, Lightfoot, Harrell O, MD   Oral care mouth rinse, 15 mL, Mouth Rinse, PRN, Lightfoot, Harrell O, MD   Oral care mouth rinse, 15 mL, Mouth Rinse, QID, Lightfoot, Harrell O, MD, 15 mL at 01/16/25 1348   oxyCODONE  (Oxy IR/ROXICODONE ) immediate release tablet 10 mg, 10 mg, Oral, Q8H, Clark, Clarann Helvey P, DO, 10 mg at 01/16/25 1423   oxyCODONE  (Oxy IR/ROXICODONE ) immediate release tablet 5-10 mg, 5-10 mg, Oral, Q3H PRN, Salam, Savannah B, RPH, 10 mg at 01/16/25 1104   pantoprazole  (PROTONIX ) EC tablet 40 mg, 40 mg, Oral, QHS, Lightfoot, Harrell O, MD   revefenacin  (YUPELRI ) nebulizer solution 175 mcg, 175 mcg, Nebulization, Daily, Gretta Leita SQUIBB, DO, 175 mcg at 01/16/25 9188   sodium chloride  flush (NS) 0.9 % injection 3 mL, 3 mL, Intravenous, Q12H, Barrett, Erin R, PA-C, 3 mL at 01/16/25 0919   sodium chloride  flush (NS) 0.9 % injection 3 mL, 3 mL, Intravenous, PRN, Barrett, Erin R, PA-C   tamsulosin  (FLOMAX ) capsule 0.4 mg, 0.4 mg, Oral, Daily, Gretta Leita P, DO, 0.4 mg at 01/16/25 1348

## 2025-01-16 NOTE — Evaluation (Addendum)
 Occupational Therapy Evaluation Patient Details Name: Anthony Paul MRN: [id number] DOB: 01-15-1956 Today's Date: 01/16/2025   History of Present Illness   Pt is 69 yo presenting to [facility] on 1/26 for scheduled surgery. Pt currently s/p AVR on 1/26; extubated 1/27 then had sudden tonic clonic seizures and required re-intubation. Pt extubated 1/29. Possible small CVA PMH: ascending aortia dilatation, bicuspid aortic valve, BPH, chronic abdominal pain, BPH, COPD, DJD, insomnia, lumbar spondylosis, mixed hyperlipidemia, prediabetes, Vit B12     Clinical Impressions PTA, pt lives with spouse, typically completely Independent without AD at baseline. Pt presents now with minor deficits in endurance, strength, balance and cognition. Pt requiring frequent reminders to implement sternal precautions though moving well. Pt able to mobilize in hallway using Elyn walker with CGA x 2 with RN providing chair follow. One instance of Min A for balance correction when turning with Elyn walker and frequent cues to slow down pace. Provided sternal precaution handout and initial education for ADLs in which pt requires overall Min A. Daughter at bedside reports good family support upon DC. Anticipate pt will be able to DC directly home with [facility] pending ongoing acute progress.  VSS on RA     If plan is discharge home, recommend the following:   A little help with bathing/dressing/bathroom;Assistance with cooking/housework;Assist for transportation;Help with stairs or ramp for entrance     Functional Status Assessment   Patient has had a recent decline in their functional status and demonstrates the ability to make significant improvements in function in a reasonable and predictable amount of time.     Equipment Recommendations   Other (comment) (RW; TBD)     Recommendations for Other Services         Precautions/Restrictions   Precautions Precautions: Fall;Sternal Precaution Booklet Issued: Yes  (comment) Recall of Precautions/Restrictions: Impaired Precaution/Restrictions Comments: chest tube Restrictions Weight Bearing Restrictions Per Provider Order: No     Mobility Bed Mobility               General bed mobility comments: in recliner on entry    Transfers Overall transfer level: Needs assistance Equipment used:  Winfred walker) Transfers: Sit to/from Stand Sit to Stand: Contact guard assist           General transfer comment: cues to hold to heart pillow with pt able to quickly and easily stand from recliner      Balance Overall balance assessment: Needs assistance Sitting-balance support: No upper extremity supported, Feet supported Sitting balance-Leahy Scale: Fair     Standing balance support: No upper extremity supported, Bilateral upper extremity supported, During functional activity Standing balance-Leahy Scale: Fair                             ADL either performed or assessed with clinical judgement   ADL Overall ADL's : Needs assistance/impaired Eating/Feeding: Independent   Grooming: Supervision/safety;Standing   Upper Body Bathing: Minimal assistance;Sitting   Lower Body Bathing: Minimal assistance;Sit to/from stand;Sitting/lateral leans   Upper Body Dressing : Minimal assistance   Lower Body Dressing: Minimal assistance;Sitting/lateral leans;Sit to/from stand Lower Body Dressing Details (indicate cue type and reason): able to bring feet to self with effort Toilet Transfer: Contact guard assist;Ambulation;Rolling walker (2 wheels);+2 for safety/equipment   Toileting- Clothing Manipulation and Hygiene: Minimal assistance;Sitting/lateral lean;Sit to/from stand       Functional mobility during ADLs: Contact guard assist;Minimal assistance;+2 for safety/equipment;Cueing for safety General ADL Comments: Pt able  to mobiilize around cardiac ICU using Elyn walker with RN providing chair follow. Cues for pacing needed, posture and  reminders to avoid pushing with BUE. Provided sternal preca handout and initial education. Daughter (paramedic) at bedside, present and reports family able to provide assist. Discussed obtaining handheld shower head to improve ability to safely reach all areas while in shower w/o breaking precautions     Vision Ability to See in Adequate Light: 0 Adequate Patient Visual Report: No change from baseline Vision Assessment?: No apparent visual deficits     Perception         Praxis         Pertinent Vitals/Pain Pain Assessment Pain Assessment: No/denies pain     Extremity/Trunk Assessment Upper Extremity Assessment Upper Extremity Assessment: Overall WFL for tasks assessed;Right hand dominant (MMT not formally done d/t sternal prec)   Lower Extremity Assessment Lower Extremity Assessment: Defer to PT evaluation   Cervical / Trunk Assessment Cervical / Trunk Assessment: Normal   Communication Communication Communication: Impaired Factors Affecting Communication: Hearing impaired   Cognition Arousal: Alert Behavior During Therapy: WFL for tasks assessed/performed Cognition: Cognition impaired     Awareness: Online awareness impaired Memory impairment (select all impairments): Working memory Attention impairment (select first level of impairment): Selective attention Executive functioning impairment (select all impairments): Organization, Reasoning OT - Cognition Comments: pleasant, able to provide PLOF info. Cues/reminders needed for sternal precautions throughout, as well as pacing/posture cues                 Following commands: Intact       Cueing  General Comments   Cueing Techniques: Verbal cues      Exercises     Shoulder Instructions      Home Living Family/patient expects to be discharged to:: Private residence Living Arrangements: Spouse/significant other Available Help at Discharge: Family;Available 24 hours/day Type of Home: House Home  Access: Stairs to enter [employer] of Steps: 3 Entrance Stairs-Rails: Right Home Layout: One level     Bathroom Shower/Tub: Producer, Television/film/video: Standard Bathroom Accessibility: Yes   Home Equipment: Shower seat - built in          Prior Functioning/Environment Prior Level of Function : Independent/Modified Independent;Driving             Mobility Comments: no AD ADLs Comments: Indep with ADLs, IADLs, driving. active    OT Problem List: Decreased strength;Decreased activity tolerance;Impaired balance (sitting and/or standing);Decreased cognition;Decreased knowledge of precautions;Decreased knowledge of use of DME or AE;Cardiopulmonary status limiting activity   OT Treatment/Interventions: Therapeutic exercise;Self-care/ADL training;Energy conservation;DME and/or AE instruction;Therapeutic activities;Patient/family education;Balance training      OT Goals(Current goals can be found in the care plan section)   Acute Rehab OT Goals Patient Stated Goal: be able to go home OT Goal Formulation: With patient Time For Goal Achievement: 01/30/25 Potential to Achieve Goals: Good   OT Frequency:  Min 2X/week    Co-evaluation              AM-PAC OT 6 Clicks Daily Activity     Outcome Measure Help from another person eating meals?: None Help from another person taking care of personal grooming?: A Little Help from another person toileting, which includes using toliet, bedpan, or urinal?: A Little Help from another person bathing (including washing, rinsing, drying)?: A Little Help from another person to put on and taking off regular upper body clothing?: A Little Help from another person to put on and  taking off regular lower body clothing?: A Little 6 Click Score: 19   End of Session Equipment Utilized During Treatment: Gait belt;Other (comment) Winfred walker) Nurse Communication: Mobility status  Activity Tolerance: Patient tolerated  treatment well Patient left: in chair;with call bell/phone within reach;with family/visitor present  OT Visit Diagnosis: Unsteadiness on feet (R26.81);Muscle weakness (generalized) (M62.81)                Time: 8895-8867 OT Time Calculation (min): 28 min Charges:  OT General Charges $OT Visit: 1 Visit OT Evaluation $OT Eval Moderate Complexity: 1 Mod OT Treatments $Therapeutic Activity: 8-22 mins  Mliss NOVAK, OTR/L Acute Rehab Services Office: [phone]   Mliss Fish 01/16/2025, 12:41 PM

## 2025-01-16 NOTE — Progress Notes (Signed)
 Speech Language Pathology Treatment: Dysphagia  Patient Details Name: Anthony Paul MRN: [id number] DOB: 08-Jan-1956 Today's Date: 01/16/2025 Time: 1140-1155 SLP Time Calculation (min) (ACUTE ONLY): 15 min  Assessment / Plan / Recommendation Clinical Impression  Skilled observation of swallowing abilities complete during lunch. Patient able to self feed regular texture solids and thin liquids with appropriate mastication of bolus and no overt s/s of aspiration across consistencies. Per patient and family, no difficulty swallowing observed since placed on a diet yesterday. No further SLP needs indicated for dysphagia.   Discussed potential need for cognitive-linguistic evaluation given mild confusion noted yesterday. Per family and patient, they feel that he is currently at his baseline. Will defer request for eval at this time however if concerns arise, please order.     HPI HPI: 69 year old male with past medical history of prediabetes, BPH, hyperlipidemia, tobacco use and COPD, bicuspid aortic valve with severe AS who presents for AVR 1/26. 1/27 had a sudden tonic clonic seizure requiring intubation. MRI 1/27 with Patchy small volume restricted diffusion involving the posterior left temporoccipital region, favored to reflect a small acute ischemic infarct.      SLP Plan  All goals met        Swallow Evaluation Recommendations   Recommendations: PO diet PO Diet Recommendation: Regular;Thin liquids (Level 0) Liquid Administration via: Cup;Straw Medication Administration: Whole meds with liquid Supervision: Patient able to self-feed Postural changes: Position pt fully upright for meals Oral care recommendations: Oral care BID (2x/day)     Recommendations                     Oral care BID     Dysphagia, unspecified (R13.10)     All goals met    Anthony Paul, CCC-SLP  Anthony Paul  01/16/2025, 11:55 AM

## 2025-01-16 NOTE — Progress Notes (Signed)
 Patient ID: Anthony Paul, male   DOB: 09/15/1956, 69 y.o.   MRN: [id number]  TCTS Evening Rounds:  Hemodynamically stable. Was in AF earlier but now NSR 80's on IV amio.  Good UO.  Bowels working.  Hgb 7.9 after transfusion this am.  Walked with OT today.

## 2025-01-16 NOTE — Progress Notes (Signed)
" ° °   °  [address] [place] [address]             [phone]                3 Days Post-Op Procedures (LRB): REPLACEMENT, AORTIC VALVE, OPEN UTILIZING 25 MM  INSPIRIS RESILIA  AORTIC VALVE (N/A) ECHOCARDIOGRAM, TRANSESOPHAGEAL, INTRAOPERATIVE (N/A)   Events: No events _______________________________________________________________ Vitals: BP 131/64   Pulse 71   Temp 98.4 F (36.9 C) (Oral)   Resp 20   Ht 6' 1 (1.854 m)   Wt 108.1 kg   SpO2 95%   BMI 31.44 kg/m  Filed Weights   01/13/25 0825 01/15/25 0500 01/16/25 0630  Weight: 105.2 kg 104.3 kg 108.1 kg     - Neuro: alert NAD  - Cardiovascular: sinus  Drips: amio, 30    - Pulm: EWOB    ABG    Component Value Date/Time   PHART 7.424 01/14/2025 1205   PCO2ART 36.4 01/14/2025 1205   PO2ART 171 (H) 01/14/2025 1205   HCO3 26.5 01/15/2025 0851   TCO2 28 01/15/2025 0851   ACIDBASEDEF 1.0 01/14/2025 0809   O2SAT 59 01/15/2025 0851    - Abd: ND - Extremity: trace edema  .Intake/Output      01/28 0701 01/29 0700 01/29 0701 01/30 0700   P.O. 70    I.V. (mL/kg) 1597.1 (14.8) 121.5 (1.1)   Blood  172   IV Piggyback 27.5 5   Total Intake(mL/kg) 1694.6 (15.7) 298.5 (2.8)   Urine (mL/kg/hr) 1350 (0.5) 350 (0.8)   Stool 0 0   Chest Tube 240 40   Total Output 1590 390   Net +104.6 -91.5        Urine Occurrence 1 x    Stool Occurrence 2 x 1 x      _______________________________________________________________ Labs:    Latest Ref Rng & Units 01/16/2025    4:57 AM 01/16/2025    3:58 AM 01/15/2025    8:51 AM  CBC  WBC 4.0 - 10.5 K/uL 8.6  9.2    Hemoglobin 13.0 - 17.0 g/dL 6.3  6.4  7.1   Hematocrit 39.0 - 52.0 % 18.7  19.1  21.0   Platelets 150 - 400 K/uL 72  71        Latest Ref Rng & Units 01/16/2025    3:58 AM 01/15/2025    8:51 AM 01/15/2025    5:42 AM  CMP  Glucose 70 - 99 mg/dL 881   874   BUN 8 - 23 mg/dL 23   20   Creatinine 9.38 - 1.24 mg/dL 8.86   8.63   Sodium  864 - 145 mmol/L 138  141  142   Potassium 3.5 - 5.1 mmol/L 3.9  3.6  3.9   Chloride 98 - 111 mmol/L 103   106   CO2 22 - 32 mmol/L 28   26   Calcium  8.9 - 10.3 mg/dL 8.0   7.9     CXR:  Right effusion _______________________________________________________________  Assessment and Plan: POD 3 s/p AVR.  Small L temporal CVA  Neuro: appreciate neuro.  Will keep Keppra  on board.  PT/OT CV: back in sinus, will keep amio.  BB Pulm: IS, ambulation Renal: will diurese GI: ok for diet Heme: transfusing 1 unit ID: afebrile Endo: SSI Dispo: continue ICU care   Anthony Paul 01/16/2025 10:55 AM   "

## 2025-01-16 NOTE — Procedures (Addendum)
 Patient Name: Anthony Paul  MRN: [id number]  Epilepsy Attending: Arlin MALVA Krebs  Referring Physician/Provider: Khaliqdina, Salman, MD  Duration: 01/15/2025 1045 to 01/15/2025  1349   Patient history: 69yo M with witnessed tonic-clonic seizure lasting for about 1 minute. EEG to evaluate for seizure   Level of alertness: awake   AEDs during EEG study: LEV   Technical aspects: This EEG study was done with scalp electrodes positioned according to the 10-20 International system of electrode placement. Electrical activity was reviewed with band pass filter of 1-70Hz , sensitivity of 7 uV/mm, display speed of 58mm/sec with a 60Hz  notched filter applied as appropriate. EEG data were recorded continuously and digitally stored.  Video monitoring was available and reviewed as appropriate.   Description: The posterior dominant rhythm consists of 8-9 Hz activity of moderate voltage (25-35 uV) seen predominantly in posterior head regions, symmetric and reactive to eye opening and eye.  EEG also showed intermittent generalized 3 to 6 Hz theta-delta slowing. Hyperventilation and photic stimulation were not performed.   ABNORMALITY - Intermittent slow, generalized   IMPRESSION: This study was suggestive of mild diffuse encephalopathy.  No seizures or epileptiform discharges were seen throughout the recording.   Sayer Masini O Ayomikun Starling

## 2025-01-16 NOTE — Progress Notes (Addendum)
 STROKE TEAM PROGRESS NOTE    SIGNIFICANT HOSPITAL EVENTS 1/26: Patient underwent aortic valve replacement, found to have weakness in left hand and left foot postoperatively 1/27: Patient had seizure and was found to have a small infarct in the left posterior temporal occipital region  INTERIM HISTORY/SUBJECTIVE  Patient remains hemodynamically stable and afebrile.  He continues on amiodarone  for A-fib and has converted to normal sinus rhythm.  He states his weakness has resolved.  CT angiogram shows Minor atheromatous changes without large vessel stenosis or occlusion OBJECTIVE  CBC    Component Value Date/Time   WBC 8.6 01/16/2025 0457   RBC 1.93 (L) 01/16/2025 0457   HGB 6.3 (LL) 01/16/2025 0457   HGB 15.4 09/18/2024 1215   HCT 18.7 (L) 01/16/2025 0457   HCT 46.5 09/18/2024 1215   PLT 72 (L) 01/16/2025 0457   PLT 181 09/18/2024 1215   MCV 96.9 01/16/2025 0457   MCV 97 09/18/2024 1215   MCH 32.6 01/16/2025 0457   MCHC 33.7 01/16/2025 0457   RDW 13.1 01/16/2025 0457   RDW 12.5 09/18/2024 1215   LYMPHSABS 1.3 11/20/2024 1343   MONOABS 0.6 11/20/2024 1343   EOSABS 0.1 11/20/2024 1343   BASOSABS 0.0 11/20/2024 1343    BMET    Component Value Date/Time   NA 138 01/16/2025 0358   NA 141 09/18/2024 1215   K 3.9 01/16/2025 0358   CL 103 01/16/2025 0358   CO2 28 01/16/2025 0358   GLUCOSE 118 (H) 01/16/2025 0358   BUN 23 01/16/2025 0358   BUN 15 09/18/2024 1215   CREATININE 1.13 01/16/2025 0358   CREATININE 1.11 02/23/2024 1425   CALCIUM  8.0 (L) 01/16/2025 0358   EGFR 65 09/18/2024 1215   GFRNONAA >60 01/16/2025 0358   GFRNONAA 67 06/09/2021 1519    IMAGING past 24 hours CT ANGIO HEAD NECK W WO CM Result Date: 01/16/2025 EXAM: CT HEAD WITHOUT CTA HEAD AND NECK WITH AND WITHOUT 01/16/2025 10:21:51 AM TECHNIQUE: CTA of the head and neck was performed with and without the administration of 75 mL of intravenous contrast (iohexol  (OMNIPAQUE ) 350 MG/ML injection).  Noncontrast CT of the head with reconstructed 2-D images are also provided for review. Multiplanar 2D and/or 3D reformatted images are provided for review. Automated exposure control, iterative reconstruction, and/or weight based adjustment of the mA/kV was utilized to reduce the radiation dose to as low as reasonably achievable. COMPARISON: Head CT 01/13/2025 and MRI 01/14/2025 CLINICAL HISTORY: Transient ischemic attack (TIA). FINDINGS: CT HEAD: BRAIN AND VENTRICLES: At most subtle cortical/subcortical hypodensity is present in the left temporooccipital region to correspond with patchy restricted diffusion on MRI. No acute large territory infarct is evident by CT. No intracranial hemorrhage, mass, midline shift, hydrocephalus, or extra-axial fluid collection is identified. Cerebral volume is within normal limits for age. Periventricular white matter hypodensities are nonspecific but compatible with mild chronic small vessel ischemic disease. ORBITS: No acute abnormality. SINUSES AND MASTOIDS: No acute abnormality. CTA NECK: AORTIC ARCH AND ARCH VESSELS: No dissection or arterial injury. Mild stenosis of the distal left subclavian artery. CERVICAL CAROTID ARTERIES: Small amount of calcified plaque at the left carotid bifurcation. No dissection, arterial injury, or hemodynamically significant stenosis by NASCET criteria. CERVICAL VERTEBRAL ARTERIES: Moderately to strongly dominant right vertebral artery. No dissection, arterial injury, or significant stenosis. LUNGS AND MEDIASTINUM: Moderate right pleural effusion with associated atelectasis. Mild centrilobular and paraseptal emphysema. SOFT TISSUES: Right internal jugular venous sheath. BONES: Moderate to severe multilevel cervical disc degeneration. CTA HEAD:  ANTERIOR CIRCULATION: The intracranial internal carotid arteries are patent with minimal atherosclerosis not resulting in a significant stenosis. ACAs and MCAs are patent without evidence of a proximal branch  occlusion or significant proximal stenosis. The right A1 segment is hypoplastic. No aneurysm. POSTERIOR CIRCULATION: The intracranial vertebral arteries are widely patent to the basilar. Patent PICA and SCA origins are visualized bilaterally. The basilar artery is widely patent. There are large right and small left posterior communicating arteries with hypoplasia of the right P1 segment. Both PCAs are patent without evidence of a significant proximal stenosis. No aneurysm. OTHER: No dural venous sinus thrombosis on this non-dedicated study. IMPRESSION: 1. Minor atherosclerosis in the head and neck without a large vessel occlusion or significant stenosis. 2. Moderate right pleural effusion. 3. Pulmonary emphysema, which is an independent risk factor for lung cancer. Recommend consideration for evaluation for a low-dose CT lung cancer screening program. Electronically signed by: Dasie Hamburg MD 01/16/2025 11:44 AM EST RP Workstation: [id number]   DG CHEST PORT 1 VIEW Result Date: 01/16/2025 CLINICAL DATA:  Chest tube in place EXAM: PORTABLE CHEST 1 VIEW COMPARISON:  Portable chest 01/15/2025 FINDINGS: Interval removal of enteric tube and endotracheal tube. Indwelling right-sided chest tube in similar position. No pneumothorax bilaterally. Median sternotomy wires and mechanical cardiac valve again seen in similar configuration. Small bilateral pleural effusions persistent. Worsening right lower lobe opacification. IMPRESSION: 1. Interval removal of the endotracheal tube and pulmonary artery catheter. 2.  Stable position of right sided chest tube.  No pneumothorax. 3.  Worsening opacification of the right lung base. Electronically Signed   By: Cordella Banner   On: 01/16/2025 09:59    Vitals:   01/16/25 0930 01/16/25 1100 01/16/25 1104 01/16/25 1149  BP: 131/64 114/64    Pulse:  76 77 79  Resp:      Temp: 98.4 F (36.9 C)     TempSrc: Oral     SpO2:  97% 100% 91%  Weight:      Height:          PHYSICAL EXAM General:  Alert, well-nourished, well-developed patient in no acute distress Psych:  Mood and affect appropriate for situation CV: Regular rate and rhythm on monitor Respiratory:  Regular, unlabored respirations on room air   NEURO:  Mental Status: AA&Ox3, patient is able to give clear and coherent history Speech/Language: speech is without dysarthria or aphasia.    Cranial Nerves:  II: PERRL. Visual fields full.  III, IV, VI: EOMI. Eyelids elevate symmetrically.  VII: Face is symmetrical resting and smiling VIII: hearing intact to voice. IX, X: Phonation is normal.  XII: tongue is midline without fasciculations. Motor: Able to move all 4 extremities with symmetrical antigravity strength Tone: is normal and bulk is normal Sensation- Intact to light touch bilaterally.  Coordination: FTN intact bilaterally Gait- deferred  Most Recent NIH 0  ASSESSMENT/PLAN  Anthony Paul is a 68 y.o. male with history of prediabetes, tobacco use, COPD and aortic stenosis who was originally admitted for aortic valve replacement surgery.  He was noted to have left upper and lower extremity weakness postoperatively and also had a postoperative seizure.  He has been placed on Keppra .  MRI brain reveals patchy small volume acute infarct in left temporal occipital region.   Acute Ischemic Infarct:  left temporoccipital stroke Etiology: Likely due to perioperative embolism in the setting of aortic valve replacement surgery with air found in aortic line CT head No acute abnormality.  CTA head & neck  no LVO or hemodynamically significant stenosis MRI patchy small volume restricted diffusion involving posterior left temporal occipital region Intraoperative TEE moderate concentric LVH, EF 55 to 60%, normal left atrial size, no left atrial or left atrial appendage thrombus, bicuspid aortic valve LDL 59 HgbA1c 5.8 VTE prophylaxis -Lovenox  aspirin  81 mg daily prior to admission, now  on aspirin  325 mg daily, will need to transition to Eliquis given postoperative A-fib Therapy recommendations:  CIR Disposition: Pending  Atrial fibrillation Home Meds: None Continue telemetry monitoring Begin anticoagulation with Eliquis when ready from a surgical perspective  Hypertension Home meds: None Stable Blood Pressure Goal: BP less than 220/110   Hyperlipidemia Home meds: Atorvastatin  40 mg daily, Vascepa  2 g twice daily, resumed in hospital LDL 59, goal < 70 Continue statin at discharge  Seizure activity Patient had postoperative seizure requiring reintubation Continue Keppra  500 mg twice daily  Tobacco Abuse Patient smokes with 78-pack-year history Has reduced intake, will discuss cessation Nicotine  replacement therapy provided  Aortic valve replacement Patient underwent aortic valve replacement surgery on 1/26 Postoperative care per cardiothoracic surgery team  Other Stroke Risk Factors Obesity, Body mass index is 31.44 kg/m., BMI >/= 30 associated with increased stroke risk, recommend weight loss, diet and exercise as appropriate    Other Active Problems None  Hospital day # 3  Patient seen by NP with MD, MD to edit note as needed. Cortney E Everitt Clint Kill , MSN, AGACNP-BC [facility] See Amion for schedule and pager information 01/16/2025 12:09 PM  I have personally obtained history,examined this patient, reviewed notes, independently viewed imaging studies, participated in medical decision making and plan of care.ROS completed by me personally and pertinent positives fully documented  I have made any additions or clarifications directly to the above note. Agree with note above.  Patient presented with transient numbness which appears to have resolved but MRI scan shows a small left MCA branch infarct likely cardioembolic from aortic valve surgery patient also has A-fib post procedure.  Recommend aspirin  for now when safe consider switching to  Eliquis if cardiology agree.  Mobilize out of bed.  Repeat consuls.  Aggressive risk factor modification.  I had a long discussion with the patient and daughter at the bedside and answered questions..  Discussed with Dr. Leita Gaskins critical care medicine   I personally spent a total of 50 minutes in the care of the patient today including getting/reviewing separately obtained history, performing a medically appropriate exam/evaluation, counseling and educating, placing orders, referring and communicating with other health care professionals, documenting clinical information in the EHR, independently interpreting results, and coordinating care.   Stroke team will sign off.  Kindly call for questions     Eather Popp, MD Medical Director [facility] Pager: [phone] 01/16/2025 12:29 PM   To contact Stroke Continuity provider, please refer to [email or web address]. After hours, contact General Neurology

## 2025-01-17 ENCOUNTER — Other Ambulatory Visit: Payer: Self-pay | Admitting: Cardiology

## 2025-01-17 ENCOUNTER — Telehealth (HOSPITAL_COMMUNITY): Payer: Self-pay

## 2025-01-17 ENCOUNTER — Other Ambulatory Visit (HOSPITAL_COMMUNITY): Payer: Self-pay

## 2025-01-17 DIAGNOSIS — J9601 Acute respiratory failure with hypoxia: Secondary | ICD-10-CM | POA: Diagnosis not present

## 2025-01-17 DIAGNOSIS — R739 Hyperglycemia, unspecified: Secondary | ICD-10-CM | POA: Diagnosis not present

## 2025-01-17 DIAGNOSIS — I4891 Unspecified atrial fibrillation: Secondary | ICD-10-CM | POA: Diagnosis not present

## 2025-01-17 DIAGNOSIS — E669 Obesity, unspecified: Secondary | ICD-10-CM | POA: Diagnosis not present

## 2025-01-17 DIAGNOSIS — R569 Unspecified convulsions: Secondary | ICD-10-CM | POA: Diagnosis not present

## 2025-01-17 DIAGNOSIS — Z952 Presence of prosthetic heart valve: Secondary | ICD-10-CM

## 2025-01-17 DIAGNOSIS — J449 Chronic obstructive pulmonary disease, unspecified: Secondary | ICD-10-CM | POA: Diagnosis not present

## 2025-01-17 DIAGNOSIS — Z87891 Personal history of nicotine dependence: Secondary | ICD-10-CM | POA: Diagnosis not present

## 2025-01-17 DIAGNOSIS — J90 Pleural effusion, not elsewhere classified: Secondary | ICD-10-CM

## 2025-01-17 DIAGNOSIS — I639 Cerebral infarction, unspecified: Secondary | ICD-10-CM

## 2025-01-17 DIAGNOSIS — F17210 Nicotine dependence, cigarettes, uncomplicated: Secondary | ICD-10-CM | POA: Diagnosis not present

## 2025-01-17 DIAGNOSIS — I63412 Cerebral infarction due to embolism of left middle cerebral artery: Secondary | ICD-10-CM | POA: Diagnosis not present

## 2025-01-17 DIAGNOSIS — G4089 Other seizures: Secondary | ICD-10-CM | POA: Diagnosis not present

## 2025-01-17 DIAGNOSIS — R29700 NIHSS score 0: Secondary | ICD-10-CM | POA: Diagnosis not present

## 2025-01-17 DIAGNOSIS — I350 Nonrheumatic aortic (valve) stenosis: Secondary | ICD-10-CM | POA: Diagnosis not present

## 2025-01-17 DIAGNOSIS — E785 Hyperlipidemia, unspecified: Secondary | ICD-10-CM | POA: Diagnosis not present

## 2025-01-17 DIAGNOSIS — Z7982 Long term (current) use of aspirin: Secondary | ICD-10-CM | POA: Diagnosis not present

## 2025-01-17 DIAGNOSIS — Z6831 Body mass index (BMI) 31.0-31.9, adult: Secondary | ICD-10-CM | POA: Diagnosis not present

## 2025-01-17 DIAGNOSIS — N400 Enlarged prostate without lower urinary tract symptoms: Secondary | ICD-10-CM | POA: Diagnosis not present

## 2025-01-17 LAB — TYPE AND SCREEN
ABO/RH(D): O POS
Antibody Screen: NEGATIVE
Unit division: 0

## 2025-01-17 LAB — CBC
HCT: 21 % — ABNORMAL LOW (ref 39.0–52.0)
Hemoglobin: 7.3 g/dL — ABNORMAL LOW (ref 13.0–17.0)
MCH: 31.9 pg (ref 26.0–34.0)
MCHC: 34.8 g/dL (ref 30.0–36.0)
MCV: 91.7 fL (ref 80.0–100.0)
Platelets: 103 10*3/uL — ABNORMAL LOW (ref 150–400)
RBC: 2.29 MIL/uL — ABNORMAL LOW (ref 4.22–5.81)
RDW: 15.3 % (ref 11.5–15.5)
WBC: 8.1 10*3/uL (ref 4.0–10.5)
nRBC: 0.5 % — ABNORMAL HIGH (ref 0.0–0.2)

## 2025-01-17 LAB — BASIC METABOLIC PANEL WITH GFR
Anion gap: 7 (ref 5–15)
BUN: 22 mg/dL (ref 8–23)
CO2: 29 mmol/L (ref 22–32)
Calcium: 8.4 mg/dL — ABNORMAL LOW (ref 8.9–10.3)
Chloride: 103 mmol/L (ref 98–111)
Creatinine, Ser: 1.06 mg/dL (ref 0.61–1.24)
GFR, Estimated: >60 mL/min
Glucose, Bld: 126 mg/dL — ABNORMAL HIGH (ref 70–99)
Potassium: 3.6 mmol/L (ref 3.5–5.1)
Sodium: 139 mmol/L (ref 135–145)

## 2025-01-17 LAB — GLUCOSE, CAPILLARY
Glucose-Capillary: 113 mg/dL — ABNORMAL HIGH (ref 70–99)
Glucose-Capillary: 106 mg/dL — ABNORMAL HIGH (ref 70–99)
Glucose-Capillary: 103 mg/dL — ABNORMAL HIGH (ref 70–99)
Glucose-Capillary: 93 mg/dL (ref 70–99)

## 2025-01-17 LAB — BPAM RBC
Blood Product Expiration Date: 202602212359
ISSUE DATE / TIME: 202601290753
Unit Type and Rh: 5100

## 2025-01-17 LAB — MAGNESIUM: Magnesium: 2.2 mg/dL (ref 1.7–2.4)

## 2025-01-17 MED ORDER — ASPIRIN 81 MG PO TBEC
81.0000 mg | DELAYED_RELEASE_TABLET | Freq: Every day | ORAL | Status: DC
  Administered 2025-01-17 – 2025-01-23 (×7): 81 mg via ORAL
  Filled 2025-01-17 (×7): qty 1

## 2025-01-17 MED ORDER — AMIODARONE HCL 200 MG PO TABS
400.0000 mg | ORAL_TABLET | Freq: Two times a day (BID) | ORAL | Status: DC
  Administered 2025-01-17 – 2025-01-21 (×9): 400 mg via ORAL
  Filled 2025-01-17 (×9): qty 2

## 2025-01-17 MED ORDER — FENTANYL CITRATE (PF) 50 MCG/ML IJ SOSY: 25.0000 ug | PREFILLED_SYRINGE | Freq: Once | INTRAMUSCULAR | Status: DC

## 2025-01-17 MED ORDER — FUROSEMIDE 40 MG PO TABS
40.0000 mg | ORAL_TABLET | Freq: Every day | ORAL | Status: DC
  Administered 2025-01-17 – 2025-01-21 (×5): 40 mg via ORAL
  Filled 2025-01-17 (×5): qty 1

## 2025-01-17 MED ORDER — METOPROLOL TARTRATE 25 MG PO TABS
25.0000 mg | ORAL_TABLET | Freq: Two times a day (BID) | ORAL | Status: DC
  Administered 2025-01-17 – 2025-01-18 (×4): 25 mg via ORAL
  Filled 2025-01-17 (×4): qty 1

## 2025-01-17 MED ORDER — SODIUM CHLORIDE 0.9% FLUSH: 3.0000 mL | INTRAVENOUS | Status: DC | PRN

## 2025-01-17 MED ORDER — ~~LOC~~ CARDIAC SURGERY, PATIENT & FAMILY EDUCATION
Freq: Once | Status: AC
  Administered 2025-01-17: 1

## 2025-01-17 MED ORDER — SODIUM CHLORIDE 0.9% FLUSH
3.0000 mL | Freq: Two times a day (BID) | INTRAVENOUS | Status: DC
  Administered 2025-01-17 – 2025-01-23 (×13): 3 mL via INTRAVENOUS

## 2025-01-17 MED ORDER — POTASSIUM CHLORIDE CRYS ER 20 MEQ PO TBCR
40.0000 meq | EXTENDED_RELEASE_TABLET | Freq: Once | ORAL | Status: AC
  Administered 2025-01-17: 40 meq via ORAL
  Filled 2025-01-17: qty 2

## 2025-01-17 MED ORDER — AMIODARONE IV BOLUS ONLY 150 MG/100ML: 150.0000 mg | Freq: Once | INTRAVENOUS | Status: DC

## 2025-01-17 MED ORDER — INSULIN ASPART 100 UNIT/ML IJ SOLN: 0.0000 [IU] | Freq: Three times a day (TID) | INTRAMUSCULAR | Status: DC

## 2025-01-17 MED ORDER — AMIODARONE IV BOLUS ONLY 150 MG/100ML
150.0000 mg | Freq: Once | INTRAVENOUS | Status: AC
  Administered 2025-01-17: 150 mg via INTRAVENOUS
  Filled 2025-01-17: qty 100

## 2025-01-17 MED ORDER — STROKE: EARLY STAGES OF RECOVERY BOOK
Freq: Once | Status: AC
  Administered 2025-01-17: 1
  Filled 2025-01-17: qty 1

## 2025-01-17 MED ORDER — SODIUM CHLORIDE 0.9 % IV SOLN: 250.0000 mL | INTRAVENOUS | Status: AC | PRN

## 2025-01-17 MED ORDER — POTASSIUM CHLORIDE CRYS ER 20 MEQ PO TBCR: 40.0000 meq | EXTENDED_RELEASE_TABLET | Freq: Every day | ORAL | Status: DC

## 2025-01-17 MED ORDER — APIXABAN 5 MG PO TABS
5.0000 mg | ORAL_TABLET | Freq: Two times a day (BID) | ORAL | Status: DC
  Administered 2025-01-17 – 2025-01-23 (×13): 5 mg via ORAL
  Filled 2025-01-17 (×13): qty 1

## 2025-01-17 MED ORDER — POTASSIUM CHLORIDE CRYS ER 20 MEQ PO TBCR
40.0000 meq | EXTENDED_RELEASE_TABLET | Freq: Every day | ORAL | Status: DC
  Administered 2025-01-17 – 2025-01-20 (×4): 40 meq via ORAL
  Filled 2025-01-17 (×4): qty 2

## 2025-01-17 NOTE — Progress Notes (Signed)
" ° °   °  [address] [place] [address]             [phone]                4 Days Post-Op Procedures (LRB): REPLACEMENT, AORTIC VALVE, OPEN UTILIZING 25 MM  INSPIRIS RESILIA  AORTIC VALVE (N/A) ECHOCARDIOGRAM, TRANSESOPHAGEAL, INTRAOPERATIVE (N/A)   Events: Afib again overnight.  Back in sinus _______________________________________________________________ Vitals: BP 114/80   Pulse 75   Temp 97.9 F (36.6 C) (Oral)   Resp 19   Ht 6' 1 (1.854 m)   Wt 108.1 kg   SpO2 (!) 89%   BMI 31.45 kg/m  Filed Weights   01/15/25 0500 01/16/25 0630 01/17/25 0600  Weight: 104.3 kg 108.1 kg 108.1 kg     - Neuro: alert NAD  - Cardiovascular: sinus  Drips: amio, 30    - Pulm: EWOB    ABG    Component Value Date/Time   PHART 7.424 01/14/2025 1205   PCO2ART 36.4 01/14/2025 1205   PO2ART 171 (H) 01/14/2025 1205   HCO3 26.5 01/15/2025 0851   TCO2 28 01/15/2025 0851   ACIDBASEDEF 1.0 01/14/2025 0809   O2SAT 59 01/15/2025 0851    - Abd: ND - Extremity: trace edema  .Intake/Output      01/29 0701 01/30 0700 01/30 0701 01/31 0700   P.O.     I.V. (mL/kg) 422.7 (3.9)    Blood 172    IV Piggyback 5    Total Intake(mL/kg) 599.7 (5.5)    Urine (mL/kg/hr) 1450 (0.6)    Stool 0    Chest Tube 40    Total Output 1490    Net -890.4         Urine Occurrence 2 x    Stool Occurrence 3 x       _______________________________________________________________ Labs:    Latest Ref Rng & Units 01/17/2025    4:58 AM 01/16/2025   11:38 AM 01/16/2025    4:57 AM  CBC  WBC 4.0 - 10.5 K/uL 8.1   8.6   Hemoglobin 13.0 - 17.0 g/dL 7.3  7.9  6.3   Hematocrit 39.0 - 52.0 % 21.0  22.8  18.7   Platelets 150 - 400 K/uL 103   72       Latest Ref Rng & Units 01/17/2025    4:58 AM 01/16/2025    3:58 AM 01/15/2025    8:51 AM  CMP  Glucose 70 - 99 mg/dL 873  881    BUN 8 - 23 mg/dL 22  23    Creatinine 9.38 - 1.24 mg/dL 8.93  8.86    Sodium 864 - 145 mmol/L 139   138  141   Potassium 3.5 - 5.1 mmol/L 3.6  3.9  3.6   Chloride 98 - 111 mmol/L 103  103    CO2 22 - 32 mmol/L 29  28    Calcium  8.9 - 10.3 mg/dL 8.4  8.0      CXR: Stable _______________________________________________________________  Assessment and Plan: POD 4 s/p AVR.  Small L temporal CVA  Neuro: appreciate neuro.  Will keep Keppra  on board.  PT/OT CV: back in sinus, PO amio, titrating BB. Pulm: IS, ambulation Renal: will diurese GI: ok for diet Heme: stable ID: [email or web address] Endo: SSI Dispo: floor   Marshae Azam O Alacia Rehmann 01/17/2025 8:04 AM   "

## 2025-01-17 NOTE — Progress Notes (Signed)
 STROKE TEAM PROGRESS NOTE    SIGNIFICANT HOSPITAL EVENTS 1/26: Patient underwent aortic valve replacement, found to have weakness in left hand and left foot postoperatively 1/27: Patient had seizure and was found to have a small infarct in the left posterior temporal occipital region  INTERIM HISTORY/SUBJECTIVE  Patient remains hemodynamically stable and afebrile.  He continues to do well from neurological standpoint.  He states his weakness has resolved.  He has no complaints.  He is walking with the physical therapist.   OBJECTIVE  CBC    Component Value Date/Time   WBC 8.1 01/17/2025 0458   RBC 2.29 (L) 01/17/2025 0458   HGB 7.3 (L) 01/17/2025 0458   HGB 15.4 09/18/2024 1215   HCT 21.0 (L) 01/17/2025 0458   HCT 46.5 09/18/2024 1215   PLT 103 (L) 01/17/2025 0458   PLT 181 09/18/2024 1215   MCV 91.7 01/17/2025 0458   MCV 97 09/18/2024 1215   MCH 31.9 01/17/2025 0458   MCHC 34.8 01/17/2025 0458   RDW 15.3 01/17/2025 0458   RDW 12.5 09/18/2024 1215   LYMPHSABS 1.3 11/20/2024 1343   MONOABS 0.6 11/20/2024 1343   EOSABS 0.1 11/20/2024 1343   BASOSABS 0.0 11/20/2024 1343    BMET    Component Value Date/Time   NA 139 01/17/2025 0458   NA 141 09/18/2024 1215   K 3.6 01/17/2025 0458   CL 103 01/17/2025 0458   CO2 29 01/17/2025 0458   GLUCOSE 126 (H) 01/17/2025 0458   BUN 22 01/17/2025 0458   BUN 15 09/18/2024 1215   CREATININE 1.06 01/17/2025 0458   CREATININE 1.11 02/23/2024 1425   CALCIUM  8.4 (L) 01/17/2025 0458   EGFR 65 09/18/2024 1215   GFRNONAA >60 01/17/2025 0458   GFRNONAA 67 06/09/2021 1519    IMAGING past 24 hours No results found.   Vitals:   01/17/25 0630 01/17/25 0700 01/17/25 0817 01/17/25 0910  BP: 108/74 114/80 123/70   Pulse: 75  (!) 124 (!) 102  Resp:    19  Temp:      TempSrc:      SpO2: (!) 89%   92%  Weight:      Height:         PHYSICAL EXAM General:  Alert, well-nourished, well-developed patient in no acute distress Psych:  Mood  and affect appropriate for situation CV: Regular rate and rhythm on monitor Respiratory:  Regular, unlabored respirations on room air   NEURO:  Mental Status: AA&Ox3, patient is able to give clear and coherent history Speech/Language: speech is without dysarthria or aphasia.    Cranial Nerves:  II: PERRL. Visual fields full.  III, IV, VI: EOMI. Eyelids elevate symmetrically.  VII: Face is symmetrical resting and smiling VIII: hearing intact to voice. IX, X: Phonation is normal.  XII: tongue is midline without fasciculations. Motor: Able to move all 4 extremities with symmetrical antigravity strength Tone: is normal and bulk is normal Sensation- Intact to light touch bilaterally.  Coordination: FTN intact bilaterally Gait- deferred  Most Recent NIH 0  ASSESSMENT/PLAN  Mr. Anthony Paul is a 69 y.o. male with history of prediabetes, tobacco use, COPD and aortic stenosis who was originally admitted for aortic valve replacement surgery.  He was noted to have left upper and lower extremity weakness postoperatively and also had a postoperative seizure.  He has been placed on Keppra .  MRI brain reveals patchy small volume acute infarct in left temporal occipital region.   Acute Ischemic Infarct:  left temporoccipital  stroke Etiology: Likely due to perioperative embolism in the setting of aortic valve replacement surgery with air found in aortic line CT head No acute abnormality.  CTA head & neck no LVO or hemodynamically significant stenosis MRI patchy small volume restricted diffusion involving posterior left temporal occipital region Intraoperative TEE moderate concentric LVH, EF 55 to 60%, normal left atrial size, no left atrial or left atrial appendage thrombus, bicuspid aortic valve LDL 59 HgbA1c 5.8 VTE prophylaxis -Lovenox  aspirin  81 mg daily prior to admission, now on aspirin  325 mg daily, will need to transition to Eliquis  given postoperative A-fib Therapy recommendations:   CIR Disposition: Pending  Atrial fibrillation Home Meds: None Continue telemetry monitoring Begin anticoagulation with Eliquis  when ready from a surgical perspective  Hypertension Home meds: None Stable Blood Pressure Goal: BP less than 220/110   Hyperlipidemia Home meds: Atorvastatin  40 mg daily, Vascepa  2 g twice daily, resumed in hospital LDL 59, goal < 70 Continue statin at discharge  Seizure activity Patient had postoperative seizure requiring reintubation Continue Keppra  500 mg twice daily  Tobacco Abuse Patient smokes with 78-pack-year history Has reduced intake, will discuss cessation Nicotine  replacement therapy provided  Aortic valve replacement Patient underwent aortic valve replacement surgery on 1/26 Postoperative care per cardiothoracic surgery team  Other Stroke Risk Factors Obesity, Body mass index is 31.45 kg/m., BMI >/= 30 associated with increased stroke risk, recommend weight loss, diet and exercise as appropriate    Other Active Problems None  Hospital day # 4   Patient presented with transient numbness which appears to have resolved but MRI scan shows a small left MCA branch infarct likely cardioembolic from aortic valve surgery patient also has A-fib post procedure.  Recommend aspirin  for now when safe consider switching to Eliquis  if cardiology agree.  Mobilize out of bed.  Repeat consuls.  Aggressive risk factor modification.  I had a long discussion with the patient and daughter at the bedside and answered questions..  Discussed with patient and family at the bedside and answered questions.   I personally spent a total of 35 minutes in the care of the patient today including getting/reviewing separately obtained history, performing a medically appropriate exam/evaluation, counseling and educating, placing orders, referring and communicating with other health care professionals, documenting clinical information in the EHR, independently interpreting  results, and coordinating care.   Stroke team will sign off.  Kindly call for questions     Eather Popp, MD Medical Director [facility] Pager: [phone] 01/17/2025 11:25 AM   To contact Stroke Continuity provider, please refer to [email or web address]. After hours, contact General Neurology

## 2025-01-17 NOTE — Progress Notes (Signed)
 Physical Therapy Treatment Patient Details Name: Anthony Paul MRN: [id number] DOB: July 13, 1956 Today's Date: 01/17/2025   History of Present Illness Pt is 69 yo presenting to [facility] on 1/26 for scheduled surgery. Pt currently s/p AVR on 1/26; extubated 1/27 then had sudden tonic clonic seizures and required re-intubation. Pt extubated 1/29. Possible small CVA PMH: ascending aortia dilatation, bicuspid aortic valve, BPH, chronic abdominal pain, BPH, COPD, DJD, insomnia, lumbar spondylosis, mixed hyperlipidemia, prediabetes, Vit B12    PT Comments  Pt sitting up in recliner with wife and daughter present. Pt's daughter reports SoB and weakness with ambulation into the bathroom to brush his teeth. Reviewed sternal precautions with pt before moving but pt unable to carryover into actions, trying to pull up on arm rest to come to standing and later reaching behind his head to place a pillow. Pt starts out with strong, grossly steady gait but decreases speed of ambulation and stability with distance pt with reports of DoE and slight dizziness. PT cues for increased depth of breathing however pt unable to achieve. Hands are cold and unable to get good SpO2 reading but once good pleth achieved SpO2 >90%O2. Once back in room, seated pt breathing more natural and symptoms dissipate. Given pt decreased short term memory and balance and endurance deficits, d/c plans remain appropriate at this time. PT will continue to follow acutely.     If plan is discharge home, recommend the following: A little help with walking and/or transfers;Assistance with cooking/housework;Assist for transportation;Help with stairs or ramp for entrance   Can travel by private vehicle      Maybe  Equipment Recommendations  Rolling walker (2 wheels);BSC/3in1;Hospital bed    Recommendations for Other Services Rehab consult     Precautions / Restrictions Precautions Precautions: Fall;Sternal Precaution Booklet Issued: Yes  (comment) Recall of Precautions/Restrictions: Impaired Restrictions Weight Bearing Restrictions Per Provider Order: Yes RUE Weight Bearing Per Provider Order: Non weight bearing LUE Weight Bearing Per Provider Order: Non weight bearing     Mobility  Bed Mobility               General bed mobility comments: in recliner on entry    Transfers Overall transfer level: Needs assistance Equipment used: Rolling walker (2 wheels) Transfers: Sit to/from Stand Sit to Stand: Contact guard assist           General transfer comment: needs reminder about keeping UE close not using arm rest to push off    Ambulation/Gait Ambulation/Gait assistance: Min assist, Mod assist Gait Distance (Feet): 100 Feet Assistive device: Rolling walker (2 wheels) Gait Pattern/deviations: Step-through pattern, Decreased step length - right, Decreased step length - left (lean to left) Gait velocity: slowed Gait velocity interpretation: <1.31 ft/sec, indicative of household ambulator   General Gait Details: min A  to start progresing to modA as pt with reports of SoB, cues for purse lip breathing however pt unable to achieve,         Balance Overall balance assessment: Needs assistance Sitting-balance support: No upper extremity supported, Feet supported Sitting balance-Leahy Scale: Fair     Standing balance support: No upper extremity supported, Bilateral upper extremity supported, During functional activity Standing balance-Leahy Scale: Fair Standing balance comment: benefits from UE support with standing balance                            Communication Communication Communication: Impaired Factors Affecting Communication: Hearing impaired  Cognition Arousal: Alert Behavior  During Therapy: WFL for tasks assessed/performed   PT - Cognitive impairments: Safety/Judgement, Sequencing, Memory                       PT - Cognition Comments: continues to have difficulty  with recall of precautions, poor short term memory throughout Following commands: Intact      Cueing Cueing Techniques: Verbal cues     General Comments General comments (skin integrity, edema, etc.): increased pallor and SoB with ambulation, despite cues of deep breathing, pt with shallow breath, SpO2 in 90s when good pleth wave achieved and BP 136/78, pt with pitting edema in LE, cued for increased ankle pumps and LE elevation      Pertinent Vitals/Pain Pain Assessment Pain Assessment: Faces Faces Pain Scale: Hurts a little bit Pain Location: sternum Pain Descriptors / Indicators: Discomfort Pain Intervention(s): Limited activity within patient's tolerance, Monitored during session, Repositioned     PT Goals (current goals can now be found in the care plan section) Acute Rehab PT Goals Patient Stated Goal: improve mobility PT Goal Formulation: With patient Time For Goal Achievement: 01/29/25 Potential to Achieve Goals: Good Progress towards PT goals: Not progressing toward goals - comment    Frequency    Min 2X/week       AM-PAC PT 6 Clicks Mobility   Outcome Measure  Help needed turning from your back to your side while in a flat bed without using bedrails?: A Lot Help needed moving from lying on your back to sitting on the side of a flat bed without using bedrails?: A Lot Help needed moving to and from a bed to a chair (including a wheelchair)?: A Lot Help needed standing up from a chair using your arms (e.g., wheelchair or bedside chair)?: A Lot Help needed to walk in hospital room?: Total Help needed climbing 3-5 steps with a railing? : Total 6 Click Score: 10    End of Session   Activity Tolerance: Patient tolerated treatment well Patient left: in bed;with call bell/phone within reach;with nursing/sitter in room Nurse Communication: Mobility status PT Visit Diagnosis: Unsteadiness on feet (R26.81);Muscle weakness (generalized) (M62.81);Other abnormalities  of gait and mobility (R26.89);Pain Pain - part of body:  (sternum)     Time: 8897-8872 PT Time Calculation (min) (ACUTE ONLY): 25 min  Charges:    $Gait Training: 8-22 mins $Therapeutic Exercise: 8-22 mins PT General Charges $$ ACUTE PT VISIT: 1 Visit                     Densil Ottey B. Fleeta Lapidus PT, DPT Acute Rehabilitation Services Please use secure chat or  Call Office [phone]     Almarie KATHEE Fleeta [facility] 01/17/2025, 11:49 AM

## 2025-01-17 NOTE — Plan of Care (Signed)
 Progressing well. Ambulating approx. 215 feet x 3 today using walker. Patient remains impulsive at times. Needs lots of reminders/cues not to use arms.

## 2025-01-17 NOTE — Telephone Encounter (Signed)
 Pharmacy Patient Advocate Encounter  Insurance verification completed.    The patient is insured through [employer]. Patient has Medicare and is not eligible for a copay card, but may be able to apply for patient assistance or Medicare RX Payment Plan (Patient Must reach out to their plan, if eligible for payment plan), if available.    Ran test claim for Eliquis  5mg  tablet and the current 30 day co-pay is $249.70.  Ran test claim for Xarelto 20mg  tablet and the current 30 day co-pay is $207.43.  This test claim was processed through [facility]- copay amounts may vary at other pharmacies due to pharmacy/plan contracts, or as the patient moves through the different stages of their insurance plan.

## 2025-01-17 NOTE — Progress Notes (Signed)
 Post op 6 week echo ordered

## 2025-01-17 NOTE — Progress Notes (Signed)
" ° °  [address], [place] [address]             [phone]   POD # 4 AVR  Just coming back from walk  BP 122/75   Pulse 95   Temp 98.9 F (37.2 C) (Oral)   Resp 19   Ht 6' 1 (1.854 m)   Wt 108.1 kg   SpO2 95%   BMI 31.45 kg/m  In and out of A fib with controlled rate   Intake/Output Summary (Last 24 hours) at 01/17/2025 1653 Last data filed at 01/17/2025 1400 Gross per 24 hour  Intake 194.25 ml  Output 2300 ml  Net -2105.75 ml   CBG well controlled  Slowly progressing  Elspeth C. Kerrin, MD [facility] [phone]  "

## 2025-01-17 NOTE — TOC Progression Note (Signed)
 Transition of Care [facility]) - Progression Note    Patient Details  Name: Anthony Paul MRN: [id number] Date of Birth: March 03, 1956  Transition of Care [facility] CM/SW Contact  Arlana JINNY Nicholaus [place] Phone Number: [phone] 01/17/2025, 8:06 PM  Clinical Narrative:   Per chart review, patient is not medically stable for dc. Patient is a potential CIR candidate. ICM will continue to follow and monitor for dc readiness.  Unit CSW/CM will continue to follow and monitor for dc readiness.     Expected Discharge Plan: Home w Home Health Services Barriers to Discharge: Continued Medical Work up               Expected Discharge Plan and Services   Discharge Planning Services: CM Consult                                 [facility] Agency: [facility]         Social Drivers of Health (SDOH) Interventions SDOH Screenings   Food Insecurity: Patient Unable To Answer (01/14/2025)  Housing: Unknown (01/01/2025)  Transportation Needs: No Transportation Needs (01/01/2025)  Utilities: Not At Risk (01/01/2025)  Depression (PHQ2-9): Low Risk (01/01/2025)  Physical Activity: Inactive (01/01/2025)  Social Connections: Socially Integrated (01/01/2025)  Stress: No Stress Concern Present (01/01/2025)  Tobacco Use: High Risk (01/13/2025)  Health Literacy: Adequate Health Literacy (01/01/2025)    Readmission Risk Interventions     No data to display

## 2025-01-17 NOTE — Progress Notes (Signed)
 "  NAME:  Anthony Paul, MRN:  [id number], DOB:  1956-02-01, LOS: 4 ADMISSION DATE:  01/13/2025, CONSULTATION DATE:  1/26 REFERRING MD:  Shyrl, CHIEF COMPLAINT:  s/p AVR    History of Present Illness:  70 year old male with past medical history of prediabetes, BPH, hyperlipidemia, tobacco use and COPD, bicuspid aortic valve with severe AS who presents for AVR.   Followed by Dr. Delford with cardiology for murmur since 2014. Found to have bicuspid AV. Had serial echos showing worsening disease. He had RHC 09/2024 showing patent coronaries, normal RHC, calcified bicuspid aortic valve with restricted mobility. Recommending CVTS evaluation. 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 was seen by Dr. Shyrl TCTS 12/27/24 for AVR, now s/p replacement.  Pump time: 102 Xclamp time: 71 EBL: ~ 460 UOP:  950 cc  Products: no blood products   Cell saver: 230 cc   Pertinent  Medical History  prediabetes, BPH, hyperlipidemia, tobacco use and COPD, bicuspid aortic valve with severe AS  Significant Hospital Events: Including procedures, antibiotic start and stop dates in addition to other pertinent events   1/26: s/p AVR , extubated post-op. Had some L sided weakness> head CT ok, Neuro consulted. 1/27 early morning had sudden tonic clonic seizures, sudden shock, required intubation. cEEG. Swan & pacing wires out for MRI brain. 1/28 extubated to [place], weaned off NE 1/30 Patient has been in and out of Afibb. Likely will be transferred out of ICU.  Interim History / Subjective:  Patient is doing well.  Overnight he has been in and out of atrial fibrillation.  His hemoglobin has been stable.  He is been on Amio drip.  EEG is negative.  Objective   Blood pressure 138/75, pulse 95, temperature 98.1 F (36.7 C), temperature source Oral, resp. rate 19, height 6' 1 (1.854 m), weight 108.1 kg, SpO2 95%.    FiO2 (%):  [21 %] 21 %   Intake/Output Summary (Last 24  hours) at 01/17/2025 1429 Last data filed at 01/17/2025 1355 Gross per 24 hour  Intake 227.55 ml  Output 1900 ml  Net -1672.45 ml   Filed Weights   01/15/25 0500 01/16/25 0630 01/17/25 0600  Weight: 104.3 kg 108.1 kg 108.1 kg    Examination: Physical exam: General: Alert and oriented. HEENT: [place]/AT, eyes anicteric.  moist mucus membranes Neuro: Alert, awake following commands Chest: Coarse breath sounds, no wheezes or rhonchi Heart: Tachycardia and irregular hythm.  No murmurs or gallops Abdomen: Soft, nontender, nondistended, bowel sounds present  Resolved Hospital Problem list    Assessment & Plan:  Severe aortic stenosis s/p AVR ; EF 55-60% Bicuspid AV  A-fib with RVR Frequent PVCS , NSVT Post bypass vasoplegia -post-op management per TCTS -diuresis -On amiodarone  gtt. -d/c chest tubes -metoprolol  -Eliquis  started today -aspirin , statin -tele monitoring -pain control per protocol -mobilize and advance diet as able  New onset seizure, left side weakness; MRI with small ischemic stroke -keppra  -discussed seizure precautions; no driving x 6 months -Eliquis  started today -aspirin , statin -seizure precautions, midazolam  as needed -PT, OT -avoid all meds that lower seizure threshold-- con't holding tramadol  -CTA H&N today per stroke team  New onset Afib with RVR -On amiodarone  gtt. changed to oral -Eliquis  started today -additional empiric MG+  -will need AC  Acute respiratory failure with hypoxia due to hypoventilation with seizure -resolved COPD  Remote tobacco use  -bronchodilators -lasix  40mg  -wean O2  Hyperglycemia, h/o prediabetes  -SSI PRN -goal BG 140-180  HLD  -  con't statin  BPH  -tamsulosin   Anemia post op, not unexpected or clinically significant  -1 unit pRBC today   Plans to transfer out of ICU noted.  Will follow along as long as the patient is in ICU.  Labs   CBC: Recent Labs  Lab 01/14/25 1416 01/15/25 0542 01/15/25 0851  01/16/25 0358 01/16/25 0457 01/16/25 1138 01/17/25 0458  WBC 26.0* 21.0*  --  9.2 8.6  --  8.1  HGB 8.0* 7.7* 7.1* 6.4* 6.3* 7.9* 7.3*  HCT 22.5* 21.3* 21.0* 19.1* 18.7* 22.8* 21.0*  MCV 93.4 94.2  --  97.0 96.9  --  91.7  PLT 120* 88*  --  71* 72*  --  103*    Basic Metabolic Panel: Recent Labs  Lab 01/14/25 0503 01/14/25 0553 01/14/25 1558 01/15/25 0542 01/15/25 0851 01/16/25 0358 01/17/25 0458  NA 141   < > 141 142 141 138 139  K 3.6   < > 3.7 3.9 3.6 3.9 3.6  CL 108  --  106 106  --  103 103  CO2 21*  --  26 26  --  28 29  GLUCOSE 138*  --  146* 125*  --  118* 126*  BUN 18  --  21 20  --  23 22  CREATININE 1.14  --  1.37* 1.36*  --  1.13 1.06  CALCIUM  8.3*  --  8.2* 7.9*  --  8.0* 8.4*  MG 2.3  --  3.1* 2.5*  --  2.3 2.2  PHOS  --   --   --  2.5  --   --   --    < > = values in this interval not displayed.   GFR: Estimated Creatinine Clearance: 84.8 mL/min (by C-G formula based on SCr of 1.06 mg/dL). Recent Labs  Lab 01/15/25 0542 01/16/25 0358 01/16/25 0457 01/17/25 0458  WBC 21.0* 9.2 8.6 8.1    Liver Function Tests: No results for input(s): AST, ALT, ALKPHOS, BILITOT, PROT, ALBUMIN  in the last 168 hours.      Critical care time:  na      Tamela Stakes, MD  Attending Physician, Critical Care Medicine [facility] See Amion for pager For contact information, see Amion. If no response to pager, please call PCCM 2H APP. After hours, 7PM- 7AM, please call on call APP for 2H.    "

## 2025-01-18 ENCOUNTER — Other Ambulatory Visit: Payer: Self-pay | Admitting: Cardiovascular Disease

## 2025-01-18 ENCOUNTER — Other Ambulatory Visit: Payer: Self-pay

## 2025-01-18 DIAGNOSIS — J9601 Acute respiratory failure with hypoxia: Secondary | ICD-10-CM | POA: Diagnosis not present

## 2025-01-18 DIAGNOSIS — Z87891 Personal history of nicotine dependence: Secondary | ICD-10-CM | POA: Diagnosis not present

## 2025-01-18 DIAGNOSIS — R739 Hyperglycemia, unspecified: Secondary | ICD-10-CM | POA: Diagnosis not present

## 2025-01-18 DIAGNOSIS — I639 Cerebral infarction, unspecified: Secondary | ICD-10-CM | POA: Diagnosis not present

## 2025-01-18 DIAGNOSIS — N400 Enlarged prostate without lower urinary tract symptoms: Secondary | ICD-10-CM | POA: Diagnosis not present

## 2025-01-18 DIAGNOSIS — J90 Pleural effusion, not elsewhere classified: Secondary | ICD-10-CM | POA: Diagnosis not present

## 2025-01-18 DIAGNOSIS — R569 Unspecified convulsions: Secondary | ICD-10-CM | POA: Diagnosis not present

## 2025-01-18 DIAGNOSIS — J449 Chronic obstructive pulmonary disease, unspecified: Secondary | ICD-10-CM | POA: Diagnosis not present

## 2025-01-18 DIAGNOSIS — I4891 Unspecified atrial fibrillation: Secondary | ICD-10-CM | POA: Diagnosis not present

## 2025-01-18 DIAGNOSIS — I350 Nonrheumatic aortic (valve) stenosis: Secondary | ICD-10-CM | POA: Diagnosis not present

## 2025-01-18 DIAGNOSIS — Z952 Presence of prosthetic heart valve: Secondary | ICD-10-CM | POA: Diagnosis not present

## 2025-01-18 LAB — CBC
HCT: 21.3 % — ABNORMAL LOW (ref 39.0–52.0)
Hemoglobin: 7.3 g/dL — ABNORMAL LOW (ref 13.0–17.0)
MCH: 31.6 pg (ref 26.0–34.0)
MCHC: 34.3 g/dL (ref 30.0–36.0)
MCV: 92.2 fL (ref 80.0–100.0)
Platelets: 122 10*3/uL — ABNORMAL LOW (ref 150–400)
RBC: 2.31 MIL/uL — ABNORMAL LOW (ref 4.22–5.81)
RDW: 14.6 % (ref 11.5–15.5)
WBC: 7.5 10*3/uL (ref 4.0–10.5)
nRBC: 0.7 % — ABNORMAL HIGH (ref 0.0–0.2)

## 2025-01-18 LAB — GLUCOSE, CAPILLARY
Glucose-Capillary: 101 mg/dL — ABNORMAL HIGH (ref 70–99)
Glucose-Capillary: 65 mg/dL — ABNORMAL LOW (ref 70–99)

## 2025-01-18 LAB — BASIC METABOLIC PANEL WITH GFR
Anion gap: 10 (ref 5–15)
BUN: 23 mg/dL (ref 8–23)
CO2: 24 mmol/L (ref 22–32)
Calcium: 8.2 mg/dL — ABNORMAL LOW (ref 8.9–10.3)
Chloride: 104 mmol/L (ref 98–111)
Creatinine, Ser: 1.07 mg/dL (ref 0.61–1.24)
GFR, Estimated: >60 mL/min
Glucose, Bld: 102 mg/dL — ABNORMAL HIGH (ref 70–99)
Potassium: 3.9 mmol/L (ref 3.5–5.1)
Sodium: 138 mmol/L (ref 135–145)

## 2025-01-18 NOTE — Progress Notes (Signed)
 "  NAME:  Anthony Paul, MRN:  [id number], DOB:  1956-07-12, LOS: 5 ADMISSION DATE:  01/13/2025, CONSULTATION DATE:  1/26 REFERRING MD:  Shyrl, CHIEF COMPLAINT:  s/p AVR    History of Present Illness:  69 year old male with past medical history of prediabetes, BPH, hyperlipidemia, tobacco use and COPD, bicuspid aortic valve with severe AS who presents for AVR.   Followed by Dr. Delford with cardiology for murmur since 2014. Found to have bicuspid AV. Had serial echos showing worsening disease. He had RHC 09/2024 showing patent coronaries, normal RHC, calcified bicuspid aortic valve with restricted mobility. Recommending CVTS evaluation. 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 was seen by Dr. Shyrl TCTS 12/27/24 for AVR, now s/p replacement.  Pump time: 102 Xclamp time: 71 EBL: ~ 460 UOP:  950 cc  Products: no blood products   Cell saver: 230 cc   Pertinent  Medical History  prediabetes, BPH, hyperlipidemia, tobacco use and COPD, bicuspid aortic valve with severe AS  Significant Hospital Events: Including procedures, antibiotic start and stop dates in addition to other pertinent events   1/26: s/p AVR , extubated post-op. Had some L sided weakness> head CT ok, Neuro consulted. 1/27 early morning had sudden tonic clonic seizures, sudden shock, required intubation. cEEG. Swan & pacing wires out for MRI brain. 1/28 extubated to [place], weaned off NE 1/30 Patient has been in and out of Afibb. Likely will be transferred out of ICU.  Interim History / Subjective:  Patient is doing well.  No major issues overnight.  In A-fib with heart rate in between 110-120.  Hemodynamically stable.  On room air.  Objective   Blood pressure 118/72, pulse 95, temperature 98.7 F (37.1 C), temperature source Oral, resp. rate 20, height 6' 1 (1.854 m), weight 107.1 kg, SpO2 100%.    FiO2 (%):  [21 %] 21 %   Intake/Output Summary (Last 24 hours) at 01/18/2025  0957 Last data filed at 01/18/2025 0242 Gross per 24 hour  Intake 600 ml  Output 3450 ml  Net -2850 ml   Filed Weights   01/16/25 0630 01/17/25 0600 01/18/25 0555  Weight: 108.1 kg 108.1 kg 107.1 kg    Examination: Physical exam: General: Alert and oriented no distress. HEENT: [place]/AT, eyes anicteric.  moist mucus membranes Neuro: Alert, awake following commands Chest: Coarse breath sounds, no wheezes or rhonchi Heart: Tachycardic and irregular rhythm.  No murmurs or gallops Abdomen: Soft, nontender, nondistended, bowel sounds present bilateral pedal edema.   Resolved Hospital Problem list    Assessment & Plan:  Severe aortic stenosis s/p AVR ; EF 55-60% Bicuspid AV  A-fib with RVR Frequent PVCS , NSVT Post bypass vasoplegia -post-op management per TCTS Tubes and lines are out. -Oral amiodarone . -metoprolol  -Eliquis  started 1/30. -aspirin , statin -tele monitoring -pain control per protocol -mobilize and advance diet as able  New onset seizure, left side weakness; MRI with small ischemic stroke -keppra  -discussed seizure precautions; no driving x 6 months -Eliquis  -aspirin , statin -seizure precautions, midazolam  as needed -PT, OT -avoid all meds that lower seizure threshold-- con't holding tramadol  -CTA H&N today per stroke team  New onset Afib with RVR - On amiodarone  and metoprolol .  Can uptitrate metoprolol  if the rates are not better controlled. -Eliquis   -additional empiric MG+  -will need AC  Acute respiratory failure with hypoxia due to hypoventilation with seizure -resolved COPD  Remote tobacco use  Right pleural effusion -bronchodilators -lasix  40mg  -wean O2 - Repeat chest  x-ray from today is pending.  Hyperglycemia, h/o prediabetes  -SSI PRN -goal BG 140-180  HLD  -con't statin  BPH  -tamsulosin   Anemia post op, not unexpected or clinically significant  -1 unit pRBC on 01/17/2025 - Hemoglobin stable at 7.3.  Plans to transfer out of  ICU noted.   Labs   CBC: Recent Labs  Lab 01/15/25 0542 01/15/25 0851 01/16/25 0358 01/16/25 0457 01/16/25 1138 01/17/25 0458 01/18/25 0234  WBC 21.0*  --  9.2 8.6  --  8.1 7.5  HGB 7.7*   < > 6.4* 6.3* 7.9* 7.3* 7.3*  HCT 21.3*   < > 19.1* 18.7* 22.8* 21.0* 21.3*  MCV 94.2  --  97.0 96.9  --  91.7 92.2  PLT 88*  --  71* 72*  --  103* 122*   < > = values in this interval not displayed.    Basic Metabolic Panel: Recent Labs  Lab 01/14/25 0503 01/14/25 0553 01/14/25 1558 01/15/25 0542 01/15/25 0851 01/16/25 0358 01/17/25 0458 01/18/25 0234  NA 141   < > 141 142 141 138 139 138  K 3.6   < > 3.7 3.9 3.6 3.9 3.6 3.9  CL 108  --  106 106  --  103 103 104  CO2 21*  --  26 26  --  28 29 24   GLUCOSE 138*  --  146* 125*  --  118* 126* 102*  BUN 18  --  21 20  --  23 22 23   CREATININE 1.14  --  1.37* 1.36*  --  1.13 1.06 1.07  CALCIUM  8.3*  --  8.2* 7.9*  --  8.0* 8.4* 8.2*  MG 2.3  --  3.1* 2.5*  --  2.3 2.2  --   PHOS  --   --   --  2.5  --   --   --   --    < > = values in this interval not displayed.   GFR: Estimated Creatinine Clearance: 83.7 mL/min (by C-G formula based on SCr of 1.07 mg/dL). Recent Labs  Lab 01/16/25 0358 01/16/25 0457 01/17/25 0458 01/18/25 0234  WBC 9.2 8.6 8.1 7.5    Liver Function Tests: No results for input(s): AST, ALT, ALKPHOS, BILITOT, PROT, ALBUMIN  in the last 168 hours.      Critical care time:  na      Tamela Stakes, MD  Attending Physician, Critical Care Medicine  Pulmonary Critical Care See Amion for pager For contact information, see Amion. If no response to pager, please call PCCM 2H APP. After hours, 7PM- 7AM, please call on call APP for 2H.    "

## 2025-01-18 NOTE — Progress Notes (Signed)
 Patient arrived at the unit from 2H,upon arrival pt is alert and oriented x4,afib and RN notified pt has been in and out of a fib ,vitals taken,CCMD notified,CHG bath given,pt oriented to the unit,call bell in reach

## 2025-01-18 NOTE — Progress Notes (Signed)
 Physical Therapy Treatment Patient Details Name: Anthony Paul MRN: [id number] DOB: 1956/05/24 Today's Date: 01/18/2025   History of Present Illness Pt is 69 yo presenting to [facility] on 1/26 for scheduled surgery. Pt currently s/p AVR on 1/26; extubated 1/27 then had sudden tonic clonic seizures and required re-intubation. Pt extubated 1/29. Possible small CVA PMH: ascending aortia dilatation, bicuspid aortic valve, BPH, chronic abdominal pain, BPH, COPD, DJD, insomnia, lumbar spondylosis, mixed hyperlipidemia, prediabetes, Vit B12    PT Comments  Pt making excellent improvement with mobility. Fatigues with activity but improved activity tolerance compared to yesterday.     If plan is discharge home, recommend the following: A little help with walking and/or transfers;Assistance with cooking/housework;Assist for transportation;Help with stairs or ramp for entrance   Can travel by private vehicle        Equipment Recommendations  Rolling walker (2 wheels);BSC/3in1    Recommendations for Other Services       Precautions / Restrictions Precautions Precautions: Fall;Sternal Precaution Booklet Issued: Yes (comment) Recall of Precautions/Restrictions: Impaired Restrictions Other Position/Activity Restrictions: sternal precautions     Mobility  Bed Mobility Overal bed mobility: Needs Assistance Bed Mobility: Rolling, Sidelying to Sit Rolling: Supervision Sidelying to sit: Supervision, HOB elevated       General bed mobility comments: Good technique    Transfers Overall transfer level: Needs assistance Equipment used: Rolling walker (2 wheels), None Transfers: Sit to/from Stand Sit to Stand: Supervision           General transfer comment: Instructed to place UE's on knees    Ambulation/Gait Ambulation/Gait assistance: Contact guard assist Gait Distance (Feet): 200 Feet Assistive device: None Gait Pattern/deviations: Step-through pattern, Decreased stride length Gait  velocity: slowed Gait velocity interpretation: 1.31 - 2.62 ft/sec, indicative of limited community ambulator   General Gait Details: Amb to bathroom door with walker but was carrying it so amb in hallway without device. Took 2 brief standing rest breaks. Assist for safety and no loss of balance.   Stairs             Wheelchair Mobility     Tilt Bed    Modified Rankin (Stroke Patients Only)       Balance Overall balance assessment: Needs assistance Sitting-balance support: No upper extremity supported, Feet supported Sitting balance-Leahy Scale: Good     Standing balance support: No upper extremity supported, Bilateral upper extremity supported, During functional activity Standing balance-Leahy Scale: Fair                              Hotel Manager: Impaired Factors Affecting Communication: Hearing impaired  Cognition Arousal: Alert Behavior During Therapy: WFL for tasks assessed/performed   PT - Cognitive impairments: Safety/Judgement, Memory                         Following commands: Intact      Cueing Cueing Techniques: Verbal cues  Exercises      General Comments General comments (skin integrity, edema, etc.): VSS on RA with SpO2 93-97% after amb      Pertinent Vitals/Pain Pain Assessment Pain Assessment: Faces Faces Pain Scale: Hurts a little bit Pain Location: sternum Pain Descriptors / Indicators: Discomfort Pain Intervention(s): Limited activity within patient's tolerance    Home Living  Prior Function            PT Goals (current goals can now be found in the care plan section) Acute Rehab PT Goals Patient Stated Goal: improve mobility Progress towards PT goals: Progressing toward goals    Frequency    Min 2X/week      PT Plan      Co-evaluation              AM-PAC PT 6 Clicks Mobility   Outcome Measure  Help needed turning from  your back to your side while in a flat bed without using bedrails?: A Little Help needed moving from lying on your back to sitting on the side of a flat bed without using bedrails?: A Little Help needed moving to and from a bed to a chair (including a wheelchair)?: A Little Help needed standing up from a chair using your arms (e.g., wheelchair or bedside chair)?: A Little Help needed to walk in hospital room?: A Little Help needed climbing 3-5 steps with a railing? : Total 6 Click Score: 16    End of Session   Activity Tolerance: Patient tolerated treatment well Patient left: with call bell/phone within reach;in chair;with family/visitor present Nurse Communication: Mobility status PT Visit Diagnosis: Unsteadiness on feet (R26.81);Muscle weakness (generalized) (M62.81);Other abnormalities of gait and mobility (R26.89);Pain Pain - part of body:  (sternum)     Time: 8480-8464 PT Time Calculation (min) (ACUTE ONLY): 16 min  Charges:    $Gait Training: 8-22 mins PT General Charges $$ ACUTE PT VISIT: 1 Visit                     [facility] PT Acute Rehabilitation Services Office [phone]    Rodgers ORN [facility] 01/18/2025, 3:46 PM

## 2025-01-18 NOTE — Progress Notes (Signed)
 5 Days Post-Op Procedures (LRB): REPLACEMENT, AORTIC VALVE, OPEN UTILIZING 25 MM  INSPIRIS RESILIA  AORTIC VALVE (N/A) ECHOCARDIOGRAM, TRANSESOPHAGEAL, INTRAOPERATIVE (N/A) Subjective: Up in chair, pain well controlled Daughter concerned SOB with ambulation  Objective: Vital signs in last 24 hours: Temp:  [98.1 F (36.7 C)-98.9 F (37.2 C)] 98.7 F (37.1 C) (01/31 0615) Pulse Rate:  [67-124] 95 (01/30 1313) Cardiac Rhythm: Atrial fibrillation (01/30 2300) Resp:  [16-20] 20 (01/30 1800) BP: (96-145)/(55-90) 118/72 (01/31 0615) SpO2:  [92 %-100 %] 95 % (01/31 0615) FiO2 (%):  [21 %] 21 % (01/30 0910) Weight:  [107.1 kg] 107.1 kg (01/31 0555)  Hemodynamic parameters for last 24 hours:    Intake/Output from previous day: 01/30 0701 - 01/31 0700 In: 840 [P.O.:840] Out: 3850 [Urine:3850] Intake/Output this shift: No intake/output data recorded.  General appearance: alert, cooperative, and no distress Neurologic: intact Heart: irregularly irregular rhythm Lungs: diminished breath sounds bibasilar Abdomen: normal findings: soft, non-tender Extremities: edema 2+  Lab Results: Recent Labs    01/17/25 0458 01/18/25 0234  WBC 8.1 7.5  HGB 7.3* 7.3*  HCT 21.0* 21.3*  PLT 103* 122*   BMET:  Recent Labs    01/17/25 0458 01/18/25 0234  NA 139 138  K 3.6 3.9  CL 103 104  CO2 29 24  GLUCOSE 126* 102*  BUN 22 23  CREATININE 1.06 1.07  CALCIUM  8.4* 8.2*    PT/INR: No results for input(s): LABPROT, INR in the last 72 hours. ABG    Component Value Date/Time   PHART 7.424 01/14/2025 1205   HCO3 26.5 01/15/2025 0851   TCO2 28 01/15/2025 0851   ACIDBASEDEF 1.0 01/14/2025 0809   O2SAT 59 01/15/2025 0851   CBG (last 3)  Recent Labs    01/17/25 1505 01/17/25 2053 01/18/25 0555  GLUCAP 103* 93 101*    Assessment/Plan: S/P Procedures (LRB): REPLACEMENT, AORTIC VALVE, OPEN UTILIZING 25 MM  INSPIRIS RESILIA  AORTIC VALVE (N/A) ECHOCARDIOGRAM, TRANSESOPHAGEAL,  INTRAOPERATIVE (N/A) POD # 5 NEURO- left temporoccipital CVA  Good strength CV_ In atrial fib with rate in 120s  On PO amiodarone , Lopressor   On Eliquis  RESP_ sat Ok on RA  CXR yesterday with right effusion   Check PA/lateral in AM RENAL- creatinine and lytes Ok  Still with some edema, effusions  Continue diuresis ENDO- CBG well controlled  Dc SSI GI- tolerating diet Hgb 7.2 stable Cardiac rehab   LOS: 5 days    Elspeth JAYSON Millers 01/18/2025

## 2025-01-18 NOTE — Plan of Care (Signed)
  Problem: Education: Goal: Knowledge of General Education information will improve Description: Including pain rating scale, medication(s)/side effects and non-pharmacologic comfort measures Outcome: Progressing   Problem: Health Behavior/Discharge Planning: Goal: Ability to manage health-related needs will improve Outcome: Progressing   Problem: Clinical Measurements: Goal: Ability to maintain clinical measurements within normal limits will improve Outcome: Progressing Goal: Will remain free from infection Outcome: Progressing Goal: Diagnostic test results will improve Outcome: Progressing Goal: Respiratory complications will improve Outcome: Progressing Goal: Cardiovascular complication will be avoided Outcome: Progressing   Problem: Activity: Goal: Risk for activity intolerance will decrease Outcome: Progressing   Problem: Elimination: Goal: Will not experience complications related to bowel motility Outcome: Progressing Goal: Will not experience complications related to urinary retention Outcome: Progressing   Problem: Pain Managment: Goal: General experience of comfort will improve and/or be controlled Outcome: Progressing

## 2025-01-19 ENCOUNTER — Inpatient Hospital Stay (HOSPITAL_COMMUNITY)

## 2025-01-19 LAB — CBC
HCT: 22.6 % — ABNORMAL LOW (ref 39.0–52.0)
Hemoglobin: 7.7 g/dL — ABNORMAL LOW (ref 13.0–17.0)
MCH: 32.2 pg (ref 26.0–34.0)
MCHC: 34.1 g/dL (ref 30.0–36.0)
MCV: 94.6 fL (ref 80.0–100.0)
Platelets: 150 10*3/uL (ref 150–400)
RBC: 2.39 MIL/uL — ABNORMAL LOW (ref 4.22–5.81)
RDW: 14.6 % (ref 11.5–15.5)
WBC: 9.7 10*3/uL (ref 4.0–10.5)
nRBC: 0.6 % — ABNORMAL HIGH (ref 0.0–0.2)

## 2025-01-19 LAB — BASIC METABOLIC PANEL WITH GFR
Anion gap: 11 (ref 5–15)
BUN: 24 mg/dL — ABNORMAL HIGH (ref 8–23)
CO2: 23 mmol/L (ref 22–32)
Calcium: 8.3 mg/dL — ABNORMAL LOW (ref 8.9–10.3)
Chloride: 106 mmol/L (ref 98–111)
Creatinine, Ser: 1.24 mg/dL (ref 0.61–1.24)
GFR, Estimated: >60 mL/min
Glucose, Bld: 104 mg/dL — ABNORMAL HIGH (ref 70–99)
Potassium: 4 mmol/L (ref 3.5–5.1)
Sodium: 140 mmol/L (ref 135–145)

## 2025-01-19 MED ORDER — AMIODARONE IV BOLUS ONLY 150 MG/100ML
150.0000 mg | Freq: Once | INTRAVENOUS | Status: AC
  Administered 2025-01-19: 150 mg via INTRAVENOUS
  Filled 2025-01-19: qty 100

## 2025-01-19 MED ORDER — FE FUM-VIT C-VIT B12-FA 460-60-0.01-1 MG PO CAPS
1.0000 | ORAL_CAPSULE | Freq: Every day | ORAL | Status: DC
  Administered 2025-01-19 – 2025-01-23 (×5): 1 via ORAL
  Filled 2025-01-19 (×5): qty 1

## 2025-01-19 MED ORDER — METOPROLOL TARTRATE 50 MG PO TABS
50.0000 mg | ORAL_TABLET | Freq: Two times a day (BID) | ORAL | Status: DC
  Administered 2025-01-19 – 2025-01-22 (×8): 50 mg via ORAL
  Filled 2025-01-19 (×8): qty 1

## 2025-01-19 MED ORDER — IOHEXOL 350 MG/ML SOLN
75.0000 mL | Freq: Once | INTRAVENOUS | Status: AC | PRN
  Administered 2025-01-19: 75 mL via INTRAVENOUS

## 2025-01-19 NOTE — Progress Notes (Signed)
 Pt ambulated in hallway approximately 480 feet with front wheeled walker and 2LNC for comfort without difficulty.  Patient tolerated ambulation well.

## 2025-01-19 NOTE — Progress Notes (Addendum)
 "     [address] [place] [employer] [address]             [phone]     6 Days Post-Op Procedures (LRB): REPLACEMENT, AORTIC VALVE, OPEN UTILIZING 25 MM  INSPIRIS RESILIA  AORTIC VALVE (N/A) ECHOCARDIOGRAM, TRANSESOPHAGEAL, INTRAOPERATIVE (N/A) Subjective: Feels SOB, sats are good , in afib w/ RVR  Objective: Vital signs in last 24 hours: Temp:  [97.1 F (36.2 C)-100 F (37.8 C)] 100 F (37.8 C) (02/01 0511) Pulse Rate:  [76-112] 81 (01/31 2042) Cardiac Rhythm: Atrial fibrillation (01/31 1900) Resp:  [17-24] 21 (02/01 0511) BP: (96-135)/(62-99) 116/99 (02/01 0511) SpO2:  [93 %-100 %] 93 % (02/01 0511) FiO2 (%):  [21 %] 21 % (01/31 0955) Weight:  [106.6 kg] 106.6 kg (02/01 0258)  Hemodynamic parameters for last 24 hours:    Intake/Output from previous day: No intake/output data recorded. Intake/Output this shift: No intake/output data recorded.  General appearance: alert, cooperative, and no distress Heart: irregularly irregular rhythm and tachy Lungs: mildly dim in left base Abdomen: benign Extremities: + LE edema Wound: incis healing well Neuro- strength and balance issues improving per mobility and PT Lab Results: Recent Labs    01/18/25 0234 01/19/25 0202  WBC 7.5 9.7  HGB 7.3* 7.7*  HCT 21.3* 22.6*  PLT 122* 150   BMET:  Recent Labs    01/18/25 0234 01/19/25 0202  NA 138 140  K 3.9 4.0  CL 104 106  CO2 24 23  GLUCOSE 102* 104*  BUN 23 24*  CREATININE 1.07 1.24  CALCIUM  8.2* 8.3*    PT/INR: No results for input(s): LABPROT, INR in the last 72 hours. ABG    Component Value Date/Time   PHART 7.424 01/14/2025 1205   HCO3 26.5 01/15/2025 0851   TCO2 28 01/15/2025 0851   ACIDBASEDEF 1.0 01/14/2025 0809   O2SAT 59 01/15/2025 0851   CBG (last 3)  Recent Labs    01/17/25 2053 01/18/25 0555 01/18/25 1125  GLUCAP 93 101* 65*    Meds Scheduled Meds:  amiodarone   400 mg Oral BID   apixaban   5 mg Oral BID    arformoterol   15 mcg Nebulization BID   aspirin  EC  81 mg Oral Daily   atorvastatin   40 mg Oral QPM   bisacodyl   10 mg Oral Daily   Or   bisacodyl   10 mg Rectal Daily   chlorhexidine   30 mL Topical UD   Chlorhexidine  Gluconate Cloth  6 each Topical Daily   docusate sodium   200 mg Oral Daily   feeding supplement  237 mL Oral BID BM   fentaNYL  (SUBLIMAZE ) injection  25 mcg Intravenous Once   furosemide   40 mg Oral Daily   icosapent  Ethyl  2 g Oral BID   levETIRAcetam   500 mg Oral BID   metoprolol  tartrate  25 mg Oral BID   naloxegol  oxalate  25 mg Oral Daily   mouth rinse  15 mL Mouth Rinse QID   oxyCODONE   10 mg Oral Q8H   pantoprazole   40 mg Oral QHS   potassium chloride   40 mEq Oral Daily   revefenacin   175 mcg Nebulization Daily   sodium chloride  flush  3 mL Intravenous Q12H   sodium chloride  flush  3 mL Intravenous Q12H   tamsulosin   0.4 mg Oral Daily   Continuous Infusions: PRN Meds:.albuterol , fentaNYL  (SUBLIMAZE ) injection, metoprolol  tartrate, ondansetron  (ZOFRAN ) IV, mouth rinse, mouth rinse, oxyCODONE , sodium  chloride flush, sodium chloride  flush  Xrays DG Chest 2 View Result Date: 01/19/2025 EXAM: 2 VIEW(S) XRAY OF THE CHEST 01/19/2025 07:24:00 AM COMPARISON: 01/16/2025 CLINICAL HISTORY: Pleural effusion. FINDINGS: LUNGS AND PLEURA: Decreased small right pleural effusion. Increased small to moderate left pleural effusion with adjacent left lower lobe airspace opacity. No pneumothorax. HEART AND MEDIASTINUM: Stable cardiomediastinal silhouette with aortic valve replacement and median sternotomy wires. BONES AND SOFT TISSUES: No acute osseous abnormality. Median sternotomy wires are present. IMPRESSION: 1. Increased small to moderate left pleural effusion with adjacent left lower lobe opacity. 2. Decreased small right pleural effusion. Electronically signed by: Waddell Calk MD 01/19/2025 07:36 AM EST RP Workstation: [id number]    Assessment/Plan: S/P Procedures  (LRB): REPLACEMENT, AORTIC VALVE, OPEN UTILIZING 25 MM  INSPIRIS RESILIA  AORTIC VALVE (N/A) ECHOCARDIOGRAM, TRANSESOPHAGEAL, INTRAOPERATIVE (N/A) POD#6  1 Tmax 100, s BP 90's-130's, afib-w RVR at times on amio po, lopressor , eliquis - will increase lopressor  dose, will give an amio bolus 2 O2 sats good on RA 3 I/O weight about 1 kg>preop, voiding well, amts- not measured,  renal fxn- BUN 24/creat 1.24- may not need much more diuresis- will cont for now w/ small left p effusion, some edema 4 + BM 5 K+ 4.0 on replacement w/ diuretic 6 no leukocytosis 7 H/H 7.7/22.6- stable expected ABLA over past several days- will add trigel-forte 8 thrombocytopenia resolved 9 CXR- small left p effus 10 home PT being recommended, no longer CIR- cont PT/rehab modalities 11 cont pulm hygiene    LOS: 6 days    Lemond FORBES Cera PA-C Pager [phone] 01/19/2025 Patient and daughter remain extremely concerned out his dyspnea Has a small to moderate left effusion which may be contributing but wouldn't totally explain his symptoms Will check CT to r/o PE although I think it is unlikely but not completely out of the question  Elspeth C. Kerrin, MD [facility] [phone]  "

## 2025-01-20 ENCOUNTER — Inpatient Hospital Stay (HOSPITAL_COMMUNITY)

## 2025-01-20 LAB — BASIC METABOLIC PANEL WITH GFR
Anion gap: 11 (ref 5–15)
BUN: 24 mg/dL — ABNORMAL HIGH (ref 8–23)
CO2: 24 mmol/L (ref 22–32)
Calcium: 8.3 mg/dL — ABNORMAL LOW (ref 8.9–10.3)
Chloride: 100 mmol/L (ref 98–111)
Creatinine, Ser: 1.24 mg/dL (ref 0.61–1.24)
GFR, Estimated: >60 mL/min
Glucose, Bld: 98 mg/dL (ref 70–99)
Potassium: 4.4 mmol/L (ref 3.5–5.1)
Sodium: 134 mmol/L — ABNORMAL LOW (ref 135–145)

## 2025-01-20 MED ORDER — OXYCODONE HCL 5 MG PO TABS
10.0000 mg | ORAL_TABLET | ORAL | Status: DC | PRN
  Administered 2025-01-20 – 2025-01-23 (×10): 10 mg via ORAL
  Filled 2025-01-20 (×10): qty 2

## 2025-01-20 MED ORDER — LIDOCAINE-EPINEPHRINE 1 %-1:100000 IJ SOLN
INTRAMUSCULAR | Status: AC
  Filled 2025-01-20: qty 20

## 2025-01-20 MED ORDER — POTASSIUM CHLORIDE CRYS ER 20 MEQ PO TBCR
20.0000 meq | EXTENDED_RELEASE_TABLET | Freq: Every day | ORAL | Status: DC
  Administered 2025-01-21: 20 meq via ORAL
  Filled 2025-01-20: qty 1

## 2025-01-20 MED ORDER — ACETAMINOPHEN 500 MG PO TABS
1000.0000 mg | ORAL_TABLET | Freq: Four times a day (QID) | ORAL | Status: DC
  Administered 2025-01-20 – 2025-01-23 (×10): 1000 mg via ORAL
  Filled 2025-01-20 (×13): qty 2

## 2025-01-20 MED ORDER — LACTULOSE 10 GM/15ML PO SOLN: 20.0000 g | Freq: Two times a day (BID) | ORAL | Status: DC | PRN

## 2025-01-20 MED ORDER — LIDOCAINE-EPINEPHRINE 1 %-1:100000 IJ SOLN
20.0000 mL | Freq: Once | INTRAMUSCULAR | Status: AC
  Administered 2025-01-20: 10 mL via INTRADERMAL

## 2025-01-20 NOTE — Plan of Care (Signed)

## 2025-01-20 NOTE — Progress Notes (Signed)
 CARDIAC REHAB PHASE I    Post OHS education including site care, restrictions, heart healthy diabetic diet, sternal precautions, IS use at home, home needs at discharge, exercise guidelines, smoking cessation and CRP2 reviewed. All questions and concerns addressed. Family and patient very receptive. Will refer to [facility] for CRP2.   [id number] Anthony JAYSON [place], RN BSN 01/20/2025 10:29 AM

## 2025-01-20 NOTE — Progress Notes (Signed)
 Occupational Therapy Treatment Patient Details Name: Anthony Paul MRN: [id number] DOB: [birth year] Today's Date: 01/20/2025   History of present illness Pt is 69 yo presenting to [facility] on 1/26 for scheduled surgery. Pt currently s/p AVR on 1/26; extubated 1/27 then had sudden tonic clonic seizures and required re-intubation. Pt extubated 1/29. Possible small CVA PMH: ascending aortia dilatation, bicuspid aortic valve, BPH, chronic abdominal pain, BPH, COPD, DJD, insomnia, lumbar spondylosis, mixed hyperlipidemia, prediabetes, Vit B12   OT comments  Patient demonstrating good gains with OT treatment. Patient up in recliner upon entry and able to perform ambulation to bathroom and standing at sink for grooming with supervision and no AD.  Patient able to ambulate in hallway with RW and supervision. SpO2 94-100% on 3 liters.  Discharge recommendations continue to be appropriate.       If plan is discharge home, recommend the following:  A little help with bathing/dressing/bathroom;Assistance with cooking/housework;Assist for transportation;Help with stairs or ramp for entrance   Equipment Recommendations  Other (comment) (RW; TBD)    Recommendations for Other Services      Precautions / Restrictions Precautions Precautions: Fall;Sternal Precaution Booklet Issued: Yes (comment) Recall of Precautions/Restrictions: Impaired Restrictions Weight Bearing Restrictions Per Provider Order: No Other Position/Activity Restrictions: sternal precautions       Mobility Bed Mobility Overal bed mobility: Needs Assistance             General bed mobility comments: OOB in recliner at beginning and end of session    Transfers Overall transfer level: Needs assistance Equipment used: Rolling walker (2 wheels), None Transfers: Sit to/from Stand Sit to Stand: Supervision           General transfer comment: performed mobility and transfers in room without AD and RW in hallway     Balance  Overall balance assessment: Needs assistance Sitting-balance support: No upper extremity supported, Feet supported Sitting balance-Leahy Scale: Good     Standing balance support: No upper extremity supported, Bilateral upper extremity supported, During functional activity Standing balance-Leahy Scale: Fair Standing balance comment: UE for distance                           ADL either performed or assessed with clinical judgement   ADL Overall ADL's : Needs assistance/impaired     Grooming: Wash/dry hands;Wash/dry face;Oral care;Supervision/safety;Standing Grooming Details (indicate cue type and reason): at sink                 Toilet Transfer: Supervision/safety;Ambulation;Regular Teacher, Adult Education Details (indicate cue type and reason): no AD Toileting- Clothing Manipulation and Hygiene: Modified independent;Sitting/lateral lean       Functional mobility during ADLs: Supervision/safety;Rolling walker (2 wheels) General ADL Comments: able to perform mobility in room without AD and RW in hallway    Extremity/Trunk Assessment              Vision       Perception     Praxis     Communication Communication Communication: Impaired Factors Affecting Communication: Hearing impaired   Cognition Arousal: Alert Behavior During Therapy: WFL for tasks assessed/performed Cognition: No apparent impairments             OT - Cognition Comments: Pleasant but states eager to discharge home                 Following commands: Intact        Cueing   Cueing Techniques: Verbal cues  Exercises      Shoulder Instructions       General Comments SpO2 94-100% on 3 liters    Pertinent Vitals/ Pain       Pain Assessment Pain Assessment: No/denies pain  Home Living                                          Prior Functioning/Environment              Frequency  Min 2X/week        Progress Toward Goals  OT  Goals(current goals can now be found in the care plan section)  Progress towards OT goals: Progressing toward goals  Acute Rehab OT Goals Patient Stated Goal: to go home OT Goal Formulation: With patient Time For Goal Achievement: 01/30/25 Potential to Achieve Goals: Good ADL Goals Pt Will Perform Lower Body Bathing: with modified independence;sit to/from stand;sitting/lateral leans;with adaptive equipment Pt Will Perform Lower Body Dressing: with modified independence;sit to/from stand;sitting/lateral leans;with adaptive equipment Pt Will Transfer to Toilet: with modified independence;ambulating Additional ADL Goal #1: Pt to implement sternal precautions during ADLs/mobility without verbal cues needed  Plan      Co-evaluation                 AM-PAC OT 6 Clicks Daily Activity     Outcome Measure   Help from another person eating meals?: None Help from another person taking care of personal grooming?: A Little Help from another person toileting, which includes using toliet, bedpan, or urinal?: A Little Help from another person bathing (including washing, rinsing, drying)?: A Little Help from another person to put on and taking off regular upper body clothing?: A Little Help from another person to put on and taking off regular lower body clothing?: A Little 6 Click Score: 19    End of Session Equipment Utilized During Treatment: Gait belt;Rolling walker (2 wheels);Oxygen  OT Visit Diagnosis: Unsteadiness on feet (R26.81);Muscle weakness (generalized) (M62.81)   Activity Tolerance Patient tolerated treatment well   Patient Left in chair;with call bell/phone within reach;with family/visitor present   Nurse Communication Mobility status        Time: 0727-0800 OT Time Calculation (min): 33 min  Charges: OT General Charges $OT Visit: 1 Visit OT Treatments $Self Care/Home Management : 8-22 mins $Therapeutic Activity: 8-22 mins  Dick Laine, OTA Acute Rehabilitation  Services  Office [phone]   [name] Laine 01/20/2025, 8:54 AM

## 2025-01-20 NOTE — Procedures (Signed)
 PROCEDURE SUMMARY:  Successful image-guided therapeutic thoracentesis from the left chest.  Yielded 1.1 liters of dark maroon-colored fluid.  No immediate complications.  EBL: zero Patient tolerated well.   Specimen not sent for labs.  Post-procedure CXR ordered and obtained prior to departure from department.   Please see imaging section of Epic for full dictation.  Audry Kauzlarich B Kaari Zeigler NP 01/20/2025 1:04 PM

## 2025-01-21 ENCOUNTER — Inpatient Hospital Stay (HOSPITAL_COMMUNITY)

## 2025-01-21 LAB — CBC
HCT: 22.9 % — ABNORMAL LOW (ref 39.0–52.0)
Hemoglobin: 7.6 g/dL — ABNORMAL LOW (ref 13.0–17.0)
MCH: 31.7 pg (ref 26.0–34.0)
MCHC: 33.2 g/dL (ref 30.0–36.0)
MCV: 95.4 fL (ref 80.0–100.0)
Platelets: 184 10*3/uL (ref 150–400)
RBC: 2.4 MIL/uL — ABNORMAL LOW (ref 4.22–5.81)
RDW: 15.9 % — ABNORMAL HIGH (ref 11.5–15.5)
WBC: 9.5 10*3/uL (ref 4.0–10.5)
nRBC: 0.3 % — ABNORMAL HIGH (ref 0.0–0.2)

## 2025-01-21 LAB — BASIC METABOLIC PANEL WITH GFR
Anion gap: 9 (ref 5–15)
BUN: 23 mg/dL (ref 8–23)
CO2: 25 mmol/L (ref 22–32)
Calcium: 8.5 mg/dL — ABNORMAL LOW (ref 8.9–10.3)
Chloride: 104 mmol/L (ref 98–111)
Creatinine, Ser: 1.17 mg/dL (ref 0.61–1.24)
GFR, Estimated: >60 mL/min
Glucose, Bld: 107 mg/dL — ABNORMAL HIGH (ref 70–99)
Potassium: 3.9 mmol/L (ref 3.5–5.1)
Sodium: 137 mmol/L (ref 135–145)

## 2025-01-21 MED ORDER — MAGNESIUM OXIDE -MG SUPPLEMENT 400 (240 MG) MG PO TABS
400.0000 mg | ORAL_TABLET | Freq: Two times a day (BID) | ORAL | Status: AC
  Administered 2025-01-21 – 2025-01-22 (×4): 400 mg via ORAL
  Filled 2025-01-21 (×4): qty 1

## 2025-01-21 MED ORDER — LIDOCAINE-EPINEPHRINE 1 %-1:100000 IJ SOLN
20.0000 mL | Freq: Once | INTRAMUSCULAR | Status: AC
  Administered 2025-01-21: 10 mL via INTRADERMAL

## 2025-01-21 MED ORDER — COLCHICINE 0.6 MG PO TABS
0.6000 mg | ORAL_TABLET | Freq: Every day | ORAL | Status: DC
  Administered 2025-01-21 – 2025-01-23 (×3): 0.6 mg via ORAL
  Filled 2025-01-21 (×3): qty 1

## 2025-01-21 MED ORDER — AMIODARONE HCL 200 MG PO TABS
200.0000 mg | ORAL_TABLET | Freq: Two times a day (BID) | ORAL | Status: DC
  Administered 2025-01-21 – 2025-01-23 (×4): 200 mg via ORAL
  Filled 2025-01-21 (×4): qty 1

## 2025-01-21 MED ORDER — LIDOCAINE-EPINEPHRINE 1 %-1:100000 IJ SOLN
INTRAMUSCULAR | Status: AC
  Filled 2025-01-21: qty 20

## 2025-01-21 MED ORDER — POTASSIUM CHLORIDE CRYS ER 20 MEQ PO TBCR
20.0000 meq | EXTENDED_RELEASE_TABLET | Freq: Two times a day (BID) | ORAL | Status: DC
  Administered 2025-01-21: 20 meq via ORAL
  Filled 2025-01-21: qty 1

## 2025-01-21 NOTE — TOC Progression Note (Signed)
 Transition of Care (TOC) - Progression Note  Rayfield Gobble RN, BSN Inpatient Care Management Unit 4E- RN Case Manager See Treatment Team for direct phone #   Patient Details  Name: Anthony Paul MRN: [id number] Date of Birth: [birth year]  Transition of Care [facility]) CM/SW Contact  Gobble, Rayfield Hurst, RN Phone Number: 01/21/2025, 1:00 PM  Clinical Narrative:    Cm in to speak with pt at bedside, wife also present.  Discussed HH/DME needs, HH choice offered and explained TCTS office referral to [facility] for [facility] needs- pt voiced he does not have preference and is agreeable to have [facility] service for [facility]. Orders in for PT/OT.  Also reviewed DME orders for RW and [facility]- pt voiced he does not feel he will need BSC- he is walking the distance required to get to bathroom at home, he does confirm he wants/needs RW for home.  No preference in DME provider.   Family will transport home.   Referral for DME- RW sent to [facility] liaison- RW to be delivered to pt prior to discharge.   CM notified [facility] liaison for TCTS office for [facility] needs and planned discharge for tomorrow. She will follow for start of care scheduling.      Expected Discharge Plan: Home w Home Health Services Barriers to Discharge: Continued Medical Work up               Expected Discharge Plan and Services In-house Referral: NA Discharge Planning Services: CM Consult Post Acute Care Choice: Durable Medical Equipment, Home Health Living arrangements for the past 2 months: Single Family Home                 DME Arranged: Bedside commode, Walker rolling DME Agency: [facility] Date DME Agency Contacted: 01/21/25 Time DME Agency Contacted: [id number] Representative spoke with at DME Agency: Zack HH Arranged: PT, OT HH Agency: [facility] Date HH Agency Contacted: 01/19/25   Representative spoke with at [facility] Agency: [facility]   Social Drivers of Health (SDOH) Interventions SDOH Screenings   Food  Insecurity: No Food Insecurity (01/18/2025)  Housing: Low Risk (01/18/2025)  Transportation Needs: No Transportation Needs (01/18/2025)  Utilities: Not At Risk (01/18/2025)  Depression (PHQ2-9): Low Risk (01/01/2025)  Physical Activity: Inactive (01/01/2025)  Social Connections: Socially Integrated (01/18/2025)  Stress: No Stress Concern Present (01/01/2025)  Tobacco Use: High Risk (01/13/2025)  Health Literacy: Adequate Health Literacy (01/01/2025)    Readmission Risk Interventions    01/21/2025    1:00 PM  Readmission Risk Prevention Plan  Post Dischage Appt Complete  Medication Screening Complete  Transportation Screening Complete

## 2025-01-21 NOTE — Progress Notes (Signed)
 Inpatient Rehab Admissions Coordinator:    Therapies now recommend. Cir will not pursue. Pt. And wife in agreement with dc home once medically stable.  Leita Kleine, MS, CCC-SLP Rehab Admissions Coordinator  [phone] (celll) [phone] (office)

## 2025-01-21 NOTE — Care Management Important Message (Signed)
 Important Message  Patient Details  Name: Anthony Paul MRN: [id number] Date of Birth: Dec 09, 1956   Important Message Given:  Yes - Medicare IM     [name] 01/21/2025, 11:44 AM

## 2025-01-21 NOTE — Progress Notes (Signed)
 Mobility Specialist Progress Note;   01/21/25 1039  Mobility  Activity Ambulated with assistance  Level of Assistance Standby assist, set-up cues, supervision of patient - no hands on  Assistive Device Front wheel walker  Distance Ambulated (ft) 200 ft  RUE Weight Bearing Per Provider Order NWB  LUE Weight Bearing Per Provider Order NWB  Activity Response Tolerated well  Mobility Referral Yes  Mobility visit 1 Mobility  Mobility Specialist Start Time (ACUTE ONLY) 1039  Mobility Specialist Stop Time (ACUTE ONLY) 1049  Mobility Specialist Time Calculation (min) (ACUTE ONLY) 10 min   Pt in chair upon arrival, agreeable to mobility. Required no physical assistance during ambulation, SV for safety. Min VC required for sternal precautions. VSS throughout. Only c/o BLE feeling weak, otherwise asx. Pt returned back to chair and left with all needs met. Family present.   Lauraine Erm Mobility Specialist Please contact via SecureChat or [employer] [phone]

## 2025-01-21 NOTE — Procedures (Signed)
 Ultrasound-guided therapeutic right thoracentesis performed yielding 800 ml of serous colored fluid. No immediate complications.  Follow-up chest x-ray pending. EBL is < 2 ml.

## 2025-01-22 LAB — BASIC METABOLIC PANEL WITH GFR
Anion gap: 9 (ref 5–15)
BUN: 24 mg/dL — ABNORMAL HIGH (ref 8–23)
CO2: 25 mmol/L (ref 22–32)
Calcium: 8.3 mg/dL — ABNORMAL LOW (ref 8.9–10.3)
Chloride: 105 mmol/L (ref 98–111)
Creatinine, Ser: 1.43 mg/dL — ABNORMAL HIGH (ref 0.61–1.24)
GFR, Estimated: 53 mL/min — ABNORMAL LOW
Glucose, Bld: 108 mg/dL — ABNORMAL HIGH (ref 70–99)
Potassium: 4 mmol/L (ref 3.5–5.1)
Sodium: 139 mmol/L (ref 135–145)

## 2025-01-22 LAB — MAGNESIUM: Magnesium: 2.1 mg/dL (ref 1.7–2.4)

## 2025-01-22 NOTE — Progress Notes (Addendum)
" ° °   °  [address] [place] [address]             [phone]      Have not been able to place orders this AM since 7:00AM due to another staff member being in the chart. I called and told the RN to hold Lasix  this AM and get hemoccult if he has another bowel movement. I will place orders when I am able.   Addendum: As discussed with Dr. Shyrl will not order hemoccult at this time. Orders have been placed. We will plan to keep him in the hospital today and hopefully discharge tomorrow AM if creatinine remains stable or improves.   Anthony Paul Bend, PA-C  "

## 2025-01-22 NOTE — Progress Notes (Signed)
 Mobility Specialist Progress Note;   01/22/25 0907  Mobility  Activity Ambulated with assistance  Level of Assistance Standby assist, set-up cues, supervision of patient - no hands on  Assistive Device Front wheel walker  Distance Ambulated (ft) 250 ft  RUE Weight Bearing Per Provider Order NWB  LUE Weight Bearing Per Provider Order NWB  Activity Response Tolerated well  Mobility Referral Yes  Mobility visit 1 Mobility  Mobility Specialist Start Time (ACUTE ONLY) 0907  Mobility Specialist Stop Time (ACUTE ONLY) [id number]  Mobility Specialist Time Calculation (min) (ACUTE ONLY) 11 min   Pt agreeable to mobility. Required no physical assistance during ambulation, SV for safety. Min VC required for sternal precautions w/ standing. VSS throughout. C/o some SOB during ambulation. Agreeable to sit up in chair at [facility]. Pt left in chair with all needs met, call bell in reach. Daughter present.   Lauraine Erm Mobility Specialist Please contact via SecureChat or [employer] [phone]

## 2025-01-22 NOTE — Progress Notes (Addendum)
 "     [address] Zone [place] [address]             [phone]      9 Days Post-Op Procedures (LRB): REPLACEMENT, AORTIC VALVE, OPEN UTILIZING 25 MM  INSPIRIS RESILIA  AORTIC VALVE (N/A) ECHOCARDIOGRAM, TRANSESOPHAGEAL, INTRAOPERATIVE (N/A) Subjective: Patient reports his breathing is about the same as yesterday, he still has some pain and discomfort with deep breaths and has some shortness of breath with ambulation.   Objective: Vital signs in last 24 hours: Temp:  [98.2 F (36.8 C)-98.6 F (37 C)] 98.2 F (36.8 C) (02/04 0415) Pulse Rate:  [65-82] 82 (02/04 0415) Cardiac Rhythm: Normal sinus rhythm (02/03 1900) Resp:  [16-20] 20 (02/04 0415) BP: (94-125)/(46-68) 94/46 (02/04 0415) SpO2:  [92 %-99 %] 95 % (02/04 0415) Weight:  [104.5 kg] 104.5 kg (02/04 0415)  Hemodynamic parameters for last 24 hours:    Intake/Output from previous day: No intake/output data recorded. Intake/Output this shift: No intake/output data recorded.  General appearance: alert, cooperative, and no distress Neurologic: intact Heart: regular rate and rhythm, S1, S2 normal, no murmur, click, rub or gallop Lungs: clear to auscultation bilaterally Abdomen: soft, non-tender; bowel sounds normal; no masses,  no organomegaly Extremities: edema trace BLE Wound: Clean and dry without sign of infection  Lab Results: Recent Labs    01/21/25 0341  WBC 9.5  HGB 7.6*  HCT 22.9*  PLT 184   BMET:  Recent Labs    01/21/25 0341 01/22/25 0400  NA 137 139  K 3.9 4.0  CL 104 105  CO2 25 25  GLUCOSE 107* 108*  BUN 23 24*  CREATININE 1.17 1.43*  CALCIUM  8.5* 8.3*    PT/INR: No results for input(s): LABPROT, INR in the last 72 hours. ABG    Component Value Date/Time   PHART 7.424 01/14/2025 1205   HCO3 26.5 01/15/2025 0851   TCO2 28 01/15/2025 0851   ACIDBASEDEF 1.0 01/14/2025 0809   O2SAT 59 01/15/2025 0851   CBG (last 3)  No results for input(s): GLUCAP in the  last 72 hours.  Assessment/Plan: S/P Procedures (LRB): REPLACEMENT, AORTIC VALVE, OPEN UTILIZING 25 MM  INSPIRIS RESILIA  AORTIC VALVE (N/A) ECHOCARDIOGRAM, TRANSESOPHAGEAL, INTRAOPERATIVE (N/A)  Neuro: Postop small posterior left temporocippital region acute ischemic infarct and seizure. Continue Keppra  500mg  BID per neurology until outpatient appointment. No further seizures. No deficits from stroke, strength is equal bilaterally and patient is ambulating well. Takes Percocet 10-325mg  q4h prn at home for chronic pain. Continue scheduled Tylenol  and Oxycodone  10mg  q4h prn. Will discharge on home regimen.    CV: Postop afib, on Lopressor  50mg  BID, Amiodarone  200mg  BID, Eliquis , and ASA 81mg . NSR, HR 60s this AM, been in NSR for about 48 hours now. Soft BP, SBP 90s-100. 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  EKG also showed prolonged QT/Qtcb 492/534, as discussed with Dr. Josip Merolla Amiodarone  was decreased to 200mg  BID, then will transition to Amiodarone  200mg  daily in 7 days. Continue Keppra  until outpatient appointment with them per neurology.    Pulm: Saturating well on RA this AM. CT 02/01 showed bibasilar atelectasis and bilateral moderate to large pleural effusions L>R, underwent left thoracentesis 02/02 yielding 1.1L maroon fluid and right thoracentesis 02/03 yielding 800cc serous fluid. CXR with bibasilar atelectasis and shortness of breath with ambulation. Continue nebs. Encourage IS and ambulation.    GI: +  BM 02/03. Continue Movantik , colace and dulcolax daily. Lactulose  prn. Patient reports black loose stools x 2 now, may need to check hemoccult but could be due iron supplement.   Endo: Preop A1C 5.8. SSI and CBGs have been d/c'd. Patient to follow up with PCP as an outpatient   Renal: Cr 1.43, bumped from 1.17 yesterday. Likely a little dry now following bilateral thoracenteses.  No UO recorded. Trace edema BLE. Under preop weight, will d/c Lasix . K 4.0, at goal.    Expected postop ABLA: Last H/H 7.6/22.9, overall stable. Not clinically significant at this time. Continue iron supplement.   ID: No leukocytosis, afebrile.   DVT Prophylaxis: Lovenox    Dispo: Patient has made excellent progression with PT/OT. Planning for home health PT/OT at discharge. Patient is eager to go home today, may need to keep 1 more day for hemoccult and to monitor creatinine. Daughter is also worried that he is still short of breath with ambulation. Will discuss with Dr. Shyrl.    LOS: 9 days    Con GORMAN Bend, [place] 01/22/2025  Agree Watching creat Dispo planning  Linnie KIDD Damarion Mendizabal  "

## 2025-01-22 NOTE — Progress Notes (Signed)
 Pt and daughter st he has ambulated x2 today. Now resting/sleeping. Reviewed ambulation for home, IS, sternal precautions, and CRPII. Receptive. He can ambulate with his daughter today and apparently PT might come. [id number] Aliene Aris BS, ACSM-CEP 01/22/2025 11:54 AM

## 2025-01-23 ENCOUNTER — Other Ambulatory Visit (HOSPITAL_COMMUNITY): Payer: Self-pay

## 2025-01-23 ENCOUNTER — Inpatient Hospital Stay (HOSPITAL_COMMUNITY)

## 2025-01-23 LAB — CBC
HCT: 24.5 % — ABNORMAL LOW (ref 39.0–52.0)
Hemoglobin: 8.1 g/dL — ABNORMAL LOW (ref 13.0–17.0)
MCH: 32 pg (ref 26.0–34.0)
MCHC: 33.1 g/dL (ref 30.0–36.0)
MCV: 96.8 fL (ref 80.0–100.0)
Platelets: 227 10*3/uL (ref 150–400)
RBC: 2.53 MIL/uL — ABNORMAL LOW (ref 4.22–5.81)
RDW: 16.1 % — ABNORMAL HIGH (ref 11.5–15.5)
WBC: 7.2 10*3/uL (ref 4.0–10.5)
nRBC: 0.4 % — ABNORMAL HIGH (ref 0.0–0.2)

## 2025-01-23 LAB — BASIC METABOLIC PANEL WITH GFR
Anion gap: 8 (ref 5–15)
BUN: 20 mg/dL (ref 8–23)
CO2: 25 mmol/L (ref 22–32)
Calcium: 8.4 mg/dL — ABNORMAL LOW (ref 8.9–10.3)
Chloride: 106 mmol/L (ref 98–111)
Creatinine, Ser: 1.28 mg/dL — ABNORMAL HIGH (ref 0.61–1.24)
GFR, Estimated: >60 mL/min
Glucose, Bld: 102 mg/dL — ABNORMAL HIGH (ref 70–99)
Potassium: 4.6 mmol/L (ref 3.5–5.1)
Sodium: 138 mmol/L (ref 135–145)

## 2025-01-23 MED ORDER — COLCHICINE 0.6 MG PO TABS
0.6000 mg | ORAL_TABLET | Freq: Every day | ORAL | 1 refills | Status: AC
  Filled 2025-01-23: qty 30, 30d supply, fill #0

## 2025-01-23 MED ORDER — METOPROLOL TARTRATE 25 MG PO TABS
37.5000 mg | ORAL_TABLET | Freq: Two times a day (BID) | ORAL | Status: DC
  Administered 2025-01-23: 37.5 mg via ORAL
  Filled 2025-01-23: qty 1

## 2025-01-23 MED ORDER — APIXABAN 5 MG PO TABS
5.0000 mg | ORAL_TABLET | Freq: Two times a day (BID) | ORAL | 1 refills | Status: AC
  Filled 2025-01-23: qty 60, 30d supply, fill #0

## 2025-01-23 MED ORDER — FE FUM-VIT C-VIT B12-FA 460-60-0.01-1 MG PO CAPS: 1.0000 | ORAL_CAPSULE | Freq: Every day | ORAL | Status: AC

## 2025-01-23 MED ORDER — TAMSULOSIN HCL 0.4 MG PO CAPS
0.4000 mg | ORAL_CAPSULE | Freq: Every day | ORAL | 1 refills | Status: AC
  Filled 2025-01-23: qty 30, 30d supply, fill #0

## 2025-01-23 MED ORDER — LEVETIRACETAM 500 MG PO TABS
500.0000 mg | ORAL_TABLET | Freq: Two times a day (BID) | ORAL | 1 refills | Status: AC
  Filled 2025-01-23: qty 60, 30d supply, fill #0

## 2025-01-23 MED ORDER — METOPROLOL TARTRATE 25 MG PO TABS
37.5000 mg | ORAL_TABLET | Freq: Two times a day (BID) | ORAL | 1 refills | Status: AC
  Filled 2025-01-23: qty 90, 30d supply, fill #0

## 2025-01-23 MED ORDER — AMIODARONE HCL 200 MG PO TABS
ORAL_TABLET | ORAL | 1 refills | Status: AC
  Filled 2025-01-23: qty 60, 55d supply, fill #0

## 2025-01-23 NOTE — Care Management Important Message (Signed)
 Important Message  Patient Details  Name: Anthony Paul MRN: [id number] Date of Birth: [birth year]   Important Message Given:  Yes - Medicare IM     Anthony Paul 01/23/2025, 10:31 AM

## 2025-01-23 NOTE — Progress Notes (Signed)
 Occupational Therapy Treatment Patient Details Name: Anthony Paul MRN: [id number] DOB: 1956/07/24 Today's Date: 01/23/2025   History of present illness Pt is 69 yo presenting to [facility] on 1/26 for scheduled surgery. Pt currently s/p AVR on 1/26; extubated 1/27 then had sudden tonic clonic seizures and required re-intubation. Pt extubated 1/29. Possible small CVA PMH: ascending aortia dilatation, bicuspid aortic valve, BPH, chronic abdominal pain, BPH, COPD, DJD, insomnia, lumbar spondylosis, mixed hyperlipidemia, prediabetes, Vit B12   OT comments  Pt making good progress towards OT goals. Pt able to demonstrate the functional skills to manage most ADLs without physical assistance. Pt also able to mobilize around unit using RW with Supervision, endorsing mild SOB but SpO2 100% on RA. Pt does continue to exhibit memory deficits w/ difficulty recalling sternal precautions at start and end of session despite education. Daughter at bedside, supportive. Encouraged purchase of handheld shower head to maximize independence and adherence to sternal precautions during showers. Based on progress, no OT services needed upon DC  HR 82bpm      If plan is discharge home, recommend the following:  A little help with bathing/dressing/bathroom;Assistance with cooking/housework;Assist for transportation;Help with stairs or ramp for entrance   Equipment Recommendations  Other (comment) (RW; handheld shower head for shower)    Recommendations for Other Services      Precautions / Restrictions Precautions Precautions: Fall;Sternal Precaution Booklet Issued: Yes (comment) Recall of Precautions/Restrictions: Impaired Restrictions Weight Bearing Restrictions Per Provider Order: No       Mobility Bed Mobility Overal bed mobility: Modified Independent Bed Mobility: Sit to Supine                Transfers Overall transfer level: Modified independent Equipment used: None Transfers: Sit to/from  Stand Sit to Stand: Modified independent (Device/Increase time)           General transfer comment: able to stand easily placing hands across chest     Balance Overall balance assessment: Needs assistance Sitting-balance support: No upper extremity supported, Feet supported Sitting balance-Leahy Scale: Good     Standing balance support: No upper extremity supported, Bilateral upper extremity supported, During functional activity Standing balance-Leahy Scale: Good                             ADL either performed or assessed with clinical judgement   ADL Overall ADL's : Needs assistance/impaired                     Lower Body Dressing: Supervision/safety;Set up;Sitting/lateral leans;Sit to/from stand               Functional mobility during ADLs: Supervision/safety;Rolling walker (2 wheels) General ADL Comments: Educated on ways to bathe upper body without breaking sternal precautions as well as UB dressing    Extremity/Trunk Assessment Upper Extremity Assessment Upper Extremity Assessment: Overall WFL for tasks assessed   Lower Extremity Assessment Lower Extremity Assessment: Defer to PT evaluation        Vision   Vision Assessment?: No apparent visual deficits   Perception     Praxis     Communication Communication Communication: Impaired Factors Affecting Communication: Hearing impaired   Cognition Arousal: Alert Behavior During Therapy: WFL for tasks assessed/performed Cognition: Cognition impaired       Memory impairment (select all impairments): Short-term memory     OT - Cognition Comments: pleasant, unable to recall all sternal precautions at start or at end of  session despite education.                 Following commands: Intact        Cueing   Cueing Techniques: Verbal cues  Exercises      Shoulder Instructions       General Comments Daughter at bedside    Pertinent Vitals/ Pain       Pain  Assessment Pain Assessment: No/denies pain  Home Living                                          Prior Functioning/Environment              Frequency  Min 2X/week        Progress Toward Goals  OT Goals(current goals can now be found in the care plan section)  Progress towards OT goals: Progressing toward goals  Acute Rehab OT Goals Patient Stated Goal: go home today OT Goal Formulation: With patient Time For Goal Achievement: 01/30/25 Potential to Achieve Goals: Good  Plan      Co-evaluation                 AM-PAC OT 6 Clicks Daily Activity     Outcome Measure   Help from another person eating meals?: None Help from another person taking care of personal grooming?: A Little Help from another person toileting, which includes using toliet, bedpan, or urinal?: A Little Help from another person bathing (including washing, rinsing, drying)?: A Little Help from another person to put on and taking off regular upper body clothing?: A Little Help from another person to put on and taking off regular lower body clothing?: A Little 6 Click Score: 19    End of Session Equipment Utilized During Treatment: Rolling walker (2 wheels);Gait belt  OT Visit Diagnosis: Unsteadiness on feet (R26.81);Muscle weakness (generalized) (M62.81)   Activity Tolerance Patient tolerated treatment well   Patient Left in bed;with call bell/phone within reach;with family/visitor present   Nurse Communication          Time: 9093-9079 OT Time Calculation (min): 14 min  Charges: OT General Charges $OT Visit: 1 Visit OT Treatments $Therapeutic Activity: 8-22 mins  Mliss NOVAK, OTR/L Acute Rehab Services Office: [phone]   Mliss Fish 01/23/2025, 10:33 AM

## 2025-01-23 NOTE — TOC Transition Note (Signed)
 Transition of Care (TOC) - Discharge Note Rayfield Gobble RN, BSN Inpatient Care Management Unit 4E- RN Case Manager See Treatment Team for direct phone #   Patient Details  Name: Anthony Paul MRN: [id number] Date of Birth: Aug 23, 1956  Transition of Care [facility]) CM/SW Contact:  Gobble Rayfield Hurst, RN Phone Number: 01/23/2025, 11:55 AM   Clinical Narrative:    Pt stable for transition home today. Family to transport home.   DME- RW set up w/ [employer]- delivered  HHPT/OT- set up w/ [facility]- liaison updated on discharge for start of care  IP CM interventions have been completed no further needs noted.   Final next level of care: Home w Home Health Services Barriers to Discharge: Barriers Resolved   Patient Goals and CMS Choice Patient states their goals for this hospitalization and ongoing recovery are:: return home   Choice offered to / list presented to : Patient      Discharge Placement               Home w/ [facility]        Discharge Plan and Services Additional resources added to the After Visit Summary for   In-house Referral: NA Discharge Planning Services: CM Consult Post Acute Care Choice: Durable Medical Equipment, Home Health          DME Arranged: Bedside commode, Walker rolling DME Agency: [facility] Date DME Agency Contacted: 01/21/25 Time DME Agency Contacted: [id number] Representative spoke with at DME Agency: Zack HH Arranged: PT, OT HH Agency: [facility] Date HH Agency Contacted: 01/19/25   Representative spoke with at [facility] Agency: [facility]  Social Drivers of Health (SDOH) Interventions SDOH Screenings   Food Insecurity: No Food Insecurity (01/18/2025)  Housing: Low Risk (01/18/2025)  Transportation Needs: No Transportation Needs (01/18/2025)  Utilities: Not At Risk (01/18/2025)  Depression (PHQ2-9): Low Risk (01/01/2025)  Physical Activity: Inactive (01/01/2025)  Social Connections: Socially Integrated (01/18/2025)  Stress: No Stress  Concern Present (01/01/2025)  Tobacco Use: High Risk (01/13/2025)  Health Literacy: Adequate Health Literacy (01/01/2025)     Readmission Risk Interventions    01/21/2025    1:00 PM  Readmission Risk Prevention Plan  Post Dischage Appt Complete  Medication Screening Complete  Transportation Screening Complete

## 2025-01-23 NOTE — Progress Notes (Signed)
 "     [address] Zone [place] [address]             [phone]      10 Days Post-Op Procedures (LRB): REPLACEMENT, AORTIC VALVE, OPEN UTILIZING 25 MM  INSPIRIS RESILIA  AORTIC VALVE (N/A) ECHOCARDIOGRAM, TRANSESOPHAGEAL, INTRAOPERATIVE (N/A) Subjective: Patient reports he had some dizziness this AM getting out of bed and was not able to go get his CXR. Patient still feels he is ready to go home.   Objective: Vital signs in last 24 hours: Temp:  [97.8 F (36.6 C)-98.4 F (36.9 C)] 98.1 F (36.7 C) (02/05 0516) Pulse Rate:  [60-71] 60 (02/05 0516) Cardiac Rhythm: Normal sinus rhythm (02/05 0405) Resp:  [10-20] 19 (02/05 0516) BP: (96-116)/(56-79) 97/62 (02/05 0516) SpO2:  [93 %-100 %] 95 % (02/05 0005) Weight:  [104.4 kg] 104.4 kg (02/05 0516)  Hemodynamic parameters for last 24 hours:    Intake/Output from previous day: 02/04 0701 - 02/05 0700 In: 440 [P.O.:440] Out: -  Intake/Output this shift: No intake/output data recorded.  General appearance: alert, cooperative, and no distress Neurologic: intact Heart: regular rate and rhythm, S1, S2 normal, no murmur, click, rub or gallop Lungs: clear to auscultation bilaterally Abdomen: soft, non-tender; bowel sounds normal; no masses,  no organomegaly Extremities: edema trace-1+ edema BLE Wound: Clean and dry without sign of infection  Lab Results: Recent Labs    01/21/25 0341 01/23/25 0319  WBC 9.5 7.2  HGB 7.6* 8.1*  HCT 22.9* 24.5*  PLT 184 227   BMET:  Recent Labs    01/22/25 0400 01/23/25 0319  NA 139 138  K 4.0 4.6  CL 105 106  CO2 25 25  GLUCOSE 108* 102*  BUN 24* 20  CREATININE 1.43* 1.28*  CALCIUM  8.3* 8.4*    PT/INR: No results for input(s): LABPROT, INR in the last 72 hours. ABG    Component Value Date/Time   PHART 7.424 01/14/2025 1205   HCO3 26.5 01/15/2025 0851   TCO2 28 01/15/2025 0851   ACIDBASEDEF 1.0 01/14/2025 0809   O2SAT 59 01/15/2025 0851   CBG (last  3)  No results for input(s): GLUCAP in the last 72 hours.  Assessment/Plan: S/P Procedures (LRB): REPLACEMENT, AORTIC VALVE, OPEN UTILIZING 25 MM  INSPIRIS RESILIA  AORTIC VALVE (N/A) ECHOCARDIOGRAM, TRANSESOPHAGEAL, INTRAOPERATIVE (N/A)  Neuro: Postop small posterior left temporocippital region acute ischemic infarct and seizure. Continue Keppra  500mg  BID per neurology until outpatient appointment. No further seizures. No deficits from stroke, strength is equal bilaterally and patient is ambulating well. Takes Percocet 10-325mg  q4h prn at home for chronic pain. Continue scheduled Tylenol  and Oxycodone  10mg  q4h prn. Will discharge on home regimen.    CV: Postop afib, on Lopressor  50mg  BID, Amiodarone  200mg  BID, Eliquis , and ASA 81mg . NSR, HR 60s this AM. Soft BP, SBP 90s-100. Dizziness likely due to hypotension this AM, will decrease Lopressor  dose. 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  EKG also showed prolonged QT/Qtcb 492/534 but measuring 450-460 on tele this AM. As discussed with Dr. Lightfoot Amiodarone  was decreased to 200mg  BID. Continue Keppra  until outpatient appointment with them per neurology.    Pulm: Saturating well on RA this AM. CT 02/01 showed bibasilar atelectasis and bilateral moderate to large pleural effusions L>R, underwent left thoracentesis 02/02 yielding 1.1L maroon fluid and right thoracentesis 02/03 yielding 800cc serous fluid. Last CXR with bibasilar  atelectasis and shortness of breath with ambulation but has improved since thoracenteses, still needs CXR this AM. Continue nebs. Encourage IS and ambulation.    GI: +BM 02/03. Continue Movantik , colace and dulcolax daily. Lactulose  prn. Patient reports black loose stools, likely due to iron supplement. Will not collect hemoccult per Dr. Shyrl.    Endo: Preop A1C 5.8. SSI and CBGs have been d/c'd. Patient to  follow up with PCP as an outpatient   Renal: Cr 1.28, down from 1.43 yesterday. Likely a little dry following bilateral thoracenteses. No UO recorded. Trace edema BLE. Under preop weight, Lasix  has been d/c'd. K 4.6, at goal.    Expected postop ABLA: Last H/H 8.1/24.5, improving. Not clinically significant at this time. Continue iron supplement.   ID: No leukocytosis, afebrile.   DVT Prophylaxis: Lovenox    Dispo: Patient has made excellent progression with PT/OT. Planning for home health PT/OT at discharge. Plan to d/c home today after CXR and ambulation if no further dizziness.    LOS: 10 days    Con GORMAN Bend, PA-C 01/23/2025   "

## 2025-01-24 ENCOUNTER — Other Ambulatory Visit (HOSPITAL_COMMUNITY): Payer: Self-pay

## 2025-01-24 ENCOUNTER — Telehealth: Payer: Self-pay

## 2025-01-24 ENCOUNTER — Telehealth (HOSPITAL_COMMUNITY): Payer: Self-pay

## 2025-01-24 NOTE — Telephone Encounter (Signed)
 Pt insurance is active and benefits verified through Medicare a/b Co-pay 0, DED $283/$283 met, out of pocket 0/0 met, co-insurance 20%. no pre-authorization required. Passport, 01/24/25@10 :4, REF# [id number]  2ndary insurance is active and benefits verified through [employer]. Co-pay 0, DED 0/0 met, out of pocket 0/0 met, co-insurance 0%. No pre-authorization required.   TCR/ICR? ICR Visit(date of service)limitation? 36 Can multiple codes be used on the same date of service/visit?(IF ITS A LIMIT) yes  Is this a lifetime maximum or an annual maximum? lifetime Has the member used any of these services to date? no Is there a time limit (weeks/months) on start of program and/or program completion? no  Will contact patient to see if he is interested in the Cardiac Rehab Program. If interested, patient will need to complete follow up appt. Once completed, patient will be contacted for scheduling upon review by the RN Navigator.

## 2025-01-24 NOTE — Transitions of Care (Post Inpatient/ED Visit) (Signed)
 "  01/24/2025  Name: Anthony Paul MRN: [id number] DOB: 1956/04/28  Today's TOC FU Call Status: Today's TOC FU Call Status:: Successful TOC FU Call Completed TOC FU Call Complete Date: 01/24/25  Patient's Name and Date of Birth confirmed. Name, DOB  Transition Care Management Follow-up Telephone Call Date of Discharge: 01/23/25 Discharge Facility: [facility]) Type of Discharge: Inpatient Admission Primary Inpatient Discharge Diagnosis:: S/P AVR (aortic valve replacement) How have you been since you were released from the hospital?: Better Any questions or concerns?: No  Items Reviewed: Did you receive and understand the discharge instructions provided?: Yes Medications obtained,verified, and reconciled?: Yes (Medications Reviewed) Any new allergies since your discharge?: No Dietary orders reviewed?: Yes Type of Diet Ordered:: Heart Healthy low sodium Do you have support at home?: Yes People in Home [RPT]: spouse Name of Support/Comfort Primary Source: Doyal, spouse  Medications Reviewed Today: Medications Reviewed Today     Reviewed by Eilleen Richerd GRADE, RN (Registered Nurse) on 01/24/25 at 1341  Med List Status: <None>   Medication Order Taking? Sig Documenting Provider Last Dose Status Informant  albuterol  (VENTOLIN  HFA) 108 (90 Base) MCG/ACT inhaler [id number]  INHALE 2 PUFFS INTO THE LUNGS EVERY 4 (FOUR) HOURS AS NEEDED FOR WHEEZING OR SHORTNESS OF BREATH. PLEASE GIVE GENERIC OR THE ONE THAT INSURANCE COVERS Wilkinson, Dana E, NP  Active Self  amiodarone  (PACERONE ) 200 MG tablet [id number] Yes Take 1 tablet by mouth twice per day for 5 days then take 1 tablet daily thereafter Raguel Con RAMAN, PA-C  Active   apixaban  (ELIQUIS ) 5 MG TABS tablet [id number] Yes Take 1 tablet (5 mg total) by mouth 2 (two) times daily. Raguel Con RAMAN DEVONNA  Active   aspirin  EC 81 MG tablet [id number] Yes Take 81 mg by mouth every evening. [provider]  Active Self  atorvastatin   (LIPITOR) 40 MG tablet [id number] Yes Take 1 tablet (40 mg total) by mouth every evening. Please keep upcoming appointment in February for future refills. Thank you. Nishan, Peter C, MD  Active   calcium  carbonate (TUMS - DOSED IN MG ELEMENTAL CALCIUM ) 500 MG chewable tablet [id number] Yes Chew 1-2 tablets by mouth every evening. [provider]  Active Self  colchicine  0.6 MG tablet [id number] Yes Take 1 tablet (0.6 mg total) by mouth daily. Raguel Con RAMAN, PA-C  Active   Cyanocobalamin  (VITAMIN B12 PO) [id number] Yes Take 1,000 mcg by mouth every evening. [provider]  Active Self  Fe Fum-Vit C-Vit B12-FA (TRIGELS-F FORTE) CAPS capsule [id number] Yes Take 1 capsule by mouth daily after breakfast. May take similar generic medication Raguel Con RAMAN, PA-C  Active   icosapent  Ethyl (VASCEPA ) 1 g capsule [id number] Yes Take 2 capsules (2 g total) by mouth 2 (two) times daily. Nishan, Peter C, MD  Active Self  levETIRAcetam  (KEPPRA ) 500 MG tablet [id number] Yes Take 1 tablet (500 mg total) by mouth 2 (two) times daily. Raguel Con RAMAN, PA-C  Active   metoprolol  tartrate (LOPRESSOR ) 25 MG tablet [id number] Yes Take 1.5 tablets (37.5 mg total) by mouth 2 (two) times daily. Raguel Con RAMAN DEVONNA  Active   MOVANTIK  25 MG TABS tablet [id number] Yes TAKE 1 TABLET DAILY FOR REGULAR BM'S McClanahan, Kyra, NP  Active Self  oxyCODONE -acetaminophen  (PERCOCET) 10-325 MG tablet [id number] Yes Take 1 tablet by mouth every 4 (four) hours as needed for pain. [provider]  Active Self  tamsulosin  (FLOMAX ) 0.4 MG CAPS capsule [id number] Yes Take 1 capsule (0.4 mg  total) by mouth daily. Raguel Con RAMAN, [place]  Active   traZODone  (DESYREL ) 150 MG tablet [id number]  Take      1/2 to 1 tablet       1 hour      before Bedtime for Sleep  Patient taking differently: Take 150 mg by mouth at bedtime.   McGowen, Philip H, MD  Active Self  VITAMIN D  PO [id number] Yes Take 1,000 Units by mouth daily  in the afternoon. [provider]  Active Self           Med Note CARLON, NATASHA   Fri Oct 20, 2017 10:10 AM)              Home Care and Equipment/Supplies: Were Home Health Services Ordered?: Yes Name of Home Health Agency:: [facility] Has Agency set up a time to come to your home?: Yes First Home Health Visit Date: 01/24/25 Any new equipment or medical supplies ordered?: Yes Name of Medical supply agency?: [employer] (rolling walker) Were you able to get the equipment/medical supplies?: Yes Do you have any questions related to the use of the equipment/supplies?: No  Functional Questionnaire: Do you need assistance with bathing/showering or dressing?: Yes (a little stand by assistance) Do you need assistance with meal preparation?: Yes (wife) Do you need assistance with eating?: No Do you have difficulty maintaining continence: No Do you need assistance with getting out of bed/getting out of a chair/moving?: No Do you have difficulty managing or taking your medications?: No  Follow up appointments reviewed: PCP Follow-up appointment confirmed?: No (Wife/patient to call, need to get appointments for 01/31/25 fixed first) Specialist Hospital Follow-up appointment confirmed?: Yes Date of Specialist follow-up appointment?: 01/31/25 Follow-Up Specialty Provider:: Cardiology and Cardio- Vascular Do you need transportation to your follow-up appointment?: No Do you understand care options if your condition(s) worsen?: Yes-patient verbalized understanding  SDOH Interventions Today    Flowsheet Row Most Recent Value  SDOH Interventions   Food Insecurity Interventions Intervention Not Indicated  Housing Interventions Intervention Not Indicated  Transportation Interventions Intervention Not Indicated  Utilities Interventions Intervention Not Indicated   Education for self-mgmt of s/p AVR provided during this outreach regarding: -s/s of worsening condition and when to  seek medical attention -importance of completing all post discharge hospital hospital follow up appts -adherence to med regimen [facility] TOC 30-day program enrollment reviewed and discussed with pt/caregiver. They have declined enrollment in 30 day TOC program due to:  feels he has home health and post hospital appointments, encourage to contact PCP if changes his mind, thanked this clinical research associate for follow up call.  Richerd Fish, RN, BSN, CCM [facility], [facility] Health RN Care Manager Direct Dial: [phone]        "

## 2025-01-24 NOTE — Telephone Encounter (Signed)
 Called patient to see if he is interested in the Cardiac Rehab Program. Patient expressed interest. Explained scheduling process and went over insurance, patient verbalized understanding. Will contact patient for scheduling once f/u has been completed.

## 2025-01-31 ENCOUNTER — Telehealth: Admitting: Thoracic Surgery (Cardiothoracic Vascular Surgery)

## 2025-01-31 ENCOUNTER — Ambulatory Visit: Admitting: Physician Assistant

## 2025-02-07 ENCOUNTER — Inpatient Hospital Stay: Admitting: Family Medicine

## 2025-02-24 ENCOUNTER — Ambulatory Visit (HOSPITAL_COMMUNITY)

## 2025-05-21 ENCOUNTER — Ambulatory Visit: Admitting: Family Medicine
